# Patient Record
Sex: Female | Born: 1945 | Race: Black or African American | Hispanic: No | Marital: Single | Smoking: Never smoker
Health system: Southern US, Community
[De-identification: ages and names within clinical notes are randomized; demographics above are authoritative.]

## PROBLEM LIST (undated history)

## (undated) DIAGNOSIS — G629 Polyneuropathy, unspecified: Secondary | ICD-10-CM

## (undated) DIAGNOSIS — I10 Essential (primary) hypertension: Secondary | ICD-10-CM

## (undated) DIAGNOSIS — R7303 Prediabetes: Secondary | ICD-10-CM

## (undated) DIAGNOSIS — R29818 Other symptoms and signs involving the nervous system: Secondary | ICD-10-CM

## (undated) DIAGNOSIS — J449 Chronic obstructive pulmonary disease, unspecified: Secondary | ICD-10-CM

## (undated) DIAGNOSIS — R5381 Other malaise: Secondary | ICD-10-CM

## (undated) DIAGNOSIS — K589 Irritable bowel syndrome without diarrhea: Secondary | ICD-10-CM

## (undated) DIAGNOSIS — K7290 Hepatic failure, unspecified without coma: Secondary | ICD-10-CM

## (undated) DIAGNOSIS — I4891 Unspecified atrial fibrillation: Secondary | ICD-10-CM

## (undated) DIAGNOSIS — I739 Peripheral vascular disease, unspecified: Secondary | ICD-10-CM

## (undated) DIAGNOSIS — M1990 Unspecified osteoarthritis, unspecified site: Secondary | ICD-10-CM

## (undated) DIAGNOSIS — G9340 Encephalopathy, unspecified: Secondary | ICD-10-CM

## (undated) DIAGNOSIS — J9622 Acute and chronic respiratory failure with hypercapnia: Secondary | ICD-10-CM

## (undated) HISTORY — DX: Acute and chronic respiratory failure with hypercapnia: J96.22

## (undated) HISTORY — DX: Encephalopathy, unspecified: G93.40

---

## 2018-03-28 ENCOUNTER — Other Ambulatory Visit: Payer: Self-pay | Admitting: Internal Medicine

## 2018-03-28 DIAGNOSIS — Z1231 Encounter for screening mammogram for malignant neoplasm of breast: Secondary | ICD-10-CM

## 2018-05-11 ENCOUNTER — Other Ambulatory Visit: Payer: Self-pay

## 2018-05-11 ENCOUNTER — Inpatient Hospital Stay (HOSPITAL_COMMUNITY)
Admission: EM | Admit: 2018-05-11 | Discharge: 2018-05-19 | DRG: 205 | Disposition: A | Discharge status: Home Health | Payer: Medicare Other | Attending: Internal Medicine | Admitting: Internal Medicine

## 2018-05-11 ENCOUNTER — Encounter (HOSPITAL_COMMUNITY): Payer: Self-pay | Admitting: Emergency Medicine

## 2018-05-11 ENCOUNTER — Emergency Department (HOSPITAL_COMMUNITY): Payer: Medicare Other

## 2018-05-11 DIAGNOSIS — J9622 Acute and chronic respiratory failure with hypercapnia: Secondary | ICD-10-CM | POA: Diagnosis present

## 2018-05-11 DIAGNOSIS — R7989 Other specified abnormal findings of blood chemistry: Secondary | ICD-10-CM | POA: Diagnosis present

## 2018-05-11 DIAGNOSIS — R4701 Aphasia: Secondary | ICD-10-CM | POA: Diagnosis not present

## 2018-05-11 DIAGNOSIS — Z87891 Personal history of nicotine dependence: Secondary | ICD-10-CM

## 2018-05-11 DIAGNOSIS — I4821 Permanent atrial fibrillation: Secondary | ICD-10-CM | POA: Diagnosis present

## 2018-05-11 DIAGNOSIS — I4891 Unspecified atrial fibrillation: Secondary | ICD-10-CM | POA: Diagnosis not present

## 2018-05-11 DIAGNOSIS — E872 Acidosis: Secondary | ICD-10-CM | POA: Diagnosis present

## 2018-05-11 DIAGNOSIS — Z6841 Body Mass Index (BMI) 40.0 and over, adult: Secondary | ICD-10-CM | POA: Diagnosis not present

## 2018-05-11 DIAGNOSIS — L309 Dermatitis, unspecified: Secondary | ICD-10-CM | POA: Diagnosis present

## 2018-05-11 DIAGNOSIS — I081 Rheumatic disorders of both mitral and tricuspid valves: Secondary | ICD-10-CM | POA: Diagnosis present

## 2018-05-11 DIAGNOSIS — I959 Hypotension, unspecified: Secondary | ICD-10-CM | POA: Diagnosis not present

## 2018-05-11 DIAGNOSIS — M7989 Other specified soft tissue disorders: Secondary | ICD-10-CM | POA: Diagnosis not present

## 2018-05-11 DIAGNOSIS — N179 Acute kidney failure, unspecified: Secondary | ICD-10-CM

## 2018-05-11 DIAGNOSIS — I5041 Acute combined systolic (congestive) and diastolic (congestive) heart failure: Secondary | ICD-10-CM | POA: Diagnosis present

## 2018-05-11 DIAGNOSIS — R32 Unspecified urinary incontinence: Secondary | ICD-10-CM | POA: Diagnosis present

## 2018-05-11 DIAGNOSIS — N184 Chronic kidney disease, stage 4 (severe): Secondary | ICD-10-CM | POA: Diagnosis present

## 2018-05-11 DIAGNOSIS — J9621 Acute and chronic respiratory failure with hypoxia: Secondary | ICD-10-CM | POA: Diagnosis present

## 2018-05-11 DIAGNOSIS — I130 Hypertensive heart and chronic kidney disease with heart failure and stage 1 through stage 4 chronic kidney disease, or unspecified chronic kidney disease: Secondary | ICD-10-CM | POA: Diagnosis present

## 2018-05-11 DIAGNOSIS — J9602 Acute respiratory failure with hypercapnia: Secondary | ICD-10-CM

## 2018-05-11 DIAGNOSIS — N183 Chronic kidney disease, stage 3 unspecified: Secondary | ICD-10-CM

## 2018-05-11 DIAGNOSIS — I5043 Acute on chronic combined systolic (congestive) and diastolic (congestive) heart failure: Secondary | ICD-10-CM | POA: Diagnosis not present

## 2018-05-11 DIAGNOSIS — I5023 Acute on chronic systolic (congestive) heart failure: Secondary | ICD-10-CM | POA: Diagnosis not present

## 2018-05-11 DIAGNOSIS — R202 Paresthesia of skin: Secondary | ICD-10-CM | POA: Diagnosis not present

## 2018-05-11 DIAGNOSIS — J9601 Acute respiratory failure with hypoxia: Secondary | ICD-10-CM | POA: Diagnosis not present

## 2018-05-11 DIAGNOSIS — I5021 Acute systolic (congestive) heart failure: Secondary | ICD-10-CM | POA: Diagnosis not present

## 2018-05-11 DIAGNOSIS — Z79899 Other long term (current) drug therapy: Secondary | ICD-10-CM | POA: Diagnosis not present

## 2018-05-11 DIAGNOSIS — R778 Other specified abnormalities of plasma proteins: Secondary | ICD-10-CM | POA: Diagnosis present

## 2018-05-11 DIAGNOSIS — I272 Pulmonary hypertension, unspecified: Secondary | ICD-10-CM | POA: Diagnosis present

## 2018-05-11 DIAGNOSIS — E66813 Obesity, class 3: Secondary | ICD-10-CM | POA: Diagnosis present

## 2018-05-11 DIAGNOSIS — N289 Disorder of kidney and ureter, unspecified: Secondary | ICD-10-CM | POA: Diagnosis not present

## 2018-05-11 DIAGNOSIS — I10 Essential (primary) hypertension: Secondary | ICD-10-CM | POA: Diagnosis not present

## 2018-05-11 DIAGNOSIS — I248 Other forms of acute ischemic heart disease: Secondary | ICD-10-CM | POA: Diagnosis present

## 2018-05-11 DIAGNOSIS — R609 Edema, unspecified: Secondary | ICD-10-CM

## 2018-05-11 DIAGNOSIS — L8990 Pressure ulcer of unspecified site, unspecified stage: Secondary | ICD-10-CM

## 2018-05-11 DIAGNOSIS — E662 Morbid (severe) obesity with alveolar hypoventilation: Secondary | ICD-10-CM | POA: Diagnosis present

## 2018-05-11 DIAGNOSIS — I509 Heart failure, unspecified: Secondary | ICD-10-CM

## 2018-05-11 DIAGNOSIS — R0602 Shortness of breath: Secondary | ICD-10-CM

## 2018-05-11 DIAGNOSIS — R627 Adult failure to thrive: Secondary | ICD-10-CM | POA: Diagnosis present

## 2018-05-11 HISTORY — DX: Morbid (severe) obesity due to excess calories: E66.01

## 2018-05-11 HISTORY — DX: Essential (primary) hypertension: I10

## 2018-05-11 HISTORY — DX: Other symptoms and signs involving the nervous system: R29.818

## 2018-05-11 HISTORY — DX: Other specified abnormal findings of blood chemistry: R79.89

## 2018-05-11 HISTORY — DX: Acute respiratory failure with hypoxia: J96.01

## 2018-05-11 HISTORY — DX: Other malaise: R53.81

## 2018-05-11 LAB — COMPREHENSIVE METABOLIC PANEL
ALT: 30 U/L (ref 0–44)
AST: 46 U/L — ABNORMAL HIGH (ref 15–41)
Albumin: 3.4 g/dL — ABNORMAL LOW (ref 3.5–5.0)
Alkaline Phosphatase: 66 U/L (ref 38–126)
Anion gap: 9 (ref 5–15)
BUN: 36 mg/dL — ABNORMAL HIGH (ref 8–23)
CALCIUM: 9 mg/dL (ref 8.9–10.3)
CO2: 29 mmol/L (ref 22–32)
Chloride: 104 mmol/L (ref 98–111)
Creatinine, Ser: 2.21 mg/dL — ABNORMAL HIGH (ref 0.44–1.00)
GFR calc Af Amer: 25 mL/min — ABNORMAL LOW (ref >60)
GFR calc non Af Amer: 21 mL/min — ABNORMAL LOW (ref >60)
Glucose, Bld: 100 mg/dL — ABNORMAL HIGH (ref 70–99)
Potassium: 4.3 mmol/L (ref 3.5–5.1)
Sodium: 142 mmol/L (ref 135–145)
Total Bilirubin: 1.2 mg/dL (ref 0.3–1.2)
Total Protein: 6.6 g/dL (ref 6.5–8.1)

## 2018-05-11 LAB — CBC WITH DIFFERENTIAL/PLATELET
Abs Immature Granulocytes: 0.04 10*3/uL (ref 0.00–0.07)
Basophils Absolute: 0 10*3/uL (ref 0.0–0.1)
Basophils Relative: 0 %
Eosinophils Absolute: 0 10*3/uL (ref 0.0–0.5)
Eosinophils Relative: 1 %
HCT: 42.2 % (ref 36.0–46.0)
Hemoglobin: 13.1 g/dL (ref 12.0–15.0)
Immature Granulocytes: 1 %
LYMPHS ABS: 0.6 10*3/uL — AB (ref 0.7–4.0)
Lymphocytes Relative: 7 %
MCH: 31.5 pg (ref 26.0–34.0)
MCHC: 31 g/dL (ref 30.0–36.0)
MCV: 101.4 fL — ABNORMAL HIGH (ref 80.0–100.0)
Monocytes Absolute: 0.9 10*3/uL (ref 0.1–1.0)
Monocytes Relative: 12 %
Neutro Abs: 6.3 10*3/uL (ref 1.7–7.7)
Neutrophils Relative %: 79 %
Platelets: 110 10*3/uL — ABNORMAL LOW (ref 150–400)
RBC: 4.16 MIL/uL (ref 3.87–5.11)
RDW: 15.1 % (ref 11.5–15.5)
WBC: 7.9 10*3/uL (ref 4.0–10.5)
nRBC: 0 % (ref 0.0–0.2)

## 2018-05-11 LAB — LIPID PANEL
Cholesterol: 119 mg/dL (ref 0–200)
HDL: 41 mg/dL (ref >40)
LDL CALC: 66 mg/dL (ref 0–99)
Total CHOL/HDL Ratio: 2.9 RATIO
Triglycerides: 62 mg/dL (ref <150)
VLDL: 12 mg/dL (ref 0–40)

## 2018-05-11 LAB — POCT I-STAT 7, (LYTES, BLD GAS, ICA,H+H)
Acid-Base Excess: 4 mmol/L — ABNORMAL HIGH (ref 0.0–2.0)
Bicarbonate: 32.6 mmol/L — ABNORMAL HIGH (ref 20.0–28.0)
Calcium, Ion: 1.25 mmol/L (ref 1.15–1.40)
HCT: 38 % (ref 36.0–46.0)
Hemoglobin: 12.9 g/dL (ref 12.0–15.0)
O2 Saturation: 94 %
POTASSIUM: 4.4 mmol/L (ref 3.5–5.1)
Patient temperature: 97.9
Sodium: 141 mmol/L (ref 135–145)
TCO2: 35 mmol/L — ABNORMAL HIGH (ref 22–32)
pCO2 arterial: 64.8 mmHg — ABNORMAL HIGH (ref 32.0–48.0)
pH, Arterial: 7.307 — ABNORMAL LOW (ref 7.350–7.450)
pO2, Arterial: 77 mmHg — ABNORMAL LOW (ref 83.0–108.0)

## 2018-05-11 LAB — URINALYSIS, ROUTINE W REFLEX MICROSCOPIC
Glucose, UA: NEGATIVE mg/dL
Ketones, ur: NEGATIVE mg/dL
Nitrite: NEGATIVE
Protein, ur: 30 mg/dL — AB
Specific Gravity, Urine: 1.026 (ref 1.005–1.030)
pH: 5 (ref 5.0–8.0)

## 2018-05-11 LAB — TROPONIN I
TROPONIN I: 0.06 ng/mL — AB (ref <0.03)
TROPONIN I: 0.07 ng/mL — AB (ref <0.03)
Troponin I: 0.06 ng/mL (ref <0.03)

## 2018-05-11 LAB — HEMOGLOBIN A1C
Hgb A1c MFr Bld: 6 % — ABNORMAL HIGH (ref 4.8–5.6)
Mean Plasma Glucose: 125.5 mg/dL

## 2018-05-11 LAB — TSH: TSH: 1.166 u[IU]/mL (ref 0.350–4.500)

## 2018-05-11 LAB — BRAIN NATRIURETIC PEPTIDE: B Natriuretic Peptide: 309.8 pg/mL — ABNORMAL HIGH (ref 0.0–100.0)

## 2018-05-11 MED ORDER — ASPIRIN EC 81 MG PO TBEC
81.0000 mg | DELAYED_RELEASE_TABLET | Freq: Every day | ORAL | Status: DC
  Administered 2018-05-12 – 2018-05-19 (×8): 81 mg via ORAL
  Filled 2018-05-11 (×8): qty 1

## 2018-05-11 MED ORDER — ENOXAPARIN SODIUM 40 MG/0.4ML ~~LOC~~ SOLN: 40.0000 mg | SUBCUTANEOUS | Status: DC

## 2018-05-11 MED ORDER — METOPROLOL TARTRATE 25 MG PO TABS
25.0000 mg | ORAL_TABLET | Freq: Two times a day (BID) | ORAL | Status: DC
  Administered 2018-05-11 – 2018-05-14 (×5): 25 mg via ORAL
  Filled 2018-05-11 (×6): qty 1

## 2018-05-11 MED ORDER — NYSTATIN 100000 UNIT/GM EX CREA
TOPICAL_CREAM | Freq: Two times a day (BID) | CUTANEOUS | Status: DC
  Administered 2018-05-12: 1 via TOPICAL
  Administered 2018-05-12: 03:00:00 via TOPICAL
  Administered 2018-05-13: 1 via TOPICAL
  Administered 2018-05-14 – 2018-05-18 (×10): via TOPICAL
  Filled 2018-05-11 (×2): qty 15

## 2018-05-11 MED ORDER — DOCUSATE SODIUM 100 MG PO CAPS: 200.0000 mg | ORAL_CAPSULE | Freq: Every day | ORAL | Status: DC | PRN

## 2018-05-11 MED ORDER — ENOXAPARIN SODIUM 300 MG/3ML IJ SOLN
200.0000 mg | Freq: Two times a day (BID) | INTRAMUSCULAR | Status: DC
  Administered 2018-05-11 – 2018-05-12 (×2): 200 mg via SUBCUTANEOUS
  Filled 2018-05-11 (×4): qty 2

## 2018-05-11 MED ORDER — ALBUTEROL SULFATE (2.5 MG/3ML) 0.083% IN NEBU: 2.5000 mg | INHALATION_SOLUTION | RESPIRATORY_TRACT | Status: DC | PRN

## 2018-05-11 MED ORDER — DILTIAZEM HCL-DEXTROSE 100-5 MG/100ML-% IV SOLN (PREMIX)
5.0000 mg/h | INTRAVENOUS | Status: DC
  Administered 2018-05-11: 10 mg/h via INTRAVENOUS
  Administered 2018-05-11 – 2018-05-12 (×2): 5 mg/h via INTRAVENOUS
  Filled 2018-05-11 (×3): qty 100

## 2018-05-11 MED ORDER — SODIUM CHLORIDE 0.9 % IV BOLUS: 1000.0000 mL | Freq: Once | INTRAVENOUS | Status: DC

## 2018-05-11 MED ORDER — ACETAMINOPHEN 500 MG PO TABS
1000.0000 mg | ORAL_TABLET | Freq: Four times a day (QID) | ORAL | Status: DC | PRN
  Administered 2018-05-16 – 2018-05-17 (×2): 1000 mg via ORAL
  Filled 2018-05-11 (×2): qty 2

## 2018-05-11 MED ORDER — HYDROCODONE-ACETAMINOPHEN 5-325 MG PO TABS
1.0000 | ORAL_TABLET | ORAL | Status: DC | PRN
  Administered 2018-05-18 – 2018-05-19 (×3): 2 via ORAL
  Filled 2018-05-11 (×3): qty 2

## 2018-05-11 NOTE — H&P (Signed)
History and Physical    [name]:[id number] DOB: Apr 30, 1945 DOA: 05/11/2018  PCP: Jean Stewart, No Pcp Per , Jean Stewart says she just made a pcp. Jean Stewart coming from: home   I have personally briefly reviewed Jean Stewart's old medical records available.   Chief Complaint: Increasing leg swelling since 1 week, worse now.  HPI: Jean Stewart is a 73 y.o. female with medical history significant of hypertension, limited history who presents to the emergency room with increasing leg swelling and pain for 1 week.  Jean Stewart is poor historian.  According to the Jean Stewart, she used to live in [place] and moved to [place] last June.  She states she has history of hypertension and takes medicine.  Denies any history of use of oxygen, sleep apnea or use of CPAP.  Denies any history of heart problems or A. fib.  Jean Stewart called EMS because she could not walk and she lives by herself and her legs are increasingly swollen.  She does not know what is her baseline weight.Her only complaint is her left thigh hurts, her legs are swollen.  Today she had difficulty walking so she called EMS.  At baseline she walks with a walker.  When EMS found her, she was in A. fib with RVR. Jean Stewart denies any chest pain, denies any palpitations, denies any shortness of breath, cough, cold fever or recent flulike symptoms.  Jean Stewart denies any nausea or vomiting.  Denies any diarrhea or dysuria.  Her daughter noted that at times she looked sleepy and was talking gibberish. ED Course: Jean Stewart was found in the ER with A. fib with RVR, heart rate as high as 129, blood pressure is low normal 97/57.  She was saturating 88% on room air and was put on 4 L of oxygen.  Troponin 0 0.06.  Creatinine 2.21, baseline unknown.  A chest x-ray shows cardiomegaly.  Twelve-lead EKG shows A. fib with RVR.Jean Stewart was started on Cardizem infusion in the ER.  A blood gas was ordered and reviewed.  pH 7.3 with CO2 of 65, bicarb is 33.  Jean Stewart has  severe systemic illness, A. fib with RVR, will need cardiac related investigations, investigation for hypoxia and hypercapnia.  She will require stay in the hospital for more than 2 midnights.  Review of Systems: As per HPI otherwise 10 point review of systems negative.  Poor historian.   Past Medical History:  Diagnosis Date  . Hypertension     History reviewed. No pertinent surgical history.   reports that she has quit smoking. She has never used smokeless tobacco. She reports previous alcohol use. She reports that she does not use drugs.  Not on File  History reviewed. No pertinent family history.  Denies any family history of cardiac illnesses.   Prior to Admission medications   Medication Sig Start Date End Date Taking? Authorizing Provider  acetaminophen (TYLENOL) 500 MG tablet Take 1,000 mg by mouth every 6 (six) hours as needed for mild pain.   Yes [provider]  amLODipine (NORVASC) 5 MG tablet Take 5 mg by mouth every morning. 03/21/18  Yes [provider]  docusate sodium (COLACE) 100 MG capsule Take 200 mg by mouth daily as needed for mild constipation.   Yes [provider]  hydrocortisone 2.5 % cream Apply 1 application topically 2 (two) times daily as needed for itching. 01/20/18  Yes [provider]    Physical Exam: Vitals:   05/11/18 1430 05/11/18 1500 05/11/18 1630 05/11/18 1730  BP: 108/64 121/83 Marland Kitchen)  114/59 (!) 97/57  Pulse:   (!) 112 (!) 129  Resp:   20 (!) 21  Temp:      TempSrc:      SpO2:   (!) 88% 98%  Weight:      Height:        Constitutional: NAD, calm, comfortable Vitals:   05/11/18 1430 05/11/18 1500 05/11/18 1630 05/11/18 1730  BP: 108/64 121/83 (!) 114/59 (!) 97/57  Pulse:   (!) 112 (!) 129  Resp:   20 (!) 21  Temp:      TempSrc:      SpO2:   (!) 88% 98%  Weight:      Height:       Eyes: PERRL, lids and conjunctivae normal Chronically sick looking.  On 4 L oxygen.  Sleepy but can be  awakened. ENMT: Mucous membranes are moist. Posterior pharynx clear of any exudate or lesions.Normal dentition.  Neck: normal, supple, no masses, no thyromegaly, cannot appreciate JVD because of obesity. Respiratory: clear to auscultation bilaterally, no wheezing, no crackles. Normal respiratory effort. No accessory muscle use.  Difficult to appreciate with auscultation. Cardiovascular: Irregularly irregular rate and rhythm, no murmurs / rubs / gallops. No extremity edema. 2+ pedal pulses. No carotid bruits.  Abdomen: no tenderness, no masses palpated. No hepatosplenomegaly. Bowel sounds positive.  Morbidly obese, pendulous with a skin changes on the pannus, no appreciable abscess or collection. Musculoskeletal: Obese extremities.  Tense and tender bilateral legs and thighs, she has a skin rashes with no appreciable underlying abscesses all over the inner thigh and perineum. Skin: Skin rashes.  No induration Neurologic: CN 2-12 grossly intact. Sensation intact, DTR normal. Strength 5/5 in all 4.  Psychiatric: Normal judgment and insight. Alert and oriented x 3.  Flat affect.  Sleepy.    Labs on Admission: I have personally reviewed following labs and imaging studies  CBC: Recent Labs  Lab 05/11/18 1325 05/11/18 1735  WBC 7.9  --   NEUTROABS 6.3  --   HGB 13.1 12.9  HCT 42.2 38.0  MCV 101.4*  --   PLT 110*  --    Basic Metabolic Panel: Recent Labs  Lab 05/11/18 1325 05/11/18 1735  NA 142 141  K 4.3 4.4  CL 104  --   CO2 29  --   GLUCOSE 100*  --   BUN 36*  --   CREATININE 2.21*  --   CALCIUM 9.0  --    GFR: Estimated Creatinine Clearance: 39.5 mL/min (A) (by C-G formula based on SCr of 2.21 mg/dL (H)). Liver Function Tests: Recent Labs  Lab 05/11/18 1325  AST 46*  ALT 30  ALKPHOS 66  BILITOT 1.2  PROT 6.6  ALBUMIN 3.4*   No results for input(s): LIPASE, AMYLASE in the last 168 hours. No results for input(s): AMMONIA in the last 168 hours. Coagulation  Profile: No results for input(s): INR, PROTIME in the last 168 hours. Cardiac Enzymes: Recent Labs  Lab 05/11/18 1325  TROPONINI 0.06*   BNP (last 3 results) No results for input(s): PROBNP in the last 8760 hours. HbA1C: No results for input(s): HGBA1C in the last 72 hours. CBG: No results for input(s): GLUCAP in the last 168 hours. Lipid Profile: No results for input(s): CHOL, HDL, LDLCALC, TRIG, CHOLHDL, LDLDIRECT in the last 72 hours. Thyroid Function Tests: Recent Labs    05/11/18 1352  TSH 1.166   Anemia Panel: No results for input(s): VITAMINB12, FOLATE, FERRITIN, TIBC, IRON, RETICCTPCT in the last  72 hours. Urine analysis: No results found for: COLORURINE, APPEARANCEUR, LABSPEC, [place], GLUCOSEU, HGBUR, BILIRUBINUR, KETONESUR, PROTEINUR, UROBILINOGEN, NITRITE, LEUKOCYTESUR  Radiological Exams on Admission: Dg Chest Port 1 View  Result Date: 05/11/2018 CLINICAL DATA:  Shortness of breath. Hypertension. EXAM: PORTABLE CHEST 1 VIEW COMPARISON:  None. FINDINGS: Mildly degraded exam due to AP portable technique and Jean Stewart body habitus. Apical lordotic positioning. Midline trachea. Cardiomegaly accentuated by AP portable technique. Mild left hemidiaphragm elevation. No right and no definite left pleural effusion. Numerous leads and wires project over the chest. No pneumothorax. No congestive failure. Relatively low lung volumes. This accentuates the pulmonary interstitium. IMPRESSION: Cardiomegaly and low lung volumes, without acute disease. Electronically Signed   By: Abigail Miyamoto M.D.   On: 05/11/2018 13:47    EKG: Independently reviewed.  A. fib with RVR.  Assessment/Plan Principal Problem:   Atrial fibrillation with RVR (HCC) Active Problems:   Obesity, Class III, BMI 40-49.9 (morbid obesity) (HCC)   Essential hypertension   Renal insufficiency   New onset a-fib ([place])   Acute respiratory failure with hypoxia and hypercapnia (HCC)   Elevated troponin     1.  Acute  respiratory failure with hypoxia and hypercapnia: Suspect multifactorial and undiagnosed underlying sleep apnea.  Also possible congestive heart failure with new onset A. Fib. Jean Stewart is uncompensated respiratory acidosis.  She probably has severe sleep apnea. We will admit to stepdown unit.  Will start Jean Stewart on BiPAP, repeat ABG in the morning.  Depending upon the progress, she will need assist device at home, will need referral for sleep apnea study on discharge. May need supplemental oxygen to go home.  2.  A. fib with RVR: Probably new onset.  May have underlying chronic A. fib.  Continue diltiazem.  Discontinue amlodipine, start on metoprolol.  Titrate diltiazem to keep heart rate less than 100.  Jean Stewart is mostly asymptomatic. Has borderline elevated troponins, probably due to RVR.  Currently without any chest pain. We will cycle troponins and EKG.  Will check echocardiogram to rule out acute congestive heart failure.  Blood pressures are soft, she will not tolerate any diuresis at this time. Depends upon findings, may need cardiology evaluation.  3.  Renal insufficiency: Unknown whether acute or chronic.  No previous records available.  Will recheck in the morning to ensure stabilization. Will check A1c and lipid profile for risk stratification.  4.  Morbid obesity: As stated above.  Will need sleep apnea evaluation.  5.  Bilateral leg swelling: Recently worsening leg swelling and tenderness.  Also with new onset A. fib.  Will investigate for presence of DVT.  Her creatinine is 2, is not a candidate for CTA for PE study.  Will send d-dimer and order leg ultrasound. With her A. fib and suspected DVT, will start Jean Stewart on Lovenox renally dosed.  6.  Dermatitis: Suspect dermatitis.  Less likely underlying soft tissue infection.  Will start with nystatin powder.  Will consult wound care.    DVT prophylaxis: Lovenox Code Status: Full code Family Communication: No family at  bedside Disposition Plan: Home with home health care Consults called: None Admission status: Inpatient to stepdown unit   Barb Merino MD [facility] Pager [phone]  If 7PM-7AM, please contact night-coverage [email or web address] Password [facility]  05/11/2018, 6:17 PM

## 2018-05-11 NOTE — ED Notes (Signed)
External catheter placed on pt

## 2018-05-11 NOTE — ED Notes (Signed)
Pt far more awake and interactive than previously assessed. Pt is fully awake, alert, and oriented at this time. Respiratory at bedside.

## 2018-05-11 NOTE — Progress Notes (Signed)
RT assessed pt for need of BIPAP at this time (order is PRN) pt is not in need of at this time. RT will continue pts need for BIPAP and pt was instructed to call if she feels like she needs BIPAP at any time. RT will continue to monitor. MD aware.

## 2018-05-11 NOTE — Progress Notes (Signed)
ANTICOAGULATION CONSULT NOTE - Initial Consult  Pharmacy Consult for lovenox Indication: atrial fibrillation and f/u DVT  Not on File  Patient Measurements: Height: 5\' 4"  (162.6 cm) Weight: (!) 436 lb (197.8 kg) IBW/kg (Calculated) : 54.7  Vital Signs: Temp: 97.9 F (36.6 C) (03/06 1258) Temp Source: Oral (03/06 1258) BP: 97/57 (03/06 1730) Pulse Rate: 129 (03/06 1730)  Labs: Recent Labs    05/11/18 1325 05/11/18 1735  HGB 13.1 12.9  HCT 42.2 38.0  PLT 110*  --   CREATININE 2.21*  --   TROPONINI 0.06*  --     Estimated Creatinine Clearance: 39.5 mL/min (A) (by C-G formula based on SCr of 2.21 mg/dL (H)).   Medical History: Past Medical History:  Diagnosis Date  . Hypertension    Assessment: 58 yof presented to the ED with afib and possible DVT. To start lovenox. She is not on anticoagulation PTA.  Baseline Hgb is WNL and platelets are slightly low. Pt also with elevated Scr. Pt is also obese. Lovenox will require close monitoring.   Goal of Therapy:  Anti-Xa level 0.6-1 units/ml 4hrs after LMWH dose given Monitor platelets by anticoagulation protocol: Yes   Plan:  Lovenox 200mg  SQ Q12H F/u renal fxn, S&S of bleeding CBC Q72H  Lanae Federer, Rande Lawman 05/11/2018,7:03 PM

## 2018-05-11 NOTE — ED Triage Notes (Signed)
Pt arrives to ED from home with complaints of increased leg swelling over this past week to the point she cannot walk. Pt was found to be in Afib w/ RVR by EMS. Pt has o hx of afib. Pt only complains of lower extremity pain.

## 2018-05-11 NOTE — ED Notes (Signed)
ED TO INPATIENT HANDOFF REPORT  ED Nurse Name and Phone #: mike [phone]  S Name/Age/Gender Jean Stewart 73 y.o. female Room/Bed: 045C/045C  Code Status   Code Status: Not on file  Home/SNF/Other Home Patient oriented to: self, place, time and situation Is this baseline? Yes   Triage Complete: Triage complete  Chief Complaint adema and afib  Triage Note Pt arrives to ED from home with complaints of increased leg swelling over this past week to the point she cannot walk. Pt was found to be in Afib w/ RVR by EMS. Pt has o hx of afib. Pt only complains of lower extremity pain.    Allergies Not on File  Level of Care/Admitting Diagnosis ED Disposition    ED Disposition Condition [facility] Area: [place] [[id number]]  Level of Care: Progressive [102]  Diagnosis: New onset a-fib [facility]) [id number]  Admitting Physician: Barb Merino [[id number]]  Attending Physician: Barb Merino [[id number]]  Estimated length of stay: past midnight tomorrow  Certification:: I certify this patient will need inpatient services for at least 2 midnights  PT Class (Do Not Modify): Inpatient [101]  PT Acc Code (Do Not Modify): Private [1]       B Medical/Surgery History Past Medical History:  Diagnosis Date  . Hypertension    History reviewed. No pertinent surgical history.   A IV Location/Drains/Wounds Patient Lines/Drains/Airways Status   Active Line/Drains/Airways    Name:   Placement date:   Placement time:   Site:   Days:   Peripheral IV 05/11/18 Left Antecubital   05/11/18    1302    Antecubital   less than 1          Intake/Output Last 24 hours No intake or output data in the 24 hours ending 05/11/18 1923  Labs/Imaging Results for orders placed or performed during the hospital encounter of 05/11/18 (from the past 48 hour(s))  CBC with Differential     Status: Abnormal   Collection Time: 05/11/18  1:25 PM  Result Value Ref Range   WBC 7.9  4.0 - 10.5 K/uL   RBC 4.16 3.87 - 5.11 MIL/uL   Hemoglobin 13.1 12.0 - 15.0 g/dL   HCT 42.2 36.0 - 46.0 %   MCV 101.4 (H) 80.0 - 100.0 fL   MCH 31.5 26.0 - 34.0 pg   MCHC 31.0 30.0 - 36.0 g/dL   RDW 15.1 11.5 - 15.5 %   Platelets 110 (L) 150 - 400 K/uL    Comment: REPEATED TO VERIFY PLATELET COUNT CONFIRMED BY SMEAR SPECIMEN CHECKED FOR CLOTS Immature Platelet Fraction may be clinically indicated, consider ordering this additional test [id number]    nRBC 0.0 0.0 - 0.2 %   Neutrophils Relative % 79 %   Neutro Abs 6.3 1.7 - 7.7 K/uL   Lymphocytes Relative 7 %   Lymphs Abs 0.6 (L) 0.7 - 4.0 K/uL   Monocytes Relative 12 %   Monocytes Absolute 0.9 0.1 - 1.0 K/uL   Eosinophils Relative 1 %   Eosinophils Absolute 0.0 0.0 - 0.5 K/uL   Basophils Relative 0 %   Basophils Absolute 0.0 0.0 - 0.1 K/uL   Immature Granulocytes 1 %   Abs Immature Granulocytes 0.04 0.00 - 0.07 K/uL    Comment: Performed at [facility], [address] [place], [place] [address]  Brain natriuretic peptide     Status: Abnormal   Collection Time: 05/11/18  1:25 PM  Result Value Ref Range  B Natriuretic Peptide 309.8 (H) 0.0 - 100.0 pg/mL    Comment: Performed at [facility] [address] [place], [place] [address]  Comprehensive metabolic panel     Status: Abnormal   Collection Time: 05/11/18  1:25 PM  Result Value Ref Range   Sodium 142 135 - 145 mmol/L   Potassium 4.3 3.5 - 5.1 mmol/L   Chloride 104 98 - 111 mmol/L   CO2 29 22 - 32 mmol/L   Glucose, Bld 100 (H) 70 - 99 mg/dL   BUN 36 (H) 8 - 23 mg/dL   Creatinine, Ser 2.21 (H) 0.44 - 1.00 mg/dL   Calcium 9.0 8.9 - 10.3 mg/dL   Total Protein 6.6 6.5 - 8.1 g/dL   Albumin 3.4 (L) 3.5 - 5.0 g/dL   AST 46 (H) 15 - 41 U/L   ALT 30 0 - 44 U/L   Alkaline Phosphatase 66 38 - 126 U/L   Total Bilirubin 1.2 0.3 - 1.2 mg/dL   GFR calc non Af Amer 21 (L) >60 mL/min   GFR calc Af Amer 25 (L) >60 mL/min   Anion gap 9 5 - 15    Comment: Performed  at [facility], [place] [address] [place], [place] [address]  Troponin I -     Status: Abnormal   Collection Time: 05/11/18  1:25 PM  Result Value Ref Range   Troponin I 0.06 (HH) <0.03 ng/mL    Comment: CRITICAL RESULT CALLED TO, READ BACK BY AND VERIFIED WITH: M.Reisa Coppola,RN [id number] 05/11/2018 CLARK,S Performed at [facility] [address] [place],  [address]   TSH     Status: None   Collection Time: 05/11/18  1:52 PM  Result Value Ref Range   TSH 1.166 0.350 - 4.500 uIU/mL    Comment: Performed by a 3rd Generation assay with a functional sensitivity of <=0.01 uIU/mL. Performed at [facility], [place] [address], [place], [place] [address]   I-STAT 7, (LYTES, BLD GAS, ICA, H+H)     Status: Abnormal   Collection Time: 05/11/18  5:35 PM  Result Value Ref Range   pH, Arterial 7.307 (L) 7.350 - 7.450   pCO2 arterial 64.8 (H) 32.0 - 48.0 mmHg   pO2, Arterial 77.0 (L) 83.0 - 108.0 mmHg   Bicarbonate 32.6 (H) 20.0 - 28.0 mmol/L   TCO2 35 (H) 22 - 32 mmol/L   O2 Saturation 94.0 %   Acid-Base Excess 4.0 (H) 0.0 - 2.0 mmol/L   Sodium 141 135 - 145 mmol/L   Potassium 4.4 3.5 - 5.1 mmol/L   Calcium, Ion 1.25 1.15 - 1.40 mmol/L   HCT 38.0 36.0 - 46.0 %   Hemoglobin 12.9 12.0 - 15.0 g/dL   Patient temperature 97.9 F    Collection site RADIAL, ALLEN'S TEST ACCEPTABLE    Sample type ARTERIAL    Comment NOTIFIED PHYSICIAN    Dg Chest Port 1 View  Result Date: 05/11/2018 CLINICAL DATA:  Shortness of breath. Hypertension. EXAM: PORTABLE CHEST 1 VIEW COMPARISON:  None. FINDINGS: Mildly degraded exam due to AP portable technique and patient body habitus. Apical lordotic positioning. Midline trachea. Cardiomegaly accentuated by AP portable technique. Mild left hemidiaphragm elevation. No right and no definite left pleural effusion. Numerous leads and wires project over the chest. No pneumothorax. No congestive failure. Relatively low lung volumes. This accentuates the pulmonary  interstitium. IMPRESSION: Cardiomegaly and low lung volumes, without acute disease. Electronically Signed   By: Marylyn Ishihara  Jobe Igo M.D.   On: 05/11/2018 13:47    Pending Labs Unresulted Labs (From admission, onward)    Start     Ordered   05/12/18 0500  Blood gas, arterial  Tomorrow morning,   R     05/11/18 1816   05/12/18 0973  Basic metabolic panel  Tomorrow morning,   R     05/11/18 1904   05/12/18 0500  CBC  Tomorrow morning,   R     05/11/18 1904   05/11/18 1714  Blood gas, arterial  Once,   R     05/11/18 1713   05/11/18 1711  Hemoglobin A1c  Once,   R     05/11/18 1710   05/11/18 1711  Lipid panel  Once,   R     05/11/18 1710   05/11/18 1710  Troponin I - Now Then Q6H  Now then every 6 hours,   R     05/11/18 1710   05/11/18 1525  Urinalysis, Routine w reflex microscopic  Once,   R     05/11/18 1524   Signed and Held  Basic metabolic panel  Tomorrow morning,   R     Signed and Held   Signed and Held  CBC  Tomorrow morning,   R     Signed and Held          Vitals/Pain Today's Vitals   05/11/18 1430 05/11/18 1500 05/11/18 1630 05/11/18 1730  BP: 108/64 121/83 (!) 114/59 (!) 97/57  Pulse:   (!) 112 (!) 129  Resp:   20 (!) 21  Temp:      TempSrc:      SpO2:   (!) 88% 98%  Weight:      Height:      PainSc:        Isolation Precautions No active isolations  Medications Medications  diltiazem (CARDIZEM) 100 mg in dextrose 5% 146mL (1 mg/mL) infusion (10 mg/hr Intravenous Rate/Dose Change 05/11/18 1358)  sodium chloride 0.9 % bolus 1,000 mL (has no administration in time range)  metoprolol tartrate (LOPRESSOR) tablet 25 mg (has no administration in time range)  nystatin cream (MYCOSTATIN) (has no administration in time range)  enoxaparin (LOVENOX) injection 200 mg (has no administration in time range)    Mobility non-ambulatory High fall risk   Focused Assessments Pulmonary Assessment Handoff:  Lung sounds:   O2 Device: Nasal Cannula O2 Flow Rate (L/min): 4  L/min      R Recommendations: See Admitting Provider Note  Report given to:   Additional Notes: none

## 2018-05-11 NOTE — ED Provider Notes (Signed)
[facility] EMERGENCY DEPARTMENT Provider Note   CSN: [id number] Arrival date & time: 05/11/18  1255    History   Chief Complaint Chief Complaint  Patient presents with  . Atrial Fibrillation  . Leg Swelling    HPI Jean Stewart is a 73 y.o. female who presents with left thigh pain.  Past medical history significant for morbid obesity and hypertension.  Patient is a poor historian but states that she moved here from [place] in June.  She lives by herself and her daughter is in [place] in the area.  She is unsure of when her leg started hurting but estimates it is been over the past week and has been gradually worsening.  Today she had difficulty walking and walking so her daughter called EMS.  She normally can ambulate with a walker.  Patient was also noted to be in A. fib with RVR when EMS arrived.  She was placed on 4 L of oxygen for comfort.  She does not have a history of this that she knows of.  She denies any fever, chest pain, shortness of breath, cough, abdominal pain, nausea, vomiting, diarrhea, dysuria.  Her leg hurts in the upper thigh area.  She also has some right shoulder pain when she moves her shoulder. Her daughter is at bedside and states that she has had a rash on her leg for a couple days and they are significantly more swollen than usual. She also has noted that she's been talking "gibberish" at times. She has been seeing Dr. Marvel Plan at [facility].  [phone] Jean Stewart (daughter)   HPI  Past Medical History:  Diagnosis Date  . Hypertension     There are no active problems to display for this patient.   History reviewed. No pertinent surgical history.   OB History   No obstetric history on file.      Home Medications    Prior to Admission medications   Not on File    Family History History reviewed. No pertinent family history.  Social History Social History   Tobacco Use  . Smoking status: Former Research scientist (life sciences)  .  Smokeless tobacco: Never Used  Substance Use Topics  . Alcohol use: Not Currently  . Drug use: Never     Allergies   Patient has no allergy information on record.   Review of Systems Review of Systems  Constitutional: Negative for fever.  Respiratory: Negative for cough and shortness of breath.   Cardiovascular: Negative for chest pain.  Gastrointestinal: Negative for abdominal pain, diarrhea, nausea and vomiting.  Genitourinary: Negative for dysuria.  Musculoskeletal: Positive for arthralgias and myalgias.  Skin: Positive for rash. Negative for wound.  All other systems reviewed and are negative.    Physical Exam Updated Vital Signs BP (!) 113/100 (BP Location: Left Arm)   Pulse (!) 122   Temp 97.9 F (36.6 C) (Oral)   Resp (!) 25   Ht 5\' 4"  (1.626 m)   Wt (!) 197.8 kg   SpO2 100%   BMI 74.84 kg/m   Physical Exam Vitals signs and nursing note reviewed.  Constitutional:      General: She is not in acute distress.    Appearance: She is well-developed. She is obese. She is not ill-appearing.     Comments: Calm, cooperative.  Pleasant. Morbidly obese  HENT:     Head: Normocephalic and atraumatic.  Eyes:     General: No scleral icterus.       Right eye:  No discharge.        Left eye: No discharge.     Conjunctiva/sclera: Conjunctivae normal.     Pupils: Pupils are equal, round, and reactive to light.  Neck:     Musculoskeletal: Normal range of motion.  Cardiovascular:     Rate and Rhythm: Tachycardia present. Rhythm regularly irregular.     Pulses: Normal pulses.  Pulmonary:     Effort: Pulmonary effort is normal. No respiratory distress.     Breath sounds: Normal breath sounds.  Abdominal:     General: There is no distension.     Palpations: Abdomen is soft.     Tenderness: There is no abdominal tenderness.  Musculoskeletal:     Right lower leg: Edema present.     Left lower leg: Edema present.  Skin:    General: Skin is warm and dry.     Findings:  Rash (interigo of the groin and pannus. Possible shingles rash over the left lateral thigh) present.  Neurological:     Mental Status: She is alert and oriented to person, place, and time.  Psychiatric:        Mood and Affect: Mood normal.        Behavior: Behavior normal.      ED Treatments / Results  Labs (all labs ordered are listed, but only abnormal results are displayed) Labs Reviewed  CBC WITH DIFFERENTIAL/PLATELET - Abnormal; Notable for the following components:      Result Value   MCV 101.4 (*)    Platelets 110 (*)    Lymphs Abs 0.6 (*)    All other components within normal limits  COMPREHENSIVE METABOLIC PANEL - Abnormal; Notable for the following components:   Glucose, Bld 100 (*)    BUN 36 (*)    Creatinine, Ser 2.21 (*)    Albumin 3.4 (*)    AST 46 (*)    GFR calc non Af Amer 21 (*)    GFR calc Af Amer 25 (*)    All other components within normal limits  TROPONIN I - Abnormal; Notable for the following components:   Troponin I 0.06 (*)    All other components within normal limits  TSH  BRAIN NATRIURETIC PEPTIDE  URINALYSIS, ROUTINE W REFLEX MICROSCOPIC    EKG EKG Interpretation  Date/Time:  Friday May 11 2018 12:57:23 EST Ventricular Rate:  149 PR Interval:    QRS Duration: 84 QT Interval:  297 QTC Calculation: 477 R Axis:   16 Text Interpretation:  Atrial fibrillation with rapid V-rate Low voltage, extremity and precordial leads Borderline T wave abnormalities Confirmed by Davonna Belling [phone]) on 05/11/2018 1:01:58 PM   Radiology Dg Chest Port 1 View  Result Date: 05/11/2018 CLINICAL DATA:  Shortness of breath. Hypertension. EXAM: PORTABLE CHEST 1 VIEW COMPARISON:  None. FINDINGS: Mildly degraded exam due to AP portable technique and patient body habitus. Apical lordotic positioning. Midline trachea. Cardiomegaly accentuated by AP portable technique. Mild left hemidiaphragm elevation. No right and no definite left pleural effusion. Numerous  leads and wires project over the chest. No pneumothorax. No congestive failure. Relatively low lung volumes. This accentuates the pulmonary interstitium. IMPRESSION: Cardiomegaly and low lung volumes, without acute disease. Electronically Signed   By: Abigail Miyamoto M.D.   On: 05/11/2018 13:47    Procedures Procedures (including critical care time)  CRITICAL CARE Performed by: Recardo Evangelist   Total critical care time: 40 minutes  Critical care time was exclusive of separately billable procedures and treating  other patients.  Critical care was necessary to treat or prevent imminent or life-threatening deterioration.  Critical care was time spent personally by me on the following activities: development of treatment plan with patient and/or surrogate as well as nursing, discussions with consultants, evaluation of patient's response to treatment, examination of patient, obtaining history from patient or surrogate, ordering and performing treatments and interventions, ordering and review of laboratory studies, ordering and review of radiographic studies, pulse oximetry and re-evaluation of patient's condition.   Medications Ordered in ED Medications  diltiazem (CARDIZEM) 100 mg in dextrose 5% 125mL (1 mg/mL) infusion (10 mg/hr Intravenous Rate/Dose Change 05/11/18 1358)  sodium chloride 0.9 % bolus 1,000 mL (has no administration in time range)     Initial Impression / Assessment and Plan / ED Course  I have reviewed the triage vital signs and the nursing notes.  Pertinent labs & imaging results that were available during my care of the patient were reviewed by me and considered in my medical decision making (see chart for details).  73 year old female presents with L leg pain, leg swelling and A.fib with RVR and new onset CHF. Heart rate is 120-130. BP is somewhat soft therefore she was given 1L fluid. She is morbidly obese on exam making it somewhat difficult to examine her. She has a  rash in her groin area that looks like interigo but also has an area on her lateral thigh that looks like shingles. EKG is A.fib with RVR. Will obtain labs, CXR. Diltiazem drip was started. She has a Chadsvasc score of 3  CBC is remarkable for high MCV without evidence of anemia. CMP is remarkable for elevated BUN/SCR (36/2.2) and I do not have anything to compare this to. TSH is normal. CXR shows low lung volumes. Trop is .06 and BNP is 309. Suspect this is from demand ischemia as pt denies CP or SOB. Daughter is at bedside now and states that she has had some garbled speech today and is concerned she may have a UTI. Will add CT head and UA.   Discussed with Dr. Sloan Leiter who will admit.  Final Clinical Impressions(s) / ED Diagnoses   Final diagnoses:  Atrial fibrillation with rapid ventricular response (HCC)  Peripheral edema  Acute congestive heart failure, unspecified heart failure type ([place])  Morbid obesity (HCC)  Elevated serum creatinine    ED Discharge Orders    None       Recardo Evangelist, PA-C 05/11/18 1640    Davonna Belling, MD 05/15/18 0003

## 2018-05-11 NOTE — ED Notes (Addendum)
EDP notified of elevated troponin.  

## 2018-05-12 ENCOUNTER — Inpatient Hospital Stay (HOSPITAL_COMMUNITY): Payer: Medicare Other

## 2018-05-12 DIAGNOSIS — L8990 Pressure ulcer of unspecified site, unspecified stage: Secondary | ICD-10-CM

## 2018-05-12 DIAGNOSIS — M7989 Other specified soft tissue disorders: Secondary | ICD-10-CM

## 2018-05-12 LAB — BASIC METABOLIC PANEL
ANION GAP: 15 (ref 5–15)
BUN: 40 mg/dL — ABNORMAL HIGH (ref 8–23)
CO2: 24 mmol/L (ref 22–32)
Calcium: 8.8 mg/dL — ABNORMAL LOW (ref 8.9–10.3)
Chloride: 102 mmol/L (ref 98–111)
Creatinine, Ser: 1.99 mg/dL — ABNORMAL HIGH (ref 0.44–1.00)
GFR calc Af Amer: 28 mL/min — ABNORMAL LOW (ref >60)
GFR calc non Af Amer: 24 mL/min — ABNORMAL LOW (ref >60)
GLUCOSE: 89 mg/dL (ref 70–99)
Potassium: 4.7 mmol/L (ref 3.5–5.1)
Sodium: 141 mmol/L (ref 135–145)

## 2018-05-12 LAB — BLOOD GAS, ARTERIAL
Acid-Base Excess: 4.9 mmol/L — ABNORMAL HIGH (ref 0.0–2.0)
Bicarbonate: 31.1 mmol/L — ABNORMAL HIGH (ref 20.0–28.0)
Drawn by: 350431
O2 Content: 2 L/min
O2 SAT: 93.8 %
PCO2 ART: 65.8 mmHg — AB (ref 32.0–48.0)
PO2 ART: 76.7 mmHg — AB (ref 83.0–108.0)
Patient temperature: 98.2
pH, Arterial: 7.295 — ABNORMAL LOW (ref 7.350–7.450)

## 2018-05-12 LAB — CBC
HCT: 41.8 % (ref 36.0–46.0)
Hemoglobin: 12.9 g/dL (ref 12.0–15.0)
MCH: 31.2 pg (ref 26.0–34.0)
MCHC: 30.9 g/dL (ref 30.0–36.0)
MCV: 101.2 fL — ABNORMAL HIGH (ref 80.0–100.0)
PLATELETS: 105 10*3/uL — AB (ref 150–400)
RBC: 4.13 MIL/uL (ref 3.87–5.11)
RDW: 15.1 % (ref 11.5–15.5)
WBC: 8.4 10*3/uL (ref 4.0–10.5)
nRBC: 0 % (ref 0.0–0.2)

## 2018-05-12 LAB — ECHOCARDIOGRAM COMPLETE
Height: 64 in
Weight: 6976 oz

## 2018-05-12 LAB — HEPARIN LEVEL (UNFRACTIONATED): Heparin Unfractionated: 1.3 IU/mL — ABNORMAL HIGH (ref 0.30–0.70)

## 2018-05-12 LAB — TROPONIN I: Troponin I: 0.07 ng/mL (ref <0.03)

## 2018-05-12 MED ORDER — FUROSEMIDE 10 MG/ML IJ SOLN
20.0000 mg | Freq: Two times a day (BID) | INTRAMUSCULAR | Status: DC
  Administered 2018-05-12 – 2018-05-13 (×2): 20 mg via INTRAVENOUS
  Filled 2018-05-12 (×2): qty 2

## 2018-05-12 MED ORDER — ENOXAPARIN SODIUM 300 MG/3ML IJ SOLN
160.0000 mg | Freq: Two times a day (BID) | INTRAMUSCULAR | Status: DC
  Administered 2018-05-12 – 2018-05-13 (×2): 160 mg via SUBCUTANEOUS
  Filled 2018-05-12 (×4): qty 1.6

## 2018-05-12 NOTE — Progress Notes (Signed)
VASCULAR LAB PRELIMINARY  PRELIMINARY  PRELIMINARY  PRELIMINARY  Bilateral lower extremity venous duplex completed.    Preliminary report:  See CV proc for results  Kayon Dozier, RVT 05/12/2018, 3:34 PM

## 2018-05-12 NOTE — Progress Notes (Signed)
ANTICOAGULATION CONSULT NOTE   Pharmacy Consult for lovenox Indication: atrial fibrillation and f/u DVT  Not on File  Patient Measurements: Height: 5\' 4"  (162.6 cm) Weight: (!) 436 lb (197.8 kg) IBW/kg (Calculated) : 54.7  Vital Signs: Temp: 98.1 F (36.7 C) (03/07 0821) Temp Source: Oral (03/07 0821) BP: 107/81 (03/07 1250) Pulse Rate: 103 (03/07 1250)  Labs: Recent Labs    05/11/18 1325 05/11/18 1735 05/11/18 1943 05/11/18 2257 05/12/18 0443 05/12/18 1207  HGB 13.1 12.9  --   --  12.9  --   HCT 42.2 38.0  --   --  41.8  --   PLT 110*  --   --   --  105*  --   HEPARINUNFRC  --   --   --   --   --  1.30*  CREATININE 2.21*  --   --   --  1.99*  --   TROPONINI 0.06*  --  0.06* 0.07* 0.07*  --     Estimated Creatinine Clearance: 43.8 mL/min (A) (by C-G formula based on SCr of 1.99 mg/dL (H)).   Medical History: Past Medical History:  Diagnosis Date  . Hypertension    Assessment: 57 yof presented to the ED with afib and possible DVT. To start lovenox. She is not on anticoagulation PTA.  Pt also with elevated Scr. Pt is also obese. Lovenox will require close monitoring.   Received enoxaparin 200 mg dose at 0818 - level drawn at 1207 came back supratherapeutic at 1.3. Hgb 12.9, plt 105. No s/sx of bleeding.   Goal of Therapy:  Anti-Xa level 0.6-1 units/ml 4hrs after LMWH dose given Monitor platelets by anticoagulation protocol: Yes   Plan:  Reduce enoxaparin dosing to 160 mg SQ Q12H Will collect anti-Xa level 4 hours after second dose of new enoxaparin regimen F/u renal fxn, S&S of bleeding CBC Q72H  Antonietta Jewel, PharmD, [place] Clinical Pharmacist  Pager: [phone] Phone: [phone] 05/12/2018,1:30 PM

## 2018-05-12 NOTE — Plan of Care (Signed)
  Problem: Clinical Measurements: Goal: Ability to maintain clinical measurements within normal limits will improve Outcome: Progressing Goal: Respiratory complications will improve 05/12/2018 1640 by Sherron Flemings, RN Outcome: Progressing 05/12/2018 1511 by Sherron Flemings, RN Outcome: Progressing Goal: Cardiovascular complication will be avoided 05/12/2018 1640 by Sherron Flemings, RN Outcome: Progressing 05/12/2018 1511 by Sherron Flemings, RN Outcome: Progressing

## 2018-05-12 NOTE — Progress Notes (Signed)
Chart flagged as chest pain unit protocol pt (to identify low risk ACS patients early to decide on stress testing/ischemic testing). Does not appear patient meets criteria - adm with multisystem organ dysfunction, chart indicates cardiology consultation may be needed depending upon findings of echo. This has not been done yet. We will not see in chest pain rounds today but please call if formal consultation is needed.  Dayna Dunn PA-C

## 2018-05-12 NOTE — Progress Notes (Signed)
2D Echocardiogram has been performed.  Jean Stewart 05/12/2018, 10:14 AM

## 2018-05-12 NOTE — Plan of Care (Signed)
  Problem: Clinical Measurements: Goal: Respiratory complications will improve Outcome: Progressing Goal: Cardiovascular complication will be avoided Outcome: Progressing   

## 2018-05-12 NOTE — Progress Notes (Signed)
PROGRESS NOTE    Jean Stewart  TFT:[id number] DOB: [birth year] DOA: 05/11/2018 PCP: Patient, No Pcp Per   Brief Narrative:  73 yo AAF with hx of htn, and obesity who is a VERY poor historian.  Presented to the ER with complaints of generalized weakness and significant LE swelling.  She subsequently reported its been ongoing for over a year.  She was seen by hospitalist service and admitted for further eval ED Course: Patient was found in the ER with A. fib with RVR, heart rate as high as 129, blood pressure is low normal 97/57.  She was saturating 88% on room air and was put on 4 L of oxygen.  Troponin 0 0.06.  Creatinine 2.21, baseline unknown.  A chest x-ray shows cardiomegaly.  Twelve-lead EKG shows A. fib with RVR.Patient was started on Cardizem infusion in the ER.    Assessment & Plan:   Principal Problem:   Atrial fibrillation with RVR (HCC) Active Problems:   Obesity, Class III, BMI 40-49.9 (morbid obesity) (HCC)   Essential hypertension   Renal insufficiency   New onset a-fib (HCC)   Acute respiratory failure with hypoxia and hypercapnia (HCC)   Elevated troponin   Pressure injury of skin   1.  Acute respiratory failure with hypoxia and hypercapnia: I agree, likely multifactorial with undiagnosed underlying sleep apnea, obesity hypoventialtion, possible congestive heart failure with new onset A. Fib. Patient has acute/chronic respiratory acidosis.  pt has been weaned off BiPAP, repeat ABG in the morning with min change.  Depending upon the progress, she will need assist device at home, will need referral for sleep apnea study on discharge. May need supplemental oxygen to go home.  2.  A. fib with RVR: unknown chroncity.  Will try and wean off dilt gtt given her reduced EF, C/W metoprolol with parameters.  Titrate diltiazem to keep heart rate less than 100.  Patient is mostly asymptomatic. Has borderline elevated troponins that have remained flat.  Currently without any chest  pain. echocardiogram shows systolic heart failure EF 40-45,  Blood pressures are soft, will try diuresis with parameters. Consider ards consult tomorrow if no improvement.  3.  Renal insufficiency: Unknown whether acute or chronic.  No previous records available.  cr 1.99 this AM,  A1C 6,  lipid profile shows excellent control.  4.  Morbid obesity: As stated above.  Will need sleep apnea evaluation.  5.  CHF exac with systolic dysfuntion 70-62% EF with bilateral leg swelling:  Pt reported ongoing problem for >1 year although recently worsening, Will investigate for presence of DVT.  Her creatinine is 2, is not a candidate for CTA for PE study.  leg ultrasound.remains pending C/w renally dosed lovenox for now, although lower suspicion of dvt  6.  Dermatitis: Suspect dermatitis.  Less likely underlying soft tissue infection.  Will start with nystatin powder.  Will consult wound care.  DVT prophylaxis: BJ:SEGBTDV tx dose  Code Status: full    Code Status Orders  (From admission, onward)         Start     Ordered   05/11/18 2214  Full code  Continuous     05/11/18 2213        Code Status History    This patient has a current code status but no historical code status.     Family Communication: none present  Disposition Plan:   Pt to remain inpt for iv diuresis, resp support for acute hypoxic resp failure Consults called: none today  Admission status: Inpatient   Consultants:   cards by ER,   Procedures:  Dg Chest Port 1 View  Result Date: 05/11/2018 CLINICAL DATA:  Shortness of breath. Hypertension. EXAM: PORTABLE CHEST 1 VIEW COMPARISON:  None. FINDINGS: Mildly degraded exam due to AP portable technique and patient body habitus. Apical lordotic positioning. Midline trachea. Cardiomegaly accentuated by AP portable technique. Mild left hemidiaphragm elevation. No right and no definite left pleural effusion. Numerous leads and wires project over the chest. No pneumothorax. No  congestive failure. Relatively low lung volumes. This accentuates the pulmonary interstitium. IMPRESSION: Cardiomegaly and low lung volumes, without acute disease. Electronically Signed   By: Abigail Miyamoto M.D.   On: 05/11/2018 13:47     Antimicrobials:   NONE    Subjective: PT REPORTS BREATHING SOMEWHAT IMPROVED ALTHOUGH HER WEAKNESS AND LE SWELLING REMAINS HER BIGGEST PROBLEM  Objective: Vitals:   05/12/18 1056 05/12/18 1232 05/12/18 1250 05/12/18 1300  BP: 100/89 (!) 71/36 107/81 110/77  Pulse:  89 (!) 103 (!) 103  Resp: (!) 22 20 19  (!) 24  Temp:    98.9 F (37.2 C)  TempSrc:    Axillary  SpO2: 95% 96% 96% 95%  Weight:      Height:        Intake/Output Summary (Last 24 hours) at 05/12/2018 1441 Last data filed at 05/12/2018 0500 Gross per 24 hour  Intake 870.61 ml  Output -  Net 870.61 ml   Filed Weights   05/11/18 1300  Weight: (!) 197.8 kg    Examination:  General exam: Appears calm and comfortable  Respiratory system: Clear to auscultation. Respiratory effort normal. Cardiovascular system: S1 & S2 heard, RRR. No JVD, murmurs, rubs, gallops or clicks. No pedal edema. Gastrointestinal system: Abdomen is nondistended, soft and nontender. No organomegaly or masses felt. Normal bowel sounds heard. Central nervous system: Alert and oriented. No focal neurological deficits. Extremities: Symmetric 5 x 5 power, 2+ PITTING EDEMA Skin: erythematous dermatitis rash, no obvious draining lesions Psychiatry: poor insight,. Mood & affect appropriate.     Data Reviewed: I have personally reviewed following labs and imaging studies  CBC: Recent Labs  Lab 05/11/18 1325 05/11/18 1735 05/12/18 0443  WBC 7.9  --  8.4  NEUTROABS 6.3  --   --   HGB 13.1 12.9 12.9  HCT 42.2 38.0 41.8  MCV 101.4*  --  101.2*  PLT 110*  --  818*   Basic Metabolic Panel: Recent Labs  Lab 05/11/18 1325 05/11/18 1735 05/12/18 0443  NA 142 141 141  K 4.3 4.4 4.7  CL 104  --  102  CO2 29   --  24  GLUCOSE 100*  --  89  BUN 36*  --  40*  CREATININE 2.21*  --  1.99*  CALCIUM 9.0  --  8.8*   GFR: Estimated Creatinine Clearance: 43.8 mL/min (A) (by C-G formula based on SCr of 1.99 mg/dL (H)). Liver Function Tests: Recent Labs  Lab 05/11/18 1325  AST 46*  ALT 30  ALKPHOS 66  BILITOT 1.2  PROT 6.6  ALBUMIN 3.4*   No results for input(s): LIPASE, AMYLASE in the last 168 hours. No results for input(s): AMMONIA in the last 168 hours. Coagulation Profile: No results for input(s): INR, PROTIME in the last 168 hours. Cardiac Enzymes: Recent Labs  Lab 05/11/18 1325 05/11/18 1943 05/11/18 2257 05/12/18 0443  TROPONINI 0.06* 0.06* 0.07* 0.07*   BNP (last 3 results) No results for input(s): PROBNP in the  last 8760 hours. HbA1C: Recent Labs    05/11/18 1943  HGBA1C 6.0*   CBG: No results for input(s): GLUCAP in the last 168 hours. Lipid Profile: Recent Labs    05/11/18 1943  CHOL 119  HDL 41  LDLCALC 66  TRIG 62  CHOLHDL 2.9   Thyroid Function Tests: Recent Labs    05/11/18 1352  TSH 1.166   Anemia Panel: No results for input(s): VITAMINB12, FOLATE, FERRITIN, TIBC, IRON, RETICCTPCT in the last 72 hours. Sepsis Labs: No results for input(s): PROCALCITON, LATICACIDVEN in the last 168 hours.  No results found for this or any previous visit (from the past 240 hour(s)).       Radiology Studies: Dg Chest Port 1 View  Result Date: 05/11/2018 CLINICAL DATA:  Shortness of breath. Hypertension. EXAM: PORTABLE CHEST 1 VIEW COMPARISON:  None. FINDINGS: Mildly degraded exam due to AP portable technique and patient body habitus. Apical lordotic positioning. Midline trachea. Cardiomegaly accentuated by AP portable technique. Mild left hemidiaphragm elevation. No right and no definite left pleural effusion. Numerous leads and wires project over the chest. No pneumothorax. No congestive failure. Relatively low lung volumes. This accentuates the pulmonary  interstitium. IMPRESSION: Cardiomegaly and low lung volumes, without acute disease. Electronically Signed   By: Abigail Miyamoto M.D.   On: 05/11/2018 13:47        Scheduled Meds: . aspirin EC  81 mg Oral Daily  . enoxaparin (LOVENOX) injection  160 mg Subcutaneous Q12H  . furosemide  20 mg Intravenous BID  . metoprolol tartrate  25 mg Oral BID  . nystatin cream   Topical BID   Continuous Infusions: . diltiazem (CARDIZEM) infusion Stopped (05/12/18 1231)  . sodium chloride       LOS: 1 day    Time spent: 35 min    Nicolette Bang, MD [facility]  If 7PM-7AM, please contact night-coverage  05/12/2018, 2:41 PM

## 2018-05-12 NOTE — Progress Notes (Signed)
CRITICAL VALUE ALERT  Critical Value:  CO2 65.8, pH 7.295, bicarb 31  Date & Time Notied:  3/7 @ 0530  Provider Notified: Bodenheimer  No new orders. Will continue to monitor.

## 2018-05-13 ENCOUNTER — Encounter (HOSPITAL_COMMUNITY): Payer: Self-pay | Admitting: Physician Assistant

## 2018-05-13 ENCOUNTER — Inpatient Hospital Stay (HOSPITAL_COMMUNITY): Payer: Medicare Other

## 2018-05-13 ENCOUNTER — Inpatient Hospital Stay: Payer: Self-pay

## 2018-05-13 DIAGNOSIS — N184 Chronic kidney disease, stage 4 (severe): Secondary | ICD-10-CM

## 2018-05-13 DIAGNOSIS — J9602 Acute respiratory failure with hypercapnia: Secondary | ICD-10-CM

## 2018-05-13 DIAGNOSIS — J9601 Acute respiratory failure with hypoxia: Secondary | ICD-10-CM

## 2018-05-13 DIAGNOSIS — R7989 Other specified abnormal findings of blood chemistry: Secondary | ICD-10-CM

## 2018-05-13 DIAGNOSIS — I10 Essential (primary) hypertension: Secondary | ICD-10-CM

## 2018-05-13 DIAGNOSIS — Z6841 Body Mass Index (BMI) 40.0 and over, adult: Secondary | ICD-10-CM

## 2018-05-13 DIAGNOSIS — N289 Disorder of kidney and ureter, unspecified: Secondary | ICD-10-CM

## 2018-05-13 DIAGNOSIS — I5041 Acute combined systolic (congestive) and diastolic (congestive) heart failure: Secondary | ICD-10-CM

## 2018-05-13 DIAGNOSIS — I4891 Unspecified atrial fibrillation: Secondary | ICD-10-CM

## 2018-05-13 DIAGNOSIS — I5021 Acute systolic (congestive) heart failure: Secondary | ICD-10-CM

## 2018-05-13 LAB — CBC WITH DIFFERENTIAL/PLATELET
Abs Immature Granulocytes: 0.03 10*3/uL (ref 0.00–0.07)
Basophils Absolute: 0 10*3/uL (ref 0.0–0.1)
Basophils Relative: 0 %
Eosinophils Absolute: 0.1 10*3/uL (ref 0.0–0.5)
Eosinophils Relative: 1 %
HCT: 39.4 % (ref 36.0–46.0)
Hemoglobin: 12.1 g/dL (ref 12.0–15.0)
IMMATURE GRANULOCYTES: 0 %
Lymphocytes Relative: 8 %
Lymphs Abs: 0.7 10*3/uL (ref 0.7–4.0)
MCH: 31.3 pg (ref 26.0–34.0)
MCHC: 30.7 g/dL (ref 30.0–36.0)
MCV: 101.8 fL — ABNORMAL HIGH (ref 80.0–100.0)
Monocytes Absolute: 1.2 10*3/uL — ABNORMAL HIGH (ref 0.1–1.0)
Monocytes Relative: 13 %
NEUTROS ABS: 7 10*3/uL (ref 1.7–7.7)
Neutrophils Relative %: 78 %
PLATELETS: 102 10*3/uL — AB (ref 150–400)
RBC: 3.87 MIL/uL (ref 3.87–5.11)
RDW: 15 % (ref 11.5–15.5)
WBC: 9 10*3/uL (ref 4.0–10.5)
nRBC: 0 % (ref 0.0–0.2)

## 2018-05-13 LAB — BASIC METABOLIC PANEL
Anion gap: 11 (ref 5–15)
BUN: 47 mg/dL — ABNORMAL HIGH (ref 8–23)
CO2: 28 mmol/L (ref 22–32)
Calcium: 8.4 mg/dL — ABNORMAL LOW (ref 8.9–10.3)
Chloride: 100 mmol/L (ref 98–111)
Creatinine, Ser: 2.35 mg/dL — ABNORMAL HIGH (ref 0.44–1.00)
GFR calc Af Amer: 23 mL/min — ABNORMAL LOW (ref >60)
GFR calc non Af Amer: 20 mL/min — ABNORMAL LOW (ref >60)
Glucose, Bld: 111 mg/dL — ABNORMAL HIGH (ref 70–99)
Potassium: 4.7 mmol/L (ref 3.5–5.1)
Sodium: 139 mmol/L (ref 135–145)

## 2018-05-13 LAB — HEPARIN ANTI-XA: Heparin LMW: 1.81 IU/mL

## 2018-05-13 MED ORDER — AMIODARONE HCL IN DEXTROSE 360-4.14 MG/200ML-% IV SOLN
60.0000 mg/h | INTRAVENOUS | Status: AC
  Administered 2018-05-13: 60 mg/h via INTRAVENOUS
  Filled 2018-05-13 (×2): qty 200

## 2018-05-13 MED ORDER — TECHNETIUM TO 99M ALBUMIN AGGREGATED
4.2200 | Freq: Once | INTRAVENOUS | Status: AC | PRN
  Administered 2018-05-13: 4.22 via INTRAVENOUS

## 2018-05-13 MED ORDER — HEPARIN (PORCINE) 25000 UT/250ML-% IV SOLN: 1600.0000 [IU]/h | INTRAVENOUS | Status: DC

## 2018-05-13 MED ORDER — AMIODARONE LOAD VIA INFUSION
150.0000 mg | Freq: Once | INTRAVENOUS | Status: AC
  Administered 2018-05-13: 150 mg via INTRAVENOUS
  Filled 2018-05-13: qty 83.34

## 2018-05-13 MED ORDER — FUROSEMIDE 10 MG/ML IJ SOLN: 20.0000 mg | Freq: Once | INTRAMUSCULAR | Status: DC

## 2018-05-13 MED ORDER — TECHNETIUM TC 99M DIETHYLENETRIAME-PENTAACETIC ACID
32.3000 | Freq: Once | INTRAVENOUS | Status: AC | PRN
  Administered 2018-05-13: 32.3 via RESPIRATORY_TRACT

## 2018-05-13 MED ORDER — FUROSEMIDE 10 MG/ML IJ SOLN
40.0000 mg | Freq: Two times a day (BID) | INTRAMUSCULAR | Status: DC
  Administered 2018-05-14 – 2018-05-16 (×5): 40 mg via INTRAVENOUS
  Filled 2018-05-13 (×5): qty 4

## 2018-05-13 MED ORDER — ENOXAPARIN SODIUM 120 MG/0.8ML ~~LOC~~ SOLN
110.0000 mg | Freq: Two times a day (BID) | SUBCUTANEOUS | Status: DC
  Administered 2018-05-14: 110 mg via SUBCUTANEOUS
  Filled 2018-05-13 (×2): qty 0.73

## 2018-05-13 MED ORDER — AMIODARONE HCL IN DEXTROSE 360-4.14 MG/200ML-% IV SOLN
30.0000 mg/h | INTRAVENOUS | Status: DC
  Administered 2018-05-13 – 2018-05-14 (×2): 30 mg/h via INTRAVENOUS
  Filled 2018-05-13 (×2): qty 200

## 2018-05-13 NOTE — Progress Notes (Addendum)
ANTICOAGULATION CONSULT NOTE   Pharmacy Consult for lovenox> heparin infusion Indication: atrial fibrillation and f/u DVT  Not on File  Patient Measurements: Height: 5\' 4"  (162.6 cm) Weight: (!) 490 lb (222.3 kg) IBW/kg (Calculated) : 54.7  Heparin dose weight: 114.5 kg   Vital Signs: Temp: 99.1 F (37.3 C) (03/08 1700) Temp Source: Oral (03/08 1700) BP: 110/73 (03/08 1636) Pulse Rate: 107 (03/08 1636)  Labs: Recent Labs    05/11/18 1325 05/11/18 1735 05/11/18 1943 05/11/18 2257 05/12/18 0443 05/12/18 1207 05/13/18 0508 05/13/18 1240  HGB 13.1 12.9  --   --  12.9  --  12.1  --   HCT 42.2 38.0  --   --  41.8  --  39.4  --   PLT 110*  --   --   --  105*  --  102*  --   HEPARINUNFRC  --   --   --   --   --  1.30*  --   --   HEPRLOWMOCWT  --   --   --   --   --   --   --  1.81  CREATININE 2.21*  --   --   --  1.99*  --  2.35*  --   TROPONINI 0.06*  --  0.06* 0.07* 0.07*  --   --   --     Estimated Creatinine Clearance: 40.4 mL/min (A) (by C-G formula based on SCr of 2.35 mg/dL (H)).   Medical History: Past Medical History:  Diagnosis Date  . Hypertension   . Morbid obesity ([place])   . Physical deconditioning   . Suspected sleep apnea    Assessment: 63 yof presented to the ED with afib and possible DVT. Originally started on enoxaparin- not on anticoagulation PTA.  Given elevated Scr in setting of obesity, plan to change to heparin infusion.   Received enoxaparin 160 mg dose at 0929. Hgb 12/1, plt 102. No s/sx of bleeding.   Unable to get IV access for heparin infusion. Enoxaparin level drawn 3 hours after last dose came back supratherapeutic at 1.81. Despite being drawn an hour early, will plan to reduce dose and start 3 hours later than previous 12 hour regimen.   Goal of Therapy:  Heparin level: 0.3-0.7  Monitor platelets by anticoagulation protocol: Yes   Plan:  Will give dose of enoxaparin 110 mg every 12 hours starting on 3/9 at 0000  Once IV access is  obtained, MD is okay with plan to transition to enoxaparin If still remain on enoxaparin tomorrow, obtain enoxaparin level 4 hours after 2nd dose  Monitor daily HL, CBC, and for s/sx of bleeding  Antonietta Jewel, PharmD, [place] Clinical Pharmacist  Pager: [phone] Phone: [phone] 05/13/2018,6:52 PM

## 2018-05-13 NOTE — Consult Note (Addendum)
Cardiology Consultation:   Patient ID: [name]; [id number]; 06/01/1945   Admit date: 05/11/2018 Date of Consult: 05/13/2018  Primary Care Provider: Patient, No Pcp Per - was establishing care with a facility on [facility] but otherwise did not typically seek primary care services Primary Cardiologist: New Primary Electrophysiologist:  None  Chief Complaint: "I can't stand up"  Patient Profile:   Jean Stewart is a 73 y.o. female with a hx of severe morbid obesity (BMI 84), ? blood clot 5 years ago, deconditioning, suspected OSA (per daughter) and HTN who is being seen today for the evaluation of atrial fibrillation with RVR and abnormal echo/CHF at the request of Dr. Wyonia Hough. She is admitted with inability to stand up due to lack of strength, worsening leg swelling, DOE, AF RVR, hypoxia on RA, renal insufficiency with Cr of 2.21 with unknown baseline, and soft BP.  History of Present Illness:   Jean Stewart denies any prior cardiac history but it sounds like she does not typically see doctors on a regular basis. She used to live in [place] and moved to [facility] last June. She had established with a primary care doctor on [address] but denies having any recent bloodwork. She does not ever recall being told she had any heart issues or kidney problems. She recalls having a blood clot diagnosed in her legs and was treated with a shot for 3 days. She denies being told that she required longer term blood thinners for this. She has been living by herself although not really being able to do much due to deconditioning. She relied on help for other ADLs. She says the only activity she traditionally does is move from her bed to her living room chair via walker and sits there all day. Her daughter reports a poor diet. Although pt feels like her breathing is OK, daughter reports severe labored breathing with exertion which has been going on at least several months. The patient called her  daughter the other day informing her she simply could not stand up because she was so weak. When daughter arrived the patient was noted to have significantly more lower extremity edema than usual, and apparently at times looked sleepy and was talking gibberish. EMS was called and she was found to be in AF RVR with hypoxia at 88% on RA requiring supplemental O2. CXR shows cardiomegaly with low lung volumes without acute disease. Cr has been 2.21->1.99->2.35 with unknown baseline, normal Hgb, macrocytosis, albumin 3.4, BNP 309, troponins low/flat at 0.06-0.06-0.07-0.07. Her last ABG showed pH 7.295, PCO2 65.8, PO2 76.7. She was started on diltiazem drip but this has since been stopped; she is on Lopressor 25mg  BID but PM dose last night was held. She is on Lovenox full dose as well as baby ASA and started on Lasix 20mg  IV BID. HR remains in the 100-1teens, with softer BP at times - nadir 86/62 last night. 2D echo shows EF 40-45%, moderate LVH, elevated LVEDP, moderately reduced RV function, moderate LA dilation, moderate-severe mitral regurgitation and moderate-severe tricuspid regurgication, moderate pulmonary HTN and dilated IVC with mild hypokinesis of the inferior wall. LE venous duplex was limited but showed no evidence of DVT.  She denies any CP or awareness of AFib.  Past Medical History:  Diagnosis Date  . Hypertension   . Morbid obesity ([place])   . Physical deconditioning   . Suspected sleep apnea     History reviewed. No pertinent surgical history.   Inpatient Medications: Scheduled Meds: . aspirin  EC  81 mg Oral Daily  . enoxaparin (LOVENOX) injection  160 mg Subcutaneous Q12H  . furosemide  20 mg Intravenous BID  . metoprolol tartrate  25 mg Oral BID  . nystatin cream   Topical BID   Continuous Infusions: . diltiazem (CARDIZEM) infusion Stopped (05/12/18 1231)  . sodium chloride     PRN Meds: acetaminophen, albuterol, docusate sodium, HYDROcodone-acetaminophen  Home Meds: Prior  to Admission medications   Medication Sig Start Date End Date Taking? Authorizing Provider  acetaminophen (TYLENOL) 500 MG tablet Take 1,000 mg by mouth every 6 (six) hours as needed for mild pain.   Yes [provider]  amLODipine (NORVASC) 5 MG tablet Take 5 mg by mouth every morning. 03/21/18  Yes [provider]  docusate sodium (COLACE) 100 MG capsule Take 200 mg by mouth daily as needed for mild constipation.   Yes [provider]  hydrocortisone 2.5 % cream Apply 1 application topically 2 (two) times daily as needed for itching. 01/20/18  Yes [provider]    Allergies:   Not on File  Social History:   Social History   Socioeconomic History  . Marital status: Single    Spouse name: Not on file  . Number of children: Not on file  . Years of education: Not on file  . Highest education level: Not on file  Occupational History  . Not on file  Social Needs  . Financial resource strain: Not on file  . Food insecurity:    Worry: Not on file    Inability: Not on file  . Transportation needs:    Medical: Not on file    Non-medical: Not on file  Tobacco Use  . Smoking status: Former Research scientist (life sciences)  . Smokeless tobacco: Never Used  Substance and Sexual Activity  . Alcohol use: Not Currently  . Drug use: Never  . Sexual activity: Not Currently  Lifestyle  . Physical activity:    Days per week: Not on file    Minutes per session: Not on file  . Stress: Not on file  Relationships  . Social connections:    Talks on phone: Not on file    Gets together: Not on file    Attends religious service: Not on file    Active member of club or organization: Not on file    Attends meetings of clubs or organizations: Not on file    Relationship status: Not on file  . Intimate partner violence:    Fear of current or ex partner: Not on file    Emotionally abused: Not on file    Physically abused: Not on file    Forced sexual activity: Not on file  Other  Topics Concern  . Not on file  Social History Narrative  . Not on file    Family History:   The patient's family history includes CAD in her mother.  ROS:  Please see the history of present illness. All other ROS reviewed and negative.     Physical Exam/Data:   Vitals:   05/13/18 0425 05/13/18 0634 05/13/18 0842 05/13/18 0844  BP: 117/74  106/78 96/67  Pulse:   (!) 109 (!) 118  Resp: (!) 22  (!) 22 (!) 26  Temp:      TempSrc:      SpO2: 93%  98% 96%  Weight:  (!) 222.3 kg    Height:        Intake/Output Summary (Last 24 hours) at 05/13/2018 0849 Last  data filed at 05/13/2018 6659 Gross per 24 hour  Intake 165.66 ml  Output 1000 ml  Net -834.34 ml   Last 3 Weights 05/13/2018  Weight (lbs) 490 lb  Weight (kg) 222.263 kg    Body mass index is 84.11 kg/m.  General: Morbidly obese AAF in no acute distress on [place]. Laying about 30 degrees back in bed without acute dyspnea Head: Normocephalic, atraumatic, sclera non-icteric, no xanthomas, nares are without discharge.  Neck: Negative for carotid bruits. JVD difficult to assess given body habitus Lungs: Diffusely diminished, likely limited by body habitus, without wheezes, rales, or rhonchi. Breathing is unlabored. Heart: Irregularly irregular, distant, with S1 S2. No murmurs, rubs, or gallops appreciated. Abdomen: Soft, non-tender,rounded/obese with normoactive bowel sounds. No hepatomegaly. No rebound/guarding. No obvious abdominal masses. Msk:  Strength and tone appear normal for age. Extremities: No clubbing or cyanosis. 2+ BLE edema superimposed on large baseline leg habitus.  Neuro: Alert and oriented X 3. No facial asymmetry. No focal deficit. Moves all extremities spontaneously. Psych:  Responds to questions appropriately with a normal affect.  EKG:  The EKG was personally reviewed and demonstrates atrial fib 149bpm, low votlage throughout, nonspecific STT changes  Relevant CV Studies: 2D echo 05/12/18 IMPRESSIONS  1. The  left ventricle has mild-moderately reduced systolic function, with an ejection fraction of 40-45%. The cavity size was normal. There is moderate concentric left ventricular hypertrophy. Left ventricular diastology could not be evaluated Elevated  left ventricular end-diastolic pressure.  2. The right ventricle has moderately reduced systolic function. The cavity was normal. There is no increase in right ventricular wall thickness.  3. Left atrial size was moderately dilated.  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 The MR jet is posteriorly-directed.  5. The tricuspid valve is normal in structure. Tricuspid valve regurgitation is moderate-severe.  6. The aortic valve is tricuspid Mild thickening of the aortic valve Mild calcification of the aortic valve.  7. There is evidence of plaque in the ascending aorta.  8. Pulmonary hypertension is moderate.  9. The inferior vena cava was dilated in size with <50% respiratory variability. 10. Mild hypokinesis of the left ventricular inferior wall.  Laboratory Data:  Chemistry Recent Labs  Lab 05/11/18 1325 05/11/18 1735 05/12/18 0443 05/13/18 0508  NA 142 141 141 139  K 4.3 4.4 4.7 4.7  CL 104  --  102 100  CO2 29  --  24 28  GLUCOSE 100*  --  89 111*  BUN 36*  --  40* 47*  CREATININE 2.21*  --  1.99* 2.35*  CALCIUM 9.0  --  8.8* 8.4*  GFRNONAA 21*  --  24* 20*  GFRAA 25*  --  28* 23*  ANIONGAP 9  --  15 11    Recent Labs  Lab 05/11/18 1325  PROT 6.6  ALBUMIN 3.4*  AST 46*  ALT 30  ALKPHOS 66  BILITOT 1.2   Hematology Recent Labs  Lab 05/11/18 1325 05/11/18 1735 05/12/18 0443 05/13/18 0508  WBC 7.9  --  8.4 9.0  RBC 4.16  --  4.13 3.87  HGB 13.1 12.9 12.9 12.1  HCT 42.2 38.0 41.8 39.4  MCV 101.4*  --  101.2* 101.8*  MCH 31.5  --  31.2 31.3  MCHC 31.0  --  30.9 30.7  RDW 15.1  --  15.1 15.0  PLT 110*  --  105* 102*   Cardiac Enzymes Recent Labs  Lab  05/11/18 1325 05/11/18 1943 05/11/18 2257 05/12/18 0443  TROPONINI 0.06* 0.06* 0.07* 0.07*   No results for input(s): TROPIPOC in the last 168 hours.  BNP Recent Labs  Lab 05/11/18 1325  BNP 309.8*    DDimer No results for input(s): DDIMER in the last 168 hours.  Radiology/Studies:  Dg Chest Port 1 View  Result Date: 05/11/2018 CLINICAL DATA:  Shortness of breath. Hypertension. EXAM: PORTABLE CHEST 1 VIEW COMPARISON:  None. FINDINGS: Mildly degraded exam due to AP portable technique and patient body habitus. Apical lordotic positioning. Midline trachea. Cardiomegaly accentuated by AP portable technique. Mild left hemidiaphragm elevation. No right and no definite left pleural effusion. Numerous leads and wires project over the chest. No pneumothorax. No congestive failure. Relatively low lung volumes. This accentuates the pulmonary interstitium. IMPRESSION: Cardiomegaly and low lung volumes, without acute disease. Electronically Signed   By: Abigail Miyamoto M.D.   On: 05/11/2018 13:47   Vas [place] Lower Extremity Venous (dvt)  Result Date: 05/12/2018  Lower Venous Study Indications: Edema.  Limitations: Body habitus. Comparison Study: No prior study on file Performing Technologist: Sharion Dove RVS  Examination Guidelines: A complete evaluation includes B-mode imaging, spectral Doppler, color Doppler, and power Doppler as needed of all accessible portions of each vessel. Bilateral testing is considered an integral part of a complete examination. Limited examinations for reoccurring indications may be performed as noted.  Right Venous Findings: +---------+---------------+---------+-----------+----------+--------------+          CompressibilityPhasicitySpontaneityPropertiesSummary        +---------+---------------+---------+-----------+----------+--------------+ CFV                                                   visualized      +---------+---------------+---------+-----------+----------+--------------+ FV Prox                                               Not visualized +---------+---------------+---------+-----------+----------+--------------+ FV Mid                                                visualized     +---------+---------------+---------+-----------+----------+--------------+ FV Distal                                             Not visualized +---------+---------------+---------+-----------+----------+--------------+ PFV                                                   Not visualized +---------+---------------+---------+-----------+----------+--------------+ POP                                                   Not visualized +---------+---------------+---------+-----------+----------+--------------+ PTV  Not visualized +---------+---------------+---------+-----------+----------+--------------+  Left Venous Findings: +---------+---------------+---------+-----------+----------+--------------+          CompressibilityPhasicitySpontaneityPropertiesSummary        +---------+---------------+---------+-----------+----------+--------------+ CFV                                                   Not visualized +---------+---------------+---------+-----------+----------+--------------+ FV Prox                                               Not visualized +---------+---------------+---------+-----------+----------+--------------+ FV Mid                                                Not visualized +---------+---------------+---------+-----------+----------+--------------+ FV Distal                                             Not visualized +---------+---------------+---------+-----------+----------+--------------+ PFV                                                   Not visualized  +---------+---------------+---------+-----------+----------+--------------+ POP      Full           Yes      Yes                                 +---------+---------------+---------+-----------+----------+--------------+ PTV                                                   Not visualized +---------+---------------+---------+-----------+----------+--------------+ PERO                                                  Not visualized +---------+---------------+---------+-----------+----------+--------------+    Summary: Right: There is no evidence of deep vein thrombosis in the lower extremity. However, portions of this examination were limited- see technologist comments above. Left: There is no evidence of deep vein thrombosis in the lower extremity. However, portions of this examination were limited- see technologist comments above.  *See table(s) above for measurements and observations. Electronically signed by Monica Martinez MD on 05/12/2018 at 4:29:14 PM.    Final     Assessment and Plan:   1. Acute hypoxic and hypercarbic respiratory failure likely multifactorial from severe morbid obesity/OHS, CHF, and untreated OSA - mentating normally on O2, will likely require supplemental oxygen at discharge. With tachycardia, hypotension and hypoxia, cannot exclude the possibility of PE. She is extremely sedentary and apparently had some sort of clot about 5 years ago. However, renal function is currently prohibitive of assessment by CTA.  Might consider VQ scan. Will likely require anticoagulation longterm regardless for atrial fib; question whether we need to consider Coumadin as DOACs may be less ideal in patients with such severe obesity.  2. Acute combined CHF with EF 40-45%, moderate RV dysfunction, moderate to severe mitral regurgitation and moderate to severe tricuspid regurgitation and moderate pulmonary HTN - remains volume overloaded. BP limits aggressive diuresis. Plan to review with  MD.  3. Newly recognized atrial fibrillation with RVR - CHADSVASC is 4 for CHF, HTN, age, and female which would warrant longterm anticoagulation. Left atrium is moderately dilated. With her severe obesity and likely untreated OSA I am not convinced she will be able to maintain NSR long-term but will discuss plans for rate/rhythm with MD. Digoxin is not a good option for control given her renal insufficiency.  4. Renal insufficiency with unknown baseline - does not regularly seek care, denies recent OP bloodwork. Obtain renal US. Avoid nephrotoxic agents. ?Cardiorenal. If this progresses during stay would benefit from nephrology consultation to establish care.  5. Elevated troponin - low and flat, consistent with demand ischemia. No chest pain. Not currently a candidate for invasive ischemic assessment given renal insufficiency but can consider in future given LV dysfunction.   6. Essential HTN with hypotension - continue to hold home amlodipine.  7. Morbid obesity - at this point life-threatening. Discussed with pt. Needs serious lifestyle changes. Recommend [facility] at discharge vs bariatric referral. Consult dietitian for diet education for obesity and CHF. Will also ask nurse for re-weigh to ensure accurate.  8. Leg weakness - will need PT when HR improves; may need SNF at DC.  For questions or updates, please contact [place] Please consult [email or web address] for contact info under Cardiology/STEMI.  Signed, Charlie Pitter, PA-C  05/13/2018 8:49 AM  The patient was seen, examined and discussed with Melina Copa, PA-C and I agree with the above.   A pleasant 73 year old female who is a new patient to [place] and presented with progressively worsening shortness of breath, worsening lower extremity edema and inability to perform activities of daily living.  The patient has morbid obesity with BMI of 84, lives by herself with very limited help of her family  members, and is minimally active just doing absolutely necessary during the day moving between her bedroom and bathroom.  She has noticed worsening lower extremity edema, orthopnea and paroxysmal nocturnal dyspnea that was followed by shortness of breath at rest.  On physical exam she has elevated JVDs bilaterally, crackles at both lungs, regular heartbeats with 3 out of 6 systolic murmur, and anasarca.  She does not seem to be in acute distress laying relatively flat in bed.  Her chest x-ray shows central pulmonary edema.  Her labs show creatinine of 2.35, potassium 4.7, hemoglobin 12.1, platelets 102, troponin of 0.072, BNP 309.  She was found to be in new atrial fibrillation with RVR.  She was started on Cardizem drip but because of hypotension this was discontinued.   Assessment and plan:  Acute combined systolic diastolic CHF Acute respiratory failure with hypoxia and hypercapnia Obesity hypoventilation syndrome, highly likely obstructive sleep apnea Atrial fibrillation with RVR Morbid obesity Demand ischemia in the settings of CHF renal failure Renal failure, CKD stage IV , unknown chronicity, no baseline known  I will increase Lasix to 40 mg IV twice daily, start amiodarone drip, start heparin drip, obtain VQ scan to evaluate for possible pulmonary embolism, obtain renal ultrasound, continue metoprolol  25 p.o. twice daily, her social situation seems to be worrisome with inability to go to her regular doctors appointment or obtain her meds, we will place a consult to physical therapy and case management for possible placement into skilled nursing facility. Her prognosis overall is very poor, she could potentially benefit from placement of inotropes to help with cardiorenal syndrome however given her comorbidities and morbid obesity this will probably not improve her mortality overall.  Jean Dawley, MD 05/13/2018

## 2018-05-13 NOTE — Progress Notes (Signed)
ANTICOAGULATION CONSULT NOTE   Pharmacy Consult for lovenox> heparin infusion Indication: atrial fibrillation and f/u DVT  Not on File  Patient Measurements: Height: 5\' 4"  (162.6 cm) Weight: (!) 490 lb (222.3 kg) IBW/kg (Calculated) : 54.7  Heparin dose weight: 114.5 kg   Vital Signs: Temp: 98.6 F (37 C) (03/08 0020) Temp Source: Oral (03/08 0020) BP: 106/78 (03/08 0932) Pulse Rate: 118 (03/08 0844)  Labs: Recent Labs    05/11/18 1325 05/11/18 1735 05/11/18 1943 05/11/18 2257 05/12/18 0443 05/12/18 1207 05/13/18 0508  HGB 13.1 12.9  --   --  12.9  --  12.1  HCT 42.2 38.0  --   --  41.8  --  39.4  PLT 110*  --   --   --  105*  --  102*  HEPARINUNFRC  --   --   --   --   --  1.30*  --   CREATININE 2.21*  --   --   --  1.99*  --  2.35*  TROPONINI 0.06*  --  0.06* 0.07* 0.07*  --   --     Estimated Creatinine Clearance: 40.4 mL/min (A) (by C-G formula based on SCr of 2.35 mg/dL (H)).   Medical History: Past Medical History:  Diagnosis Date  . Hypertension   . Morbid obesity ([place])   . Physical deconditioning   . Suspected sleep apnea    Assessment: 47 yof presented to the ED with afib and possible DVT. Originally started on enoxaparin- not on anticoagulation PTA.  Given elevated Scr in setting of obesity, plan to change to heparin infusion.   Received enoxaparin 160 mg dose at 0929. Hgb 12/1, plt 102. No s/sx of bleeding. Will start heparin infusion 8 hours after last dose of enoxaparin without a bolus- normally would consider 4-6 hours however had previous supratherapeutic level on 200 mg every 12 hours and Scr has increased from 1.99 to 2.35 today with only 1 L uop in last 24 hours.   Goal of Therapy:  Heparin level: 0.3-0.7  Monitor platelets by anticoagulation protocol: Yes   Plan:  Discontinue enoxaparin dosing Start heparin infusion at 1600 units/hr on 3/8@1730  Will obtain heparin level 6 hours after infusion started  Monitor daily HL, CBC, and for s/sx  of bleeding  Antonietta Jewel, PharmD, [place] Clinical Pharmacist  Pager: [phone] Phone: [phone] 05/13/2018,10:16 AM

## 2018-05-13 NOTE — Progress Notes (Signed)
Multiple unsuccessful attempts to obtain 2nd PIV. Maudie Mercury, PharmD notified that pt has only one PIV with Amio gtt infusing and 2nd IV is needed to start Heparin gtt. Per PICC line RN, it may be tomorrow before PICC can be started. PharmD to notify MD.

## 2018-05-13 NOTE — Progress Notes (Signed)
Attempted to obtain consent from pt for PICC procedure 05-14-18.  Pt sleeping upon entering room, awakened easily, but repeatedly falling asleep during conversation.  Greg RN notified, states pt is baseline A&O x4.  No consent obtained at this time.

## 2018-05-13 NOTE — Plan of Care (Signed)
  Problem: Clinical Measurements: Goal: Respiratory complications will improve Outcome: Progressing Goal: Cardiovascular complication will be avoided Outcome: Progressing   

## 2018-05-13 NOTE — Progress Notes (Signed)
PROGRESS NOTE    [name]  FUX:[id number] DOB: 1945/12/02 DOA: 05/11/2018 PCP: Patient, No Pcp Per   Brief Narrative:  73 yo AAF with hx of htn, and obesity who is a VERY poor historian.  Presented to the ER with complaints of generalized weakness and significant LE swelling.  She subsequently reported its been ongoing for over a year.  She was seen by hospitalist service and admitted for further eval ED Course:Patient was found in the ER with A. fib with RVR, heart rate as high as 129, blood pressure is low normal 97/57. She was saturating 88% on room air and was put on 4 L of oxygen. Troponin 0 0.06. Creatinine 2.21, baseline unknown.A chest x-ray shows cardiomegaly. Twelve-lead EKG shows A. fib with RVR.Patient was started on Cardizem infusion in the ER.   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 Fib. Patient has acute/chronic respiratory acidosis. pt has been weaned off BiPAP, repeat ABG in the morning with min change. Depending upon the progress, she will need assist device at home, will need referral for sleep apnea study on discharge. May need supplemental oxygen to go home.  2.A. fib with RVR: unknown chroncity. weaned off dilt gtt given her reduced EF, C/W metoprolol with parameters. Patient is mostly asymptomatic. Has borderline elevated troponins that have remained flat. Currently without any chest pain. echocardiogram shows systolic heart failure EF 40-45, Blood  pressures are soft, will try diuresis with parameters, discussed with cardiology who saw in consultation.  They added amiodarone, heparin drip pursuing a VQ scan.  Increase diuresis to 40 IV twice daily.  3.Renal insufficiency: Unknown whether acute or chronic. No previous records available. cr increased to 2.4 this morning,  A1C 6,  lipid profile shows excellent control.  Concern for cardiorenal syndrome  4.Morbid obesity: As stated above. Will need sleep apnea evaluation.  5.CHF exac with systolic dysfuntion 54-27% EF with bilateral leg swelling:  Pt reported ongoing problem for >1 year although recently worsening,  negative for presence of DVT. VQ scan today, placed on heparin drip per cardiology   6. Dermatitis: Suspect dermatitis. Less likely underlying soft tissue infection. Will start with nystatin powder. Will consult wound care.  DVT prophylaxis: On:hep gtt  Code Status: full    Code Status Orders  (From admission, onward)         Start     Ordered   05/11/18 2214  Full code  Continuous     05/11/18 2213        Code Status History    This patient has a current code status but no historical code status.     Family Communication: None present Disposition Plan:   Patient will remain inpatient for IV heparin, IV amiodarone, advance cardiac monitoring, IV diuresis, for treatment of her A. fib RVR and acute systolic congestive heart failure Consults called: Cardiology Admission status: Inpatient   Consultants:   cards  Procedures:  Dg Chest Port 1 View  Result Date: 05/11/2018 CLINICAL DATA:  Shortness of breath. Hypertension.  EXAM: PORTABLE CHEST 1 VIEW COMPARISON:  None. FINDINGS: Mildly degraded exam due to AP portable technique and patient body habitus. Apical lordotic positioning. Midline trachea. Cardiomegaly accentuated by AP portable technique. Mild left hemidiaphragm elevation. No right and no definite left pleural effusion. Numerous leads and  wires project over the chest. No pneumothorax. No congestive failure. Relatively low lung volumes. This accentuates the pulmonary interstitium. IMPRESSION: Cardiomegaly and low lung volumes, without acute disease. Electronically Signed   By: Abigail Miyamoto M.D.   On: 05/11/2018 13:47   Vas [place] Lower Extremity Venous (dvt)  Result Date: 05/12/2018  Lower Venous Study Indications: Edema.  Limitations: Body habitus. Comparison Study: No prior study on file Performing Technologist: Sharion Dove RVS  Examination Guidelines: A complete evaluation includes B-mode imaging, spectral Doppler, color Doppler, and power Doppler as needed of all accessible portions of each vessel. Bilateral testing is considered an integral part of a complete examination. Limited examinations for reoccurring indications may be performed as noted.  Right Venous Findings: +---------+---------------+---------+-----------+----------+--------------+          CompressibilityPhasicitySpontaneityPropertiesSummary        +---------+---------------+---------+-----------+----------+--------------+ CFV                                                   visualized     +---------+---------------+---------+-----------+----------+--------------+ FV Prox                                               Not visualized +---------+---------------+---------+-----------+----------+--------------+ FV Mid                                                visualized     +---------+---------------+---------+-----------+----------+--------------+ FV Distal                                             Not visualized +---------+---------------+---------+-----------+----------+--------------+ PFV                                                   Not visualized +---------+---------------+---------+-----------+----------+--------------+ POP                                                   Not visualized  +---------+---------------+---------+-----------+----------+--------------+ PTV                                                   Not visualized +---------+---------------+---------+-----------+----------+--------------+  Left Venous Findings: +---------+---------------+---------+-----------+----------+--------------+          CompressibilityPhasicitySpontaneityPropertiesSummary        +---------+---------------+---------+-----------+----------+--------------+ CFV  Not visualized +---------+---------------+---------+-----------+----------+--------------+ FV Prox                                               Not visualized +---------+---------------+---------+-----------+----------+--------------+ FV Mid                                                Not visualized +---------+---------------+---------+-----------+----------+--------------+ FV Distal                                             Not visualized +---------+---------------+---------+-----------+----------+--------------+ PFV                                                   Not visualized +---------+---------------+---------+-----------+----------+--------------+ POP      Full           Yes      Yes                                 +---------+---------------+---------+-----------+----------+--------------+ PTV                                                   Not visualized +---------+---------------+---------+-----------+----------+--------------+ PERO                                                  Not visualized +---------+---------------+---------+-----------+----------+--------------+    Summary: Right: There is no evidence of deep vein thrombosis in the lower extremity. However, portions of this examination were limited- see technologist comments above. Left: There is no evidence of deep vein thrombosis in the lower extremity.  However, portions of this examination were limited- see technologist comments above.  *See table(s) above for measurements and observations. Electronically signed by Monica Martinez MD on 05/12/2018 at 4:29:14 PM.    Final      Antimicrobials:   None   Subjective: Patient without acute complaint today.  She continues to look chronically ill.  Mildly short of breath, edematous lower extremities, rales bilaterally,  Objective: Vitals:   05/13/18 0634 05/13/18 0842 05/13/18 0844 05/13/18 0932  BP:  106/78 96/67 106/78  Pulse:  (!) 109 (!) 118   Resp:  (!) 22 (!) 26   Temp:      TempSrc:      SpO2:  98% 96%   Weight: (!) 222.3 kg     Height: 5\' 4"  (1.626 m)       Intake/Output Summary (Last 24 hours) at 05/13/2018 1202 Last data filed at 05/13/2018 0900 Gross per 24 hour  Intake 405.66 ml  Output 600 ml  Net -194.34 ml   Filed Weights   05/13/18 0634  Weight: (!) 222.3 kg  Examination:  General exam: Morbidly obese, chronically ill-appearing, Respiratory system: Limited exam secondary body habitus although rales noted bilaterally. Cardiovascular system: Systolic ejection murmur, irregularly irregular gastrointestinal system: Abdomen is nondistended, soft and nontender. No organomegaly or masses felt. Normal bowel sounds heard. Central nervous system: Alert and oriented. No focal neurological deficits. Extremities: Symmetric 5 x 5 power.  2+ pitting edema lower extremities Skin: No rashes, lesions or ulcers Psychiatry: Poor insight. Mood & affect appropriate.     Data Reviewed: I have personally reviewed following labs and imaging studies  CBC: Recent Labs  Lab 05/11/18 1325 05/11/18 1735 05/12/18 0443 05/13/18 0508  WBC 7.9  --  8.4 9.0  NEUTROABS 6.3  --   --  7.0  HGB 13.1 12.9 12.9 12.1  HCT 42.2 38.0 41.8 39.4  MCV 101.4*  --  101.2* 101.8*  PLT 110*  --  105* 481*   Basic Metabolic Panel: Recent Labs  Lab 05/11/18 1325 05/11/18 1735 05/12/18 0443  05/13/18 0508  NA 142 141 141 139  K 4.3 4.4 4.7 4.7  CL 104  --  102 100  CO2 29  --  24 28  GLUCOSE 100*  --  89 111*  BUN 36*  --  40* 47*  CREATININE 2.21*  --  1.99* 2.35*  CALCIUM 9.0  --  8.8* 8.4*   GFR: Estimated Creatinine Clearance: 40.4 mL/min (A) (by C-G formula based on SCr of 2.35 mg/dL (H)). Liver Function Tests: Recent Labs  Lab 05/11/18 1325  AST 46*  ALT 30  ALKPHOS 66  BILITOT 1.2  PROT 6.6  ALBUMIN 3.4*   No results for input(s): LIPASE, AMYLASE in the last 168 hours. No results for input(s): AMMONIA in the last 168 hours. Coagulation Profile: No results for input(s): INR, PROTIME in the last 168 hours. Cardiac Enzymes: Recent Labs  Lab 05/11/18 1325 05/11/18 1943 05/11/18 2257 05/12/18 0443  TROPONINI 0.06* 0.06* 0.07* 0.07*   BNP (last 3 results) No results for input(s): PROBNP in the last 8760 hours. HbA1C: Recent Labs    05/11/18 1943  HGBA1C 6.0*   CBG: No results for input(s): GLUCAP in the last 168 hours. Lipid Profile: Recent Labs    05/11/18 1943  CHOL 119  HDL 41  LDLCALC 66  TRIG 62  CHOLHDL 2.9   Thyroid Function Tests: Recent Labs    05/11/18 1352  TSH 1.166   Anemia Panel: No results for input(s): VITAMINB12, FOLATE, FERRITIN, TIBC, IRON, RETICCTPCT in the last 72 hours. Sepsis Labs: No results for input(s): PROCALCITON, LATICACIDVEN in the last 168 hours.  No results found for this or any previous visit (from the past 240 hour(s)).       Radiology Studies: Dg Chest Port 1 View  Result Date: 05/11/2018 CLINICAL DATA:  Shortness of breath. Hypertension. EXAM: PORTABLE CHEST 1 VIEW COMPARISON:  None. FINDINGS: Mildly degraded exam due to AP portable technique and patient body habitus. Apical lordotic positioning. Midline trachea. Cardiomegaly accentuated by AP portable technique. Mild left hemidiaphragm elevation. No right and no definite left pleural effusion. Numerous leads and wires project over the chest.  No pneumothorax. No congestive failure. Relatively low lung volumes. This accentuates the pulmonary interstitium. IMPRESSION: Cardiomegaly and low lung volumes, without acute disease. Electronically Signed   By: Abigail Miyamoto M.D.   On: 05/11/2018 13:47   Vas [place] Lower Extremity Venous (dvt)  Result Date: 05/12/2018  Lower Venous Study Indications: Edema.  Limitations: Body habitus. Comparison Study: No prior study  on file Performing Technologist: Sharion Dove RVS  Examination Guidelines: A complete evaluation includes B-mode imaging, spectral Doppler, color Doppler, and power Doppler as needed of all accessible portions of each vessel. Bilateral testing is considered an integral part of a complete examination. Limited examinations for reoccurring indications may be performed as noted.  Right Venous Findings: +---------+---------------+---------+-----------+----------+--------------+          CompressibilityPhasicitySpontaneityPropertiesSummary        +---------+---------------+---------+-----------+----------+--------------+ CFV                                                   visualized     +---------+---------------+---------+-----------+----------+--------------+ FV Prox                                               Not visualized +---------+---------------+---------+-----------+----------+--------------+ FV Mid                                                visualized     +---------+---------------+---------+-----------+----------+--------------+ FV Distal                                             Not visualized +---------+---------------+---------+-----------+----------+--------------+ PFV                                                   Not visualized +---------+---------------+---------+-----------+----------+--------------+ POP                                                   Not visualized  +---------+---------------+---------+-----------+----------+--------------+ PTV                                                   Not visualized +---------+---------------+---------+-----------+----------+--------------+  Left Venous Findings: +---------+---------------+---------+-----------+----------+--------------+          CompressibilityPhasicitySpontaneityPropertiesSummary        +---------+---------------+---------+-----------+----------+--------------+ CFV                                                   Not visualized +---------+---------------+---------+-----------+----------+--------------+ FV Prox                                               Not visualized +---------+---------------+---------+-----------+----------+--------------+ FV Mid  Not visualized +---------+---------------+---------+-----------+----------+--------------+ FV Distal                                             Not visualized +---------+---------------+---------+-----------+----------+--------------+ PFV                                                   Not visualized +---------+---------------+---------+-----------+----------+--------------+ POP      Full           Yes      Yes                                 +---------+---------------+---------+-----------+----------+--------------+ PTV                                                   Not visualized +---------+---------------+---------+-----------+----------+--------------+ PERO                                                  Not visualized +---------+---------------+---------+-----------+----------+--------------+    Summary: Right: There is no evidence of deep vein thrombosis in the lower extremity. However, portions of this examination were limited- see technologist comments above. Left: There is no evidence of deep vein thrombosis in the lower extremity.  However, portions of this examination were limited- see technologist comments above.  *See table(s) above for measurements and observations. Electronically signed by Monica Martinez MD on 05/12/2018 at 4:29:14 PM.    Final         Scheduled Meds: . amiodarone  150 mg Intravenous Once  . aspirin EC  81 mg Oral Daily  . furosemide  40 mg Intravenous BID  . metoprolol tartrate  25 mg Oral BID  . nystatin cream   Topical BID   Continuous Infusions: . amiodarone     Followed by  . amiodarone    . heparin    . sodium chloride       LOS: 2 days    Time spent: 35 min    Nicolette Bang, MD [facility]  If 7PM-7AM, please contact night-coverage  05/13/2018, 12:02 PM

## 2018-05-13 NOTE — Progress Notes (Addendum)
Spoke with Marya Amsler RN re PICC order.  PICC to be placed 05-14-18.  Marya Amsler states pt has PIV working well at this time and will notify pharmacy of need to not start IV heparin tonight.  States no known hx  Of kidney disease from pt or chart, questioned due to CrCl of 23.

## 2018-05-14 DIAGNOSIS — I5023 Acute on chronic systolic (congestive) heart failure: Secondary | ICD-10-CM

## 2018-05-14 LAB — CBC WITH DIFFERENTIAL/PLATELET
Abs Immature Granulocytes: 0.03 10*3/uL (ref 0.00–0.07)
BASOS PCT: 0 %
Basophils Absolute: 0 10*3/uL (ref 0.0–0.1)
Eosinophils Absolute: 0.1 10*3/uL (ref 0.0–0.5)
Eosinophils Relative: 1 %
HCT: 38.4 % (ref 36.0–46.0)
Hemoglobin: 11.5 g/dL — ABNORMAL LOW (ref 12.0–15.0)
Immature Granulocytes: 0 %
Lymphocytes Relative: 9 %
Lymphs Abs: 0.6 10*3/uL — ABNORMAL LOW (ref 0.7–4.0)
MCH: 30.3 pg (ref 26.0–34.0)
MCHC: 29.9 g/dL — ABNORMAL LOW (ref 30.0–36.0)
MCV: 101.1 fL — ABNORMAL HIGH (ref 80.0–100.0)
Monocytes Absolute: 1 10*3/uL (ref 0.1–1.0)
Monocytes Relative: 14 %
Neutro Abs: 5.4 10*3/uL (ref 1.7–7.7)
Neutrophils Relative %: 76 %
PLATELETS: 108 10*3/uL — AB (ref 150–400)
RBC: 3.8 MIL/uL — AB (ref 3.87–5.11)
RDW: 14.7 % (ref 11.5–15.5)
WBC: 7.1 10*3/uL (ref 4.0–10.5)
nRBC: 0 % (ref 0.0–0.2)

## 2018-05-14 LAB — BASIC METABOLIC PANEL
Anion gap: 7 (ref 5–15)
BUN: 51 mg/dL — ABNORMAL HIGH (ref 8–23)
CO2: 28 mmol/L (ref 22–32)
Calcium: 8.3 mg/dL — ABNORMAL LOW (ref 8.9–10.3)
Chloride: 102 mmol/L (ref 98–111)
Creatinine, Ser: 2.49 mg/dL — ABNORMAL HIGH (ref 0.44–1.00)
GFR calc Af Amer: 21 mL/min — ABNORMAL LOW (ref >60)
GFR, EST NON AFRICAN AMERICAN: 18 mL/min — AB (ref >60)
Glucose, Bld: 112 mg/dL — ABNORMAL HIGH (ref 70–99)
Potassium: 4.6 mmol/L (ref 3.5–5.1)
Sodium: 137 mmol/L (ref 135–145)

## 2018-05-14 LAB — HEPARIN ANTI-XA: Heparin LMW: 1.3 [IU]/mL

## 2018-05-14 MED ORDER — ZINC OXIDE 12.8 % EX OINT
TOPICAL_OINTMENT | CUTANEOUS | Status: DC | PRN
  Filled 2018-05-14 (×2): qty 56.7

## 2018-05-14 MED ORDER — SODIUM CHLORIDE 0.9% FLUSH
10.0000 mL | INTRAVENOUS | Status: DC | PRN
  Filled 2018-05-14: qty 40

## 2018-05-14 MED ORDER — SODIUM CHLORIDE 0.9% FLUSH
10.0000 mL | Freq: Two times a day (BID) | INTRAVENOUS | Status: DC
  Administered 2018-05-14 – 2018-05-15 (×3): 10 mL
  Administered 2018-05-16: 20 mL
  Administered 2018-05-16 – 2018-05-17 (×3): 10 mL
  Administered 2018-05-18: 20 mL
  Administered 2018-05-18: 10 mL

## 2018-05-14 MED ORDER — AMIODARONE HCL 200 MG PO TABS
200.0000 mg | ORAL_TABLET | Freq: Two times a day (BID) | ORAL | Status: DC
  Administered 2018-05-14 – 2018-05-15 (×3): 200 mg via ORAL
  Filled 2018-05-14 (×2): qty 1

## 2018-05-14 MED ORDER — PRO-STAT SUGAR FREE PO LIQD
30.0000 mL | Freq: Two times a day (BID) | ORAL | Status: DC
  Administered 2018-05-14 – 2018-05-18 (×9): 30 mL via ORAL
  Filled 2018-05-14 (×8): qty 30

## 2018-05-14 MED ORDER — METOPROLOL TARTRATE 25 MG PO TABS
37.5000 mg | ORAL_TABLET | Freq: Two times a day (BID) | ORAL | Status: DC
  Administered 2018-05-14 – 2018-05-15 (×2): 37.5 mg via ORAL
  Filled 2018-05-14 (×2): qty 1

## 2018-05-14 NOTE — Progress Notes (Signed)
Progress Note  Patient Name: Jean Stewart Date of Encounter: 05/14/2018  Primary Cardiologist: Ena Dawley, MD   Subjective   No complaints. Sleeping but wakes to respond to questions. Denies CP.   Inpatient Medications    Scheduled Meds: . aspirin EC  81 mg Oral Daily  . furosemide  40 mg Intravenous BID  . metoprolol tartrate  25 mg Oral BID  . nystatin cream   Topical BID  . sodium chloride flush  10-40 mL Intracatheter Q12H   Continuous Infusions: . amiodarone 30 mg/hr (05/14/18 1040)  . sodium chloride     PRN Meds: acetaminophen, albuterol, docusate sodium, HYDROcodone-acetaminophen, sodium chloride flush   Vital Signs    Vitals:   05/14/18 0900 05/14/18 1038 05/14/18 1100 05/14/18 1144  BP:  109/69    Pulse: (!) 116 (!) 119 (!) 115 (!) 108  Resp: 20 (!) 21 (!) 27 20  Temp:      TempSrc:      SpO2: 91% 93% 95% 98%  Weight:      Height:        Intake/Output Summary (Last 24 hours) at 05/14/2018 1150 Last data filed at 05/14/2018 1040 Gross per 24 hour  Intake 698.15 ml  Output -  Net 698.15 ml   Last 3 Weights 05/14/2018 05/13/2018  Weight (lbs) 450 lb 490 lb  Weight (kg) 204.119 kg 222.263 kg      Telemetry    Atrial fibrillation low 100s-110s- Personally Reviewed  ECG    afib w/ RVR- Personally Reviewed  Physical Exam   GEN: super morbidly obese AAF in No acute distress.   Neck: No JVD Cardiac: irregularly irregular rhythm w tachy rate, no murmurs, rubs, or gallops.  Respiratory: Clear to auscultation bilaterally. GI: obese abdomen, soft, nontender, non-distended  MS: obese extremities. Neuro:  Nonfocal  Psych: Normal affect   Labs    Chemistry Recent Labs  Lab 05/11/18 1325  05/12/18 0443 05/13/18 0508 05/14/18 0412  NA 142   < > 141 139 137  K 4.3   < > 4.7 4.7 4.6  CL 104  --  102 100 102  CO2 29  --  24 28 28   GLUCOSE 100*  --  89 111* 112*  BUN 36*  --  40* 47* 51*  CREATININE 2.21*  --  1.99* 2.35* 2.49*  CALCIUM  9.0  --  8.8* 8.4* 8.3*  PROT 6.6  --   --   --   --   ALBUMIN 3.4*  --   --   --   --   AST 46*  --   --   --   --   ALT 30  --   --   --   --   ALKPHOS 66  --   --   --   --   BILITOT 1.2  --   --   --   --   GFRNONAA 21*  --  24* 20* 18*  GFRAA 25*  --  28* 23* 21*  ANIONGAP 9  --  15 11 7    < > = values in this interval not displayed.     Hematology Recent Labs  Lab 05/12/18 0443 05/13/18 0508 05/14/18 0412  WBC 8.4 9.0 7.1  RBC 4.13 3.87 3.80*  HGB 12.9 12.1 11.5*  HCT 41.8 39.4 38.4  MCV 101.2* 101.8* 101.1*  MCH 31.2 31.3 30.3  MCHC 30.9 30.7 29.9*  RDW 15.1 15.0 14.7  PLT 105* 102*  108*    Cardiac Enzymes Recent Labs  Lab 05/11/18 1325 05/11/18 1943 05/11/18 2257 05/12/18 0443  TROPONINI 0.06* 0.06* 0.07* 0.07*   No results for input(s): TROPIPOC in the last 168 hours.   BNP Recent Labs  Lab 05/11/18 1325  BNP 309.8*     DDimer No results for input(s): DDIMER in the last 168 hours.   Radiology    Dg Chest 2 View  Result Date: 05/13/2018 CLINICAL DATA:  Shortness of breath. EXAM: CHEST - 2 VIEW COMPARISON:  05/11/2018 FINDINGS: Examination is degraded due to patient habitus. Grossly unchanged enlarged cardiac silhouette and mediastinal contours. Pulmonary vasculature appears slightly less distinct on the present examination with cephalization of flow. There is minimal pleuroparenchymal thickening about the bilateral major fissures. Minimal perihilar heterogeneous opacities favored to represent atelectasis. No new focal airspace opacities. Trace pleural effusions are not excluded. No pneumothorax. No acute osseous abnormalities. IMPRESSION: Cardiomegaly with suspected mild pulmonary edema and trace bilateral effusions on this body habitus degraded examination. Electronically Signed   By: Sandi Mariscal M.D.   On: 05/13/2018 15:23   US Renal  Result Date: 05/13/2018 CLINICAL DATA:  Renal insufficiency.  Fluid retention. EXAM: RENAL / URINARY TRACT ULTRASOUND  COMPLETE COMPARISON:  None. FINDINGS: Degraded exam secondary to patient morbid obesity and immobility. Right Kidney: Renal measurements: 11.8 x 4.5 x 6.4 cm = volume: 179 mL. Grossly normal echogenicity. No hydronephrosis. Left Kidney: Renal measurements: 10.0 x 5.9 x 5.9 cm = volume: 182.7 mL. Suboptimally visualized. No gross abnormality identified. No hydronephrosis. Bladder: Appears normal for degree of bladder distention. IMPRESSION: 1. Moderate limitations as detailed above. 2. No hydronephrosis or specific explanation for renal insufficiency. Electronically Signed   By: Abigail Miyamoto M.D.   On: 05/13/2018 15:49   Nm Pulmonary Perf And Vent  Result Date: 05/13/2018 CLINICAL DATA:  Concern for pulmonary embolism. EXAM: NUCLEAR MEDICINE VENTILATION - PERFUSION LUNG SCAN TECHNIQUE: Ventilation images were obtained in multiple projections using inhaled aerosol Tc-4m DTPA. Perfusion images were obtained in multiple projections after intravenous injection of Tc-75m MAA. RADIOPHARMACEUTICALS:  32.3 mCi of Tc-74m DTPA aerosol inhalation and 4.22 mCi Tc53m MAA IV COMPARISON:  Chest radiograph-earlier same day; 05/11/2018 FINDINGS: Review of chest radiograph performed earlier today demonstrates unchanged enlarged cardiac silhouette and mediastinal contours. Chronic pulmonary venous congestion without frank evidence of edema. There is persistent mild elevation of the left hemidiaphragm. No definite pleural effusion or pneumothorax. Ventilation: Ventilatory images demonstrates clumping of inhaled radiotracer about the bilateral pulmonary hila, right greater than left. Ingested radiotracer is seen within the oro and hypopharynx as well as the mid esophagus. Perfusion: Perfusion images demonstrate relative homogeneous distribution of injected radiotracer throughout the bilateral pulmonary parenchyma without discrete area mismatched perfusion to suggest pulmonary embolism. IMPRESSION: Pulmonary embolism absent (very low  probability of pulmonary embolism). Electronically Signed   By: Sandi Mariscal M.D.   On: 05/13/2018 15:21   Vas [place] Lower Extremity Venous (dvt)  Result Date: 05/12/2018  Lower Venous Study Indications: Edema.  Limitations: Body habitus. Comparison Study: No prior study on file Performing Technologist: Sharion Dove RVS  Examination Guidelines: A complete evaluation includes B-mode imaging, spectral Doppler, color Doppler, and power Doppler as needed of all accessible portions of each vessel. Bilateral testing is considered an integral part of a complete examination. Limited examinations for reoccurring indications may be performed as noted.  Right Venous Findings: +---------+---------------+---------+-----------+----------+--------------+          CompressibilityPhasicitySpontaneityPropertiesSummary        +---------+---------------+---------+-----------+----------+--------------+  CFV                                                   visualized     +---------+---------------+---------+-----------+----------+--------------+ FV Prox                                               Not visualized +---------+---------------+---------+-----------+----------+--------------+ FV Mid                                                visualized     +---------+---------------+---------+-----------+----------+--------------+ FV Distal                                             Not visualized +---------+---------------+---------+-----------+----------+--------------+ PFV                                                   Not visualized +---------+---------------+---------+-----------+----------+--------------+ POP                                                   Not visualized +---------+---------------+---------+-----------+----------+--------------+ PTV                                                   Not visualized  +---------+---------------+---------+-----------+----------+--------------+  Left Venous Findings: +---------+---------------+---------+-----------+----------+--------------+          CompressibilityPhasicitySpontaneityPropertiesSummary        +---------+---------------+---------+-----------+----------+--------------+ CFV                                                   Not visualized +---------+---------------+---------+-----------+----------+--------------+ FV Prox                                               Not visualized +---------+---------------+---------+-----------+----------+--------------+ FV Mid                                                Not visualized +---------+---------------+---------+-----------+----------+--------------+ FV Distal  Not visualized +---------+---------------+---------+-----------+----------+--------------+ PFV                                                   Not visualized +---------+---------------+---------+-----------+----------+--------------+ POP      Full           Yes      Yes                                 +---------+---------------+---------+-----------+----------+--------------+ PTV                                                   Not visualized +---------+---------------+---------+-----------+----------+--------------+ PERO                                                  Not visualized +---------+---------------+---------+-----------+----------+--------------+    Summary: Right: There is no evidence of deep vein thrombosis in the lower extremity. However, portions of this examination were limited- see technologist comments above. Left: There is no evidence of deep vein thrombosis in the lower extremity. However, portions of this examination were limited- see technologist comments above.  *See table(s) above for measurements and observations. Electronically  signed by Monica Martinez MD on 05/12/2018 at 4:29:14 PM.    Final    [place] Ekg Site Rite  Result Date: 05/13/2018 If Site Rite image not attached, placement could not be confirmed due to current cardiac rhythm.   Cardiac Studies   2D Echo 05/12/18 IMPRESSIONS    1. The left ventricle has mild-moderately reduced systolic function, with an ejection fraction of 40-45%. The cavity size was normal. There is moderate concentric left ventricular hypertrophy. Left ventricular diastology could not be evaluated Elevated  left ventricular end-diastolic pressure.  2. The right ventricle has moderately reduced systolic function. The cavity was normal. There is no increase in right ventricular wall thickness.  3. Left atrial size was moderately dilated.  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 The MR jet is posteriorly-directed.  5. The tricuspid valve is normal in structure. Tricuspid valve regurgitation is moderate-severe.  6. The aortic valve is tricuspid Mild thickening of the aortic valve Mild calcification of the aortic valve.  7. There is evidence of plaque in the ascending aorta.  8. Pulmonary hypertension is moderate.  9. The inferior vena cava was dilated in size with <50% respiratory variability. 10. Mild hypokinesis of the left ventricular inferior wall.  Patient Profile   Jean Stewart is a 73 y.o. female with a hx of severe morbid obesity (BMI 84), ? blood clot 5 years ago, deconditioning, suspected OSA (per daughter) and HTN who is being seen today for the evaluation of atrial fibrillation with RVR and abnormal echo/CHF at the request of Dr. Wyonia Hough. She is admitted with inability to stand up due to lack of strength, worsening leg swelling, DOE, AF RVR, hypoxia on RA, renal insufficiency with Cr of 2.21 with unknown baseline, and soft BP.  Assessment & Plan  1. Acute combined systolic diastolic CHF: LVEF reduced by  echo 40-45% with diffuse hypokinesis. Chronicity unknown. No prior echos for comparison. Diuretics were increased yesterday due to volume overload, 40 IV Lasix BID. Unfortunately, strict I/Os not being recorded to assess urinary response. Difficult to assess volume due to body habitus/ super morbid obesity. Renal function worsening w/ rising SCr. Dr. Meda Coffee noted yesterday that pt may benefit from inotropes with cardiorenal syndrome however given her comorbidities and morbid obesity, this will probably not improve her mortality overall.  2. Atrial fibrillation with RVR: new diagnosis. Was initially placed on IV Cardizem but discontinued due to hypotension. Now on IV amiodarone. Remains in afib w/ HR in the low 110s. Also on PO metoprolol. BPs soft but stable. Initially unable to get addmtional IV access for IV heparin, and pt was being treated w/ enoxaparin. Now has PICC line. Pharmacy following. Can transition to heparin. Pharmacy to dose. Continue IV amiodarone and metoprolol for now.   3. Acute respiratory failure with hypoxia and hypercapnia: VQ scan yesterday very low probability for PE. Multifactorial, acute on chronic combined systolic and diastolic CHF, obesity hypoventilation syndrome, pulmonary HTN, Afib w/ RVR, mitral and tricuspid valve regurgitation.   4. Obesity hypoventilation syndrome: highly likely obstructive sleep apnea.   5. Mildly Elevated Troponin>>Demand ischemia: trops 0.06>>0.07. In the settings of CHF renal failure  6. Renal failure: unknown chronicity, baseline known. Scr has been trending up this admit 1.99>>2.35>>2.49. ? Cardiorenal syndrome.   7. 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 Tricuspid Regurgitation: moderate to severe by echo.   9. Pulmonary HTN: moderate by echo. Likely 2/2 obesity hyperventilation syndrome and possible OSA.   10. Super Morbid Obesity: Body mass index is 77.24 kg/m.   For  questions or updates, please contact [place] Please consult [email or web address] for contact info under        Signed, Lyda Jester, PA-C  05/14/2018, 11:50 AM

## 2018-05-14 NOTE — Progress Notes (Signed)
ANTICOAGULATION CONSULT NOTE   Pharmacy Consult for lovenox> heparin infusion Indication: atrial fibrillation and f/u DVT  Not on File  Patient Measurements: Height: 5\' 4"  (162.6 cm) Weight: (!) 450 lb (204.1 kg) IBW/kg (Calculated) : 54.7  Heparin dose weight: 114.5 kg   Vital Signs: Temp: 99 F (37.2 C) (03/09 0524) Temp Source: Axillary (03/09 0524) BP: 109/69 (03/09 1038) Pulse Rate: 108 (03/09 1144)  Labs: Recent Labs    05/11/18 1943 05/11/18 2257 05/12/18 0443 05/12/18 1207 05/13/18 0508 05/13/18 1240 05/14/18 0412 05/14/18 1205  HGB  --   --  12.9  --  12.1  --  11.5*  --   HCT  --   --  41.8  --  39.4  --  38.4  --   PLT  --   --  105*  --  102*  --  108*  --   HEPARINUNFRC  --   --   --  1.30*  --   --   --   --   HEPRLOWMOCWT  --   --   --   --   --  1.81  --  1.30  CREATININE  --   --  1.99*  --  2.35*  --  2.49*  --   TROPONINI 0.06* 0.07* 0.07*  --   --   --   --   --     Estimated Creatinine Clearance: 35.8 mL/min (A) (by C-G formula based on SCr of 2.49 mg/dL (H)).   Medical History: Past Medical History:  Diagnosis Date  . Hypertension   . Morbid obesity ([place])   . Physical deconditioning   . Suspected sleep apnea    Assessment: 5 yof presented to the ED with afib and possible DVT. Originally started on enoxaparin- not on anticoagulation PTA.  Given elevated Scr in setting of obesity, plan to change to heparin infusion when IV access obtained and this was done this am. She has been on lovenox with elevated anti-Xa levels -lovenox 110mg  given around midnight -anti Xa level (LMWH)= 1.3  The current LMWH level is higher than anticipated. Goal trough is 0.6-1 typically collected 4 hours post dose  Goal of Therapy:  Heparin level: 0.3-0.7  LMWH level:  ~ 0.6 Monitor platelets by anticoagulation protocol: Yes   Plan:  -No heparin now -Will check an anti-Xa level in am and consider re-starting heparin if level is ~ 0.5-0.6  Hildred Laser,  PharmD Clinical Pharmacist **Pharmacist phone directory can now be found on [email or web address] ([email or web address]).  Listed under [place].

## 2018-05-14 NOTE — Progress Notes (Signed)
Initial Nutrition Assessment  DOCUMENTATION CODES:   Morbid obesity  INTERVENTION:    Pro-stat 30 ml BID, each supplement provides 100 kcal and 15 gm protein   Multivitamin daily  Low sodium diet and weight loss education provided  NUTRITION DIAGNOSIS:   Increased nutrient needs related to wound healing as evidenced by estimated needs.  GOAL:   Patient will meet greater than or equal to 90% of their needs  MONITOR:   PO intake, Supplement acceptance, Labs, Skin  REASON FOR ASSESSMENT:   Consult Diet education  ASSESSMENT:   73 yo female with PMH of morbid obesity, HTN who was admitted with weakness and LE swelling, A fib with RVR. Chest x-ray showed cardiomegaly.   Received consult for diet education regarding obesity and CHF. Patient sleeping, but awakened easily. Provided "Heart Failure Nutrition Therapy" and "Weight Loss Tips" handouts. She kept falling back asleep while RD was trying to review dietary restrictions. She was not very receptive to education. She agreed to take the handouts and review them at a later time.   Labs reviewed. Medications reviewed and include Lasix.   Patient with increased protein needs to support wound healing and promote optimal skin integrity. Will add Pro-stat to maximize protein intake.   NUTRITION - FOCUSED PHYSICAL EXAM:    Most Recent Value  Orbital Region  No depletion  Upper Arm Region  No depletion  Thoracic and Lumbar Region  No depletion  Buccal Region  No depletion  Temple Region  No depletion  Clavicle Bone Region  No depletion  Clavicle and Acromion Bone Region  No depletion  Scapular Bone Region  No depletion  Dorsal Hand  No depletion  Patellar Region  No depletion  Anterior Thigh Region  Unable to assess  Posterior Calf Region  No depletion  Edema (RD Assessment)  Moderate  Hair  Reviewed  Eyes  Reviewed  Mouth  Reviewed  Skin  Reviewed  Nails  Reviewed       Diet Order:   Diet Order           Diet Heart Room service appropriate? Yes with Assist; Fluid consistency: Thin  Diet effective now              EDUCATION NEEDS:   Education needs have been addressed  Skin:  Skin Assessment: Skin Integrity Issues: Skin Integrity Issues:: Stage II, Other (Comment) Stage II: bilateral thighs Other: MASD to abd, breast, buttocks; blisters to feet  Last BM:  PTA  Height:   Ht Readings from Last 1 Encounters:  05/13/18 5\' 4"  (1.626 m)    Weight:   Wt Readings from Last 1 Encounters:  05/14/18 (!) 204.1 kg    Ideal Body Weight:  54.5 kg  BMI:  Body mass index is 77.24 kg/m.  Estimated Nutritional Needs:   Kcal:  1900-2200  Protein:  110-135 gm  Fluid:  1.5 L    Molli Barrows, RD, LDN, CNSC Pager [phone] After Hours Pager [phone]

## 2018-05-14 NOTE — Consult Note (Signed)
[facility] Nurse wound consult note Reason for Consult: dermatitis/skin lesions.  I have reviewed the chart and today when I visited the patient I do not really see any lesions on her legs, she is reported to have MASD, she is reported to have ITD Wound type: Incontinence associated skin damage buttocks and inner thighs.  Partial thickness skin loss noted on the left ischial area and buttock.  Intertriginous skin damage; inframammary and under the pannus, severe skin damage under the right side of her pannus, raw and weeping  Difficult to determine but patient appears to be bed bound. She is not able to tell me how she gets up at home and if she gets up. In the current bed she has inpatient we are limited on the ways in which we can turn her because of her size and girth. With three nurses including myself we were able to turn her but we could not turn her the other direction to remove underpads due to limited space in the bed. The patient does not assist with turning   Pressure Injury POA: NA Measurement: unable to measure Wound RAQ:TMAUQ, pink, moist, partial thickness skin loss left buttock; partial thickness skin loss under the pannus  Drainage (amount, consistency, odor) yeast odor from under the pannus  Periwound: intact  Dressing procedure/placement/frequency: Unfortunatly I was only able to see the buttocks for a very short period of time. Patient is incontinent and the staff are using a PureWick to manage. However it has been leaking. She is very difficult to turn for care and assessment. The bed while bariatric does not offer much room for turning due to the patient's girth.  Will add triple paste as a moisture barrier to the incontinence. Antimicrobial wicking fabric in place under the breast and pannus for moisture management and fungal overgrowth.   Discussed POC with bedside nurse.  Re consult if needed, will not follow at this time. Thanks  Shresta Risden [employer], RN,CWOCN, CNS, [place]  [phone])

## 2018-05-14 NOTE — Progress Notes (Signed)
PROGRESS NOTE    Jean Stewart  ZOX:[id number] DOB: 10-11-1945 DOA: 05/11/2018 PCP: Patient, No Pcp Per   Brief Narrative:  73 yo AAF with hx of htn, and obesity who is a VERY poor historian. Presented to the ER with complaints of generalized weakness and significant LE swelling. She subsequently reported its been ongoing for over a year. She was seen by hospitalist service and admitted for further eval ED Course:Patient was found in the ER with A. fib with RVR, heart rate as high as 129, blood pressure is low normal 97/57. She was saturating 88% on room air and was put on 4 L of oxygen. Troponin 0 0.06. Creatinine 2.21, baseline unknown.A chest x-ray shows cardiomegaly. Twelve-lead EKG shows A. fib with RVR.Patient was started on Cardizem infusion in the ER.   Assessment & Plan:   Principal Problem:   Atrial fibrillation with RVR (HCC) Active Problems:   Obesity, Class III, BMI 40-49.9 (morbid obesity) (HCC)   Essential hypertension   Renal insufficiency   New onset a-fib (HCC)   Acute respiratory failure with hypoxia and hypercapnia (HCC)   Elevated troponin   Pressure injury of skin   1.Acute respiratory failure with hypoxia and hypercapnia:likelymultifactorial withundiagnosed underlying sleep apnea, obesity hypoventialtion,congestive heart failure systolic 81% ef and new onset A. Fib. Patienthas acute/chronicrespiratory acidosis.pt has been weaned offBiPAP, repeat ABG in the morning with min change.  Evaluation for PE shows negative for presence of DVT. VQ scan low probability,  Depending upon the progress, she will need assist device at home, will need referral for sleep apnea study on discharge. May need supplemental oxygen to go home.  2.A. fib with XBJ:YNWGNFA chroncity.weaned off dilt gtt given her reduced EF, C/Wmetoprolol with parameters. Patient is mostly asymptomatic. Has borderline elevated troponinsthat have remained flat. Currently  without any chest pain. echocardiogramshows systolic heart failure EF 40-45,Blood pressures are soft,will try diuresis with parameters, discussed with cardiology who saw in consultation.  They added amiodarone, heparin drip pursuing a VQ scan.  Increase diuresis to 40 IV twice daily.  3.Renal insufficiency: Unknown whether acute or chronic, suspect chronic. No previous records available.cr 2.49 this morning-as noted suspect this is chronic for her, will check creatinine tomorrow before involving nephrology.  A1C 6, lipid profile shows excellent control.    4.Morbid obesity: BMI 78, As stated above. Will need sleep apnea evaluation.  We will add PT OT eval today for possible placement for generalized weakness associated with patient's deconditioning and morbid obesity  5.CHF exac with systolic dysfuntion 21-30% EF with bilateral leg swelling: Pt reported ongoing problem for >1 year although recently worsening, on exam not significant pitting x-ray without significant edema,   6. Dermatitis: Suspect dermatitis. Less likely underlying soft tissue infection. Will start with nystatin powder. Will consult wound care.  Medical decision making: Patient requires continued inpatient management with IV heparin, IV Lasix, electrolyte monitoring, additional respiratory support with supplemental O2 and expert consultation with cardiology for the following: -Congestive heart failure acute systolic -A. fib RVR requiring IV treatment   DVT prophylaxis: On:HEP GTT  Code Status: FULL    Code Status Orders  (From admission, onward)         Start     Ordered   05/11/18 2214  Full code  Continuous     05/11/18 2213        Code Status History    This patient has a current code status but no historical code status.     Family Communication:  Daughter at bedside Disposition Plan:   PT OT eval for possible placement skilled nurse facility, patient require additional inpatient  treatment as noted above Consults called: None Admission status: Inpatient   Consultants:   Cardiology  Procedures:  Dg Chest 2 View  Result Date: 05/13/2018 CLINICAL DATA:  Shortness of breath. EXAM: CHEST - 2 VIEW COMPARISON:  05/11/2018 FINDINGS: Examination is degraded due to patient habitus. Grossly unchanged enlarged cardiac silhouette and mediastinal contours. Pulmonary vasculature appears slightly less distinct on the present examination with cephalization of flow. There is minimal pleuroparenchymal thickening about the bilateral major fissures. Minimal perihilar heterogeneous opacities favored to represent atelectasis. No new focal airspace opacities. Trace pleural effusions are not excluded. No pneumothorax. No acute osseous abnormalities. IMPRESSION: Cardiomegaly with suspected mild pulmonary edema and trace bilateral effusions on this body habitus degraded examination. Electronically Signed   By: Sandi Mariscal M.D.   On: 05/13/2018 15:23   US Renal  Result Date: 05/13/2018 CLINICAL DATA:  Renal insufficiency.  Fluid retention. EXAM: RENAL / URINARY TRACT ULTRASOUND COMPLETE COMPARISON:  None. FINDINGS: Degraded exam secondary to patient morbid obesity and immobility. Right Kidney: Renal measurements: 11.8 x 4.5 x 6.4 cm = volume: 179 mL. Grossly normal echogenicity. No hydronephrosis. Left Kidney: Renal measurements: 10.0 x 5.9 x 5.9 cm = volume: 182.7 mL. Suboptimally visualized. No gross abnormality identified. No hydronephrosis. Bladder: Appears normal for degree of bladder distention. IMPRESSION: 1. Moderate limitations as detailed above. 2. No hydronephrosis or specific explanation for renal insufficiency. Electronically Signed   By: Abigail Miyamoto M.D.   On: 05/13/2018 15:49   Nm Pulmonary Perf And Vent  Result Date: 05/13/2018 CLINICAL DATA:  Concern for pulmonary embolism. EXAM: NUCLEAR MEDICINE VENTILATION - PERFUSION LUNG SCAN TECHNIQUE: Ventilation images were obtained in multiple  projections using inhaled aerosol Tc-33m DTPA. Perfusion images were obtained in multiple projections after intravenous injection of Tc-4m MAA. RADIOPHARMACEUTICALS:  32.3 mCi of Tc-65m DTPA aerosol inhalation and 4.22 mCi Tc26m MAA IV COMPARISON:  Chest radiograph-earlier same day; 05/11/2018 FINDINGS: Review of chest radiograph performed earlier today demonstrates unchanged enlarged cardiac silhouette and mediastinal contours. Chronic pulmonary venous congestion without frank evidence of edema. There is persistent mild elevation of the left hemidiaphragm. No definite pleural effusion or pneumothorax. Ventilation: Ventilatory images demonstrates clumping of inhaled radiotracer about the bilateral pulmonary hila, right greater than left. Ingested radiotracer is seen within the oro and hypopharynx as well as the mid esophagus. Perfusion: Perfusion images demonstrate relative homogeneous distribution of injected radiotracer throughout the bilateral pulmonary parenchyma without discrete area mismatched perfusion to suggest pulmonary embolism. IMPRESSION: Pulmonary embolism absent (very low probability of pulmonary embolism). Electronically Signed   By: Sandi Mariscal M.D.   On: 05/13/2018 15:21   Dg Chest Port 1 View  Result Date: 05/11/2018 CLINICAL DATA:  Shortness of breath. Hypertension. EXAM: PORTABLE CHEST 1 VIEW COMPARISON:  None. FINDINGS: Mildly degraded exam due to AP portable technique and patient body habitus. Apical lordotic positioning. Midline trachea. Cardiomegaly accentuated by AP portable technique. Mild left hemidiaphragm elevation. No right and no definite left pleural effusion. Numerous leads and wires project over the chest. No pneumothorax. No congestive failure. Relatively low lung volumes. This accentuates the pulmonary interstitium. IMPRESSION: Cardiomegaly and low lung volumes, without acute disease. Electronically Signed   By: Abigail Miyamoto M.D.   On: 05/11/2018 13:47   Vas [place] Lower  Extremity Venous (dvt)  Result Date: 05/12/2018  Lower Venous Study Indications: Edema.  Limitations: Body habitus. Comparison Study:  No prior study on file Performing Technologist: Sharion Dove RVS  Examination Guidelines: A complete evaluation includes B-mode imaging, spectral Doppler, color Doppler, and power Doppler as needed of all accessible portions of each vessel. Bilateral testing is considered an integral part of a complete examination. Limited examinations for reoccurring indications may be performed as noted.  Right Venous Findings: +---------+---------------+---------+-----------+----------+--------------+          CompressibilityPhasicitySpontaneityPropertiesSummary        +---------+---------------+---------+-----------+----------+--------------+ CFV                                                   visualized     +---------+---------------+---------+-----------+----------+--------------+ FV Prox                                               Not visualized +---------+---------------+---------+-----------+----------+--------------+ FV Mid                                                visualized     +---------+---------------+---------+-----------+----------+--------------+ FV Distal                                             Not visualized +---------+---------------+---------+-----------+----------+--------------+ PFV                                                   Not visualized +---------+---------------+---------+-----------+----------+--------------+ POP                                                   Not visualized +---------+---------------+---------+-----------+----------+--------------+ PTV                                                   Not visualized +---------+---------------+---------+-----------+----------+--------------+  Left Venous Findings: +---------+---------------+---------+-----------+----------+--------------+           CompressibilityPhasicitySpontaneityPropertiesSummary        +---------+---------------+---------+-----------+----------+--------------+ CFV                                                   Not visualized +---------+---------------+---------+-----------+----------+--------------+ FV Prox                                               Not visualized +---------+---------------+---------+-----------+----------+--------------+ FV Mid  Not visualized +---------+---------------+---------+-----------+----------+--------------+ FV Distal                                             Not visualized +---------+---------------+---------+-----------+----------+--------------+ PFV                                                   Not visualized +---------+---------------+---------+-----------+----------+--------------+ POP      Full           Yes      Yes                                 +---------+---------------+---------+-----------+----------+--------------+ PTV                                                   Not visualized +---------+---------------+---------+-----------+----------+--------------+ PERO                                                  Not visualized +---------+---------------+---------+-----------+----------+--------------+    Summary: Right: There is no evidence of deep vein thrombosis in the lower extremity. However, portions of this examination were limited- see technologist comments above. Left: There is no evidence of deep vein thrombosis in the lower extremity. However, portions of this examination were limited- see technologist comments above.  *See table(s) above for measurements and observations. Electronically signed by Monica Martinez MD on 05/12/2018 at 4:29:14 PM.    Final    [place] Ekg Site Rite  Result Date: 05/13/2018 If Site Rite image not attached, placement could not be confirmed  due to current cardiac rhythm.    Antimicrobials:   None   Subjective: Patient still with some A. fib RVR overnight limited treatment secondary to hypotension.  Patient without acute complaints.  Family at bedside answered all questions  Objective: Vitals:   05/14/18 0900 05/14/18 1038 05/14/18 1100 05/14/18 1144  BP:  109/69    Pulse: (!) 116 (!) 119 (!) 115 (!) 108  Resp: 20 (!) 21 (!) 27 20  Temp:      TempSrc:      SpO2: 91% 93% 95% 98%  Weight:      Height:        Intake/Output Summary (Last 24 hours) at 05/14/2018 1306 Last data filed at 05/14/2018 1040 Gross per 24 hour  Intake 698.15 ml  Output -  Net 698.15 ml   Filed Weights   05/13/18 0634 05/14/18 0524  Weight: (!) 222.3 kg (!) 204.1 kg    Examination:  General exam: Appears calm and comfortable  Respiratory system: Limited exam secondary body habitus scattered rhonchi trace,. Cardiovascular system: Irregularly irregular, rapid rate, no murmurs auscultated with a very limited exam secondary to body habitus  gastrointestinal system: Abdomen is nondistended, soft and nontender. No organomegaly or masses felt. Normal bowel sounds heard. Central nervous system: Alert and oriented. No focal neurological deficits. Extremities: Symmetric 5 x 5 power.  pitting edema Skin: No rashes, lesions or ulcers Psychiatry: Poor insight. Mood & affect appropriate.     Data Reviewed: I have personally reviewed following labs and imaging studies  CBC: Recent Labs  Lab 05/11/18 1325 05/11/18 1735 05/12/18 0443 05/13/18 0508 05/14/18 0412  WBC 7.9  --  8.4 9.0 7.1  NEUTROABS 6.3  --   --  7.0 5.4  HGB 13.1 12.9 12.9 12.1 11.5*  HCT 42.2 38.0 41.8 39.4 38.4  MCV 101.4*  --  101.2* 101.8* 101.1*  PLT 110*  --  105* 102* 409*   Basic Metabolic Panel: Recent Labs  Lab 05/11/18 1325 05/11/18 1735 05/12/18 0443 05/13/18 0508 05/14/18 0412  NA 142 141 141 139 137  K 4.3 4.4 4.7 4.7 4.6  CL 104  --  102 100 102    CO2 29  --  24 28 28   GLUCOSE 100*  --  89 111* 112*  BUN 36*  --  40* 47* 51*  CREATININE 2.21*  --  1.99* 2.35* 2.49*  CALCIUM 9.0  --  8.8* 8.4* 8.3*   GFR: Estimated Creatinine Clearance: 35.8 mL/min (A) (by C-G formula based on SCr of 2.49 mg/dL (H)). Liver Function Tests: Recent Labs  Lab 05/11/18 1325  AST 46*  ALT 30  ALKPHOS 66  BILITOT 1.2  PROT 6.6  ALBUMIN 3.4*   No results for input(s): LIPASE, AMYLASE in the last 168 hours. No results for input(s): AMMONIA in the last 168 hours. Coagulation Profile: No results for input(s): INR, PROTIME in the last 168 hours. Cardiac Enzymes: Recent Labs  Lab 05/11/18 1325 05/11/18 1943 05/11/18 2257 05/12/18 0443  TROPONINI 0.06* 0.06* 0.07* 0.07*   BNP (last 3 results) No results for input(s): PROBNP in the last 8760 hours. HbA1C: Recent Labs    05/11/18 1943  HGBA1C 6.0*   CBG: No results for input(s): GLUCAP in the last 168 hours. Lipid Profile: Recent Labs    05/11/18 1943  CHOL 119  HDL 41  LDLCALC 66  TRIG 62  CHOLHDL 2.9   Thyroid Function Tests: Recent Labs    05/11/18 1352  TSH 1.166   Anemia Panel: No results for input(s): VITAMINB12, FOLATE, FERRITIN, TIBC, IRON, RETICCTPCT in the last 72 hours. Sepsis Labs: No results for input(s): PROCALCITON, LATICACIDVEN in the last 168 hours.  No results found for this or any previous visit (from the past 240 hour(s)).       Radiology Studies: Dg Chest 2 View  Result Date: 05/13/2018 CLINICAL DATA:  Shortness of breath. EXAM: CHEST - 2 VIEW COMPARISON:  05/11/2018 FINDINGS: Examination is degraded due to patient habitus. Grossly unchanged enlarged cardiac silhouette and mediastinal contours. Pulmonary vasculature appears slightly less distinct on the present examination with cephalization of flow. There is minimal pleuroparenchymal thickening about the bilateral major fissures. Minimal perihilar heterogeneous opacities favored to represent  atelectasis. No new focal airspace opacities. Trace pleural effusions are not excluded. No pneumothorax. No acute osseous abnormalities. IMPRESSION: Cardiomegaly with suspected mild pulmonary edema and trace bilateral effusions on this body habitus degraded examination. Electronically Signed   By: Sandi Mariscal M.D.   On: 05/13/2018 15:23   US Renal  Result Date: 05/13/2018 CLINICAL DATA:  Renal insufficiency.  Fluid retention. EXAM: RENAL / URINARY TRACT ULTRASOUND COMPLETE COMPARISON:  None. FINDINGS: Degraded exam secondary to patient morbid obesity and immobility. Right Kidney: Renal measurements: 11.8 x 4.5 x 6.4 cm = volume: 179 mL. Grossly normal echogenicity. No hydronephrosis. Left Kidney: Renal  measurements: 10.0 x 5.9 x 5.9 cm = volume: 182.7 mL. Suboptimally visualized. No gross abnormality identified. No hydronephrosis. Bladder: Appears normal for degree of bladder distention. IMPRESSION: 1. Moderate limitations as detailed above. 2. No hydronephrosis or specific explanation for renal insufficiency. Electronically Signed   By: Abigail Miyamoto M.D.   On: 05/13/2018 15:49   Nm Pulmonary Perf And Vent  Result Date: 05/13/2018 CLINICAL DATA:  Concern for pulmonary embolism. EXAM: NUCLEAR MEDICINE VENTILATION - PERFUSION LUNG SCAN TECHNIQUE: Ventilation images were obtained in multiple projections using inhaled aerosol Tc-29m DTPA. Perfusion images were obtained in multiple projections after intravenous injection of Tc-88m MAA. RADIOPHARMACEUTICALS:  32.3 mCi of Tc-48m DTPA aerosol inhalation and 4.22 mCi Tc57m MAA IV COMPARISON:  Chest radiograph-earlier same day; 05/11/2018 FINDINGS: Review of chest radiograph performed earlier today demonstrates unchanged enlarged cardiac silhouette and mediastinal contours. Chronic pulmonary venous congestion without frank evidence of edema. There is persistent mild elevation of the left hemidiaphragm. No definite pleural effusion or pneumothorax. Ventilation: Ventilatory  images demonstrates clumping of inhaled radiotracer about the bilateral pulmonary hila, right greater than left. Ingested radiotracer is seen within the oro and hypopharynx as well as the mid esophagus. Perfusion: Perfusion images demonstrate relative homogeneous distribution of injected radiotracer throughout the bilateral pulmonary parenchyma without discrete area mismatched perfusion to suggest pulmonary embolism. IMPRESSION: Pulmonary embolism absent (very low probability of pulmonary embolism). Electronically Signed   By: Sandi Mariscal M.D.   On: 05/13/2018 15:21   Vas [place] Lower Extremity Venous (dvt)  Result Date: 05/12/2018  Lower Venous Study Indications: Edema.  Limitations: Body habitus. Comparison Study: No prior study on file Performing Technologist: Sharion Dove RVS  Examination Guidelines: A complete evaluation includes B-mode imaging, spectral Doppler, color Doppler, and power Doppler as needed of all accessible portions of each vessel. Bilateral testing is considered an integral part of a complete examination. Limited examinations for reoccurring indications may be performed as noted.  Right Venous Findings: +---------+---------------+---------+-----------+----------+--------------+          CompressibilityPhasicitySpontaneityPropertiesSummary        +---------+---------------+---------+-----------+----------+--------------+ CFV                                                   visualized     +---------+---------------+---------+-----------+----------+--------------+ FV Prox                                               Not visualized +---------+---------------+---------+-----------+----------+--------------+ FV Mid                                                visualized     +---------+---------------+---------+-----------+----------+--------------+ FV Distal                                             Not visualized  +---------+---------------+---------+-----------+----------+--------------+ PFV  Not visualized +---------+---------------+---------+-----------+----------+--------------+ POP                                                   Not visualized +---------+---------------+---------+-----------+----------+--------------+ PTV                                                   Not visualized +---------+---------------+---------+-----------+----------+--------------+  Left Venous Findings: +---------+---------------+---------+-----------+----------+--------------+          CompressibilityPhasicitySpontaneityPropertiesSummary        +---------+---------------+---------+-----------+----------+--------------+ CFV                                                   Not visualized +---------+---------------+---------+-----------+----------+--------------+ FV Prox                                               Not visualized +---------+---------------+---------+-----------+----------+--------------+ FV Mid                                                Not visualized +---------+---------------+---------+-----------+----------+--------------+ FV Distal                                             Not visualized +---------+---------------+---------+-----------+----------+--------------+ PFV                                                   Not visualized +---------+---------------+---------+-----------+----------+--------------+ POP      Full           Yes      Yes                                 +---------+---------------+---------+-----------+----------+--------------+ PTV                                                   Not visualized +---------+---------------+---------+-----------+----------+--------------+ PERO                                                  Not visualized  +---------+---------------+---------+-----------+----------+--------------+    Summary: Right: There is no evidence of deep vein thrombosis in the lower extremity. However, portions of this examination were limited- see technologist comments above. Left: There is no evidence of deep vein thrombosis in the lower extremity.  However, portions of this examination were limited- see technologist comments above.  *See table(s) above for measurements and observations. Electronically signed by Monica Martinez MD on 05/12/2018 at 4:29:14 PM.    Final    [place] Ekg Site Rite  Result Date: 05/13/2018 If Site Rite image not attached, placement could not be confirmed due to current cardiac rhythm.       Scheduled Meds: . amiodarone  200 mg Oral BID  . aspirin EC  81 mg Oral Daily  . furosemide  40 mg Intravenous BID  . metoprolol tartrate  37.5 mg Oral BID  . nystatin cream   Topical BID  . sodium chloride flush  10-40 mL Intracatheter Q12H   Continuous Infusions: . sodium chloride       LOS: 3 days    Time spent: [place]    Nicolette Bang, MD [facility]  If 7PM-7AM, please contact night-coverage  05/14/2018, 1:06 PM

## 2018-05-14 NOTE — Evaluation (Signed)
Physical Therapy Evaluation Patient Details Name: Jean Stewart MRN: [id number] DOB: [birth year] Today's Date: 05/14/2018   History of Present Illness  73 yo female with onset of SOB, pulm edema, pleural effusion, a-fib with RVR, and LE edema with elevated troponin was admitted and found to have skin breakdown in skin folds.  Has renal insufficiency, potentially has sleep apnea.  PMHx:  cardiomegaly, HTN, morbid obesity, LE dermatitis,   Clinical Impression  Pt was seen for mobility and strength testing and noted her struggle to sit up on side of bed, as well as being unable to reposition herself.  Was supposed to be bedbound at baseline and may have been based on her condition but per pt was walking short trips on RW.  Will continue to work on mobility as tolerated but will recommend SNF with the concern that she is appropriate for LTC in SNF setting.    Follow Up Recommendations SNF    Equipment Recommendations  None recommended by PT    Recommendations for Other Services       Precautions / Restrictions Precautions Precautions: Fall(telemetry) Precaution Comments: purwick, IV Restrictions Weight Bearing Restrictions: No      Mobility  Bed Mobility Overal bed mobility: Needs Assistance Bed Mobility: Supine to Sit;Sit to Supine     Supine to sit: Max assist Sit to supine: Max assist   General bed mobility comments: max to relocate to side of bed and max to return legs to bed and max to scoot up bed in trendelenburg  Transfers Overall transfer level: Needs assistance               General transfer comment: pt cannot stand or sidescoot  Ambulation/Gait             General Gait Details: unable to stand  Stairs            Wheelchair Mobility    Modified Rankin (Stroke Patients Only)       Balance Overall balance assessment: Needs assistance Sitting-balance support: Feet supported;Bilateral upper extremity supported Sitting balance-Leahy Scale:  Poor                                       Pertinent Vitals/Pain Pain Assessment: No/denies pain    Home Living Family/patient expects to be discharged to:: Skilled nursing facility Living Arrangements: Alone                    Prior Function Level of Independence: Needs assistance   Gait / Transfers Assistance Needed: used RW for very short trips(by pt report)  ADL's / Homemaking Assistance Needed: Had family to assist her with house  Comments: pt is quite dependent with poor skin care and appears bedridden     Hand Dominance   Dominant Hand: Right    Extremity/Trunk Assessment   Upper Extremity Assessment Upper Extremity Assessment: Generalized weakness    Lower Extremity Assessment Lower Extremity Assessment: Generalized weakness    Cervical / Trunk Assessment Cervical / Trunk Assessment: Kyphotic  Communication   Communication: No difficulties  Cognition Arousal/Alertness: Lethargic Behavior During Therapy: Flat affect Overall Cognitive Status: No family/caregiver present to determine baseline cognitive functioning                                 General Comments: pt is somewhat confused and requires  help to sit, cannot give a good history      General Comments General comments (skin integrity, edema, etc.): pt sits with LE edema mainly of thighs and is in ER on hips    Exercises     Assessment/Plan    PT Assessment Patient needs continued PT services  PT Problem List Decreased strength;Decreased range of motion;Decreased activity tolerance;Decreased balance;Decreased mobility;Decreased coordination;Decreased knowledge of use of DME;Decreased safety awareness;Decreased skin integrity       PT Treatment Interventions DME instruction;Gait training;Functional mobility training;Therapeutic activities;Therapeutic exercise;Balance training;Neuromuscular re-education;Patient/family education    PT Goals (Current goals can  be found in the Care Plan section)  Acute Rehab PT Goals Patient Stated Goal: none stated PT Goal Formulation: With patient Time For Goal Achievement: 05/28/18 Potential to Achieve Goals: Fair    Frequency Min 2X/week   Barriers to discharge Decreased caregiver support pt does not have clear history information about her PLOF    Co-evaluation               AM-PAC PT "6 Clicks" Mobility  Outcome Measure Help needed turning from your back to your side while in a flat bed without using bedrails?: A Lot Help needed moving from lying on your back to sitting on the side of a flat bed without using bedrails?: A Lot Help needed moving to and from a bed to a chair (including a wheelchair)?: A Lot Help needed standing up from a chair using your arms (e.g., wheelchair or bedside chair)?: Total Help needed to walk in hospital room?: Total Help needed climbing 3-5 steps with a railing? : Total 6 Click Score: 9    End of Session Equipment Utilized During Treatment: Oxygen Activity Tolerance: Patient limited by fatigue;Treatment limited secondary to medical complications (Comment) Patient left: in bed;with call bell/phone within reach;with bed alarm set Nurse Communication: Mobility status PT Visit Diagnosis: Muscle weakness (generalized) (M62.81);Difficulty in walking, not elsewhere classified (R26.2);Adult, failure to thrive (R62.7)    Time: 7106-2694 PT Time Calculation (min) (ACUTE ONLY): 23 min   Charges:   PT Evaluation $PT Eval Moderate Complexity: 1 Mod PT Treatments $Therapeutic Activity: 8-22 mins       Ramond Dial 05/14/2018, 8:16 PM   Mee Hives, PT MS Acute Rehab Dept. Number: [place] [facility] [place]

## 2018-05-14 NOTE — Progress Notes (Signed)
Patient resting comfortably on 3L [place]. No respiratory distress noted. BIPAP is not needed at this time. RT will monitor as needed.

## 2018-05-14 NOTE — Progress Notes (Signed)
Peripherally Inserted Central Catheter/Midline Placement  The IV Nurse has discussed with the patient and/or persons authorized to consent for the patient, the purpose of this procedure and the potential benefits and risks involved with this procedure.  The benefits include less needle sticks, lab draws from the catheter, and the patient may be discharged home with the catheter. Risks include, but not limited to, infection, bleeding, blood clot (thrombus formation), and puncture of an artery; nerve damage and irregular heartbeat and possibility to perform a PICC exchange if needed/ordered by physician.  Alternatives to this procedure were also discussed.  Bard Power PICC patient education guide, fact sheet on infection prevention and patient information card has been provided to patient /or left at bedside.    PICC/Midline Placement Documentation  PICC Double Lumen 49/35/52 PICC Right Basilic 50 cm 0 cm (Active)  Indication for Insertion or Continuance of Line Vasoactive infusions 05/14/2018  8:54 AM  Exposed Catheter (cm) 0 cm 05/14/2018  8:54 AM  Site Assessment Clean;Dry;Intact 05/14/2018  8:54 AM  Lumen #1 Status Flushed;Blood return noted 05/14/2018  8:54 AM  Lumen #2 Status Flushed;Blood return noted 05/14/2018  8:54 AM  Dressing Type Transparent 05/14/2018  8:54 AM  Dressing Status Clean;Dry;Intact;Antimicrobial disc in place 05/14/2018  8:54 AM  Dressing Intervention New dressing 05/14/2018  8:54 AM  Dressing Change Due 05/21/18 05/14/2018  8:54 AM       Jean Stewart 05/14/2018, 8:56 AM

## 2018-05-15 DIAGNOSIS — N179 Acute kidney failure, unspecified: Secondary | ICD-10-CM

## 2018-05-15 DIAGNOSIS — N183 Chronic kidney disease, stage 3 (moderate): Secondary | ICD-10-CM

## 2018-05-15 DIAGNOSIS — I5043 Acute on chronic combined systolic (congestive) and diastolic (congestive) heart failure: Secondary | ICD-10-CM

## 2018-05-15 LAB — BASIC METABOLIC PANEL
Anion gap: 7 (ref 5–15)
BUN: 54 mg/dL — ABNORMAL HIGH (ref 8–23)
CO2: 29 mmol/L (ref 22–32)
Calcium: 8.7 mg/dL — ABNORMAL LOW (ref 8.9–10.3)
Chloride: 99 mmol/L (ref 98–111)
Creatinine, Ser: 2.32 mg/dL — ABNORMAL HIGH (ref 0.44–1.00)
GFR calc Af Amer: 23 mL/min — ABNORMAL LOW (ref >60)
GFR calc non Af Amer: 20 mL/min — ABNORMAL LOW (ref >60)
GLUCOSE: 103 mg/dL — AB (ref 70–99)
Potassium: 4.6 mmol/L (ref 3.5–5.1)
Sodium: 135 mmol/L (ref 135–145)

## 2018-05-15 LAB — PROTIME-INR
INR: 1.3 — ABNORMAL HIGH (ref 0.8–1.2)
Prothrombin Time: 16.1 seconds — ABNORMAL HIGH (ref 11.4–15.2)

## 2018-05-15 LAB — HEPARIN ANTI-XA: Heparin LMW: 0.67 IU/mL

## 2018-05-15 MED ORDER — METOPROLOL SUCCINATE ER 100 MG PO TB24
100.0000 mg | ORAL_TABLET | Freq: Every day | ORAL | Status: DC
  Administered 2018-05-15 – 2018-05-19 (×4): 100 mg via ORAL
  Filled 2018-05-15 (×5): qty 1

## 2018-05-15 MED ORDER — WARFARIN - PHARMACIST DOSING INPATIENT
Freq: Every day | Status: DC
  Administered 2018-05-16 – 2018-05-17 (×2)

## 2018-05-15 MED ORDER — WARFARIN SODIUM 7.5 MG PO TABS
7.5000 mg | ORAL_TABLET | Freq: Once | ORAL | Status: AC
  Administered 2018-05-15: 7.5 mg via ORAL
  Filled 2018-05-15: qty 1

## 2018-05-15 MED ORDER — HEPARIN (PORCINE) 25000 UT/250ML-% IV SOLN
1150.0000 [IU]/h | INTRAVENOUS | Status: DC
  Administered 2018-05-15 – 2018-05-17 (×3): 1150 [IU]/h via INTRAVENOUS
  Filled 2018-05-15 (×3): qty 250

## 2018-05-15 NOTE — Progress Notes (Signed)
Chaplain responded to spiritual consult-Patient wants to complete HCPOA.  When chaplain came, patient was asleep.  Daughter said that [place] had already been completed at another place, naming her as HCPOA but it could not be located. Chaplain explained to daughter that the form needed to be understood and signed by the patient and daughter affirmed her mother's ability.   Daughter aroused mother with loud voice but patient seemed very groggy and chaplain could not ascertain what she said. Chaplain explained to daughter that the patient must be able to understand what she is signing as it is a legal document.  "Can we take this form with [place]?" she asked" and do this at a later time?"  "Yes" said chaplain. Chaplain also explained that if the patient was unmarried, that her daughter, as Donald Prose would be asked to make health decisions for her mother if form is not completed. Chaplain will be available.  Tamsen Snider Pager [phone]

## 2018-05-15 NOTE — Progress Notes (Signed)
PROGRESS NOTE    [name]  WCH:[id number] DOB: 1945/04/26 DOA: 05/11/2018 PCP: Patient, No Pcp Per   Brief Narrative:  73 yo AAF with hx of htn, and obesity who is a VERY poor historian. Presented to the ER with complaints of generalized weakness and significant LE swelling. She subsequently reported its been ongoing for over a year. She was seen by hospitalist service and admitted for further eval ED Course:Patient was found in the ER with A. fib with RVR, heart rate as high as 129, blood pressure is low normal 97/57. She was saturating 88% on room air and was put on 4 L of oxygen. Troponin 0 0.06. Creatinine 2.21, baseline unknown.A chest x-ray shows cardiomegaly. Twelve-lead EKG shows A. fib with RVR.Patient was started on Cardizem infusion in the ER  Overnight: Patient was weaned off BiPAP, drips have been discontinued, medication converted to p.o.  Patient being seen by PT with recommendation of skilled nursing facility.  Patient's somewhat nonadherent with physical therapy today and extensive discussion on the need to comply with physical therapy for evaluation and possible placement versus disposition home.  Patient agreed to work with physical therapy today   Assessment & Plan:   Principal Problem:   Atrial fibrillation with RVR ([place]) Active Problems:   Obesity, Class III, BMI 40-49.9 (morbid obesity) (HCC)   Essential hypertension   Renal insufficiency   New onset a-fib (HCC)   Acute respiratory failure with hypoxia and hypercapnia (HCC)   Elevated troponin   Pressure injury of skin   1.Acute respiratory failure with hypoxia and hypercapnia:likelymultifactorial withundiagnosed underlying sleep apnea, obesity hypoventialtion,congestive heart failuresystolic 35% ef andnew onset A. Fib. Patienthas acute/chronicrespiratory acidosis.pt has been weaned offBiPAP, repeat ABG in the morning with min change.  Evaluation for PE shows negativefor presence of  DVT. VQ scan low probability,  Depending upon the progress, she will need assist device at home, will need referral for sleep apnea study on discharge. May need supplemental oxygen to go home, continue to wean today  2.A. fib with TIR:WERXVQM chroncity.weaned offdilt gtt given her reduced EF, C/Wmetoprolol with parameters convert to long-acting. Patient is mostly asymptomatic. Has borderline elevated troponinsthat have remained flat. Currently without any chest pain.  Patient was loaded with amiodarone but unable tolerate secondary to low blood pressures.  Patient tolerating beta-blocker well with low normal pressures controlled heart rate currently.  Added Coumadin today with heparin bridge for stroke prophylaxis.  Patients super morbid obesity makes NOAC a poor choice  3.Renal insufficiency: Unknown whether acute or chronic, suspect chronic as creatinine is been maintaining around the 2.3-2.4 range.Marland Kitchen No previous records available.  Would benefit from outpatient follow-up with nephrology no acute indication for inpatient consultation, renal ultrasound negative for obstructive pathology.  4.Morbid obesity: BMI 78, As stated above. Will need sleep apnea evaluation.    Patient seen by PT with recommendations for skilled nursing facility for generalized weakness with intensive physical therapy for gait mobility balance strength and transfer training in order to achieve improvement ADLs.  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  6. Dermatitis: Suspect dermatitis. Less likely underlying soft tissue infection. Will start with nystatin powder.  Patient wound care recommendations continue with triple barrier treatment  Medical decision  making: Patient requires continued inpatient management with IV heparin, IV Lasix,  electrolyte monitoring, additional respiratory support with supplemental O2 and expert consultation with cardiology for the following: -Congestive heart failure acute systolic quiring IV diuresis -A. fib RVR requiring IV treatment   DVT prophylaxis: On:hep gtt with coumadin initated  Code Status: full    Code Status Orders  (From admission, onward)         Start     Ordered   05/11/18 2214  Full code  Continuous     05/11/18 2213        Code Status History    This patient has a current code status but no historical code status.     Family Communication: daughter at bedside  Disposition Plan:   Skilled nursing facility pending Consults called: Cardiology Admission status: Inpatient   Consultants:   Cardiology  Procedures:  Dg Chest 2 View  Result Date: 05/13/2018 CLINICAL DATA:  Shortness of breath. EXAM: CHEST - 2 VIEW COMPARISON:  05/11/2018 FINDINGS: Examination is degraded due to patient habitus. Grossly unchanged enlarged cardiac silhouette and mediastinal contours. Pulmonary vasculature appears slightly less distinct on the present examination with cephalization of flow. There is minimal pleuroparenchymal thickening about the bilateral major fissures. Minimal perihilar heterogeneous opacities favored to represent atelectasis. No new focal airspace opacities. Trace pleural effusions are not excluded. No pneumothorax. No acute osseous abnormalities. IMPRESSION: Cardiomegaly with suspected mild pulmonary edema and trace bilateral effusions on this body habitus degraded examination. Electronically Signed   By: Sandi Mariscal M.D.   On: 05/13/2018 15:23   US Renal  Result Date: 05/13/2018 CLINICAL DATA:  Renal insufficiency.  Fluid retention. EXAM: RENAL / URINARY TRACT ULTRASOUND COMPLETE COMPARISON:  None. FINDINGS: Degraded exam secondary to patient morbid obesity and immobility. Right Kidney:  Renal measurements: 11.8 x 4.5 x 6.4 cm = volume: 179 mL. Grossly normal echogenicity. No hydronephrosis. Left Kidney: Renal measurements: 10.0 x 5.9 x 5.9 cm = volume: 182.7 mL. Suboptimally visualized. No gross abnormality identified. No hydronephrosis. Bladder: Appears normal for degree of bladder distention. IMPRESSION: 1. Moderate limitations as detailed above. 2. No hydronephrosis or specific explanation for renal insufficiency. Electronically Signed   By: Abigail Miyamoto M.D.   On: 05/13/2018 15:49   Nm Pulmonary Perf And Vent  Result Date: 05/13/2018 CLINICAL DATA:  Concern for pulmonary embolism. EXAM: NUCLEAR MEDICINE VENTILATION - PERFUSION LUNG SCAN TECHNIQUE: Ventilation images were obtained in multiple projections using inhaled aerosol Tc-64m DTPA. Perfusion images were obtained in multiple projections after intravenous injection of Tc-79m MAA. RADIOPHARMACEUTICALS:  32.3 mCi of Tc-64m DTPA aerosol inhalation and 4.22 mCi Tc61m MAA IV COMPARISON:  Chest radiograph-earlier same day; 05/11/2018 FINDINGS: Review of chest radiograph performed earlier today demonstrates unchanged enlarged cardiac silhouette and mediastinal contours. Chronic pulmonary venous congestion without frank evidence of edema. There is persistent mild elevation of the left hemidiaphragm. No definite pleural effusion or pneumothorax. Ventilation: Ventilatory images demonstrates clumping of inhaled radiotracer about the bilateral pulmonary hila, right greater than left. Ingested radiotracer is seen within the oro and hypopharynx as well as the mid esophagus. Perfusion: Perfusion images demonstrate relative homogeneous distribution of injected radiotracer throughout the bilateral pulmonary parenchyma without discrete area mismatched perfusion to suggest pulmonary embolism. IMPRESSION: Pulmonary embolism absent (very low probability of pulmonary embolism). Electronically Signed   By: Sandi Mariscal M.D.   On: 05/13/2018 15:21   Dg Chest  Port 1 View  Result Date: 05/11/2018 CLINICAL DATA:  Shortness of breath. Hypertension. EXAM: PORTABLE CHEST 1 VIEW COMPARISON:  None. FINDINGS: Mildly degraded exam due  to AP portable technique and patient body habitus. Apical lordotic positioning. Midline trachea. Cardiomegaly accentuated by AP portable technique. Mild left hemidiaphragm elevation. No right and no definite left pleural effusion. Numerous leads and wires project over the chest. No pneumothorax. No congestive failure. Relatively low lung volumes. This accentuates the pulmonary interstitium. IMPRESSION: Cardiomegaly and low lung volumes, without acute disease. Electronically Signed   By: Abigail Miyamoto M.D.   On: 05/11/2018 13:47   Vas [place] Lower Extremity Venous (dvt)  Result Date: 05/12/2018  Lower Venous Study Indications: Edema.  Limitations: Body habitus. Comparison Study: No prior study on file Performing Technologist: Sharion Dove RVS  Examination Guidelines: A complete evaluation includes B-mode imaging, spectral Doppler, color Doppler, and power Doppler as needed of all accessible portions of each vessel. Bilateral testing is considered an integral part of a complete examination. Limited examinations for reoccurring indications may be performed as noted.  Right Venous Findings: +---------+---------------+---------+-----------+----------+--------------+          CompressibilityPhasicitySpontaneityPropertiesSummary        +---------+---------------+---------+-----------+----------+--------------+ CFV                                                   visualized     +---------+---------------+---------+-----------+----------+--------------+ FV Prox                                               Not visualized +---------+---------------+---------+-----------+----------+--------------+ FV Mid                                                visualized      +---------+---------------+---------+-----------+----------+--------------+ FV Distal                                             Not visualized +---------+---------------+---------+-----------+----------+--------------+ PFV                                                   Not visualized +---------+---------------+---------+-----------+----------+--------------+ POP                                                   Not visualized +---------+---------------+---------+-----------+----------+--------------+ PTV                                                   Not visualized +---------+---------------+---------+-----------+----------+--------------+  Left Venous Findings: +---------+---------------+---------+-----------+----------+--------------+          CompressibilityPhasicitySpontaneityPropertiesSummary        +---------+---------------+---------+-----------+----------+--------------+ CFV  Not visualized +---------+---------------+---------+-----------+----------+--------------+ FV Prox                                               Not visualized +---------+---------------+---------+-----------+----------+--------------+ FV Mid                                                Not visualized +---------+---------------+---------+-----------+----------+--------------+ FV Distal                                             Not visualized +---------+---------------+---------+-----------+----------+--------------+ PFV                                                   Not visualized +---------+---------------+---------+-----------+----------+--------------+ POP      Full           Yes      Yes                                 +---------+---------------+---------+-----------+----------+--------------+ PTV                                                   Not visualized  +---------+---------------+---------+-----------+----------+--------------+ PERO                                                  Not visualized +---------+---------------+---------+-----------+----------+--------------+    Summary: Right: There is no evidence of deep vein thrombosis in the lower extremity. However, portions of this examination were limited- see technologist comments above. Left: There is no evidence of deep vein thrombosis in the lower extremity. However, portions of this examination were limited- see technologist comments above.  *See table(s) above for measurements and observations. Electronically signed by Monica Martinez MD on 05/12/2018 at 4:29:14 PM.    Final    [place] Ekg Site Rite  Result Date: 05/13/2018 If Site Rite image not attached, placement could not be confirmed due to current cardiac rhythm.    Antimicrobials:   none   Subjective: No acute events overnight, patient is been somewhat reticent to work with physical therapy because of generalized weakness and pain.  Had extensive discussion with patient and family on the necessity for home physical therapy if she can go to a skilled nursing facility.  Patient reports to work with physical therapy today.  Objective: Vitals:   05/14/18 2221 05/15/18 0008 05/15/18 0442 05/15/18 0822  BP: (!) 155/120 (!) 111/59 (!) 90/55 (!) 139/112  Pulse: (!) 113 (!) 101 93 91  Resp:  (!) 22 (!) 22 16  Temp:  98.7 F (37.1 C) 98.7 F (37.1 C) (!) 97.5 F (36.4 C)  TempSrc:  Oral Axillary Oral  SpO2:  94% 94% 92%  Weight:   (!) 215.9 kg   Height:        Intake/Output Summary (Last 24 hours) at 05/15/2018 1121 Last data filed at 05/15/2018 0651 Gross per 24 hour  Intake 600 ml  Output 900 ml  Net -300 ml   Filed Weights   05/13/18 0634 05/14/18 0524 05/15/18 0442  Weight: (!) 222.3 kg (!) 204.1 kg (!) 215.9 kg    Examination:  General exam: Appears calm, super morbid obesity Respiratory system: Limited exam  secondary body habitus clear to auscultation. Respiratory effort normal. Cardiovascular system: Irregularly irregular although rate controlled, difficult to auscultate murmurs given habitus  gastrointestinal system: Abdomen is protuberant but nondistended, soft and nontender. No organomegaly or masses felt although limited exam for habitus as above. Normal bowel sounds heard. Central nervous system: Alert and oriented. No focal neurological deficits. Extremities: Symmetric 5 x 5 power. Skin: No rashes, lesions or ulcers Psychiatry: Judgement and insight appear limited. Mood & affect appropriate.     Data Reviewed: I have personally reviewed following labs and imaging studies  CBC: Recent Labs  Lab 05/11/18 1325 05/11/18 1735 05/12/18 0443 05/13/18 0508 05/14/18 0412  WBC 7.9  --  8.4 9.0 7.1  NEUTROABS 6.3  --   --  7.0 5.4  HGB 13.1 12.9 12.9 12.1 11.5*  HCT 42.2 38.0 41.8 39.4 38.4  MCV 101.4*  --  101.2* 101.8* 101.1*  PLT 110*  --  105* 102* 259*   Basic Metabolic Panel: Recent Labs  Lab 05/11/18 1325 05/11/18 1735 05/12/18 0443 05/13/18 0508 05/14/18 0412 05/15/18 0548  NA 142 141 141 139 137 135  K 4.3 4.4 4.7 4.7 4.6 4.6  CL 104  --  102 100 102 99  CO2 29  --  24 28 28 29   GLUCOSE 100*  --  89 111* 112* 103*  BUN 36*  --  40* 47* 51* 54*  CREATININE 2.21*  --  1.99* 2.35* 2.49* 2.32*  CALCIUM 9.0  --  8.8* 8.4* 8.3* 8.7*   GFR: Estimated Creatinine Clearance: 40 mL/min (A) (by C-G formula based on SCr of 2.32 mg/dL (H)). Liver Function Tests: Recent Labs  Lab 05/11/18 1325  AST 46*  ALT 30  ALKPHOS 66  BILITOT 1.2  PROT 6.6  ALBUMIN 3.4*   No results for input(s): LIPASE, AMYLASE in the last 168 hours. No results for input(s): AMMONIA in the last 168 hours. Coagulation Profile: Recent Labs  Lab 05/15/18 0548  INR 1.3*   Cardiac Enzymes: Recent Labs  Lab 05/11/18 1325 05/11/18 1943 05/11/18 2257 05/12/18 0443  TROPONINI 0.06* 0.06* 0.07*  0.07*   BNP (last 3 results) No results for input(s): PROBNP in the last 8760 hours. HbA1C: No results for input(s): HGBA1C in the last 72 hours. CBG: No results for input(s): GLUCAP in the last 168 hours. Lipid Profile: No results for input(s): CHOL, HDL, LDLCALC, TRIG, CHOLHDL, LDLDIRECT in the last 72 hours. Thyroid Function Tests: No results for input(s): TSH, T4TOTAL, FREET4, T3FREE, THYROIDAB in the last 72 hours. Anemia Panel: No results for input(s): VITAMINB12, FOLATE, FERRITIN, TIBC, IRON, RETICCTPCT in the last 72 hours. Sepsis Labs: No results for input(s): PROCALCITON, LATICACIDVEN in the last 168 hours.  No results found for this or any previous visit (from the past 240 hour(s)).       Radiology Studies: Nm Pulmonary Perf And Vent  Result Date: 05/13/2018 CLINICAL DATA:  Concern for pulmonary embolism. EXAM: NUCLEAR MEDICINE  VENTILATION - PERFUSION LUNG SCAN TECHNIQUE: Ventilation images were obtained in multiple projections using inhaled aerosol Tc-69m DTPA. Perfusion images were obtained in multiple projections after intravenous injection of Tc-69m MAA. RADIOPHARMACEUTICALS:  32.3 mCi of Tc-74m DTPA aerosol inhalation and 4.22 mCi Tc65m MAA IV COMPARISON:  Chest radiograph-earlier same day; 05/11/2018 FINDINGS: Review of chest radiograph performed earlier today demonstrates unchanged enlarged cardiac silhouette and mediastinal contours. Chronic pulmonary venous congestion without frank evidence of edema. There is persistent mild elevation of the left hemidiaphragm. No definite pleural effusion or pneumothorax. Ventilation: Ventilatory images demonstrates clumping of inhaled radiotracer about the bilateral pulmonary hila, right greater than left. Ingested radiotracer is seen within the oro and hypopharynx as well as the mid esophagus. Perfusion: Perfusion images demonstrate relative homogeneous distribution of injected radiotracer throughout the bilateral pulmonary parenchyma  without discrete area mismatched perfusion to suggest pulmonary embolism. IMPRESSION: Pulmonary embolism absent (very low probability of pulmonary embolism). Electronically Signed   By: Sandi Mariscal M.D.   On: 05/13/2018 15:21   [place] Ekg Site Rite  Result Date: 05/13/2018 If Site Rite image not attached, placement could not be confirmed due to current cardiac rhythm.       Scheduled Meds: . aspirin EC  81 mg Oral Daily  . feeding supplement (PRO-STAT SUGAR FREE 64)  30 mL Oral BID  . furosemide  40 mg Intravenous BID  . metoprolol succinate  100 mg Oral Daily  . nystatin cream   Topical BID  . sodium chloride flush  10-40 mL Intracatheter Q12H   Continuous Infusions: . heparin    . sodium chloride       LOS: 4 days    Time spent: 35 min    Nicolette Bang, MD [facility]  If 7PM-7AM, please contact night-coverage  05/15/2018, 11:21 AM

## 2018-05-15 NOTE — Progress Notes (Signed)
Patient resting comfortably on 4L [place] with no respiratory distress noted. BIPAP not needed at this time. RT will monitor as needed.

## 2018-05-15 NOTE — TOC Initial Note (Signed)
Transition of Care [facility]) - Initial/Assessment Note    Patient Details  Name: Jean Stewart MRN: [id number] Date of Birth: Nov 13, 1945  [facility]) CM/SW Contact:    Estanislado Emms, LCSW Phone Number: 05/15/2018, 3:35 PM  Clinical Narrative: Patient from home alone. PT recommending SNF. CSW met with patient at bedside. Patient at first seemed oriented and answered questions appropriately. However, when CSW began to assess ADLs and support at home, patient perseverated on the idea of transportation. Was unable to gain meaningful insight into patient's functioning at home.  She did report that her daughter lives in [place] and she does not have family for support here in [place]. She gave permission to talk to her daughter, Jean.  CSW called Jean and left voicemail requesting call back. Awaiting return call from daughter to discuss disposition planning. Noted from OT that family were intent on taking patient home, but family was not present at bedside for CSW assessment. CSW to follow and support.  Expected Discharge Plan: Skilled Nursing Facility Barriers to Discharge: Ship broker, Continued Medical Work up   Patient Goals and CMS Choice        Expected Discharge Plan and Services Expected Discharge Plan: [place]     Living arrangements for the past 2 months: Apartment                          Prior Living Arrangements/Services Living arrangements for the past 2 months: Apartment Lives with:: Self Patient language and need for interpreter reviewed:: No Do you feel safe going back to the place where you live?: Yes      Need for Family Participation in Patient Care: Yes (Comment) Care giver support system in place?: (unclear)   Criminal Activity/Legal Involvement Pertinent to Current Situation/Hospitalization: No - Comment as needed  Activities of Daily Living Home Assistive Devices/Equipment: Walker (specify  type) ADL Screening (condition at time of admission) Patient's cognitive ability adequate to safely complete daily activities?: Yes Is the patient deaf or have difficulty hearing?: No Does the patient have difficulty seeing, even when wearing glasses/contacts?: No Does the patient have difficulty concentrating, remembering, or making decisions?: No Patient able to express need for assistance with ADLs?: Yes Does the patient have difficulty dressing or bathing?: Yes Independently performs ADLs?: No Toileting: Dependent Is this a change from baseline?: Pre-admission baseline In/Out Bed: Dependent Is this a change from baseline?: Pre-admission baseline Walks in Home: Dependent Is this a change from baseline?: Pre-admission baseline Does the patient have difficulty walking or climbing stairs?: Yes Weakness of Legs: Both Weakness of Arms/Hands: None  Permission Sought/Granted Permission sought to share information with : Family Supports Permission granted to share information with : Yes, Verbal Permission Granted  Share Information with NAME: Jean Stewart     Permission granted to share info w Relationship: daughter  Permission granted to share info w Contact Information: [phone]  Emotional Assessment Appearance:: Appears stated age Attitude/Demeanor/Rapport: Engaged Affect (typically observed): Accepting, Calm Orientation: : Oriented to Self, Oriented to Place Alcohol / Substance Use: Not Applicable Psych Involvement: No (comment)  Admission diagnosis:  Morbid obesity ([place]) [E66.01] Peripheral edema [R60.9] Elevated serum creatinine [R79.89] Atrial fibrillation with rapid ventricular response (HCC) [I48.91] Acute congestive heart failure, unspecified heart failure type ([place]) [I50.9] New onset a-fib [facility]) [I48.91] Patient Active Problem List   Diagnosis Date Noted  . Pressure injury of skin 05/12/2018  . Atrial fibrillation with RVR ([place]) 05/11/2018  .  Obesity, Class  III, BMI 40-49.9 (morbid obesity) ([place]) 05/11/2018  . Essential hypertension 05/11/2018  . Renal insufficiency 05/11/2018  . New onset a-fib ([place]) 05/11/2018  . Acute respiratory failure with hypoxia and hypercapnia ([place]) 05/11/2018  . Elevated troponin 05/11/2018   PCP:  Patient, No Pcp Per Pharmacy:   [facility] [address] ([address]), [place] [address] [place] [address] Phone: [phone] Fax: [phone]     Social Determinants of Health (SDOH) Interventions    Readmission Risk Interventions 30 Day Unplanned Readmission Risk Score     ED to Hosp-Admission (Current) from 05/11/2018 in [facility]  30 Day Unplanned Readmission Risk Score (%)  13 Filed at 05/15/2018 1200     This score is the patient's risk of an unplanned readmission within 30 days of being discharged (0 -100%). The score is based on dignosis, age, lab data, medications, orders, and past utilization.   Low:  0-14.9   Medium: 15-21.9   High: 22-29.9   Extreme: 30 and above       No flowsheet data found.

## 2018-05-15 NOTE — Progress Notes (Signed)
Progress Note  Patient Name: Jean Stewart Date of Encounter: 05/15/2018  Primary Cardiologist: Ena Dawley, MD   Subjective   No complaints. Denies SOB. No chest pain.  Inpatient Medications    Scheduled Meds: . aspirin EC  81 mg Oral Daily  . feeding supplement (PRO-STAT SUGAR FREE 64)  30 mL Oral BID  . furosemide  40 mg Intravenous BID  . metoprolol succinate  100 mg Oral Daily  . nystatin cream   Topical BID  . sodium chloride flush  10-40 mL Intracatheter Q12H   Continuous Infusions: . heparin    . sodium chloride     PRN Meds: acetaminophen, albuterol, docusate sodium, HYDROcodone-acetaminophen, sodium chloride flush, Zinc Oxide   Vital Signs    Vitals:   05/14/18 2221 05/15/18 0008 05/15/18 0442 05/15/18 0822  BP: (!) 155/120 (!) 111/59 (!) 90/55 (!) 139/112  Pulse: (!) 113 (!) 101 93 91  Resp:  (!) 22 (!) 22 16  Temp:  98.7 F (37.1 C) 98.7 F (37.1 C) (!) 97.5 F (36.4 C)  TempSrc:  Oral Axillary Oral  SpO2:  94% 94% 92%  Weight:   (!) 215.9 kg   Height:        Intake/Output Summary (Last 24 hours) at 05/15/2018 0920 Last data filed at 05/15/2018 0651 Gross per 24 hour  Intake 840 ml  Output 900 ml  Net -60 ml   Last 3 Weights 05/15/2018 05/14/2018 05/13/2018  Weight (lbs) 476 lb 450 lb 490 lb  Weight (kg) 215.912 kg 204.119 kg 222.263 kg      Telemetry    Atrial fibrillation rate 80-90s- Personally Reviewed  ECG    None today- Personally Reviewed  Physical Exam   GEN: super morbidly obese AAF in No acute distress.   Neck: No JVD Cardiac: irregularly irregular rhythm w tachy rate, no murmurs, rubs, or gallops.  Respiratory: Clear to auscultation bilaterally. GI: obese abdomen, soft, nontender, non-distended  MS: obese extremities. No pitting edema. Neuro:  Nonfocal  Psych: Normal affect   Labs    Chemistry Recent Labs  Lab 05/11/18 1325  05/13/18 0508 05/14/18 0412 05/15/18 0548  NA 142   < > 139 137 135  K 4.3   < >  4.7 4.6 4.6  CL 104   < > 100 102 99  CO2 29   < > 28 28 29   GLUCOSE 100*   < > 111* 112* 103*  BUN 36*   < > 47* 51* 54*  CREATININE 2.21*   < > 2.35* 2.49* 2.32*  CALCIUM 9.0   < > 8.4* 8.3* 8.7*  PROT 6.6  --   --   --   --   ALBUMIN 3.4*  --   --   --   --   AST 46*  --   --   --   --   ALT 30  --   --   --   --   ALKPHOS 66  --   --   --   --   BILITOT 1.2  --   --   --   --   GFRNONAA 21*   < > 20* 18* 20*  GFRAA 25*   < > 23* 21* 23*  ANIONGAP 9   < > 11 7 7    < > = values in this interval not displayed.     Hematology Recent Labs  Lab 05/12/18 0443 05/13/18 0508 05/14/18 0412  WBC 8.4 9.0 7.1  RBC 4.13 3.87 3.80*  HGB 12.9 12.1 11.5*  HCT 41.8 39.4 38.4  MCV 101.2* 101.8* 101.1*  MCH 31.2 31.3 30.3  MCHC 30.9 30.7 29.9*  RDW 15.1 15.0 14.7  PLT 105* 102* 108*    Cardiac Enzymes Recent Labs  Lab 05/11/18 1325 05/11/18 1943 05/11/18 2257 05/12/18 0443  TROPONINI 0.06* 0.06* 0.07* 0.07*   No results for input(s): TROPIPOC in the last 168 hours.   BNP Recent Labs  Lab 05/11/18 1325  BNP 309.8*     DDimer No results for input(s): DDIMER in the last 168 hours.   Radiology    Dg Chest 2 View  Result Date: 05/13/2018 CLINICAL DATA:  Shortness of breath. EXAM: CHEST - 2 VIEW COMPARISON:  05/11/2018 FINDINGS: Examination is degraded due to patient habitus. Grossly unchanged enlarged cardiac silhouette and mediastinal contours. Pulmonary vasculature appears slightly less distinct on the present examination with cephalization of flow. There is minimal pleuroparenchymal thickening about the bilateral major fissures. Minimal perihilar heterogeneous opacities favored to represent atelectasis. No new focal airspace opacities. Trace pleural effusions are not excluded. No pneumothorax. No acute osseous abnormalities. IMPRESSION: Cardiomegaly with suspected mild pulmonary edema and trace bilateral effusions on this body habitus degraded examination. Electronically  Signed   By: Sandi Mariscal M.D.   On: 05/13/2018 15:23   US Renal  Result Date: 05/13/2018 CLINICAL DATA:  Renal insufficiency.  Fluid retention. EXAM: RENAL / URINARY TRACT ULTRASOUND COMPLETE COMPARISON:  None. FINDINGS: Degraded exam secondary to patient morbid obesity and immobility. Right Kidney: Renal measurements: 11.8 x 4.5 x 6.4 cm = volume: 179 mL. Grossly normal echogenicity. No hydronephrosis. Left Kidney: Renal measurements: 10.0 x 5.9 x 5.9 cm = volume: 182.7 mL. Suboptimally visualized. No gross abnormality identified. No hydronephrosis. Bladder: Appears normal for degree of bladder distention. IMPRESSION: 1. Moderate limitations as detailed above. 2. No hydronephrosis or specific explanation for renal insufficiency. Electronically Signed   By: Abigail Miyamoto M.D.   On: 05/13/2018 15:49   Nm Pulmonary Perf And Vent  Result Date: 05/13/2018 CLINICAL DATA:  Concern for pulmonary embolism. EXAM: NUCLEAR MEDICINE VENTILATION - PERFUSION LUNG SCAN TECHNIQUE: Ventilation images were obtained in multiple projections using inhaled aerosol Tc-63m DTPA. Perfusion images were obtained in multiple projections after intravenous injection of Tc-28m MAA. RADIOPHARMACEUTICALS:  32.3 mCi of Tc-29m DTPA aerosol inhalation and 4.22 mCi Tc50m MAA IV COMPARISON:  Chest radiograph-earlier same day; 05/11/2018 FINDINGS: Review of chest radiograph performed earlier today demonstrates unchanged enlarged cardiac silhouette and mediastinal contours. Chronic pulmonary venous congestion without frank evidence of edema. There is persistent mild elevation of the left hemidiaphragm. No definite pleural effusion or pneumothorax. Ventilation: Ventilatory images demonstrates clumping of inhaled radiotracer about the bilateral pulmonary hila, right greater than left. Ingested radiotracer is seen within the oro and hypopharynx as well as the mid esophagus. Perfusion: Perfusion images demonstrate relative homogeneous distribution of  injected radiotracer throughout the bilateral pulmonary parenchyma without discrete area mismatched perfusion to suggest pulmonary embolism. IMPRESSION: Pulmonary embolism absent (very low probability of pulmonary embolism). Electronically Signed   By: Sandi Mariscal M.D.   On: 05/13/2018 15:21   [place] Ekg Site Rite  Result Date: 05/13/2018 If Site Rite image not attached, placement could not be confirmed due to current cardiac rhythm.   Cardiac Studies   2D Echo 05/12/18 IMPRESSIONS    1. The left ventricle has mild-moderately reduced systolic function, with an ejection fraction of 40-45%. The cavity size was normal. There is moderate concentric left  ventricular hypertrophy. Left ventricular diastology could not be evaluated Elevated  left ventricular end-diastolic pressure.  2. The right ventricle has moderately reduced systolic function. The cavity was normal. There is no increase in right ventricular wall thickness.  3. Left atrial size was moderately dilated.  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 The MR jet is posteriorly-directed.  5. The tricuspid valve is normal in structure. Tricuspid valve regurgitation is moderate-severe.  6. The aortic valve is tricuspid Mild thickening of the aortic valve Mild calcification of the aortic valve.  7. There is evidence of plaque in the ascending aorta.  8. Pulmonary hypertension is moderate.  9. The inferior vena cava was dilated in size with <50% respiratory variability. 10. Mild hypokinesis of the left ventricular inferior wall.  Patient Profile   Jean Stewart is a 73 y.o. female with a hx of severe morbid obesity (BMI 84), ? blood clot 5 years ago, deconditioning, suspected OSA (per daughter) and HTN who is being seen today for the evaluation of atrial fibrillation with RVR and abnormal echo/CHF at the request of Dr. Wyonia Hough. She is admitted with inability to stand up due to  lack of strength, worsening leg swelling, DOE, AF RVR, hypoxia on RA, renal insufficiency with Cr of 2.21 with unknown baseline, and soft BP.  Assessment & Plan   1. Acute combined systolic diastolic CHF: LVEF reduced by echo 40-45% with diffuse hypokinesis. Chronicity unknown. No prior echos for comparison. Diuretics were increased  due to volume overload, 40 IV Lasix BID. Unfortunately, strict I/Os were not being recorded to assess urinary response but response appeared to be decent yesterday. Really difficult  to assess volume due to body habitus/ super morbid obesity but I am not overly impressed that she has a lot of edema- mostly obese. Renal function is improved today. Weight is not reliable. I think it would be reasonable to transition to po lasix. If BP allows could consider addition of nitrates/hydralazine. Not a candidate for ACEi,ARB,Aldactone due to CKD.  2. Atrial fibrillation with RVR: new diagnosis. Was initially placed on IV Cardizem but discontinued due to hypotension. Then placed on IV amiodarone>> converted to po yesterday. Remains in afib w/ HR well controlled. With no plans to attempt to restore NSR I would just rate control with metoprolol only. Will switch to Toprol XL 100 mg daily. Initially unable to get addmtional IV access for IV heparin, and pt was being treated w/ enoxaparin. Now has PICC line. Pharmacy following. Can transition to heparin. Pharmacy to dose.  Will need long term oral anticoagulation for Afib. Given super morbid obesity she is a poor candidate for NOACs. Would start coumadin when ok with primary team.  3. Acute respiratory failure with hypoxia and hypercapnia: VQ scan yesterday very low probability for PE. Multifactorial, acute on chronic combined systolic and diastolic CHF, obesity hypoventilation syndrome, pulmonary HTN, Afib w/ RVR, mitral and tricuspid valve regurgitation. I suspect hypoventilation is the primary issue  4. Obesity hypoventilation syndrome:  highly likely obstructive sleep apnea.   5. Mildly Elevated Troponin>>Demand ischemia: trops 0.06>>0.07. In the settings of CHF renal failure  6. Renal failure: unknown chronicity, baseline known. Scr has been trending up this admit 1.99>>2.35>>2.49. improved today to 2.32. would avoid overly aggressive diuresis and hypotension.   7. 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 Tricuspid Regurgitation: moderate to severe by echo.   9. Pulmonary  HTN: moderate by echo. Likely 2/2 obesity hyperventilation syndrome and possible OSA.   10. Super Morbid Obesity: Body mass index is 81.71 kg/m.   For questions or updates, please contact [place] Please consult [email or web address] for contact info under        Signed, Rhetta Cleek [place], MD  05/15/2018, 9:20 AM

## 2018-05-15 NOTE — Discharge Instructions (Addendum)
Cardiac Ablation  Cardiac ablation is a procedure to stop some heart tissue from causing problems. The heart has many electrical connections. Sometimes these connections make the heart beat very fast or irregularly. Removing some problem areas can improve the heart rhythm or make it normal. What happens before the procedure?  Follow instructions from your doctor about what you cannot eat or drink.  Ask your doctor about: ? Changing or stopping your normal medicines. This is important if you take diabetes medicines or blood thinners. ? Taking medicines such as aspirin and ibuprofen. These medicines can thin your blood. Do not take these medicines before your procedure if your doctor tells you not to.  Plan to have someone take you home.  If you will be going home right after the procedure, plan to have someone with you for 24 hours. What happens during the procedure?  To lower your risk of infection: ? Your health care team will wash or sanitize their hands. ? Your skin will be washed with soap. ? Hair may be removed from your neck or groin.  An IV tube will be put into one of your veins.  You will be given a medicine to help you relax (sedative).  Skin on your neck or groin will be numbed.  A cut (incision) will be made in your neck or groin.  A needle will be put through your cut and into a vein in your neck or groin.  A tube (catheter) will be put into the needle. The tube will be moved to your heart. X-rays (fluoroscopy) will be used to help guide the tube.  Small devices (electrodes) on the tip of the tube will send out electrical currents.  Dye may be put through the tube. This helps your surgeon see your heart.  Electrical energy will be used to scar (ablate) some heart tissue. Your surgeon may use: ? Heat (radiofrequency energy). ? Laser energy. ? Extreme cold (cryoablation).  The tube will be taken out.  Pressure will be held on your cut. This helps stop  bleeding.  A bandage (dressing) will be put on your cut. The procedure may vary. What happens after the procedure?  You will be monitored until your medicines have worn off.  Your cut will be watched for bleeding. You will need to lie still for a few hours.  Do not drive for 24 hours or as long as your doctor tells you. Summary  Cardiac ablation is a procedure to stop some heart tissue from causing problems.  Electrical energy will be used to scar (ablate) some heart tissue. This information is not intended to replace advice given to you by your health care provider. Make sure you discuss any questions you have with your health care provider. Document Released: 10/24/2012 Document Revised: 01/11/2016 Document Reviewed: 01/11/2016 Elsevier Interactive Patient Education  2019 [place].   Heart Failure Eating Plan Heart failure, also called congestive heart failure, occurs when your heart does not pump blood well enough to meet your body's needs for oxygen-rich blood. Heart failure is a long-term (chronic) condition. Living with heart failure can be challenging. However, following your health care provider's instructions about a healthy lifestyle and working with a diet and nutrition specialist (dietitian) to choose the right foods may help to improve your symptoms. What are tips for following this plan? General guidelines  Do not eat more than 2,300 mg of salt (sodium) a day. The amount of sodium that is recommended for you may be  lower, depending on your condition.  Maintain a healthy body weight as directed. Ask your health care provider what a healthy weight is for you. ? Check your weight every day. ? Work with your health care provider and dietitian to make a plan that is right for you to lose weight or maintain your current weight.  Limit how much fluid you drink. Ask your health care provider or dietitian how much fluid you can have each day.  Limit or avoid alcohol as  told by your health care provider or dietitian. Reading food labels  Check food labels for the amount of sodium per serving. Choose foods that have less than 140 mg (milligrams) of sodium in each serving.  Check food labels for the number of calories per serving. This is important if you need to limit your daily calorie intake to lose weight.  Check food labels for the serving size. If you eat more than one serving, you will be eating more sodium and calories than what is listed on the label.  Look for foods that are labeled as "sodium-free," "very low sodium," or "low sodium." ? Foods labeled as "reduced sodium" or "lightly salted" may still have more sodium than what is recommended for you. Cooking  Avoid adding salt when cooking. Ask your health care provider or dietitian before using salt substitutes.  Season food with salt-free seasonings, spices, or herbs. Check the label of seasoning mixes to make sure they do not contain salt.  Cook with heart-healthy oils, such as olive, canola, soybean, or sunflower oil.  Do not fry foods. Cook foods using low-fat methods, such as baking, boiling, grilling, and broiling.  Limit unhealthy fats when cooking by: ? Removing the skin from poultry, such as chicken. ? Removing all visible fats from meats. ? Skimming the fat off from stews, soups, and gravies before serving them. Meal planning   Limit your intake of: ? Processed, canned, or pre-packaged foods. ? Foods that are high in trans fat, such as fried foods. ? Sweets, desserts, sugary drinks, and other foods with added sugar. ? Full-fat dairy products, such as whole milk.  Eat a balanced diet that includes: ? 4-5 servings of fruit each day and 4-5 servings of vegetables each day. At each meal, try to fill half of your plate with fruits and vegetables. ? Up to 6-8 servings of whole grains each day. ? Up to 2 servings of lean meat, poultry, or fish each day. One serving of meat is equal  to 3 oz. This is about the same size as a deck of cards. ? 2 servings of low-fat dairy each day. ? Heart-healthy fats. Healthy fats called omega-3 fatty acids are found in foods such as flaxseed and cold-water fish like sardines, salmon, and mackerel.  Aim to eat 25-35 g (grams) of fiber a day. Foods that are high in fiber include apples, broccoli, carrots, beans, peas, and whole grains.  Do not add salt or condiments that contain salt (such as soy sauce) to foods before eating.  When eating at a restaurant, ask that your food be prepared with less salt or no salt, if possible.  Try to eat 2 or more vegetarian meals each week.  Eat more home-cooked food and eat less restaurant, buffet, and fast food. Recommended foods The items listed may not be a complete list. Talk with your dietitian about what dietary choices are best for you. Grains Bread with less than 80 mg of sodium per slice. Whole-wheat pasta,  quinoa, and brown rice. Oats and oatmeal. Barley. [place]. Grits and cream of wheat. Whole-grain and whole-wheat cold cereal. Vegetables All fresh vegetables. Vegetables that are frozen without sauce or added salt. Low-sodium or sodium-free canned vegetables. Fruits All fresh, frozen, and canned fruits. Dried fruits, such as raisins, prunes, and cranberries. Meats and other protein foods Lean cuts of meat. Skinless chicken and [place]. Fish with high omega-3 fatty acids, such as salmon, sardines, and other cold-water fishes. Eggs. Dried beans, peas, and edamame. Unsalted nuts and nut butters. Dairy Low-fat or nonfat (skim) milk and dried milk. Rice milk, soy milk, and almond milk. Low-fat or nonfat yogurt. Small amounts of reduced-sodium block cheese. Low-sodium cottage cheese. Fats and oils Olive, canola, soybean, flaxseed, or sunflower oil. Avocado. Sweets and desserts Apple sauce. Granola bars. Sugar-free pudding and gelatin. Frozen fruit bars. Seasoning and other foods Fresh and dried  herbs. Lemon or lime juice. Vinegar. Low-sodium ketchup. Salt-free marinades, salad dressings, sauces, and seasonings. Foods to avoid The items listed may not be a complete list. Talk with your dietitian about what dietary choices are best for you. Grains Bread with more than 80 mg of sodium per slice. Hot or cold cereal with more than 140 mg sodium per serving. Salted pretzels and crackers. Pre-packaged breadcrumbs. Bagels, croissants, and biscuits. Vegetables Canned vegetables. Frozen vegetables with sauce or seasonings. Creamed vegetables. [place] fries. Onion rings. Pickled vegetables and sauerkraut. Fruits Fruits that are dried with sodium-containing preservatives. Meats and other protein foods Ribs and chicken wings. Bacon, ham, pepperoni, bologna, salami, and packaged luncheon meats. Hot dogs, bratwurst, and sausage. Canned meat. Smoked meat and fish. Salted nuts and seeds. Dairy Whole milk, half-and-half, and cream. Buttermilk. Processed cheese, cheese spreads, and cheese curds. Regular cottage cheese. Feta cheese. Shredded cheese. String cheese. Fats and oils Butter, lard, shortening, ghee, and bacon fat. Canned and packaged gravies. Seasoning and other foods Onion salt, garlic salt, table salt, and sea salt. Marinades. Regular salad dressings. Relishes, pickles, and olives. Meat flavorings and tenderizers, and bouillon cubes. Horseradish, ketchup, and mustard. Worcestershire sauce. Teriyaki sauce, soy sauce (including reduced sodium). Hot sauce and Tabasco sauce. Steak sauce, fish sauce, oyster sauce, and cocktail sauce. Taco seasonings. Barbecue sauce. Tartar sauce. Summary  A heart failure eating plan includes changes that limit your intake of sodium and unhealthy fat, and it may help you lose weight or maintain a healthy weight. Your health care provider may also recommend limiting how much fluid you drink.  Most people with heart failure should eat no more than 2,300 mg of salt  (sodium) a day. The amount of sodium that is recommended for you may be lower, depending on your condition.  Contact your health care provider or dietitian before making any major changes to your diet. This information is not intended to replace advice given to you by your health care provider. Make sure you discuss any questions you have with your health care provider. Document Released: 07/08/2016 Document Revised: 07/08/2016 Document Reviewed: 07/08/2016 Elsevier Interactive Patient Education  2019 [place] With Heart Failure  Heart failure is a long-term (chronic) condition in which the heart cannot pump enough blood through the body. When this happens, parts of the body do not get the blood and oxygen they need. There is no cure for heart failure at this time, so it is important for you to take good care of yourself and follow the treatment plan set by your health care provider. If you are living  with heart failure, there are ways to help you manage the disease. Follow these instructions at home: Living with heart failure requires you to make changes in your life. Your health care team will teach you about the changes you need to make in order to relieve your symptoms and lower your risk of going to the hospital. Follow the treatment plan as set by your health care provider. Medicines Medicines are important in reducing your heart's workload, slowing the progression of heart failure, and improving your symptoms.  Take over-the-counter and prescription medicines only as told by your health care provider.  Do not stop taking your medicine unless your health care provider tells you to do that.  Do not skip any dose of your medicine.  Refill prescriptions before you run out of medicine. You need your medicines every day. Eating and drinking   Eat heart-healthy foods. Talk with a dietitian to make an eating plan that is right for you. ? If directed by your health care  provider: ? Limit salt (sodium). Lowering your sodium intake may reduce symptoms of heart failure. Ask a dietitian to recommend heart-healthy seasonings. ? Limit your fluid intake. Fluid restriction may reduce symptoms of heart failure. ? Use low-fat cooking methods instead of frying. Low-fat methods include roasting, grilling, broiling, baking, poaching, steaming, and stir-frying. ? Choose foods that contain no trans fat and are low in saturated fat and cholesterol. Healthy choices include fresh or frozen fruits and vegetables, fish, lean meats, legumes, fat-free or low-fat dairy products, and whole-grain or high-fiber foods.  Limit alcohol intake to no more than 1 drink a day for nonpregnant women and 2 drinks a day for men. One drink equals 12 oz of beer, 5 oz of wine, or 1 oz of hard liquor. ? Drinking more than that is harmful to your heart. Tell your health care provider if you drink alcohol several times a week. ? Talk with your health care provider about whether any level of alcohol use is safe for you. Activity   Ask your health care provider about attending cardiac rehabilitation. These programs include aerobic physical activity, which provides many benefits for your heart.  If no cardiac rehabilitation program is available, ask your health care provider what aerobic exercises are safe for you to do. Lifestyle Make the lifestyle changes recommended by your health care provider. In general:  Lose weight if your health care provider tells you to do that. Weight loss may reduce symptoms of heart failure.  Do not use any products that contain nicotine or tobacco, such as cigarettes or e-cigarettes. If you need help quitting, ask your health care provider.  Do not use street (illegal) drugs.  Return to your normal activities as told by your health care provider. Ask your health care provider what activities are safe for you. General instructions   Make sure you weigh yourself every  day to track your weight. Rapid weight gain may indicate an increase in fluid in your body and may increase the workload of your heart. ? Weigh yourself every morning. Do this after you urinate but before you eat breakfast. ? Wear the same type of clothing, without shoes, each time you weigh yourself. ? Weigh yourself on the same scale and in the same spot each time.  Living with chronic heart failure often leads to emotions such as fear, stress, anxiety, and depression. If you feel any of these emotions and need help coping, contact your health care provider. Other ways to get  help include: ? Talking to friends and family members about your condition. They can give you support and guidance. Explain your symptoms to them and, if comfortable, invite them to attend appointments or rehabilitation with you. ? Joining a support group for people with chronic heart failure. Talking with other people who have the same symptoms may give you new ways of coping with your disease and your emotions.  Stay up to date with your shots (vaccines). Staying current on pneumococcal and influenza vaccines is especially important in preventing germs from attacking your airways (respiratory infections).  Keep all follow-up visits as told by your health care provider. This is important. How to recognize changes in your condition You and your family members need to know what changes to watch for in your condition. Watch for the following changes and report them to your health care provider:  Sudden weight gain. Ask your health care provider what amount of weight gain to report.  Shortness of breath: ? Feeling short of breath while at rest, with no exercise or activity that required great effort. ? Feeling breathless with activity.  Swelling of your lower legs or ankles.  Difficulty sleeping: ? You wake up feeling short of breath. ? You have to use more pillows to raise your head in order to sleep.  Frequent, dry,  hacking cough.  Loss of appetite.  Feeling more tired all the time.  Depression or feelings of sadness or hopelessness.  Bloating in the stomach. Where to find more information  Local support groups. Ask your health care provider about groups near you.  The American Heart Association: www.heart.org Contact a health care provider if:  You have a rapid weight gain.  You have increasing shortness of breath that is unusual for you.  You are unable to participate in your usual physical activities.  You tire easily.  You cough more than normal, especially with physical activity.  You have any swelling or more swelling in areas such as your hands, feet, ankles, or abdomen.  You feel like your heart is beating quickly (palpitations).  You become dizzy or light-headed when you stand up. Get help right away if:  You have difficulty breathing.  You notice or your family notices a change in your awareness, such as having trouble staying awake or having difficulty with concentration.  You have pain or discomfort in your chest.  You have an episode of fainting (syncope). Summary  There is no cure for heart failure, so it is important for you to take good care of yourself and follow the treatment plan set by your health care provider.  Medicines are important in reducing your heart's workload, slowing the progression of heart failure, and improving your symptoms.  Living with chronic heart failure often leads to emotions such as fear, stress, anxiety, and depression. If you are feeling any of these emotions and need help coping, contact your health care provider. This information is not intended to replace advice given to you by your health care provider. Make sure you discuss any questions you have with your health care provider. Document Released: 07/06/2016 Document Revised: 07/06/2016 Document Reviewed: 07/06/2016 Elsevier Interactive Patient Education  2019 [employer]   Heart Failure Heart failure is a condition in which the heart has trouble pumping blood because it has become weak or stiff. This means that the heart does not pump blood efficiently for the body to work well. For some people with heart failure, fluid may back up into the  lungs and there may be swelling (edema) in the lower legs. Heart failure is usually a long-term (chronic) condition. It is important for you to take good care of yourself and follow the treatment plan from your health care provider. What are the causes? This condition is caused by some health problems, including:  High blood pressure (hypertension). Hypertension causes the heart muscle to work harder than normal. High blood pressure eventually causes the heart to become stiff and weak.  Coronary artery disease (CAD). CAD is the buildup of cholesterol and fat (plaques) in the arteries of the heart.  Heart attack (myocardial infarction). Injured tissue, which is caused by the heart attack, does not contract as well and the heart's ability to pump blood is weakened.  Abnormal heart valves. When the heart valves do not open and close properly, the heart muscle must pump harder to keep the blood flowing.  Heart muscle disease (cardiomyopathy or myocarditis). Heart muscle disease is damage to the heart muscle from a variety of causes, such as drug or alcohol abuse, infections, or unknown causes. These can increase the risk of heart failure.  Lung disease. When the lungs do not work properly, the heart must work harder. What increases the risk? Risk of heart failure increases as a person ages. This condition is also more likely to develop in people who:  Are overweight.  Are female.  Smoke or chew tobacco.  Abuse alcohol or illegal drugs.  Have taken medicines that can damage the heart, such as chemotherapy drugs.  Have diabetes. ? High blood sugar (glucose) is associated with high fat (lipid) levels in the  blood. ? Diabetes can also damage tiny blood vessels that carry nutrients to the heart muscle.  Have abnormal heart rhythms.  Have thyroid problems.  Have low blood counts (anemia). What are the signs or symptoms? Symptoms of this condition include:  Shortness of breath with activity, such as when climbing stairs.  Persistent cough.  Swelling of the feet, ankles, legs, or abdomen.  Unexplained weight gain.  Difficulty breathing when lying flat (orthopnea).  Waking from sleep because of the need to sit up and get more air.  Rapid heartbeat.  Fatigue and loss of energy.  Feeling light-headed, dizzy, or close to fainting.  Loss of appetite.  Nausea.  Increased urination during the night (nocturia).  Confusion. How is this diagnosed? This condition is diagnosed based on:  Medical history, symptoms, and a physical exam.  Diagnostic tests, which may include: ? Echocardiogram. ? Electrocardiogram (ECG). ? Chest X-ray. ? Blood tests. ? Exercise stress test. ? Radionuclide scans. ? Cardiac catheterization and angiogram. How is this treated? Treatment for this condition is aimed at managing the symptoms of heart failure. Medicines, behavioral changes, or other treatments may be necessary to treat heart failure. Medicines These may include:  Angiotensin-converting enzyme (ACE) inhibitors. This type of medicine blocks the effects of a blood protein called angiotensin-converting enzyme. ACE inhibitors relax (dilate) the blood vessels and help to lower blood pressure.  Angiotensin receptor blockers (ARBs). This type of medicine blocks the actions of a blood protein called angiotensin. ARBs dilate the blood vessels and help to lower blood pressure.  Water pills (diuretics). Diuretics cause the kidneys to remove salt and water from the blood. The extra fluid is removed through urination, leaving a lower volume of blood that the heart has to pump.  Beta blockers. These  improve heart muscle strength and they prevent the heart from beating too quickly.  Digoxin. This  increases the force of the heartbeat. Healthy behavior changes These may include:  Reaching and maintaining a healthy weight.  Stopping smoking or chewing tobacco.  Eating heart-healthy foods.  Limiting or avoiding alcohol.  Stopping use of street drugs (illegal drugs).  Physical activity. Other treatments These may include:  Surgery to open blocked coronary arteries or repair damaged heart valves.  Placement of a biventricular pacemaker to improve heart muscle function (cardiac resynchronization therapy). This device paces both the right ventricle and left ventricle.  Placement of a device to treat serious abnormal heart rhythms (implantable cardioverter defibrillator, or ICD).  Placement of a device to improve the pumping ability of the heart (left ventricular assist device, or LVAD).  Heart transplant. This can cure heart failure, and it is considered for certain patients who do not improve with other therapies. Follow these instructions at home: Medicines  Take over-the-counter and prescription medicines only as told by your health care provider. Medicines are important in reducing the workload of your heart, slowing the progression of heart failure, and improving your symptoms. ? Do not stop taking your medicine unless your health care provider told you to do that. ? Do not skip any dose of medicine. ? Refill your prescriptions before you run out of medicine. You need your medicines every day. Eating and drinking   Eat heart-healthy foods. Talk with a dietitian to make an eating plan that is right for you. ? Choose foods that contain no trans fat and are low in saturated fat and cholesterol. Healthy choices include fresh or frozen fruits and vegetables, fish, lean meats, legumes, fat-free or low-fat dairy products, and whole-grain or high-fiber foods. ? Limit salt (sodium)  if directed by your health care provider. Sodium restriction may reduce symptoms of heart failure. Ask a dietitian to recommend heart-healthy seasonings. ? Use healthy cooking methods instead of frying. Healthy methods include roasting, grilling, broiling, baking, poaching, steaming, and stir-frying.  Limit your fluid intake if directed by your health care provider. Fluid restriction may reduce symptoms of heart failure. Lifestyle   Stop smoking or using chewing tobacco. Nicotine and tobacco can damage your heart and your blood vessels. Do not use nicotine gum or patches before talking to your health care provider.  Limit alcohol intake to no more than 1 drink per day for non-pregnant women and 2 drinks per day for men. One drink equals 12 oz of beer, 5 oz of wine, or 1 oz of hard liquor. ? Drinking more than that is harmful to your heart. Tell your health care provider if you drink alcohol several times a week. ? Talk with your health care provider about whether any level of alcohol use is safe for you. ? If your heart has already been damaged by alcohol or you have severe heart failure, drinking alcohol should be stopped completely.  Stop use of illegal drugs.  Lose weight if directed by your health care provider. Weight loss may reduce symptoms of heart failure.  Do moderate physical activity if directed by your health care provider. People who are elderly and people with severe heart failure should consult with a health care provider for physical activity recommendations. Monitor important information   Weigh yourself every day. Keeping track of your weight daily helps you to notice excess fluid sooner. ? Weigh yourself every morning after you urinate and before you eat breakfast. ? Wear the same amount of clothing each time you weigh yourself. ? Record your daily weight. Provide your health care  provider with your weight record.  Monitor and record your blood pressure as told by your  health care provider.  Check your pulse as told by your health care provider. Dealing with extreme temperatures  If the weather is extremely hot: ? Avoid vigorous physical activity. ? Use air conditioning or fans or seek a cooler location. ? Avoid caffeine and alcohol. ? Wear loose-fitting, lightweight, and light-colored clothing.  If the weather is extremely cold: ? Avoid vigorous physical activity. ? Layer your clothes. ? Wear mittens or gloves, a hat, and a scarf when you go outside. ? Avoid alcohol. General instructions  Manage other health conditions such as hypertension, diabetes, thyroid disease, or abnormal heart rhythms as told by your health care provider.  Learn to manage stress. If you need help to do this, ask your health care provider.  Plan rest periods when fatigued.  Get ongoing education and support as needed.  Participate in or seek rehabilitation as needed to maintain or improve independence and quality of life.  Stay up to date with immunizations. Keeping current on pneumococcal and influenza immunizations is especially important to prevent respiratory infections.  Keep all follow-up visits as told by your health care provider. This is important. Contact a health care provider if:  You have a rapid weight gain.  You have increasing shortness of breath that is unusual for you.  You are unable to participate in your usual physical activities.  You tire easily.  You cough more than normal, especially with physical activity.  You have any swelling or more swelling in areas such as your hands, feet, ankles, or abdomen.  You are unable to sleep because it is hard to breathe.  You feel like your heart is beating quickly (palpitations).  You become dizzy or light-headed when you stand up. Get help right away if:  You have difficulty breathing.  You notice or your family notices a change in your awareness, such as having trouble staying awake or  having difficulty with concentration.  You have pain or discomfort in your chest.  You have an episode of fainting (syncope). This information is not intended to replace advice given to you by your health care provider. Make sure you discuss any questions you have with your health care provider. Document Released: 02/21/2005 Document Revised: 01/20/2017 Document Reviewed: 09/16/2015 Elsevier Interactive Patient Education  2019 Power on my medicine - Coumadin   (Warfarin)  Why was Coumadin prescribed for you? Coumadin was prescribed for you because you have a blood clot or a medical condition that can cause an increased risk of forming blood clots. Blood clots can cause serious health problems by blocking the flow of blood to the heart, lung, or brain. Coumadin can prevent harmful blood clots from forming. As a reminder your indication for Coumadin is:   Stroke Prevention Because Of Atrial Fibrillation  What test will check on my response to Coumadin? While on Coumadin (warfarin) you will need to have an INR test regularly to ensure that your dose is keeping you in the desired range. The INR (international normalized ratio) number is calculated from the result of the laboratory test called prothrombin time (PT).  If an INR APPOINTMENT HAS NOT ALREADY BEEN MADE FOR YOU please schedule an appointment to have this lab work done by your health care provider within 7 days. Your INR goal is usually a number between:  2 to 3 or your provider may give you a more narrow range like 2-2.5.  Ask your health care provider during an office visit what your goal INR is.  What  do you need to  know  About  COUMADIN? Take Coumadin (warfarin) exactly as prescribed by your healthcare provider about the same time each day.  DO NOT stop taking without talking to the doctor who prescribed the medication.  Stopping without other blood clot prevention medication to take the place of Coumadin may  increase your risk of developing a new clot or stroke.  Get refills before you run out.  What do you do if you miss a dose? If you miss a dose, take it as soon as you remember on the same day then continue your regularly scheduled regimen the next day.  Do not take two doses of Coumadin at the same time.  Important Safety Information A possible side effect of Coumadin (Warfarin) is an increased risk of bleeding. You should call your healthcare provider right away if you experience any of the following: ? Bleeding from an injury or your nose that does not stop. ? Unusual colored urine (red or dark brown) or unusual colored stools (red or black). ? Unusual bruising for unknown reasons. ? A serious fall or if you hit your head (even if there is no bleeding).  Some foods or medicines interact with Coumadin (warfarin) and might alter your response to warfarin. To help avoid this: ? Eat a balanced diet, maintaining a consistent amount of Vitamin K. ? Notify your provider about major diet changes you plan to make. ? Avoid alcohol or limit your intake to 1 drink for women and 2 drinks for men per day. (1 drink is 5 oz. wine, 12 oz. beer, or 1.5 oz. liquor.)  Make sure that ANY health care provider who prescribes medication for you knows that you are taking Coumadin (warfarin).  Also make sure the healthcare provider who is monitoring your Coumadin knows when you have started a new medication including herbals and non-prescription products.  Coumadin (Warfarin)  Major Drug Interactions  Increased Warfarin Effect Decreased Warfarin Effect  Alcohol (large quantities) Antibiotics (esp. Septra/Bactrim, Flagyl, Cipro) Amiodarone (Cordarone) Aspirin (ASA) Cimetidine (Tagamet) Megestrol (Megace) NSAIDs (ibuprofen, naproxen, etc.) Piroxicam (Feldene) Propafenone (Rythmol SR) Propranolol (Inderal) Isoniazid (INH) Posaconazole (Noxafil) Barbiturates (Phenobarbital) Carbamazepine  (Tegretol) Chlordiazepoxide (Librium) Cholestyramine (Questran) Griseofulvin Oral Contraceptives Rifampin Sucralfate (Carafate) Vitamin K   Coumadin (Warfarin) Major Herbal Interactions  Increased Warfarin Effect Decreased Warfarin Effect  Garlic Ginseng Ginkgo biloba Coenzyme Q10 Green tea St. Johns wort    Coumadin (Warfarin) FOOD Interactions  Eat a consistent number of servings per week of foods HIGH in Vitamin K (1 serving =  cup)  Collards (cooked, or boiled & drained) Kale (cooked, or boiled & drained) Mustard greens (cooked, or boiled & drained) Parsley *serving size only =  cup Spinach (cooked, or boiled & drained) Swiss chard (cooked, or boiled & drained) Turnip greens (cooked, or boiled & drained)  Eat a consistent number of servings per week of foods MEDIUM-HIGH in Vitamin K (1 serving = 1 cup)  Asparagus (cooked, or boiled & drained) Broccoli (cooked, boiled & drained, or raw & chopped) Brussel sprouts (cooked, or boiled & drained) *serving size only =  cup Lettuce, raw (green leaf, endive, romaine) Spinach, raw Turnip greens, raw & chopped   These websites have more information on Coumadin (warfarin):  FailFactory.se; VeganReport.com.au;

## 2018-05-15 NOTE — Care Management Important Message (Signed)
Important Message  Patient Details  Name: Jean Stewart MRN: [id number] Date of Birth: 1945/04/02   Medicare Important Message Given:  Yes    Barb Merino Reynard Christoffersen 05/15/2018, 5:59 PM

## 2018-05-15 NOTE — Progress Notes (Addendum)
[place] for lovenox> heparin infusion, coumadin Indication:  Not on File  Patient Measurements: Height: 5\' 4"  (162.6 cm) Weight: (!) 476 lb (215.9 kg) IBW/kg (Calculated) : 54.7  Heparin dose weight: 114.5 kg   Vital Signs: Temp: 97.5 F (36.4 C) (03/10 0822) Temp Source: Oral (03/10 0822) BP: 139/112 (03/10 0822) Pulse Rate: 91 (03/10 0822)  Labs: Recent Labs    05/12/18 1207 05/13/18 0508 05/13/18 1240 05/14/18 0412 05/14/18 1205 05/15/18 0548  HGB  --  12.1  --  11.5*  --   --   HCT  --  39.4  --  38.4  --   --   PLT  --  102*  --  108*  --   --   LABPROT  --   --   --   --   --  16.1*  INR  --   --   --   --   --  1.3*  HEPARINUNFRC 1.30*  --   --   --   --   --   HEPRLOWMOCWT  --   --  1.81  --  1.30 0.67  CREATININE  --  2.35*  --  2.49*  --  2.32*    Estimated Creatinine Clearance: 40 mL/min (A) (by C-G formula based on SCr of 2.32 mg/dL (H)).   Medical History: Past Medical History:  Diagnosis Date  . Hypertension   . Morbid obesity ([place])   . Physical deconditioning   . Suspected sleep apnea    Assessment: 35 yof presented to the ED with afib and possible DVT. Originally started on enoxaparin- not on anticoagulation PTA.  Given elevated Scr in setting of obesity, plan to change to heparin infusion when IV access obtained and this was done this am. She has been on lovenox with elevated anti-Xa levels -lovenox 110mg  given around midnight on 3/8. Coumadin to be started today -LMWH: 1.3 (3/9)>> 0.67 (3/10).  -INR= 1.3  The current LMWH level is higher than anticipated. There is limited data that suggests a goal trough < 0.5. Based on estimated kinetics,  I estimate that in 12 hours the level will be < 0.5. At this level I anticipate it would be safe to begin heparin.   Goal of Therapy:  Heparin level: 0.3-0.7  LMWH level:  Trough < 0.5 Monitor platelets by anticoagulation protocol: Yes   Plan:  -Start heparin  at a conservative dose of 1150 units/hr at 5pm -Heparin level in 8 hours and daily wth CBC daily -Coumadin 7.5mg  po today -Daily PT/INR  Hildred Laser, PharmD Clinical Pharmacist **Pharmacist phone directory can now be found on [email or web address] ([email or web address]).  Listed under [place].

## 2018-05-15 NOTE — Evaluation (Signed)
Occupational Therapy Evaluation Patient Details Name: Jean Stewart MRN: [id number] DOB: 1945/04/28 Today's Date: 05/15/2018    History of Present Illness 73 yo female with onset of SOB, pulm edema, pleural effusion, a-fib with RVR, and LE edema with elevated troponin was admitted and found to have skin breakdown in skin folds.  Has renal insufficiency, potentially has sleep apnea.  PMHx:  cardiomegaly, HTN, morbid obesity, LE dermatitis,    Clinical Impression   Pt admitted with above dx and now presenting with generalized weakness and decreased cardiorespiratory support that prevents her from completing ADL transfers and ADLs in general with PLOF. Family is intent on taking pt home- OT recommendation is SNF. Demonstrated to family that pt was unable to stand from elevated EOB even with +4 assistance. If family refuses pt will need hospital bed (air mattress if this is an option for sacral wounds), hoyer lift, bariatric BSC (unless they will use bed pan), ambulance transport home, and bariatric w/c. Family claims doorways will be wide enough- suspect this is not true. OT will continue to educate and provide benefits of SNF stay.     Follow Up Recommendations  SNF;Supervision/Assistance - 24 hour    Equipment Recommendations  Other (comment)(See note)    Recommendations for Other Services PT consult     Precautions / Restrictions Precautions Precautions: Fall Precaution Comments: purwick, IV Restrictions Weight Bearing Restrictions: No      Mobility Bed Mobility Overal bed mobility: Needs Assistance Bed Mobility: Supine to Sit;Sit to Supine     Supine to sit: Max assist;+2 for physical assistance;HOB elevated Sit to supine: Total assist;+2 for physical assistance   General bed mobility comments: Total A +2 to return to bed, bed features to scoot up  Transfers Overall transfer level: Needs assistance Equipment used: None Transfers: Sit to/from Stand            General transfer comment: Attempted sit <> stand EOB with +3 and +4 assistance. Pt unable to clear hips from bed at all    Balance Overall balance assessment: Needs assistance Sitting-balance support: Feet supported;Bilateral upper extremity supported Sitting balance-Leahy Scale: Poor Sitting balance - Comments: frequent posterior lean, no overt LOB but mod A overall Postural control: Posterior lean                                 ADL either performed or assessed with clinical judgement   ADL Overall ADL's : Needs assistance/impaired Eating/Feeding: Set up;Supervision/ safety;Sitting   Grooming: Wash/dry hands;Wash/dry face;Bed level;Minimal assistance   Upper Body Bathing: Minimal assistance;Bed level   Lower Body Bathing: Total assistance;+2 for physical assistance;+2 for safety/equipment;Bed level   Upper Body Dressing : Bed level;Maximal assistance   Lower Body Dressing: Total assistance;+2 for physical assistance;+2 for safety/equipment;Bed level     Toilet Transfer Details (indicate cue type and reason): Bed pan only                 Vision Baseline Vision/History: No visual deficits Patient Visual Report: No change from baseline Vision Assessment?: No apparent visual deficits            Pertinent Vitals/Pain Pain Assessment: Faces Faces Pain Scale: Hurts little more Pain Location: BLE  Pain Descriptors / Indicators: Aching Pain Intervention(s): Limited activity within patient's tolerance     Hand Dominance Right   Extremity/Trunk Assessment Upper Extremity Assessment Upper Extremity Assessment: Generalized weakness   Lower Extremity Assessment Lower Extremity Assessment:  Generalized weakness   Cervical / Trunk Assessment Cervical / Trunk Assessment: Kyphotic   Communication Communication Communication: No difficulties   Cognition Arousal/Alertness: Awake/alert Behavior During Therapy: Flat affect Overall Cognitive Status:  History of cognitive impairments - at baseline                                 General Comments: Pt requires moderate to heavy cueing for bed mobility and transfers   General Comments  Pt's daughter and son in law present throughout session and very involved. Pt's BP 107/85 supine at start of session. Desat to 86% once EOB, rebounded with cueing for breathing techniques            Home Living Family/patient expects to be discharged to:: Private residence   Available Help at Discharge: Family;Available 24 hours/day Type of Home: House Home Access: Level entry     Home Layout: One level     Bathroom Shower/Tub: Tub/shower unit;Walk-in shower   Bathroom Toilet: Standard     Home Equipment: Environmental consultant - 2 wheels          Prior Functioning/Environment Level of Independence: Needs assistance  Gait / Transfers Assistance Needed: used RW for very short trips ADL's / Homemaking Assistance Needed: Had family to assist her with house   Comments: Unclear how bedbound pt really was        OT Problem List: Decreased strength;Decreased range of motion;Decreased activity tolerance;Impaired balance (sitting and/or standing);Decreased safety awareness;Decreased knowledge of precautions;Decreased knowledge of use of DME or AE;Impaired tone;Obesity;Pain;Increased edema;Impaired sensation;Cardiopulmonary status limiting activity      OT Treatment/Interventions: Self-care/ADL training;Therapeutic exercise;Energy conservation;DME and/or AE instruction;Balance training;Patient/family education;Therapeutic activities    OT Goals(Current goals can be found in the care plan section) Acute Rehab OT Goals Patient Stated Goal: none stated OT Goal Formulation: With patient/family Time For Goal Achievement: 05/25/18 Potential to Achieve Goals: Fair  OT Frequency: Min 2X/week    AM-PAC OT "6 Clicks" Daily Activity     Outcome Measure Help from another person eating meals?: A  Little Help from another person taking care of personal grooming?: A Little Help from another person toileting, which includes using toliet, bedpan, or urinal?: Total Help from another person bathing (including washing, rinsing, drying)?: A Lot Help from another person to put on and taking off regular upper body clothing?: A Lot Help from another person to put on and taking off regular lower body clothing?: Total 6 Click Score: 12   End of Session Equipment Utilized During Treatment: Gait belt;Oxygen Nurse Communication: Mobility status  Activity Tolerance: Patient tolerated treatment well Patient left: in bed;with call bell/phone within reach;with family/visitor present  OT Visit Diagnosis: Unsteadiness on feet (R26.81);Muscle weakness (generalized) (M62.81)                Time: 0354-6568 OT Time Calculation (min): 40 min Charges:  OT General Charges $OT Visit: 1 Visit OT Evaluation $OT Eval Moderate Complexity: 1 Mod OT Treatments $Therapeutic Activity: 23-37 mins  Curtis Sites OTR/L  05/15/2018, 10:36 AM

## 2018-05-16 ENCOUNTER — Encounter (HOSPITAL_COMMUNITY): Payer: Self-pay | Admitting: Radiology

## 2018-05-16 ENCOUNTER — Inpatient Hospital Stay (HOSPITAL_COMMUNITY): Payer: Medicare Other

## 2018-05-16 LAB — PROTIME-INR
INR: 1.3 — ABNORMAL HIGH (ref 0.8–1.2)
Prothrombin Time: 16.2 seconds — ABNORMAL HIGH (ref 11.4–15.2)

## 2018-05-16 LAB — CBC
HCT: 35.8 % — ABNORMAL LOW (ref 36.0–46.0)
Hemoglobin: 10.9 g/dL — ABNORMAL LOW (ref 12.0–15.0)
MCH: 30.8 pg (ref 26.0–34.0)
MCHC: 30.4 g/dL (ref 30.0–36.0)
MCV: 101.1 fL — ABNORMAL HIGH (ref 80.0–100.0)
Platelets: 122 10*3/uL — ABNORMAL LOW (ref 150–400)
RBC: 3.54 MIL/uL — ABNORMAL LOW (ref 3.87–5.11)
RDW: 14.2 % (ref 11.5–15.5)
WBC: 5.1 10*3/uL (ref 4.0–10.5)
nRBC: 0 % (ref 0.0–0.2)

## 2018-05-16 LAB — BASIC METABOLIC PANEL
Anion gap: 5 (ref 5–15)
BUN: 55 mg/dL — AB (ref 8–23)
CO2: 32 mmol/L (ref 22–32)
Calcium: 9 mg/dL (ref 8.9–10.3)
Chloride: 99 mmol/L (ref 98–111)
Creatinine, Ser: 1.84 mg/dL — ABNORMAL HIGH (ref 0.44–1.00)
GFR calc Af Amer: 31 mL/min — ABNORMAL LOW (ref >60)
GFR calc non Af Amer: 27 mL/min — ABNORMAL LOW (ref >60)
Glucose, Bld: 116 mg/dL — ABNORMAL HIGH (ref 70–99)
POTASSIUM: 4.5 mmol/L (ref 3.5–5.1)
Sodium: 136 mmol/L (ref 135–145)

## 2018-05-16 LAB — HEPARIN LEVEL (UNFRACTIONATED)
HEPARIN UNFRACTIONATED: 0.47 [IU]/mL (ref 0.30–0.70)
Heparin Unfractionated: 0.51 IU/mL (ref 0.30–0.70)

## 2018-05-16 LAB — GLUCOSE, CAPILLARY: Glucose-Capillary: 108 mg/dL — ABNORMAL HIGH (ref 70–99)

## 2018-05-16 MED ORDER — WARFARIN SODIUM 7.5 MG PO TABS
7.5000 mg | ORAL_TABLET | Freq: Once | ORAL | Status: AC
  Administered 2018-05-16: 7.5 mg via ORAL
  Filled 2018-05-16: qty 1

## 2018-05-16 MED ORDER — FUROSEMIDE 40 MG PO TABS
40.0000 mg | ORAL_TABLET | Freq: Two times a day (BID) | ORAL | Status: DC
  Administered 2018-05-16 – 2018-05-17 (×3): 40 mg via ORAL
  Filled 2018-05-16 (×4): qty 1

## 2018-05-16 NOTE — Progress Notes (Signed)
[place] for Lovenox>>>Heparin/warfarin  Indication: Afib, ?DVT  Patient Measurements: Height: 5\' 4"  (162.6 cm) Weight: (!) 476 lb (215.9 kg) IBW/kg (Calculated) : 54.7  Heparin dose weight: 114.5 kg   Vital Signs: BP: 108/82 (03/10 2303) Pulse Rate: 97 (03/10 2303)  Labs: Recent Labs    05/13/18 1240 05/14/18 0412 05/14/18 1205 05/15/18 0548 05/16/18 0500  HGB  --  11.5*  --   --  10.9*  HCT  --  38.4  --   --  35.8*  PLT  --  108*  --   --  122*  LABPROT  --   --   --  16.1*  --   INR  --   --   --  1.3*  --   HEPARINUNFRC  --   --   --   --  0.51  HEPRLOWMOCWT 1.81  --  1.30 0.67  --   CREATININE  --  2.49*  --  2.32*  --     Estimated Creatinine Clearance: 40 mL/min (A) (by C-G formula based on SCr of 2.32 mg/dL (H)).   Medical History: Past Medical History:  Diagnosis Date  . Hypertension   . Morbid obesity ([place])   . Physical deconditioning   . Suspected sleep apnea    Assessment: 24 yof presented to the ED with afib and possible DVT. Originally started on enoxaparin- not on anticoagulation PTA.  Given elevated Scr in setting of obesity, plan to change to heparin infusion when IV access obtained and this was done this am. She has been on lovenox with elevated anti-Xa levels -lovenox 110mg  given around midnight on 3/8. Coumadin to be started today -LMWH: 1.3 (3/9)>> 0.67 (3/10).  -INR= 1.3  3/11 AM update: heparin level therapeutic this AM  Goal of Therapy:  Heparin level: 0.3-0.7 units/mL Monitor platelets by anticoagulation protocol: Yes   Plan:  Cont heparin at 1150 units/hr Confirmatory heparin level at [place], PharmD, [place] Pharmacist Phone: [phone]

## 2018-05-16 NOTE — Plan of Care (Signed)

## 2018-05-16 NOTE — Progress Notes (Signed)
Physical Therapy Treatment Patient Details Name: Jean Stewart MRN: [id number] DOB: 1945/10/02 Today's Date: 05/16/2018    History of Present Illness 73 yo female with onset of SOB, pulm edema, pleural effusion, a-fib with RVR, and LE edema with elevated troponin was admitted and found to have skin breakdown in skin folds.  Has renal insufficiency, potentially has sleep apnea.  PMHx:  cardiomegaly, HTN, morbid obesity, LE dermatitis,     PT Comments    Pt admitted with above diagnosis. Pt currently with functional limitations due to the deficits listed below (see PT Problem List). Pt only able to perform bed level activities and PT assisted nursing with rollling so they could change dressings.  Pt is difficult to move safely therefore asked MD for Vital Go tilt bed and MD agreed.  Will order bed.   Pt will benefit from skilled PT to increase their independence and safety with mobility to allow discharge to the venue listed below.    Follow Up Recommendations  SNF     Equipment Recommendations  None recommended by PT    Recommendations for Other Services       Precautions / Restrictions Precautions Precautions: Fall Precaution Comments: purewick, IV Restrictions Weight Bearing Restrictions: No    Mobility  Bed Mobility Overal bed mobility: Needs Assistance Bed Mobility: Supine to Sit;Rolling Rolling: Total assist(+3 for full roll.)   Supine to sit: +2 for physical assistance;HOB elevated;Total assist     General bed mobility comments: Pt unable to sit up.  Just changed her dressings and she had done a lot of rollng.  Actually helped nursing roll her with need for +3 max assist to roll pt  Transfers                 General transfer comment: REcommend Vital Go tilt bed for pt and asked MD if PT can order.    Ambulation/Gait             General Gait Details: unable to stand   Stairs             Wheelchair Mobility    Modified Rankin (Stroke  Patients Only)       Balance                                            Cognition Arousal/Alertness: Awake/alert Behavior During Therapy: Flat affect Overall Cognitive Status: History of cognitive impairments - at baseline                                 General Comments: Pt requires moderate to heavy cueing for bed mobility and transfers      Exercises General Exercises - Upper Extremity Shoulder Flexion: AROM;Both;10 reps;Supine Elbow Flexion: AROM;Both;10 reps;Supine General Exercises - Lower Extremity Ankle Circles/Pumps: AROM;Both;10 reps;Supine Quad Sets: AROM;Both;5 reps;Supine Heel Slides: Both;AAROM;10 reps;Supine Hip ABduction/ADduction: AAROM;Both;10 reps;Supine    General Comments        Pertinent Vitals/Pain Pain Assessment: Faces Faces Pain Scale: Hurts whole lot Pain Location: BLE, buttocks  Pain Descriptors / Indicators: Aching Pain Intervention(s): Limited activity within patient's tolerance;Monitored during session;Premedicated before session    Home Living                      Prior Function  PT Goals (current goals can now be found in the care plan section) Acute Rehab PT Goals Patient Stated Goal: none stated Progress towards PT goals: Not progressing toward goals - comment(Bed level today as pt having difficulty assisting PT)    Frequency    Min 2X/week      PT Plan Current plan remains appropriate    Co-evaluation              AM-PAC PT "6 Clicks" Mobility   Outcome Measure  Help needed turning from your back to your side while in a flat bed without using bedrails?: Total Help needed moving from lying on your back to sitting on the side of a flat bed without using bedrails?: Total Help needed moving to and from a bed to a chair (including a wheelchair)?: Total Help needed standing up from a chair using your arms (e.g., wheelchair or bedside chair)?: Total Help needed to  walk in hospital room?: Total Help needed climbing 3-5 steps with a railing? : Total 6 Click Score: 6    End of Session Equipment Utilized During Treatment: Oxygen;Gait belt Activity Tolerance: Patient limited by fatigue Patient left: in bed;with call bell/phone within reach Nurse Communication: Mobility status;Need for lift equipment PT Visit Diagnosis: Muscle weakness (generalized) (M62.81);Difficulty in walking, not elsewhere classified (R26.2);Adult, failure to thrive (R62.7)     Time: 2706-2376 PT Time Calculation (min) (ACUTE ONLY): 20 min  Charges:  $Therapeutic Exercise: 8-22 mins                     [place] Pager:  [phone]  Office:  [place] 05/16/2018, 1:12 PM

## 2018-05-16 NOTE — Progress Notes (Signed)
[place] for Heparin/warfarin  Indication: Afib, ?DVT  Patient Measurements: Height: 5\' 4"  (162.6 cm) Weight: (!) 487 lb 9.6 oz (221.2 kg) IBW/kg (Calculated) : 54.7  Heparin dose weight: 114.5 kg   Vital Signs: Temp: 97.6 F (36.4 C) (03/11 1219) Temp Source: Oral (03/11 1219) BP: 107/56 (03/11 0808) Pulse Rate: 91 (03/11 0808)  Labs: Recent Labs    05/14/18 0412 05/14/18 1205 05/15/18 0548 05/16/18 0500 05/16/18 1122 05/16/18 1305  HGB 11.5*  --   --  10.9*  --   --   HCT 38.4  --   --  35.8*  --   --   PLT 108*  --   --  122*  --   --   LABPROT  --   --  16.1* 16.2*  --   --   INR  --   --  1.3* 1.3*  --   --   HEPARINUNFRC  --   --   --  0.51  --  0.47  HEPRLOWMOCWT  --  1.30 0.67  --   --   --   CREATININE 2.49*  --  2.32*  --  1.84*  --      Medical History: Past Medical History:  Diagnosis Date  . Hypertension   . Morbid obesity ([place])   . Physical deconditioning   . Suspected sleep apnea    Assessment: 89 yof presented to the ED with afib and possible DVT. Originally started on enoxaparin- not on anticoagulation PTA.  Given elevated Scr in setting of obesity, patient was changed to heparin infusion.  Warfarin was started yesterday evening. INR 1.3.   Goal of Therapy:  Heparin level: 0.3-0.7 units/mL Monitor platelets by anticoagulation protocol: Yes   Plan:  -warfarin 7.5 mg po x1 -daily INR -continue heparin infusion at 1150 units/hr   Harvel Quale 05/16/2018 2:53 PM

## 2018-05-16 NOTE — Progress Notes (Signed)
Progress Note  Patient Name: Jean Stewart Date of Encounter: 05/16/2018  Primary Cardiologist: Ena Dawley, MD   Subjective   Doesn't feel well. Weak. Denies SOB. No chest pain.  Inpatient Medications    Scheduled Meds: . aspirin EC  81 mg Oral Daily  . feeding supplement (PRO-STAT SUGAR FREE 64)  30 mL Oral BID  . furosemide  40 mg Intravenous BID  . metoprolol succinate  100 mg Oral Daily  . nystatin cream   Topical BID  . sodium chloride flush  10-40 mL Intracatheter Q12H  . Warfarin - Pharmacist Dosing Inpatient   Does not apply q1800   Continuous Infusions: . heparin 1,150 Units/hr (05/16/18 0500)  . sodium chloride     PRN Meds: acetaminophen, albuterol, docusate sodium, HYDROcodone-acetaminophen, sodium chloride flush, Zinc Oxide   Vital Signs    Vitals:   05/15/18 2303 05/16/18 0702 05/16/18 0703 05/16/18 0808  BP: 108/82 93/66  (!) 107/56  Pulse: 97 76  91  Resp:  15    Temp:   97.8 F (36.6 C)   TempSrc:   Oral   SpO2: 96%  98%   Weight:   (!) 221.2 kg   Height:        Intake/Output Summary (Last 24 hours) at 05/16/2018 1010 Last data filed at 05/16/2018 0500 Gross per 24 hour  Intake 491.21 ml  Output 700 ml  Net -208.79 ml   Last 3 Weights 05/16/2018 05/15/2018 05/14/2018  Weight (lbs) 487 lb 9.6 oz 476 lb 450 lb  Weight (kg) 221.174 kg 215.912 kg 204.119 kg      Telemetry    Atrial fibrillation rate 80-90s- Personally Reviewed  ECG    None today- Personally Reviewed  Physical Exam   GEN: super morbidly obese AAF in No acute distress.   Neck: No JVD Cardiac: irregularly irregular rhythm w tachy rate, no murmurs, rubs, or gallops.  Respiratory: Clear to auscultation bilaterally. GI: obese abdomen, soft, nontender, non-distended  MS: obese extremities. No pitting edema. Neuro:  Nonfocal  Psych: Normal affect   Labs    Chemistry Recent Labs  Lab 05/11/18 1325  05/13/18 0508 05/14/18 0412 05/15/18 0548  NA 142   < > 139  137 135  K 4.3   < > 4.7 4.6 4.6  CL 104   < > 100 102 99  CO2 29   < > 28 28 29   GLUCOSE 100*   < > 111* 112* 103*  BUN 36*   < > 47* 51* 54*  CREATININE 2.21*   < > 2.35* 2.49* 2.32*  CALCIUM 9.0   < > 8.4* 8.3* 8.7*  PROT 6.6  --   --   --   --   ALBUMIN 3.4*  --   --   --   --   AST 46*  --   --   --   --   ALT 30  --   --   --   --   ALKPHOS 66  --   --   --   --   BILITOT 1.2  --   --   --   --   GFRNONAA 21*   < > 20* 18* 20*  GFRAA 25*   < > 23* 21* 23*  ANIONGAP 9   < > 11 7 7    < > = values in this interval not displayed.     Hematology Recent Labs  Lab 05/13/18 [phone] 05/14/18 0412 05/16/18 0500  WBC 9.0 7.1 5.1  RBC 3.87 3.80* 3.54*  HGB 12.1 11.5* 10.9*  HCT 39.4 38.4 35.8*  MCV 101.8* 101.1* 101.1*  MCH 31.3 30.3 30.8  MCHC 30.7 29.9* 30.4  RDW 15.0 14.7 14.2  PLT 102* 108* 122*    Cardiac Enzymes Recent Labs  Lab 05/11/18 1325 05/11/18 1943 05/11/18 2257 05/12/18 0443  TROPONINI 0.06* 0.06* 0.07* 0.07*   No results for input(s): TROPIPOC in the last 168 hours.   BNP Recent Labs  Lab 05/11/18 1325  BNP 309.8*     DDimer No results for input(s): DDIMER in the last 168 hours.   Radiology    No results found.  Cardiac Studies   2D Echo 05/12/18 IMPRESSIONS    1. The left ventricle has mild-moderately reduced systolic function, with an ejection fraction of 40-45%. The cavity size was normal. There is moderate concentric left ventricular hypertrophy. Left ventricular diastology could not be evaluated Elevated  left ventricular end-diastolic pressure.  2. The right ventricle has moderately reduced systolic function. The cavity was normal. There is no increase in right ventricular wall thickness.  3. Left atrial size was moderately dilated.  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 The MR jet is posteriorly-directed.  5. The tricuspid valve is normal in structure. Tricuspid  valve regurgitation is moderate-severe.  6. The aortic valve is tricuspid Mild thickening of the aortic valve Mild calcification of the aortic valve.  7. There is evidence of plaque in the ascending aorta.  8. Pulmonary hypertension is moderate.  9. The inferior vena cava was dilated in size with <50% respiratory variability. 10. Mild hypokinesis of the left ventricular inferior wall.  Patient Profile   Jean Stewart is a 73 y.o. female with a hx of severe morbid obesity (BMI 84), ? blood clot 5 years ago, deconditioning, suspected OSA (per daughter) and HTN who is being seen today for the evaluation of atrial fibrillation with RVR and abnormal echo/CHF at the request of Dr. Wyonia Hough. She is admitted with inability to stand up due to lack of strength, worsening leg swelling, DOE, AF RVR, hypoxia on RA, renal insufficiency with Cr of 2.21 with unknown baseline, and soft BP.  Assessment & Plan   1. Acute combined systolic diastolic CHF: LVEF reduced by echo 40-45% with diffuse hypokinesis. Chronicity unknown. No prior echos for comparison. Diuretics were increased  due to volume overload, 40 IV Lasix BID.  Really difficult  to assess volume due to body habitus/ super morbid obesity but I am not overly impressed that she has a lot of edema- mostly obese. Renal function is improved today. Weight is not reliable. I think it would be reasonable to transition to po lasix. If BP allows could consider addition of nitrates/hydralazine. Not a candidate for ACEi,ARB,Aldactone due to CKD. Currently BP is too soft to add nitrates/hydralazine.  2. Atrial fibrillation with RVR: new diagnosis. Was initially placed on IV Cardizem but discontinued due to hypotension. Then placed on IV amiodarone. This was stopped and HR now well controlled on Toprol XL alone.  On IV heparin. Started po coumadin per pharmacy. Would continue long term for Afib.   Given super morbid obesity she is a poor candidate for NOACs.   3.  Acute respiratory failure with hypoxia and hypercapnia: VQ scan  very low probability for PE. Multifactorial, acute on chronic combined systolic and diastolic CHF, obesity hypoventilation syndrome, pulmonary HTN, Afib w/ RVR, mitral and tricuspid  valve regurgitation. I suspect hypoventilation is the primary issue  4. Obesity hypoventilation syndrome: highly likely obstructive sleep apnea.   5. Mildly Elevated Troponin>>Demand ischemia: trops 0.06>>0.07. In the settings of CHF renal failure  6. Renal failure: unknown chronicity, baseline known. Scr has been trending up this admit 1.99>>2.35>>2.49. improved today to 2.32. would avoid overly aggressive diuresis and hypotension. From my standpoint can transition to po lasix.  7. 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 Tricuspid Regurgitation: moderate to severe by echo.   9. Pulmonary HTN: moderate by echo. Likely 2/2 obesity hyperventilation syndrome and possible OSA.   10. Super Morbid Obesity: Body mass index is 83.7 kg/m.   [facility] will sign off.   Medication Recommendations:  Per [facility] Other recommendations (labs, testing, etc):  none Follow up as an outpatient:  F/u with [facility] - Dr Meda Coffee in 4-6 weeks.     For questions or updates, please contact [place] Please consult [email or web address] for contact info under        Signed,  [place], MD  05/16/2018, 10:10 AM

## 2018-05-16 NOTE — TOC Progression Note (Signed)
Transition of Care [facility]) - Progression Note    Patient Details  Name: Jean Stewart MRN: [id number] Date of Birth: 09-25-1945  Transition of Care [facility]) CM/SW Contact  Graves-Bigelow, Ocie Cornfield, RN Phone Number: 05/16/2018, 12:48 PM  Clinical Narrative:   Pt presented for [place] Fib- RVR- initially from home. Plan will be to transition to daughters home and she will provide patient with 24 hours supervision. CM did make referral to [facility] with [facility] [place] to begin within 24-48 hours post transition home. Daughter is working on getting patient established at a PCP office via [place] in network. CM will continue to monitor for additional transition of care needs.     Expected Discharge Plan: [place] Barriers to Discharge: No Barriers Identified(Daughter opted for Home with [facility]. )  Expected Discharge Plan and Services Expected Discharge Plan: [place] Discharge Planning Services: CM Consult Post Acute Care Choice: [place] arrangements for the past 2 months: Apartment                 DME Arranged: N/A DME Agency: NA HH Arranged: RN, Disease Management, PT, OT, Nurse's Aide, Refused SNF, Social Work [employer] Agency: [facility]   Social Determinants of Health (SDOH) Interventions    Readmission Risk Interventions 30 Day Unplanned Readmission Risk Score     ED to Hosp-Admission (Current) from 05/11/2018 in [place] [facility]  30 Day Unplanned Readmission Risk Score (%)  13 Filed at 05/16/2018 1200     This score is the patient's risk of an unplanned readmission within 30 days of being discharged (0 -100%). The score is based on dignosis, age, lab data, medications, orders, and past utilization.   Low:  0-14.9   Medium: 15-21.9   High: 22-29.9   Extreme: 30 and above       No flowsheet data found.

## 2018-05-16 NOTE — Progress Notes (Signed)
PROGRESS NOTE    [name]  OMV:[id number] DOB: Jan 24, 1946 DOA: 05/11/2018 PCP: Patient, No Pcp Per     Brief Narrative:  Jean Stewart is a 73 yo AAF with hx of hypertension and obesity who is a very poor historian. She presented to the ER with complaints of generalized weakness and significant LE swelling. She subsequently reported its been ongoing for over a year. Patient was found in the ER with A. fib with RVR, heart rate as high as 129, blood pressure is low normal 97/57. She was saturating 88% on room air and was put on 4 L of oxygen. Troponin 0.06. Creatinine 2.21, baseline unknown.A chest x-ray shows cardiomegaly. Twelve-lead EKG shows A. fib with RVR.  Patient was started on Cardizem infusion in the ER.  Eventually, cardizem transitioned to PO Toprol. PT recommended SNF.   New events last 24 hours / Subjective: States she feels "so-so"   Assessment & Plan:   Principal Problem:   Atrial fibrillation with RVR (HCC) Active Problems:   Obesity, Class III, BMI 40-49.9 (morbid obesity) (HCC)   Essential hypertension   Renal insufficiency   New onset a-fib (HCC)   Acute respiratory failure with hypoxia and hypercapnia (HCC)   Elevated troponin   Pressure injury of skin   Acute respiratory failure with hypoxia and hypercapnia -Likelymultifactorial withundiagnosed underlying sleep apnea, obesity hypoventialtion,congestive heart failuresystolic 62% EF andnew onset A. Fib -Evaluation for PE showsnegativefor presence of DVT. VQ scan low probability -Weaned off BiPAP now on [place] O2   A. fib with RVR -Weaned offdilt gtt given her reduced EF, continue with Toprol XL  -Patients super morbid obesity makes NOAC a poor choice -IV heparin with bridge to coumadin  Acute combined systolic and diastolic CHF -EF 83-66%  -Transition to po lasix. 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 Will need sleep apnea evaluation as an outpatient      DVT prophylaxis: Heparin gtt and coumadin  Code Status: Full Family Communication: No family at bedside Disposition Plan: Pending SNF placement    Antimicrobials:  Anti-infectives (From admission, onward)   None        Objective: Vitals:   05/16/18 0702 05/16/18 0703 05/16/18 0808 05/16/18 1219  BP: 93/66  (!) 107/56   Pulse: 76  91   Resp: 15     Temp:  97.8 F (36.6 C)  97.6 F (36.4 C)  TempSrc:  Oral  Oral  SpO2:  98%    Weight:  (!) 221.2 kg    Height:        Intake/Output Summary (Last 24 hours) at 05/16/2018 1349 Last data filed at 05/16/2018 0500 Gross per 24 hour  Intake 491.21 ml  Output 700 ml  Net -208.79 ml   Filed Weights   05/14/18 0524 05/15/18 0442 05/16/18 0703  Weight: (!) 204.1 kg (!) 215.9 kg (!) 221.2 kg    Examination:  General exam: Appears calm and comfortable  Respiratory system: Clear to auscultation. Respiratory effort normal. Cardiovascular system: S1 & S2 heard, Irreg rhythm rate 90s. No JVD, murmurs, rubs, gallops or clicks. No pedal edema. Gastrointestinal system: Abdomen is nondistended, soft and nontender. No organomegaly or masses felt. Normal bowel sounds heard. Obese Central nervous system: Alert and oriented. No focal  neurological deficits. Extremities: Symmetric 5 x 5 power. Skin: No rashes, lesions or ulcers Psychiatry: Judgement and insight appear normal. Mood & affect appropriate.   Data Reviewed: I have personally reviewed following labs and imaging studies  CBC: Recent Labs  Lab 05/11/18 1325 05/11/18 1735 05/12/18 0443 05/13/18 0508 05/14/18 0412 05/16/18 0500  WBC 7.9  --  8.4 9.0 7.1 5.1  NEUTROABS 6.3  --   --  7.0 5.4  --   HGB 13.1 12.9 12.9 12.1 11.5* 10.9*  HCT 42.2  38.0 41.8 39.4 38.4 35.8*  MCV 101.4*  --  101.2* 101.8* 101.1* 101.1*  PLT 110*  --  105* 102* 108* 101*   Basic Metabolic Panel: Recent Labs  Lab 05/12/18 0443 05/13/18 0508 05/14/18 0412 05/15/18 0548 05/16/18 1122  NA 141 139 137 135 136  K 4.7 4.7 4.6 4.6 4.5  CL 102 100 102 99 99  CO2 24 28 28 29  32  GLUCOSE 89 111* 112* 103* 116*  BUN 40* 47* 51* 54* 55*  CREATININE 1.99* 2.35* 2.49* 2.32* 1.84*  CALCIUM 8.8* 8.4* 8.3* 8.7* 9.0   GFR: Estimated Creatinine Clearance: 51.4 mL/min (A) (by C-G formula based on SCr of 1.84 mg/dL (H)). Liver Function Tests: Recent Labs  Lab 05/11/18 1325  AST 46*  ALT 30  ALKPHOS 66  BILITOT 1.2  PROT 6.6  ALBUMIN 3.4*   No results for input(s): LIPASE, AMYLASE in the last 168 hours. No results for input(s): AMMONIA in the last 168 hours. Coagulation Profile: Recent Labs  Lab 05/15/18 0548 05/16/18 0500  INR 1.3* 1.3*   Cardiac Enzymes: Recent Labs  Lab 05/11/18 1325 05/11/18 1943 05/11/18 2257 05/12/18 0443  TROPONINI 0.06* 0.06* 0.07* 0.07*   BNP (last 3 results) No results for input(s): PROBNP in the last 8760 hours. HbA1C: No results for input(s): HGBA1C in the last 72 hours. CBG: No results for input(s): GLUCAP in the last 168 hours. Lipid Profile: No results for input(s): CHOL, HDL, LDLCALC, TRIG, CHOLHDL, LDLDIRECT in the last 72 hours. Thyroid Function Tests: No results for input(s): TSH, T4TOTAL, FREET4, T3FREE, THYROIDAB in the last 72 hours. Anemia Panel: No results for input(s): VITAMINB12, FOLATE, FERRITIN, TIBC, IRON, RETICCTPCT in the last 72 hours. Sepsis Labs: No results for input(s): PROCALCITON, LATICACIDVEN in the last 168 hours.  No results found for this or any previous visit (from the past 240 hour(s)).     Radiology Studies: No results found.    Scheduled Meds: . aspirin EC  81 mg Oral Daily  . feeding supplement (PRO-STAT SUGAR FREE 64)  30 mL Oral BID  . furosemide  40 mg Oral  BID  . metoprolol succinate  100 mg Oral Daily  . nystatin cream   Topical BID  . sodium chloride flush  10-40 mL Intracatheter Q12H  . Warfarin - Pharmacist Dosing Inpatient   Does not apply q1800   Continuous Infusions: . heparin 1,150 Units/hr (05/16/18 1317)  . sodium chloride       LOS: 5 days    Time spent: 40 minutes   Dessa Phi, DO [facility] [email or web address] 05/16/2018, 1:49 PM

## 2018-05-17 LAB — BLOOD GAS, ARTERIAL
Acid-Base Excess: 7 mmol/L — ABNORMAL HIGH (ref 0.0–2.0)
Bicarbonate: 32.5 mmol/L — ABNORMAL HIGH (ref 20.0–28.0)
Drawn by: 252031
O2 Content: 2 L/min
O2 Saturation: 94.6 %
Patient temperature: 98.6
pCO2 arterial: 61.2 mmHg — ABNORMAL HIGH (ref 32.0–48.0)
pH, Arterial: 7.345 — ABNORMAL LOW (ref 7.350–7.450)
pO2, Arterial: 76 mmHg — ABNORMAL LOW (ref 83.0–108.0)

## 2018-05-17 LAB — CBC
HCT: 30.4 % — ABNORMAL LOW (ref 36.0–46.0)
Hemoglobin: 9.4 g/dL — ABNORMAL LOW (ref 12.0–15.0)
MCH: 30.7 pg (ref 26.0–34.0)
MCHC: 30.9 g/dL (ref 30.0–36.0)
MCV: 99.3 fL (ref 80.0–100.0)
NRBC: 0 % (ref 0.0–0.2)
Platelets: 125 10*3/uL — ABNORMAL LOW (ref 150–400)
RBC: 3.06 MIL/uL — ABNORMAL LOW (ref 3.87–5.11)
RDW: 14 % (ref 11.5–15.5)
WBC: 5.4 10*3/uL (ref 4.0–10.5)

## 2018-05-17 LAB — BASIC METABOLIC PANEL
Anion gap: 5 (ref 5–15)
BUN: 58 mg/dL — ABNORMAL HIGH (ref 8–23)
CHLORIDE: 99 mmol/L (ref 98–111)
CO2: 32 mmol/L (ref 22–32)
Calcium: 8.6 mg/dL — ABNORMAL LOW (ref 8.9–10.3)
Creatinine, Ser: 1.74 mg/dL — ABNORMAL HIGH (ref 0.44–1.00)
GFR calc Af Amer: 33 mL/min — ABNORMAL LOW (ref >60)
GFR calc non Af Amer: 28 mL/min — ABNORMAL LOW (ref >60)
GLUCOSE: 110 mg/dL — AB (ref 70–99)
Potassium: 4.5 mmol/L (ref 3.5–5.1)
Sodium: 136 mmol/L (ref 135–145)

## 2018-05-17 LAB — PROTIME-INR
INR: 1.4 — ABNORMAL HIGH (ref 0.8–1.2)
Prothrombin Time: 16.9 seconds — ABNORMAL HIGH (ref 11.4–15.2)

## 2018-05-17 LAB — HEPARIN LEVEL (UNFRACTIONATED): HEPARIN UNFRACTIONATED: 0.38 [IU]/mL (ref 0.30–0.70)

## 2018-05-17 MED ORDER — WARFARIN SODIUM 10 MG PO TABS
10.0000 mg | ORAL_TABLET | Freq: Once | ORAL | Status: AC
  Administered 2018-05-17: 10 mg via ORAL
  Filled 2018-05-17: qty 1

## 2018-05-17 NOTE — TOC Progression Note (Addendum)
Transition of Care [facility]) - Progression Note    Patient Details  Name: Jean Stewart MRN: [id number] Date of Birth: April 08, 1945  [facility]) CM/SW Contact  Graves-Bigelow, Ocie Cornfield, RN Phone Number: 05/17/2018, 2:11 PM  Clinical Narrative:  CM spoke with MD and daughter regarding DME- orders placed in Epic. Daughter agreed to have DME delivered from [place]. Referral made to [facility] with [employer]. DME will be delivered to home address provided. CM will continue to monitor for additional transition of care needs.   05-18-18 [place] has been ordered and DME should be delivered to home Sat 05-19-18. Patient will need ambulance transport home.    Expected Discharge Plan: [place] Barriers to Discharge: No Barriers Identified(Daughter opted for [facility] with [facility]. )  Expected Discharge Plan and Services Expected Discharge Plan: [place] Discharge Planning Services: CM Consult Post Acute Care Choice: Durable Medical Equipment Living arrangements for the past 2 months: Apartment                 DME Arranged: Bedside commode, Wheelchair manual, Hospital bed, Specialty mattress DME Agency: [employer] HH Arranged: RN, Disease Management, PT, OT, Nurse's Aide, Refused SNF, Social Work [employer] Agency: [facility]   Social Determinants of Health (SDOH) Interventions    Readmission Risk Interventions 30 Day Unplanned Readmission Risk Score     ED to Hosp-Admission (Current) from 05/11/2018 in [place] [facility]  30 Day Unplanned Readmission Risk Score (%)  13 Filed at 05/17/2018 1200     This score is the patient's risk of an unplanned readmission within 30 days of being discharged (0 -100%). The score is based on dignosis, age, lab data, medications, orders, and past utilization.   Low:  0-14.9   Medium: 15-21.9   High: 22-29.9   Extreme: 30 and above       No flowsheet data found.

## 2018-05-17 NOTE — Progress Notes (Signed)
[place] for Heparin/warfarin  Indication: Afib  Patient Measurements: Height: 5\' 4"  (162.6 cm) Weight: (!) 487 lb 9.6 oz (221.2 kg) IBW/kg (Calculated) : 54.7  Heparin dose weight: 114.5 kg   Vital Signs: Temp: 97.9 F (36.6 C) (03/12 0753) Temp Source: Oral (03/12 0753) BP: 98/63 (03/12 0753) Pulse Rate: 99 (03/12 0753)  Labs: Recent Labs    05/14/18 1205 05/15/18 0548 05/16/18 0500 05/16/18 1122 05/16/18 1305 05/17/18 0320  HGB  --   --  10.9*  --   --  9.4*  HCT  --   --  35.8*  --   --  30.4*  PLT  --   --  122*  --   --  125*  LABPROT  --  16.1* 16.2*  --   --  16.9*  INR  --  1.3* 1.3*  --   --  1.4*  HEPARINUNFRC  --   --  0.51  --  0.47 0.38  HEPRLOWMOCWT 1.30 0.67  --   --   --   --   CREATININE  --  2.32*  --  1.84*  --  1.74*     Medical History: Past Medical History:  Diagnosis Date  . Hypertension   . Morbid obesity ([place])   . Physical deconditioning   . Suspected sleep apnea    Assessment: 44 yof presented to the ED with afib and possible DVT. Originally started on enoxaparin- not on anticoagulation PTA.  Given elevated Scr in setting of obesity, patient was changed to heparin infusion. Warfarin was started on 3/10 -INR= 1.4, heparin level at goal   Goal of Therapy:  Heparin level: 0.3-0.7 units/mL Monitor platelets by anticoagulation protocol: Yes   Plan:  -warfarin 10mg  po x1 -daily INR -continue heparin infusion at 1150 units/hr  Hildred Laser, PharmD Clinical Pharmacist **Pharmacist phone directory can now be found on [email or web address] ([email or web address]).  Listed under [place].

## 2018-05-17 NOTE — Plan of Care (Signed)
  Problem: Education: Goal: Knowledge of General Education information will improve Description Including pain rating scale, medication(s)/side effects and non-pharmacologic comfort measures Outcome: Progressing   Problem: Health Behavior/Discharge Planning: Goal: Ability to manage health-related needs will improve Outcome: Progressing   Problem: Clinical Measurements: Goal: Ability to maintain clinical measurements within normal limits will improve Outcome: Progressing Goal: Diagnostic test results will improve Outcome: Progressing   Problem: Nutrition: Goal: Adequate nutrition will be maintained Outcome: Progressing   Problem: Coping: Goal: Level of anxiety will decrease Outcome: Progressing   Problem: Activity: Goal: Risk for activity intolerance will decrease Outcome: Not Progressing

## 2018-05-17 NOTE — Progress Notes (Addendum)
Shift event: Around 2225 hours, NP paged that pt was experiencing decreased sensation on the left side and her speech/reaction time was slowed. NP to bedside. RRRN in room. S: Pt denies pain or SOB. Says her left side "feels different". Denies numbness or tingling. No other c/o. Denies hx of CVA.  O: Fairly well appearing, chronically ill obese female in NAD. Lying in bed. Afebrile, HR 102, BP 93/60 and O2 sat 99% on [place]. She is alert and oriented. Her speech is not slurred, but she is slow to answer questions and has difficulty word finding. MOE x 4 spontaneously. Strength throughout is 5/5. No pronator drift. Decreased sensation to the left UE and left LE. Able to follow simple commands, but not 2 or 3 step.  A/P: 1. Mental status change with some aphasia, difficulty word finding and decreased sensation on left side. Stat CT head showed old atrophy only. Spoke to neuro who suggested lab test to r/o hypercarbic encephalopathy or metabolic encephalopathy. ABG was better than last one. PCO2 still in the 60s, but not as high. She has hypoventilation syndrome and likely lives this high. Still awaiting other labs. Because ABG was improved, ordered stat MRI brain w/out contrast due to creatinine clearance. Depending on results, will need a formal neuro consult.  2. Acute on chronic kidney disease-creat trending down and electrolytes are normal.  3. Afib with RVR-rate controlled. On Heparin drip to Coumadin bridge. No bleed on CT.  Talked to pt's RN a few times during shift and pt's mental status has not worsened. 640 am. MRI still pending. No further deterioration in mental status.  KJKG, NP [facility]

## 2018-05-17 NOTE — Progress Notes (Signed)
    Durable Medical Equipment  (From admission, onward)         Start     Ordered   05/17/18 1325  For home use only DME standard manual wheelchair with seat cushion  Once    Comments:  Bariatric.   Patient suffers from generalized weakness which impairs their ability to perform daily activities like bathing, dressing, feeding, grooming and toileting in the home.  A walker will not resolve issue with performing activities of daily living. A wheelchair will allow patient to safely perform daily activities. Patient can safely propel the wheelchair in the home or has a caregiver who can provide assistance.  Accessories: elevating leg rests (ELRs), wheel locks, extensions and anti-tippers.   05/17/18 1325   05/17/18 1324  For home use only DME Other see comment  Once    Comments:  Hoyer lift   05/17/18 1323   05/17/18 1307  For home use only DME 3 n 1  Once     05/17/18 1310   05/17/18 1306  For home use only DME Bedside commode  Once    Question:  Patient needs a bedside commode to treat with the following condition  Answer:  Weakness   05/17/18 1310   05/17/18 1306  For home use only DME Hospital bed  Once    Question:  Bed type  Answer:  Semi-electric   05/17/18 1310   05/17/18 1306  For home use only DME Air overlay mattress  Once     05/17/18 1310

## 2018-05-17 NOTE — Progress Notes (Signed)
Occupational Therapy Treatment Patient Details Name: Jean Stewart MRN: [id number] DOB: 01/11/1946 Today's Date: 05/17/2018    History of present illness Pt is a 73 y.o. female admitted 05/11/18 with SOB, pulmonary edema, afib with RVR and BLE edema. Also found to have skin breakdown in skin folds and renal insufficiency. PMH includes HTN, cardiomegaly, LE dermatitis, morbid obesity.   OT comments  Pt needing +3 assist for bed level mobility. Sat EOB x 10 minutes with supervision. Unable to stand with +4 assist and use of momentum from raised bed. Pt needing max assist to wash hands. Difficulty following commands. Daughter continues to plan to take pt home. Will need to maximize [facility] services and DME.   Follow Up Recommendations  SNF;Supervision/Assistance - 24 hour(family planning to take pt home)    Equipment Recommendations  3 in 1 bedside commode;Wheelchair (measurements OT);Wheelchair cushion (measurements OT);Hospital bed(hoyer lift, pressure relief mattress)    Recommendations for Other Services      Precautions / Restrictions Precautions Precautions: Fall Precaution Comments: purewick, IV Restrictions Weight Bearing Restrictions: No       Mobility Bed Mobility Overal bed mobility: Needs Assistance Bed Mobility: Supine to Sit;Sit to Supine     Supine to sit: Total assist;HOB elevated(+3) Sit to supine: Total assist(+3)   General bed mobility comments: TotalA+3 to sit EOB; maxA+2 to scoot hips forward at EOB. TotalA+3 for return to supine, repositioning and scooting up  Transfers Overall transfer level: Needs assistance               General transfer comment: Unable to achieve partial standing despite maxA+4 and use of bed pad, RW. Recommend maximove for future OOB transfers    Balance Overall balance assessment: Needs assistance Sitting-balance support: Feet supported;Bilateral upper extremity supported Sitting balance-Leahy Scale: Fair       Standing  balance-Leahy Scale: Zero Standing balance comment: unable to stand with 4 person assist x 2 attempts                           ADL either performed or assessed with clinical judgement   ADL Overall ADL's : Needs assistance/impaired Eating/Feeding: Set up;Bed level   Grooming: Wash/dry hands;Maximal assistance;Bed level               Lower Body Dressing: Total assistance;Bed level                       Vision       Perception     Praxis      Cognition Arousal/Alertness: Awake/alert Behavior During Therapy: Flat affect Overall Cognitive Status: History of cognitive impairments - at baseline                                 General Comments: Daughter reports baseline worsening memory deficits, although pt typically A&Ox4; daughter attributes to dementia. Today, pt able to state name and birthday; stating it's 2002 and she's in the nursing home. Slow to respond. Did not know what to do when handed moist wipe and told to wash hands before eating; stared at wipe blankly        Exercises     Shoulder Instructions       General Comments Daughter and son-in-law present throughout session; daughter involved and helping physically assist. Discussed their plan to have pt return home and equipment needs    Pertinent  Vitals/ Pain       Pain Assessment: Faces Faces Pain Scale: Hurts even more Pain Location: LLE with mobility Pain Descriptors / Indicators: Grimacing;Moaning Pain Intervention(s): Monitored during session;Repositioned  Home Living                                          Prior Functioning/Environment              Frequency  Min 2X/week        Progress Toward Goals  OT Goals(current goals can now be found in the care plan section)  Progress towards OT goals: Not progressing toward goals - comment(impaired cognition)  Acute Rehab OT Goals Patient Stated Goal: none stated OT Goal Formulation:  With patient/family Time For Goal Achievement: 05/25/18 Potential to Achieve Goals: [place] Discharge plan remains appropriate    Co-evaluation    PT/OT/SLP Co-Evaluation/Treatment: Yes Reason for Co-Treatment: For patient/therapist safety   OT goals addressed during session: Strengthening/ROM      AM-PAC OT "6 Clicks" Daily Activity     Outcome Measure   Help from another person eating meals?: A Little Help from another person taking care of personal grooming?: Total Help from another person toileting, which includes using toliet, bedpan, or urinal?: Total Help from another person bathing (including washing, rinsing, drying)?: Total Help from another person to put on and taking off regular upper body clothing?: Total Help from another person to put on and taking off regular lower body clothing?: Total 6 Click Score: 8    End of Session Equipment Utilized During Treatment: Gait belt;Oxygen  OT Visit Diagnosis: Unsteadiness on feet (R26.81);Muscle weakness (generalized) (M62.81)   Activity Tolerance Patient tolerated treatment well   Patient Left in bed;with call bell/phone within reach;with family/visitor present   Nurse Communication Mobility status        Time: 0630-1601 OT Time Calculation (min): 24 min  Charges: OT General Charges $OT Visit: 1 Visit OT Treatments $Therapeutic Activity: 8-22 mins  Nestor Lewandowsky, OTR/L Acute Rehabilitation Services Pager: [phone] Office: [phone]   Malka So 05/17/2018, 1:14 PM

## 2018-05-17 NOTE — Significant Event (Addendum)
Rapid Response Event Note  Overview: Neurologic - Expressive Aphasia and LT sensory loss  Initial Focused Assessment: Called by RN to assess patient for expressive aphasia, word finding difficulty, and neuro changes. Per RN, patient seems slow to respond, has had some expressive aphasia. When I arrived, patient was alert and oriented x 3, no slurred speech, is slow to respond with answers. No drift in any extremities but endorses decreased sensation in LUE and LLE. [facility] NP was paged and came to the bedside as well.  While we were with the patient, [facility] NP and myself were called away to a medical emergency. NP called the nurse back and ordered a STAT HEAD CT   Interventions: - HEAD CT STAT -- no acute abnormalities  Plan of Care: - Monitor neurologic status  - Upon review of the chart - ABG was done 0045, RN reported ABG to [facility] NP on call, CO2 is 61.2 (previous ABG CO2 is was in the 60s as well).   Event Summary:    at    Call Time [name] Time 2240  Jean Stewart R

## 2018-05-17 NOTE — Progress Notes (Signed)
Physical Therapy Treatment Patient Details Name: Jean Stewart MRN: [id number] DOB: 1946/01/13 Today's Date: 05/17/2018    History of Present Illness Pt is a 73 y.o. female admitted 05/11/18 with SOB, pulmonary edema, afib with RVR and BLE edema. Also found to have skin breakdown in skin folds and renal insufficiency. PMH includes HTN, cardiomegaly, LE dermatitis, morbid obesity.   PT Comments    Pt currently requiring maxA+3 to totalA for bed mobility and to come to sitting EOB; unable to stand despite assist of 3. Pt's daughter and son-in-law present throughout session; despite recommendations, family plans to have pt return home. Will require equipment listed below for home use; this includes a pressure relieving mattress on the hospital bed secondary to pt's current sores and high risk for pressure wounds. Recommend use of maximove for future OOB transfers.   Follow Up Recommendations  SNF(pt and family declined; will need max HH services)     Equipment Recommendations  Other (comment)(bariatric 3in1, bariatric wheelchair. hoyer lift, hospital bed w/ pressure relieving mattress)    Recommendations for Other Services       Precautions / Restrictions Precautions Precautions: Fall Precaution Comments: purewick, IV Restrictions Weight Bearing Restrictions: No    Mobility  Bed Mobility Overal bed mobility: Needs Assistance Bed Mobility: Supine to Sit;Sit to Supine     Supine to sit: Total assist;HOB elevated(+3) Sit to supine: Total assist(+3)   General bed mobility comments: TotalA+3 to sit EOB; maxA+2 to scoot hips forward at EOB. TotalA+3 for return to supine, repositioning and scooting up  Transfers Overall transfer level: Needs assistance               General transfer comment: Unable to achieve partial standing despite maxA+4 and use of bed pad, RW. Recommend maximove for future OOB transfers  Ambulation/Gait                 Stairs              Wheelchair Mobility    Modified Rankin (Stroke Patients Only)       Balance Overall balance assessment: Needs assistance Sitting-balance support: Feet supported;Bilateral upper extremity supported Sitting balance-Leahy Scale: Fair       Standing balance-Leahy Scale: Zero                              Cognition Arousal/Alertness: Awake/alert Behavior During Therapy: Flat affect Overall Cognitive Status: History of cognitive impairments - at baseline                                 General Comments: Daughter reports baseline worsening memory deficits, although pt typically A&Ox4; daughter attributes to dementia. Today, pt able to state name and birthday; stating it's 2002 and she's in the nursing home. Slow to respond. Did not know what to do when handed moist wipe and told to wash hands before eating; stared at wipe blankly      Exercises      General Comments General comments (skin integrity, edema, etc.): Daughter and son-in-law present throughout session; daughter involved and helping physically assist. Discussed their plan to have pt return home and equipment needs      Pertinent Vitals/Pain Pain Assessment: Faces Faces Pain Scale: Hurts even more Pain Location: LLE with mobility Pain Descriptors / Indicators: Grimacing;Moaning Pain Intervention(s): Limited activity within patient's tolerance;Repositioned    Home Living  Prior Function            PT Goals (current goals can now be found in the care plan section) Acute Rehab PT Goals Patient Stated Goal: none stated PT Goal Formulation: With patient Time For Goal Achievement: 05/28/18 Potential to Achieve Goals: Fair Progress towards PT goals: Progressing toward goals    Frequency    Min 2X/week      PT Plan Equipment recommendations need to be updated    Co-evaluation              AM-PAC PT "6 Clicks" Mobility   Outcome Measure   Help needed turning from your back to your side while in a flat bed without using bedrails?: Total Help needed moving from lying on your back to sitting on the side of a flat bed without using bedrails?: Total Help needed moving to and from a bed to a chair (including a wheelchair)?: Total Help needed standing up from a chair using your arms (e.g., wheelchair or bedside chair)?: Total Help needed to walk in hospital room?: Total Help needed climbing 3-5 steps with a railing? : Total 6 Click Score: 6    End of Session Equipment Utilized During Treatment: Gait belt Activity Tolerance: Patient limited by fatigue;Patient limited by pain Patient left: in bed;with call bell/phone within reach;with family/visitor present Nurse Communication: Mobility status;Need for lift equipment PT Visit Diagnosis: Muscle weakness (generalized) (M62.81);Difficulty in walking, not elsewhere classified (R26.2);Adult, failure to thrive (R62.7)     Time: 9191-6606 PT Time Calculation (min) (ACUTE ONLY): 30 min  Charges:  $Self Care/Home Management: [place], PT, DPT Acute Rehabilitation Services  Pager [phone] Office [phone]  Derry Lory 05/17/2018, 12:59 PM

## 2018-05-17 NOTE — Progress Notes (Signed)
PROGRESS NOTE    [name]  HDQ:[id number] DOB: December 03, 1945 DOA: 05/11/2018 PCP: Patient, No Pcp Per     Brief Narrative:  Jean Stewart is a 73 yo AAF with hx of hypertension and obesity who is a very poor historian. She presented to the ER with complaints of generalized weakness and significant LE swelling. She subsequently reported its been ongoing for over a year. Patient was found in the ER with A. fib with RVR, heart rate as high as 129, blood pressure is low normal 97/57. She was saturating 88% on room air and was put on 4 L of oxygen. Troponin 0.06. Creatinine 2.21, baseline unknown.A chest x-ray shows cardiomegaly. Twelve-lead EKG shows A. fib with RVR.  Patient was started on Cardizem infusion in the ER.  Eventually, cardizem transitioned to PO Toprol. PT recommended SNF.   New events last 24 hours / Subjective: Overnight events reviewed, patient apparently had an acute episode of neurological change.  She complained of decreased sensation of her left side, also noted to have aphasia and speech finding difficulties.  CT head was unremarkable for acute change.  ABG obtained which revealed PCO2 in the 60s, which appear to be her baseline.  Case discussed with neurology, MRI ordered.  This morning, patient is back to her normal baseline state.  She is oriented to self and place but not to year.  Discussed with daughter over the phone, they are refusing SNF placement.  Assessment & Plan:   Principal Problem:   Atrial fibrillation with RVR (HCC) Active Problems:   Obesity, Class III, BMI 40-49.9 (morbid obesity) (HCC)   Essential hypertension   Renal insufficiency   New onset a-fib (HCC)   Acute respiratory failure with hypoxia and hypercapnia (HCC)   Elevated troponin   Pressure injury of skin   Acute respiratory failure with hypoxia and hypercapnia -Likelymultifactorial withundiagnosed underlying sleep apnea, obesity hypoventialtion,congestive heart  failuresystolic 11% EF andnew onset A. Fib -Evaluation for PE showsnegativefor presence of DVT. VQ scan low probability -Weaned off BiPAP now on [place] O2    A. fib with RVR -Weaned offdilt gtt given her reduced EF, continue with Toprol XL  -Patients super morbid obesity makes NOAC a poor choice -IV heparin with bridge to coumadin  Acute combined systolic and diastolic CHF -EF 94-17%  -Transition to po lasix. 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 Will need sleep apnea evaluation as an outpatient   Aphasia, paresthesia of L -MRI brain pending    DVT prophylaxis: Heparin gtt and coumadin  Code Status: Full Family Communication: No family at bedside, discussed with daughter over the phone Disposition Plan: Family declining SNF placement, will plan for home health with maximal home health services as well as home DME equipment   Antimicrobials:  Anti-infectives (From admission, onward)   None       Objective: Vitals:   05/17/18 0458 05/17/18 0753 05/17/18 0815 05/17/18 1215  BP: 99/62 98/63 95/60  92/63  Pulse: 98 99 96 98  Resp:  (!) 22 16 16   Temp: 98.5 F (36.9 C) 97.9 F (36.6 C) 98.2 F (36.8 C) (!) 97.5 F (36.4 C)  TempSrc:  Oral Oral Axillary  SpO2: 98% 100% 95% 99%  Weight:      Height:  Intake/Output Summary (Last 24 hours) at 05/17/2018 1307 Last data filed at 05/17/2018 0900 Gross per 24 hour  Intake 234.63 ml  Output 1400 ml  Net -1165.37 ml   Filed Weights   05/14/18 0524 05/15/18 0442 05/16/18 0703  Weight: (!) 204.1 kg (!) 215.9 kg (!) 221.2 kg    Examination: General exam: Appears calm and comfortable  Respiratory system: Clear to auscultation. Respiratory effort normal.  Cardiovascular system: S1 & S2 heard, irregular rhythm, rate 80s Gastrointestinal system: Abdomen is nondistended, soft and nontender. No organomegaly or masses felt. Normal bowel sounds heard.  Obese Central nervous system: Alert and oriented to self and place. No focal neurological deficits. Extremities: Symmetric 5 x 5 power. Skin: No rashes, lesions or ulcers Psychiatry: Judgement and insight appear normal. Mood & affect appropriate.    Data Reviewed: I have personally reviewed following labs and imaging studies  CBC: Recent Labs  Lab 05/11/18 1325  05/12/18 0443 05/13/18 0508 05/14/18 0412 05/16/18 0500 05/17/18 0320  WBC 7.9  --  8.4 9.0 7.1 5.1 5.4  NEUTROABS 6.3  --   --  7.0 5.4  --   --   HGB 13.1   < > 12.9 12.1 11.5* 10.9* 9.4*  HCT 42.2   < > 41.8 39.4 38.4 35.8* 30.4*  MCV 101.4*  --  101.2* 101.8* 101.1* 101.1* 99.3  PLT 110*  --  105* 102* 108* 122* 125*   < > = values in this interval not displayed.   Basic Metabolic Panel: Recent Labs  Lab 05/13/18 0508 05/14/18 0412 05/15/18 0548 05/16/18 1122 05/17/18 0320  NA 139 137 135 136 136  K 4.7 4.6 4.6 4.5 4.5  CL 100 102 99 99 99  CO2 28 28 29  32 32  GLUCOSE 111* 112* 103* 116* 110*  BUN 47* 51* 54* 55* 58*  CREATININE 2.35* 2.49* 2.32* 1.84* 1.74*  CALCIUM 8.4* 8.3* 8.7* 9.0 8.6*   GFR: Estimated Creatinine Clearance: 54.3 mL/min (A) (by C-G formula based on SCr of 1.74 mg/dL (H)). Liver Function Tests: Recent Labs  Lab 05/11/18 1325  AST 46*  ALT 30  ALKPHOS 66  BILITOT 1.2  PROT 6.6  ALBUMIN 3.4*   No results for input(s): LIPASE, AMYLASE in the last 168 hours. No results for input(s): AMMONIA in the last 168 hours. Coagulation Profile: Recent Labs  Lab 05/15/18 0548 05/16/18 0500 05/17/18 0320  INR 1.3* 1.3* 1.4*   Cardiac Enzymes: Recent Labs  Lab 05/11/18 1325 05/11/18 1943 05/11/18 2257 05/12/18 0443  TROPONINI 0.06* 0.06* 0.07* 0.07*   BNP (last 3 results) No results  for input(s): PROBNP in the last 8760 hours. HbA1C: No results for input(s): HGBA1C in the last 72 hours. CBG: Recent Labs  Lab 05/16/18 2232  GLUCAP 108*   Lipid Profile: No results for input(s): CHOL, HDL, LDLCALC, TRIG, CHOLHDL, LDLDIRECT in the last 72 hours. Thyroid Function Tests: No results for input(s): TSH, T4TOTAL, FREET4, T3FREE, THYROIDAB in the last 72 hours. Anemia Panel: No results for input(s): VITAMINB12, FOLATE, FERRITIN, TIBC, IRON, RETICCTPCT in the last 72 hours. Sepsis Labs: No results for input(s): PROCALCITON, LATICACIDVEN in the last 168 hours.  No results found for this or any previous visit (from the past 240 hour(s)).     Radiology Studies: Ct Head Wo Contrast  Result Date: 05/17/2018 CLINICAL DATA:  Altered sensation on the left side. Slow to speak and follow commands. Hypertension. EXAM: CT HEAD WITHOUT CONTRAST TECHNIQUE: Contiguous axial images were obtained from  the base of the skull through the vertex without intravenous contrast. COMPARISON:  None. FINDINGS: Brain: No evidence of acute infarction, hemorrhage, hydrocephalus, extra-axial collection or mass lesion/mass effect. Diffuse cerebral atrophy. Low-attenuation changes in the deep white matter consistent with central atrophy. Vascular: Mild intracranial arterial vascular calcifications. Skull: Calvarium appears intact. No acute depressed skull fractures. Sinuses/Orbits: Paranasal sinuses are clear. Partial opacification of mastoid air cells bilaterally. Other: None. IMPRESSION: No acute intracranial abnormalities. Chronic atrophy and small vessel ischemic changes. Bilateral partial mastoid effusions. Electronically Signed   By: Lucienne Capers M.D.   On: 05/17/2018 00:10      Scheduled Meds: . aspirin EC  81 mg Oral Daily  . feeding supplement (PRO-STAT SUGAR FREE 64)  30 mL Oral BID  . furosemide  40 mg Oral BID  . metoprolol succinate  100 mg Oral Daily  . nystatin cream   Topical BID  .  sodium chloride flush  10-40 mL Intracatheter Q12H  . warfarin  10 mg Oral ONCE-1800  . Warfarin - Pharmacist Dosing Inpatient   Does not apply q1800   Continuous Infusions: . heparin 1,150 Units/hr (05/17/18 1207)     LOS: 6 days    Time spent: 40 minutes   Dessa Phi, DO [facility] [email or web address] 05/17/2018, 1:07 PM

## 2018-05-18 LAB — BASIC METABOLIC PANEL
Anion gap: 6 (ref 5–15)
BUN: 54 mg/dL — ABNORMAL HIGH (ref 8–23)
CALCIUM: 8.7 mg/dL — AB (ref 8.9–10.3)
CO2: 32 mmol/L (ref 22–32)
Chloride: 98 mmol/L (ref 98–111)
Creatinine, Ser: 1.51 mg/dL — ABNORMAL HIGH (ref 0.44–1.00)
GFR calc Af Amer: 39 mL/min — ABNORMAL LOW (ref >60)
GFR calc non Af Amer: 34 mL/min — ABNORMAL LOW (ref >60)
Glucose, Bld: 110 mg/dL — ABNORMAL HIGH (ref 70–99)
Potassium: 4.4 mmol/L (ref 3.5–5.1)
Sodium: 136 mmol/L (ref 135–145)

## 2018-05-18 LAB — CBC
HCT: 29.1 % — ABNORMAL LOW (ref 36.0–46.0)
Hemoglobin: 8.8 g/dL — ABNORMAL LOW (ref 12.0–15.0)
MCH: 30.1 pg (ref 26.0–34.0)
MCHC: 30.2 g/dL (ref 30.0–36.0)
MCV: 99.7 fL (ref 80.0–100.0)
Platelets: 127 10*3/uL — ABNORMAL LOW (ref 150–400)
RBC: 2.92 MIL/uL — ABNORMAL LOW (ref 3.87–5.11)
RDW: 14 % (ref 11.5–15.5)
WBC: 5.2 10*3/uL (ref 4.0–10.5)
nRBC: 0 % (ref 0.0–0.2)

## 2018-05-18 LAB — HEPARIN LEVEL (UNFRACTIONATED): Heparin Unfractionated: 0.28 IU/mL — ABNORMAL LOW (ref 0.30–0.70)

## 2018-05-18 LAB — PROTIME-INR
INR: 1.6 — ABNORMAL HIGH (ref 0.8–1.2)
PROTHROMBIN TIME: 18.9 s — AB (ref 11.4–15.2)

## 2018-05-18 LAB — HEPARIN ANTI-XA: Heparin LMW: 0.36 IU/mL

## 2018-05-18 MED ORDER — FUROSEMIDE 40 MG PO TABS
40.0000 mg | ORAL_TABLET | Freq: Two times a day (BID) | ORAL | Status: DC
  Administered 2018-05-19: 40 mg via ORAL
  Filled 2018-05-18: qty 1

## 2018-05-18 MED ORDER — SODIUM CHLORIDE 0.9 % IV SOLN
INTRAVENOUS | Status: AC
  Administered 2018-05-18: 12:00:00 via INTRAVENOUS

## 2018-05-18 MED ORDER — SODIUM CHLORIDE 0.9 % IV SOLN: INTRAVENOUS | Status: DC

## 2018-05-18 MED ORDER — WARFARIN SODIUM 7.5 MG PO TABS
7.5000 mg | ORAL_TABLET | Freq: Once | ORAL | Status: AC
  Administered 2018-05-18: 7.5 mg via ORAL
  Filled 2018-05-18: qty 1

## 2018-05-18 MED ORDER — HEPARIN (PORCINE) 25000 UT/250ML-% IV SOLN
1350.0000 [IU]/h | INTRAVENOUS | Status: DC
  Administered 2018-05-18: 1350 [IU]/h via INTRAVENOUS
  Filled 2018-05-18: qty 250

## 2018-05-18 MED ORDER — ENOXAPARIN SODIUM 100 MG/ML ~~LOC~~ SOLN
100.0000 mg | Freq: Once | SUBCUTANEOUS | Status: AC
  Administered 2018-05-18: 100 mg via SUBCUTANEOUS
  Filled 2018-05-18: qty 1

## 2018-05-18 NOTE — Progress Notes (Signed)
[place] for Heparin/warfarin  Indication: Afib  Patient Measurements: Height: 5\' 4"  (162.6 cm) Weight: (!) 487 lb 9.6 oz (221.2 kg) IBW/kg (Calculated) : 54.7  Heparin dose weight: 114.5 kg   Vital Signs: Temp: 97.7 F (36.5 C) (03/13 1200) BP: 92/55 (03/13 1200) Pulse Rate: 105 (03/13 1200)  Labs: Recent Labs    05/16/18 0500 05/16/18 1122 05/16/18 1305 05/17/18 0320 05/18/18 0402 05/18/18 1300  HGB 10.9*  --   --  9.4* 8.8*  --   HCT 35.8*  --   --  30.4* 29.1*  --   PLT 122*  --   --  125* 127*  --   LABPROT 16.2*  --   --  16.9* 18.9*  --   INR 1.3*  --   --  1.4* 1.6*  --   HEPARINUNFRC 0.51  --  0.47 0.38 0.28*  --   HEPRLOWMOCWT  --   --   --   --   --  0.36  CREATININE  --  1.84*  --  1.74* 1.51*  --      Medical History: Past Medical History:  Diagnosis Date  . Hypertension   . Morbid obesity ([place])   . Physical deconditioning   . Suspected sleep apnea    Assessment: 61 yof presented to the ED with afib and possible DVT. Originally started on enoxaparin- not on anticoagulation PTA.  Given elevated Scr in setting of obesity, patient was changed to heparin infusion. Warfarin was started on 3/10. Pharmacy consulted to change back to lovenox 3/13 -INR= 1.6  Lovenox started 200mg  [place] q12h on 3/6 and dose was adjusted based on anti-Xa levels. Renal function and obesity were significant factors. -the last dose of lovenox 110mg  given around midnight on 3/8.  -LMWH levels: 1.3 (3/9)>>0.67 (3/10).  -SCr= 1.51 (on 3/9 was 2.49)  Lovenox level is 0.36. Spoke to Dr. Maylene Roes and due to complications with lovenox dosing will change back to heparin -last heparin level was 0.28 on 1150 units/hr   Goal of Therapy:  Heparin level: 0.3-0.7 units/mL Monitor platelets by anticoagulation protocol: Yes   Plan:  -Restart heparin at 1350 units/hr -Heparin level in 8 hours and daily wth CBC daily    Hildred Laser, PharmD Clinical  Pharmacist **Pharmacist phone directory can now be found on [email or web address] ([email or web address]).  Listed under [place].

## 2018-05-18 NOTE — Progress Notes (Signed)
[place] for Heparin/warfarin  Indication: Afib  Patient Measurements: Height: 5\' 4"  (162.6 cm) Weight: (!) 487 lb 9.6 oz (221.2 kg) IBW/kg (Calculated) : 54.7  Heparin dose weight: 114.5 kg   Vital Signs: Temp: 97.5 F (36.4 C) (03/13 0412) BP: 88/50 (03/13 0835) Pulse Rate: 100 (03/13 0412)  Labs: Recent Labs    05/16/18 0500 05/16/18 1122 05/16/18 1305 05/17/18 0320 05/18/18 0402  HGB 10.9*  --   --  9.4* 8.8*  HCT 35.8*  --   --  30.4* 29.1*  PLT 122*  --   --  125* 127*  LABPROT 16.2*  --   --  16.9* 18.9*  INR 1.3*  --   --  1.4* 1.6*  HEPARINUNFRC 0.51  --  0.47 0.38 0.28*  CREATININE  --  1.84*  --  1.74* 1.51*     Medical History: Past Medical History:  Diagnosis Date  . Hypertension   . Morbid obesity ([place])   . Physical deconditioning   . Suspected sleep apnea    Assessment: 65 yof presented to the ED with afib and possible DVT. Originally started on enoxaparin- not on anticoagulation PTA.  Given elevated Scr in setting of obesity, patient was changed to heparin infusion. Warfarin was started on 3/10. Pharmacy consulted to change back to lovenox 3/13 -INR= 1.6  Lovenox started 200mg  [place] q12h on 3/6 and dose was adjusted based on anti-Xa levels. Renal function and obesity were significant factors. -the last dose of lovenox 110mg  given around midnight on 3/8.  -LMWH levels: 1.3 (3/9)>>0.67 (3/10).  -SCr= 1.51 (on 3/9 was 2.49)   Goal of Therapy:  Heparin level: 0.3-0.7 units/mL Monitor platelets by anticoagulation protocol: Yes   Plan:  -warfarin 7.5mg  po x1 -daily INR -discontinue heparin -Will be complicated to determine the appropriate lovenox dose -lovenox 100mg  [place] x1 -Check an anti-Xa level in 4hrs  Hildred Laser, PharmD Clinical Pharmacist **Pharmacist phone directory can now be found on [email or web address] ([email or web address]).  Listed under [place].

## 2018-05-18 NOTE — Care Management (Signed)
1130 05-18-18 Patient suffers from Acute respiratory failure with hypoxia and hypercapnia. Pt will benefit from Hospital Bed to elevate head of Bed to 30 degrees or greater and to assist with repositioning for the patient. Bethena Roys, RN,BSN Case Manager [phone]

## 2018-05-18 NOTE — Progress Notes (Signed)
PROGRESS NOTE    [name]  ZMO:[id number] DOB: 11-12-1945 DOA: 05/11/2018 PCP: Patient, No Pcp Per     Brief Narrative:  Jean Stewart is a 73 yo AAF with hx of hypertension and obesity who is a very poor historian. She presented to the ER with complaints of generalized weakness and significant LE swelling. She subsequently reported its been ongoing for over a year. Patient was found in the ER with A. fib with RVR, heart rate as high as 129, blood pressure is low normal 97/57. She was saturating 88% on room air and was put on 4 L of oxygen. Troponin 0.06. Creatinine 2.21, baseline unknown.A chest x-ray shows cardiomegaly. Twelve-lead EKG shows A. fib with RVR.  Patient was started on Cardizem infusion in the ER.  Eventually, cardizem transitioned to PO Toprol. PT recommended SNF, which family has declined.   New events last 24 hours / Subjective: Patient's blood pressure has been low this morning SBP in the 80s.  Patient has no acute complaints, states that she feels well.  Assessment & Plan:   Principal Problem:   Atrial fibrillation with RVR (HCC) Active Problems:   Obesity, Class III, BMI 40-49.9 (morbid obesity) (HCC)   Essential hypertension   Renal insufficiency   New onset a-fib (HCC)   Acute respiratory failure with hypoxia and hypercapnia (HCC)   Elevated troponin   Pressure injury of skin   Acute respiratory failure with hypoxia and hypercapnia -Likelymultifactorial withundiagnosed underlying sleep apnea, obesity hypoventialtion,congestive heart failuresystolic 03% EF andnew onset A. Fib -Evaluation for PE showsnegativefor presence of DVT. VQ scan low probability -Weaned off BiPAP now on [place] O2   -Home O2 screen to see if she qualifies for home O2   A. fib with RVR -Weaned offdilt gtt given her reduced EF, continue with Toprol XL  -Patients super morbid obesity makes NOAC a poor choice -Discussed with pharmacy on transitioning her to  Lovenox/Coumadin for impending discharge home  Acute combined systolic and diastolic CHF -EF 54-65%  -If BP allows could consider addition of nitrates/hydralazine -Not a candidate for ACEi,ARB,Aldactone due to CKD -Stop Lasix today due to low blood pressure, give small amount of IV fluid back, 500 mL over the next 10 hours.  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 Will need sleep apnea evaluation as an outpatient   Aphasia, paresthesia of L -MRI brain unable to be completed due to patient's size.  Patient now back to her baseline   DVT prophylaxis: Lovenox and coumadin  Code Status: Full Family Communication: No family at bedside, discussed with daughter over the phone Disposition Plan: Family declining SNF placement, will plan for home health with maximal home health services as well as home DME equipment, hopefully discharge 3/14   Antimicrobials:  Anti-infectives (From admission, onward)   None       Objective: Vitals:   05/18/18 0412 05/18/18 0835 05/18/18 0929 05/18/18 1200  BP: (!) 88/64 (!) 88/50 (!) 83/69 (!) 92/55  Pulse: 100 91  (!) 105  Resp: 18 16 19 16   Temp: (!) 97.5 F (36.4 C)   97.7 F (36.5 C)  TempSrc:      SpO2: 99% 99%  99%  Weight:      Height:        Intake/Output Summary (Last 24 hours) at 05/18/2018 1246 Last data filed at 05/18/2018 0540  Gross per 24 hour  Intake 564.68 ml  Output 600 ml  Net -35.32 ml   Filed Weights   05/14/18 0524 05/15/18 0442 05/16/18 0703  Weight: (!) 204.1 kg (!) 215.9 kg (!) 221.2 kg    Examination: General exam: Appears calm and comfortable  Respiratory system: Clear to auscultation. Respiratory effort normal. Cardiovascular system: S1 & S2 heard, irregular rhythm, rate 80. No JVD, murmurs, rubs, gallops or clicks. No pedal edema. Gastrointestinal system: Abdomen is  nondistended, soft and nontender. No organomegaly or masses felt. Normal bowel sounds heard. Central nervous system: Alert and oriented. No focal neurological deficits. Extremities: Symmetric 5 x 5 power. Skin: No rashes, lesions or ulcers Psychiatry: Judgement and insight appear normal. Mood & affect appropriate.   Data Reviewed: I have personally reviewed following labs and imaging studies  CBC: Recent Labs  Lab 05/11/18 1325  05/13/18 0508 05/14/18 0412 05/16/18 0500 05/17/18 0320 05/18/18 0402  WBC 7.9   < > 9.0 7.1 5.1 5.4 5.2  NEUTROABS 6.3  --  7.0 5.4  --   --   --   HGB 13.1   < > 12.1 11.5* 10.9* 9.4* 8.8*  HCT 42.2   < > 39.4 38.4 35.8* 30.4* 29.1*  MCV 101.4*   < > 101.8* 101.1* 101.1* 99.3 99.7  PLT 110*   < > 102* 108* 122* 125* 127*   < > = values in this interval not displayed.   Basic Metabolic Panel: Recent Labs  Lab 05/14/18 0412 05/15/18 0548 05/16/18 1122 05/17/18 0320 05/18/18 0402  NA 137 135 136 136 136  K 4.6 4.6 4.5 4.5 4.4  CL 102 99 99 99 98  CO2 28 29 32 32 32  GLUCOSE 112* 103* 116* 110* 110*  BUN 51* 54* 55* 58* 54*  CREATININE 2.49* 2.32* 1.84* 1.74* 1.51*  CALCIUM 8.3* 8.7* 9.0 8.6* 8.7*   GFR: Estimated Creatinine Clearance: 62.6 mL/min (A) (by C-G formula based on SCr of 1.51 mg/dL (H)). Liver Function Tests: Recent Labs  Lab 05/11/18 1325  AST 46*  ALT 30  ALKPHOS 66  BILITOT 1.2  PROT 6.6  ALBUMIN 3.4*   No results for input(s): LIPASE, AMYLASE in the last 168 hours. No results for input(s): AMMONIA in the last 168 hours. Coagulation Profile: Recent Labs  Lab 05/15/18 0548 05/16/18 0500 05/17/18 0320 05/18/18 0402  INR 1.3* 1.3* 1.4* 1.6*   Cardiac Enzymes: Recent Labs  Lab 05/11/18 1325 05/11/18 1943 05/11/18 2257 05/12/18 0443  TROPONINI 0.06* 0.06* 0.07* 0.07*   BNP (last 3 results) No results for input(s): PROBNP in the last 8760 hours. HbA1C: No results for input(s): HGBA1C in the last 72 hours.  CBG: Recent Labs  Lab 05/16/18 2232  GLUCAP 108*   Lipid Profile: No results for input(s): CHOL, HDL, LDLCALC, TRIG, CHOLHDL, LDLDIRECT in the last 72 hours. Thyroid Function Tests: No results for input(s): TSH, T4TOTAL, FREET4, T3FREE, THYROIDAB in the last 72 hours. Anemia Panel: No results for input(s): VITAMINB12, FOLATE, FERRITIN, TIBC, IRON, RETICCTPCT in the last 72 hours. Sepsis Labs: No results for input(s): PROCALCITON, LATICACIDVEN in the last 168 hours.  No results found for this or any previous visit (from the past 240 hour(s)).     Radiology Studies: Ct Head Wo Contrast  Result Date: 05/17/2018 CLINICAL DATA:  Altered sensation on the left side. Slow to speak and follow commands. Hypertension. EXAM: CT HEAD WITHOUT CONTRAST TECHNIQUE: Contiguous axial images were obtained from the base of the skull  through the vertex without intravenous contrast. COMPARISON:  None. FINDINGS: Brain: No evidence of acute infarction, hemorrhage, hydrocephalus, extra-axial collection or mass lesion/mass effect. Diffuse cerebral atrophy. Low-attenuation changes in the deep white matter consistent with central atrophy. Vascular: Mild intracranial arterial vascular calcifications. Skull: Calvarium appears intact. No acute depressed skull fractures. Sinuses/Orbits: Paranasal sinuses are clear. Partial opacification of mastoid air cells bilaterally. Other: None. IMPRESSION: No acute intracranial abnormalities. Chronic atrophy and small vessel ischemic changes. Bilateral partial mastoid effusions. Electronically Signed   By: Lucienne Capers M.D.   On: 05/17/2018 00:10      Scheduled Meds: . aspirin EC  81 mg Oral Daily  . feeding supplement (PRO-STAT SUGAR FREE 64)  30 mL Oral BID  . [START ON 05/19/2018] furosemide  40 mg Oral BID  . metoprolol succinate  100 mg Oral Daily  . nystatin cream   Topical BID  . sodium chloride flush  10-40 mL Intracatheter Q12H  . warfarin  7.5 mg Oral ONCE-1800   . Warfarin - Pharmacist Dosing Inpatient   Does not apply q1800   Continuous Infusions: . sodium chloride 50 mL/hr at 05/18/18 1146     LOS: 7 days    Time spent: 25 minutes   Dessa Phi, DO [facility] [email or web address] 05/18/2018, 12:46 PM

## 2018-05-18 NOTE — Care Management Important Message (Signed)
Important Message  Patient Details  Name: Jean Stewart MRN: [id number] Date of Birth: 12/18/1945   Medicare Important Message Given:  Yes    [name] 05/18/2018, 3:03 PM

## 2018-05-18 NOTE — Care Management (Signed)
    Durable Medical Equipment  (From admission, onward)         Start     Ordered   05/18/18 0950  For home use only DME Hospital bed  Once    Comments:  Heavy duty bariatric bed  Question Answer Comment  Bed type Heavy-duty, semi-electric (for patients >350 lbs.)   [employer] Yes   Support Surface: Gel Overlay      05/18/18 0952   05/17/18 1325  For home use only DME standard manual wheelchair with seat cushion  Once    Comments:  Bariatric.   Patient suffers from generalized weakness which impairs their ability to perform daily activities like bathing, dressing, feeding, grooming and toileting in the home.  A walker will not resolve issue with performing activities of daily living. A wheelchair will allow patient to safely perform daily activities. Patient can safely propel the wheelchair in the home or has a caregiver who can provide assistance.  Accessories: elevating leg rests (ELRs), wheel locks, extensions and anti-tippers.   05/17/18 1325   05/17/18 1324  For home use only DME Other see comment  Once    Comments:  Hoyer lift   05/17/18 1323   05/17/18 1307  For home use only DME 3 n 1  Once     05/17/18 1310   05/17/18 1306  For home use only DME Bedside commode  Once    Question:  Patient needs a bedside commode to treat with the following condition  Answer:  Weakness   05/17/18 1310   05/17/18 1306  For home use only DME Air overlay mattress  Once     05/17/18 1310

## 2018-05-19 LAB — CBC
HEMATOCRIT: 25.9 % — AB (ref 36.0–46.0)
Hemoglobin: 7.9 g/dL — ABNORMAL LOW (ref 12.0–15.0)
MCH: 30.2 pg (ref 26.0–34.0)
MCHC: 30.5 g/dL (ref 30.0–36.0)
MCV: 98.9 fL (ref 80.0–100.0)
Platelets: 143 10*3/uL — ABNORMAL LOW (ref 150–400)
RBC: 2.62 MIL/uL — ABNORMAL LOW (ref 3.87–5.11)
RDW: 14 % (ref 11.5–15.5)
WBC: 5.3 10*3/uL (ref 4.0–10.5)
nRBC: 0 % (ref 0.0–0.2)

## 2018-05-19 LAB — PROTIME-INR
INR: 2.4 — ABNORMAL HIGH (ref 0.8–1.2)
Prothrombin Time: 25.5 seconds — ABNORMAL HIGH (ref 11.4–15.2)

## 2018-05-19 LAB — BASIC METABOLIC PANEL
ANION GAP: 6 (ref 5–15)
BUN: 50 mg/dL — ABNORMAL HIGH (ref 8–23)
CO2: 32 mmol/L (ref 22–32)
Calcium: 8.5 mg/dL — ABNORMAL LOW (ref 8.9–10.3)
Chloride: 99 mmol/L (ref 98–111)
Creatinine, Ser: 1.42 mg/dL — ABNORMAL HIGH (ref 0.44–1.00)
GFR calc Af Amer: 42 mL/min — ABNORMAL LOW (ref >60)
GFR calc non Af Amer: 36 mL/min — ABNORMAL LOW (ref >60)
Glucose, Bld: 110 mg/dL — ABNORMAL HIGH (ref 70–99)
POTASSIUM: 4.6 mmol/L (ref 3.5–5.1)
Sodium: 137 mmol/L (ref 135–145)

## 2018-05-19 LAB — HEPARIN LEVEL (UNFRACTIONATED): Heparin Unfractionated: 0.71 IU/mL — ABNORMAL HIGH (ref 0.30–0.70)

## 2018-05-19 MED ORDER — WARFARIN SODIUM 5 MG PO TABS: 5.0000 mg | ORAL_TABLET | Freq: Once | ORAL | Status: DC

## 2018-05-19 MED ORDER — FUROSEMIDE 40 MG PO TABS: 40.0000 mg | ORAL_TABLET | Freq: Two times a day (BID) | ORAL | 60 refills | Status: DC

## 2018-05-19 MED ORDER — METOPROLOL SUCCINATE ER 100 MG PO TB24: 100.0000 mg | ORAL_TABLET | Freq: Every day | ORAL | 0 refills | Status: DC

## 2018-05-19 MED ORDER — WARFARIN SODIUM 5 MG PO TABS: 5.0000 mg | ORAL_TABLET | Freq: Every day | ORAL | 0 refills | Status: DC

## 2018-05-19 NOTE — Progress Notes (Signed)
SATURATION QUALIFICATIONS: (This note is used to comply with regulatory documentation for home oxygen)  Patient Saturations on Room Air at Rest = 83%  Patient Saturations on 3 Liters of oxygen 95%  Please briefly explain why patient needs home oxygen: Pt unable to maintain 02 sat above 83% on room air.  Requires 3 liters at rest.

## 2018-05-19 NOTE — TOC Transition Note (Addendum)
Transition of Care [facility] - CM/SW Discharge Note   Patient Details  Name: Jean Stewart MRN: [id number] Date of Birth: 05-13-1945  [facility] CM/SW Contact:  Royston Bake, RN,MHA,BSN Phone Number:[phone] 05/19/2018, 11:20 AM   Clinical Narrative:    11:24 am - Patient need ambulance transportation home; she plans to stay with her daughter at discharge; address verified [place], [place] [address] Daughter cell # [phone] Patient also need home oxygen at discharge, arranged with [facility]; daughter is at the bedside, a portable oxygen concentrator will be delivered to the room for family to take home. No other needs identified at this time. CM signing off.  12:53 pm - Non emergent ambulance called for transportation home; Daughter has oxygen concentrator for home; B Pennie Rushing   Final next level of care: [place] Barriers to Discharge: No Barriers Identified(Daughter opted for Home with [facility]. )   Patient Goals and CMS Choice   CMS Medicare.gov Compare Post Acute Care list provided to:: Other (Comment Required)(daughter) Choice offered to / list presented to : Adult Children  Discharge Placement        Home with [facility] services               Discharge Plan and Services Discharge Planning Services: CM Consult Post Acute Care Choice: Durable Medical Equipment          DME Arranged: Other see comment(Hoyer lift. ) DME Agency: [facility] HH Arranged: RN, Disease Management, PT, OT, Nurse's Aide, Refused SNF, Social Work [employer] Agency: [facility]   Social Determinants of Health (SDOH) Interventions     Readmission Risk Interventions No flowsheet data found.

## 2018-05-19 NOTE — Discharge Summary (Addendum)
Physician Discharge Summary  [name] NLG:[id number] DOB: 01-26-1946 DOA: 05/11/2018  PCP: Patient, No Pcp Per  Admit date: 05/11/2018 Discharge date: 05/19/2018  Admitted From: Home Disposition: Home, family declined SNF placement   Recommendations for Outpatient Follow-up:  1. Follow up with PCP in 1 week 2. Follow up with Cardiology Dr. Meda Coffee in 4-6 weeks  3. Need INR follow up early next week  4. Referral for A fib clinic ordered at time of discharge  5. Recommend outpatient sleep study to evaluate for OSA  Home Health: PT OT RN Aide  Equipment/Devices: Hospital bed, bedside commode, wheelchair, hoyer lift, specialty mattress, oxygen   Discharge Condition: Stable CODE STATUS: Full  Diet recommendation: Heart healthy   Brief/Interim Summary: Jean Stewart is a 73 yo AAF with hx of hypertension and obesity who is a very poor historian. She presented to the ER with complaints of generalized weakness and significant LE swelling. She subsequently reported its been ongoing for over a year. Patient was found in the ER with A. fib with RVR, heart rate as high as 129, blood pressure is low normal 97/57. She was saturating 88% on room air and was put on 4 L of oxygen. Troponin 0.06. Creatinine 2.21, baseline unknown.A chest x-ray shows cardiomegaly. Twelve-lead EKG shows A. fib with RVR.  Patient was started on Cardizem infusion in the ER.  Cardiology was consulted. Eventually, cardizem transitioned to PO Toprol, heart rate remained controlled.  She was also diuresed with IV Lasix which was transitioned to p.o. PT recommended SNF, which family has declined.   Discharge Diagnoses:  Principal Problem:   Atrial fibrillation with RVR ([place]) Active Problems:   Obesity, Class III, BMI 40-49.9 (morbid obesity) (HCC)   Essential hypertension   Renal insufficiency   New onset a-fib (HCC)   Acute respiratory failure with hypoxia and hypercapnia (HCC)   Elevated troponin   Pressure  injury of skin   Acute on chronic respiratory failure with hypoxia and hypercapnia -Likelymultifactorial withundiagnosed underlying sleep apnea, obesity hypoventialtion,congestive heart failuresystolic 08% EF andnew onset A. Fib -Evaluation for PE showsnegativefor presence of DVT. VQ scan low probability -Weaned off BiPAP now on [place] O2   -Qualifies for home O2, 3 L  A. 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 Will need sleep apnea evaluation as an outpatient   Aphasia, paresthesia of L -CT head negative for acute abnormality.  MRI brain unable to be completed due to patient's size.  Patient now back to her baseline   Discharge Instructions  Discharge Instructions    (HEART FAILURE PATIENTS) Call MD:  Anytime you have any of the following symptoms: 1) 3 pound weight gain in 24 hours or 5 pounds in 1 week 2) shortness of breath, with or without a dry hacking cough 3) swelling in the hands, feet or stomach 4) if you have to sleep on extra pillows at night in order to breathe.   Complete by:  As directed    Amb Referral to AFIB Clinic   Complete by:  As directed    Call MD for:  difficulty breathing, headache or visual disturbances   Complete by:  As directed    Call MD for:  extreme fatigue   Complete by:  As directed    Call MD for:  persistant dizziness or light-headedness   Complete by:  As directed    Diet - low sodium heart healthy   Complete by:  As directed     Discharge instructions   Complete by:  As directed    You were cared for by a hospitalist during your hospital stay. If you have any questions about your discharge medications or the care you received while you were in the hospital after you are discharged, you can call the unit and ask to speak with the hospitalist on call if the hospitalist that took care of you is not available. Once you are discharged, your primary care physician will handle any further medical issues. Please note that NO REFILLS for any discharge medications will be authorized once you are discharged, as it is imperative that you return to your primary care physician (or establish a relationship with a primary care physician if you do not have one) for your aftercare needs so that they can reassess your need for medications and monitor your lab values.   Increase activity slowly   Complete by:  As directed      Allergies as of 05/19/2018   Not on File     Medication List    STOP taking these medications   amLODipine 5 MG tablet Commonly known as:  NORVASC     TAKE these medications   acetaminophen 500 MG tablet Commonly known as:  TYLENOL Take 1,000 mg by mouth every 6 (six) hours as needed for mild pain.   docusate sodium 100 MG capsule Commonly known as:  COLACE Take 200 mg by mouth daily as needed for mild constipation.   furosemide 40 MG tablet Commonly known as:  LASIX Take 1 tablet (40 mg total) by mouth 2 (two) times daily. Notes to patient:  Tomorrow morning 05/20/18   hydrocortisone 2.5 % cream Apply 1 application topically 2 (two) times daily as needed for itching.   metoprolol succinate 100 MG 24 hr tablet Commonly known as:  TOPROL-XL Take 1 tablet (100 mg total) by mouth daily. Take with or immediately following a meal. Start taking on:  May 20, 2018   warfarin 5 MG tablet Commonly known as:  COUMADIN Take 1 tablet (5 mg total) by mouth daily.            Durable Medical Equipment   (From admission, onward)         Start     Ordered   05/19/18 1052  For home use only DME oxygen  Once    Question Answer Comment  Mode or (Route) Nasal cannula   Liters per Minute 3   Frequency Continuous (stationary and portable oxygen unit needed)   Oxygen delivery system Gas      05/19/18 1051   05/18/18 0950  For home use only DME Hospital bed  Once    Comments:  Heavy duty bariatric bed  Question Answer Comment  Bed type Heavy-duty, semi-electric (for patients >350 lbs.)   [employer] Yes   Support Surface: Gel Overlay      05/18/18 0952   05/17/18 1325  For home use only DME standard manual wheelchair with seat cushion  Once    Comments:  Bariatric.  Patient suffers from generalized weakness which impairs their ability to perform daily activities like bathing, dressing, feeding, grooming and toileting in the home.  A walker will not resolve issue with performing activities of daily living. A wheelchair will allow patient to safely perform daily activities. Patient can safely propel the wheelchair in the home or has a caregiver who can provide assistance.  Accessories: elevating leg rests (ELRs), wheel locks, extensions and anti-tippers.   05/17/18 1325   05/17/18 1324  For home use only DME Other see comment  Once    Comments:  Hoyer lift   05/17/18 1323   05/17/18 1307  For home use only DME 3 n 1  Once     05/17/18 1310   05/17/18 1306  For home use only DME Bedside commode  Once    Question:  Patient needs a bedside commode to treat with the following condition  Answer:  Weakness   05/17/18 1310   05/17/18 1306  For home use only DME Air overlay mattress  Once     05/17/18 1310         [facility] Follow up.   Specialty:  Home Health Services Why:  Registered Nurse, Physical Therapy, Occupational Therapy, Aide, Education officer, museum.  Contact information: [place]  [phone]        [facility] Follow up.   Why:  Hospital Bed-Overlay mattress, Bedside commode, Wheelchair. [employer].        Dorothy Spark, MD. Schedule an appointment as soon as possible for a visit in 4 week(s).   Specialty:  Cardiology Contact information: [place] [address] [phone]        [place]. Schedule an appointment as soon as possible for a visit in 1 week(s).   Specialty:  Cardiology Why:  Outpatient referral ordered  Contact information: [address] [phone] [place] [phone]       Your PCP. Schedule an appointment as soon as possible for a visit in 1 week(s).   Why:  Establish with and make an appointment as soon as possible for hospital follow up. You will also need INR check (coumadin level) in the next 2-3 days after discharge.          Not on File  Consultations:  Cardiology   Procedures/Studies: Dg Chest 2 View  Result Date: 05/13/2018 CLINICAL DATA:  Shortness of breath. EXAM: CHEST - 2 VIEW COMPARISON:  05/11/2018 FINDINGS: Examination is degraded due to patient habitus. Grossly unchanged enlarged cardiac silhouette and mediastinal contours. Pulmonary vasculature appears slightly less distinct on the present examination with cephalization of flow. There is minimal pleuroparenchymal thickening about the bilateral major fissures. Minimal perihilar heterogeneous opacities favored to represent atelectasis. No new focal airspace opacities. Trace pleural effusions are not excluded. No pneumothorax. No acute osseous abnormalities. IMPRESSION: Cardiomegaly with suspected mild pulmonary edema and trace bilateral effusions on this body habitus degraded examination. Electronically Signed   By: Sandi Mariscal M.D.   On: 05/13/2018 15:23   Ct Head Wo Contrast  Result Date: 05/17/2018 CLINICAL DATA:  Altered sensation on the left  side. Slow to speak and follow commands. Hypertension. EXAM: CT HEAD WITHOUT CONTRAST TECHNIQUE: Contiguous axial images were obtained from the base of the skull through the vertex without intravenous contrast. COMPARISON:  None. FINDINGS: Brain: No evidence of acute infarction,  hemorrhage, hydrocephalus, extra-axial collection or mass lesion/mass effect. Diffuse cerebral atrophy. Low-attenuation changes in the deep white matter consistent with central atrophy. Vascular: Mild intracranial arterial vascular calcifications. Skull: Calvarium appears intact. No acute depressed skull fractures. Sinuses/Orbits: Paranasal sinuses are clear. Partial opacification of mastoid air cells bilaterally. Other: None. IMPRESSION: No acute intracranial abnormalities. Chronic atrophy and small vessel ischemic changes. Bilateral partial mastoid effusions. Electronically Signed   By: Lucienne Capers M.D.   On: 05/17/2018 00:10   US Renal  Result Date: 05/13/2018 CLINICAL DATA:  Renal insufficiency.  Fluid retention. EXAM: RENAL / URINARY TRACT ULTRASOUND COMPLETE COMPARISON:  None. FINDINGS: Degraded exam secondary to patient morbid obesity and immobility. Right Kidney: Renal measurements: 11.8 x 4.5 x 6.4 cm = volume: 179 mL. Grossly normal echogenicity. No hydronephrosis. Left Kidney: Renal measurements: 10.0 x 5.9 x 5.9 cm = volume: 182.7 mL. Suboptimally visualized. No gross abnormality identified. No hydronephrosis. Bladder: Appears normal for degree of bladder distention. IMPRESSION: 1. Moderate limitations as detailed above. 2. No hydronephrosis or specific explanation for renal insufficiency. Electronically Signed   By: Abigail Miyamoto M.D.   On: 05/13/2018 15:49   Nm Pulmonary Perf And Vent  Result Date: 05/13/2018 CLINICAL DATA:  Concern for pulmonary embolism. EXAM: NUCLEAR MEDICINE VENTILATION - PERFUSION LUNG SCAN TECHNIQUE: Ventilation images were obtained in multiple projections using inhaled aerosol Tc-36m DTPA.  Perfusion images were obtained in multiple projections after intravenous injection of Tc-45m MAA. RADIOPHARMACEUTICALS:  32.3 mCi of Tc-56m DTPA aerosol inhalation and 4.22 mCi Tc7m MAA IV COMPARISON:  Chest radiograph-earlier same day; 05/11/2018 FINDINGS: Review of chest radiograph performed earlier today demonstrates unchanged enlarged cardiac silhouette and mediastinal contours. Chronic pulmonary venous congestion without frank evidence of edema. There is persistent mild elevation of the left hemidiaphragm. No definite pleural effusion or pneumothorax. Ventilation: Ventilatory images demonstrates clumping of inhaled radiotracer about the bilateral pulmonary hila, right greater than left. Ingested radiotracer is seen within the oro and hypopharynx as well as the mid esophagus. Perfusion: Perfusion images demonstrate relative homogeneous distribution of injected radiotracer throughout the bilateral pulmonary parenchyma without discrete area mismatched perfusion to suggest pulmonary embolism. IMPRESSION: Pulmonary embolism absent (very low probability of pulmonary embolism). Electronically Signed   By: Sandi Mariscal M.D.   On: 05/13/2018 15:21   Dg Chest Port 1 View  Result Date: 05/11/2018 CLINICAL DATA:  Shortness of breath. Hypertension. EXAM: PORTABLE CHEST 1 VIEW COMPARISON:  None. FINDINGS: Mildly degraded exam due to AP portable technique and patient body habitus. Apical lordotic positioning. Midline trachea. Cardiomegaly accentuated by AP portable technique. Mild left hemidiaphragm elevation. No right and no definite left pleural effusion. Numerous leads and wires project over the chest. No pneumothorax. No congestive failure. Relatively low lung volumes. This accentuates the pulmonary interstitium. IMPRESSION: Cardiomegaly and low lung volumes, without acute disease. Electronically Signed   By: Abigail Miyamoto M.D.   On: 05/11/2018 13:47   Vas [place] Lower Extremity Venous (dvt)  Result Date: 05/12/2018  Lower  Venous Study Indications: Edema.  Limitations: Body habitus. Comparison Study: No prior study on file Performing Technologist: Sharion Dove RVS  Examination Guidelines: A complete evaluation includes B-mode imaging, spectral Doppler, color Doppler, and power Doppler as needed of all accessible portions of each vessel. Bilateral testing is considered an integral part of a complete examination. Limited examinations for reoccurring indications may be performed as noted.  Right Venous Findings: +---------+---------------+---------+-----------+----------+--------------+          CompressibilityPhasicitySpontaneityPropertiesSummary        +---------+---------------+---------+-----------+----------+--------------+  CFV                                                   visualized     +---------+---------------+---------+-----------+----------+--------------+ FV Prox                                               Not visualized +---------+---------------+---------+-----------+----------+--------------+ FV Mid                                                visualized     +---------+---------------+---------+-----------+----------+--------------+ FV Distal                                             Not visualized +---------+---------------+---------+-----------+----------+--------------+ PFV                                                   Not visualized +---------+---------------+---------+-----------+----------+--------------+ POP                                                   Not visualized +---------+---------------+---------+-----------+----------+--------------+ PTV                                                   Not visualized +---------+---------------+---------+-----------+----------+--------------+  Left Venous Findings: +---------+---------------+---------+-----------+----------+--------------+           CompressibilityPhasicitySpontaneityPropertiesSummary        +---------+---------------+---------+-----------+----------+--------------+ CFV                                                   Not visualized +---------+---------------+---------+-----------+----------+--------------+ FV Prox                                               Not visualized +---------+---------------+---------+-----------+----------+--------------+ FV Mid                                                Not visualized +---------+---------------+---------+-----------+----------+--------------+ FV Distal  Not visualized +---------+---------------+---------+-----------+----------+--------------+ PFV                                                   Not visualized +---------+---------------+---------+-----------+----------+--------------+ POP      Full           Yes      Yes                                 +---------+---------------+---------+-----------+----------+--------------+ PTV                                                   Not visualized +---------+---------------+---------+-----------+----------+--------------+ PERO                                                  Not visualized +---------+---------------+---------+-----------+----------+--------------+    Summary: Right: There is no evidence of deep vein thrombosis in the lower extremity. However, portions of this examination were limited- see technologist comments above. Left: There is no evidence of deep vein thrombosis in the lower extremity. However, portions of this examination were limited- see technologist comments above.  *See table(s) above for measurements and observations. Electronically signed by Monica Martinez MD on 05/12/2018 at 4:29:14 PM.    Final    [place] Ekg Site Rite  Result Date: 05/13/2018 If Site Rite image not attached, placement could not be confirmed due to  current cardiac rhythm.   Echocardiogram 05/12/2018 IMPRESSIONS    1. The left ventricle has mild-moderately reduced systolic function, with an ejection fraction of 40-45%. The cavity size was normal. There is moderate concentric left ventricular hypertrophy. Left ventricular diastology could not be evaluated Elevated  left ventricular end-diastolic pressure.  2. The right ventricle has moderately reduced systolic function. The cavity was normal. There is no increase in right ventricular wall thickness.  3. Left atrial size was moderately dilated.  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 The MR jet is posteriorly-directed.  5. The tricuspid valve is normal in structure. Tricuspid valve regurgitation is moderate-severe.  6. The aortic valve is tricuspid Mild thickening of the aortic valve Mild calcification of the aortic valve.  7. There is evidence of plaque in the ascending aorta.  8. Pulmonary hypertension is moderate.  9. The inferior vena cava was dilated in size with <50% respiratory variability. 10. Mild hypokinesis of the left ventricular inferior wall.   Discharge Exam: Vitals:   05/19/18 0444 05/19/18 0804  BP: (!) 98/59 (!) 84/55  Pulse: (!) 106 (!) 103  Resp:    Temp: 97.6 F (36.4 C) (!) 97.5 F (36.4 C)  SpO2: 93% 96%    General: Pt is alert, awake, not in acute distress Cardiovascular: Irregular rhythm rate 80-90, S1/S2 +, no rubs, no gallops Respiratory: CTA bilaterally anteriorly, distant lung sounds due to her body habitus, no wheezing, no rhonchi, on nasal cannula O2 without significant respiratory distress Abdominal: Soft, NT, ND, bowel sounds + Extremities: no edema, no cyanosis  The results of significant diagnostics from this hospitalization (including imaging, microbiology, ancillary and laboratory) are listed below for reference.     Microbiology: No results found for this or any  previous visit (from the past 240 hour(s)).   Labs: BNP (last 3 results) Recent Labs    05/11/18 1325  BNP 789.3*   Basic Metabolic Panel: Recent Labs  Lab 05/15/18 0548 05/16/18 1122 05/17/18 0320 05/18/18 0402 05/19/18 0118  NA 135 136 136 136 137  K 4.6 4.5 4.5 4.4 4.6  CL 99 99 99 98 99  CO2 29 32 32 32 32  GLUCOSE 103* 116* 110* 110* 110*  BUN 54* 55* 58* 54* 50*  CREATININE 2.32* 1.84* 1.74* 1.51* 1.42*  CALCIUM 8.7* 9.0 8.6* 8.7* 8.5*   Liver Function Tests: No results for input(s): AST, ALT, ALKPHOS, BILITOT, PROT, ALBUMIN in the last 168 hours. No results for input(s): LIPASE, AMYLASE in the last 168 hours. No results for input(s): AMMONIA in the last 168 hours. CBC: Recent Labs  Lab 05/13/18 0508 05/14/18 0412 05/16/18 0500 05/17/18 0320 05/18/18 0402 05/19/18 0118  WBC 9.0 7.1 5.1 5.4 5.2 5.3  NEUTROABS 7.0 5.4  --   --   --   --   HGB 12.1 11.5* 10.9* 9.4* 8.8* 7.9*  HCT 39.4 38.4 35.8* 30.4* 29.1* 25.9*  MCV 101.8* 101.1* 101.1* 99.3 99.7 98.9  PLT 102* 108* 122* 125* 127* 143*   Cardiac Enzymes: No results for input(s): CKTOTAL, CKMB, CKMBINDEX, TROPONINI in the last 168 hours. BNP: Invalid input(s): POCBNP CBG: Recent Labs  Lab 05/16/18 2232  GLUCAP 108*   D-Dimer No results for input(s): DDIMER in the last 72 hours. Hgb A1c No results for input(s): HGBA1C in the last 72 hours. Lipid Profile No results for input(s): CHOL, HDL, LDLCALC, TRIG, CHOLHDL, LDLDIRECT in the last 72 hours. Thyroid function studies No results for input(s): TSH, T4TOTAL, T3FREE, THYROIDAB in the last 72 hours.  Invalid input(s): FREET3 Anemia work up No results for input(s): VITAMINB12, FOLATE, FERRITIN, TIBC, IRON, RETICCTPCT in the last 72 hours. Urinalysis    Component Value Date/Time   COLORURINE AMBER (A) 05/11/2018 2030   APPEARANCEUR CLOUDY (A) 05/11/2018 2030   LABSPEC 1.026 05/11/2018 2030   [place] 5.0 05/11/2018 2030   GLUCOSEU NEGATIVE  05/11/2018 2030   HGBUR SMALL (A) 05/11/2018 2030   BILIRUBINUR SMALL (A) 05/11/2018 2030   [place] NEGATIVE 05/11/2018 2030   PROTEINUR 30 (A) 05/11/2018 2030   NITRITE NEGATIVE 05/11/2018 2030   LEUKOCYTESUR SMALL (A) 05/11/2018 2030   Sepsis Labs Invalid input(s): PROCALCITONIN,  WBC,  LACTICIDVEN Microbiology No results found for this or any previous visit (from the past 240 hour(s)).   Patient was seen and examined on the day of discharge and was found to be in stable condition. Time coordinating discharge: 35 minutes including assessment and coordination of care, as well as examination of the patient.   SIGNED:  Dessa Phi, DO [facility] [email or web address] 05/19/2018, 12:07 PM

## 2018-05-19 NOTE — Progress Notes (Signed)
ANTICOAGULATION CONSULT NOTE   Pharmacy Consult for Warfarin  Indication: Afib  Patient Measurements: Height: 5\' 4"  (162.6 cm) Weight: (!) 487 lb 9.6 oz (221.2 kg) IBW/kg (Calculated) : 54.7  Heparin dose weight: 114.5 kg   Vital Signs: Temp: 98.2 F (36.8 C) (03/14 0014) Temp Source: Oral (03/14 0014) BP: 114/72 (03/14 0014) Pulse Rate: 95 (03/14 0014)  Labs: Recent Labs    05/17/18 0320 05/18/18 0402 05/18/18 1300 05/19/18 0000 05/19/18 0118  HGB 9.4* 8.8*  --   --  7.9*  HCT 30.4* 29.1*  --   --  25.9*  PLT 125* 127*  --   --  143*  LABPROT 16.9* 18.9*  --   --  25.5*  INR 1.4* 1.6*  --   --  2.4*  HEPARINUNFRC 0.38 0.28*  --  0.71*  --   HEPRLOWMOCWT  --   --  0.36  --   --   CREATININE 1.74* 1.51*  --   --  1.42*    Assessment: 73 yo female with Afib for anticoagulation.  Currently receiving heparin and warfarin.  INR now therapeutic.  Goal of Therapy:  INR 2-3 Monitor platelets by anticoagulation protocol: Yes   Plan:  D/C heparin with INR > 2. Coumadin 5 mg today Daily INR  Phillis Knack, PharmD, BCPS

## 2018-05-29 ENCOUNTER — Telehealth (HOSPITAL_COMMUNITY): Payer: Self-pay | Admitting: *Deleted

## 2018-05-29 NOTE — Telephone Encounter (Signed)
LMOM for pt to [facility] for scheduling virtual visit or telephone visit

## 2018-05-30 ENCOUNTER — Ambulatory Visit (HOSPITAL_COMMUNITY)
Admission: RE | Admit: 2018-05-30 | Discharge: 2018-05-30 | Disposition: A | Discharge status: Home / Self Care | Payer: Medicare Other | Source: Ambulatory Visit | Attending: Nurse Practitioner | Admitting: Nurse Practitioner

## 2018-05-30 ENCOUNTER — Other Ambulatory Visit: Payer: Self-pay

## 2018-05-30 DIAGNOSIS — I4891 Unspecified atrial fibrillation: Secondary | ICD-10-CM

## 2018-05-30 NOTE — Progress Notes (Signed)
Electrophysiology TeleHealth Note   Due to national recommendations of social distancing due to [place] 19, audio telehealth visit is felt to be most appropriate for this patient at this time.  See MyChart message/consent below from today for patient consent regarding telehealth for the [facility].   Date:  05/30/2018   ID:  [name], DOB [birth year], MRN [id number]  Location: home  Provider location: [place], [place] [address] Evaluation Performed: New patient consult   PCP:   Name unknown Primary Cardiologist:  Dr. Meda Coffee Primary Electrophysiologist: none   CC: New onset afib   History of Present Illness: Jean Stewart is a 73 y.o. female  who presents via Engineer, civil (consulting) for a telehealth visit today.   The patient is referred for new consultation  for new onset afib.. she also has h/o hypertension and obesity who is a very poor historian. She presented to the ER with complaints of generalized weakness and significant LE swelling. She subsequently reported   ongoing for over a year. Patient was found in the ER with A. fib with RVR, heart rate as high as 129, blood pressure was low normal 97/57. She was saturating 88% on room air and was put on 4 L of oxygen. Troponin 0.06.Creatinine 2.21, baseline unknown.A chest x-ray shows cardiomegaly. Twelve-lead EKG showed  A. fib with RVR. Patient was started on Cardizem infusion in the ER.  Cardiology was consulted.Eventually, cardizem transitioned to PO Toprol, heart rate remained controlled.  She was also diuresed with IV Lasix which was transitioned to p.o. PT recommended SNF, which family has declined.  She was asked to f/u in the afib clinic but due to  pt's super obesity(486.64 lbs), basically bedridden, and with the threat of the corona virus, I am having an audio visit with her daughter, Britt Boozer. She reports that her mother has been improved since she has been home. They tried to get  her out of bed once, but her legs buckled and she almost fell.She is wearing her O2 via [place].  They are serving her a low salt/low fat diet. She has not c/o of any irregular heart beat. She is new to warfarin but has not had an INR checked since d/c 3/14. She was suppose to have a nurse come into the home last Friday but for some reason she did not come and the daughter is waiting for her PCP to call her this PM. Meds that the pt is taking were reviewed  and is consistent with the drug list on d/c.   Today, she denies symptoms of palpitations, chest pain, shortness of breath, orthopnea, PND, lower extremity edema, claudication, dizziness, presyncope, syncope, bleeding, or neurologic sequela. The patient is tolerating medications without difficulties and is otherwise without complaint today.   she denies symptoms of cough, fevers, chills, or new SOB worrisome for COVID 19.     Atrial Fibrillation Risk Factors:    . she does not have a history of rheumatic fever. she does not have a history of alcohol use. The patient does not have a history of early familial atrial fibrillation or other arrhythmias.  she has a BMI of There is no height or weight on file to calculate BMI.. There were no vitals filed for this visit.  Past Medical History:  Diagnosis Date  . Hypertension   . Morbid obesity ([place])   . Physical deconditioning   . Suspected sleep apnea    No past surgical history on file.  Current Outpatient Medications  Medication Sig Dispense Refill  . acetaminophen (TYLENOL) 500 MG tablet Take 1,000 mg by mouth every 6 (six) hours as needed for mild pain.    Marland Kitchen docusate sodium (COLACE) 100 MG capsule Take 200 mg by mouth daily as needed for mild constipation.    . furosemide (LASIX) 40 MG tablet Take 1 tablet (40 mg total) by mouth 2 (two) times daily. 30 tablet 60  . hydrocortisone 2.5 % cream Apply 1 application topically 2 (two) times daily as needed for itching.    . metoprolol  succinate (TOPROL-XL) 100 MG 24 hr tablet Take 1 tablet (100 mg total) by mouth daily. Take with or immediately following a meal. 30 tablet 0  . warfarin (COUMADIN) 5 MG tablet Take 1 tablet (5 mg total) by mouth daily. 30 tablet 0   No current facility-administered medications for this encounter.     Allergies:   Patient has no allergy information on record.   Social History:  The patient  reports that she has quit smoking. She has never used smokeless tobacco. She reports previous alcohol use. She reports that she does not use drugs.   Family History:  The patient's  family history includes CAD in her mother.    ROS:  Please see the history of present illness.   All other systems are personally reviewed and negative.   Exam: na  Recent Labs: 05/11/2018: ALT 30; B Natriuretic Peptide 309.8; TSH 1.166 05/19/2018: BUN 50; Creatinine, Ser 1.42; Hemoglobin 7.9; Platelets 143; Potassium 4.6; Sodium 137  personally reviewed  INR 2.4 3/14   Other studies personally reviewed: Epic records reviewed today Echo-IMPRESSIONS    1. The left ventricle has mild-moderately reduced systolic function, with an ejection fraction of 40-45%. The cavity size was normal. There is moderate concentric left ventricular hypertrophy. Left ventricular diastology could not be evaluated Elevated  left ventricular end-diastolic pressure.  2. The right ventricle has moderately reduced systolic function. The cavity was normal. There is no increase in right ventricular wall thickness.  3. Left atrial size was moderately dilated.  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 The MR jet is posteriorly-directed.  5. The tricuspid valve is normal in structure. Tricuspid valve regurgitation is moderate-severe.  6. The aortic valve is tricuspid Mild thickening of the aortic valve Mild calcification of the aortic valve.  7. There is evidence of plaque in the  ascending aorta.  8. Pulmonary hypertension is moderate.  9. The inferior vena cava was dilated in size with <50% respiratory variability. 10. Mild hypokinesis of the left ventricular inferior wall.      ASSESSMENT AND PLAN:  1. Persistent atrial fibrillation New onset, appears to be now rate controlled with daughter reporting a HR of 88 bpm, BP of 106/58 and afebrile at 96.9. Continue metoprolol without change   2.This patients CHA2DS2-VASc Score and unadjusted Ischemic Stroke Rate (% per year) is equal to 4.8 % stroke rate/year from a score of 4  Above score calculated as 1 point each if present [CHF, HTN, DM, Vascular=MI/PAD/Aortic Plaque, Age if 65-74, or Female] Above score calculated as 2 points each if present [Age > 75, or Stroke/TIA/TE]  Continue warfarin, but needs an INR since this has not been checked since discharge Daughter to be in touch with PCP this afternoon as to get a [facility] nurse in the home to check INR as pt is bedridden Pt was also anemic and  will need CBC   COVID screen The patient does not have any symptoms that suggest any further testing/ screening at this time.  Social distancing reinforced today.   Follow-up: with PCP   Current medicines are reviewed at length with the patient today.   The patient does not have concerns regarding her medicines.  The following changes were made today:  none  Labs/ tests ordered today include: none No orders of the defined types were placed in this encounter.   Patient Risk:  after full review of this patients clinical status, I feel that they are at huigh risk at this time.   Today, I have spent 12 minutes with the patient with telehealth technology discussing  afib and current health problems compliance with meds and diet.    Signed, Roderic Palau NP  05/30/2018 2:43 PM  [facility] [place], [place] [address] [phone]   I hereby voluntarily request, consent and  authorize the [facility] and its employed or contracted physicians, physician assistants, nurse practitioners or other licensed health care professionals (the Practitioner), to provide me with telemedicine health care services (the "Services") as deemed necessary by the treating Practitioner. I acknowledge and consent to receive the Services by the Practitioner via telemedicine. I understand that the telemedicine visit will involve communicating with the Practitioner through live audiovisual communication technology and the disclosure of certain medical information by electronic transmission. I acknowledge that I have been given the opportunity to request an in-person assessment or other available alternative prior to the telemedicine visit and am voluntarily participating in the telemedicine visit.   I understand that I have the right to withhold or withdraw my consent to the use of telemedicine in the course of my care at any time, without affecting my right to future care or treatment, and that the Practitioner or I may terminate the telemedicine visit at any time. I understand that I have the right to inspect all information obtained and/or recorded in the course of the telemedicine visit and may receive copies of available information for a reasonable fee.  I understand that some of the potential risks of receiving the Services via telemedicine include:   Delay or interruption in medical evaluation due to technological equipment failure or disruption;  Information transmitted may not be sufficient (e.g. poor resolution of images) to allow for appropriate medical decision making by the Practitioner; and/or  In rare instances, security protocols could fail, causing a breach of personal health information.   Furthermore, I acknowledge that it is my responsibility to provide information about my medical history, conditions and care that is complete and accurate to the best of my ability. I  acknowledge that Practitioner's advice, recommendations, and/or decision may be based on factors not within their control, such as incomplete or inaccurate data provided by me or distortions of diagnostic images or specimens that may result from electronic transmissions. I understand that the practice of medicine is not an exact science and that Practitioner makes no warranties or guarantees regarding treatment outcomes. I acknowledge that I will receive a copy of this consent concurrently upon execution via email to the email address I last provided but may also request a printed copy by calling the office of the [facility].  I understand that my insurance will be billed for this visit.   I have read or had this consent read to me.  I understand the contents of this consent, which adequately explains the  benefits and risks of the Services being provided via telemedicine.  I have been provided ample opportunity to ask questions regarding this consent and the Services and have had my questions answered to my satisfaction.  I give my informed consent for the services to be provided through the use of telemedicine in my medical care  By participating in this telemedicine visit I agree to the above.

## 2018-12-03 ENCOUNTER — Emergency Department (HOSPITAL_COMMUNITY): Payer: Medicare Other

## 2018-12-03 ENCOUNTER — Encounter (HOSPITAL_COMMUNITY)
Admission: EM | Disposition: A | Discharge status: Skilled Nursing Facility | Payer: Self-pay | Source: Home / Self Care | Attending: Internal Medicine

## 2018-12-03 ENCOUNTER — Encounter (HOSPITAL_COMMUNITY): Payer: Self-pay | Admitting: Emergency Medicine

## 2018-12-03 ENCOUNTER — Emergency Department (HOSPITAL_COMMUNITY): Payer: Medicare Other | Admitting: Certified Registered"

## 2018-12-03 ENCOUNTER — Other Ambulatory Visit: Payer: Self-pay

## 2018-12-03 ENCOUNTER — Inpatient Hospital Stay (HOSPITAL_COMMUNITY)
Admission: EM | Admit: 2018-12-03 | Discharge: 2018-12-10 | DRG: 464 | Disposition: A | Discharge status: Skilled Nursing Facility | Payer: Medicare Other | Attending: Internal Medicine | Admitting: Internal Medicine

## 2018-12-03 DIAGNOSIS — N179 Acute kidney failure, unspecified: Secondary | ICD-10-CM | POA: Diagnosis not present

## 2018-12-03 DIAGNOSIS — J449 Chronic obstructive pulmonary disease, unspecified: Secondary | ICD-10-CM | POA: Diagnosis present

## 2018-12-03 DIAGNOSIS — Z6841 Body Mass Index (BMI) 40.0 and over, adult: Secondary | ICD-10-CM | POA: Diagnosis not present

## 2018-12-03 DIAGNOSIS — I10 Essential (primary) hypertension: Secondary | ICD-10-CM | POA: Diagnosis not present

## 2018-12-03 DIAGNOSIS — I5042 Chronic combined systolic (congestive) and diastolic (congestive) heart failure: Secondary | ICD-10-CM | POA: Diagnosis not present

## 2018-12-03 DIAGNOSIS — W19XXXA Unspecified fall, initial encounter: Secondary | ICD-10-CM

## 2018-12-03 DIAGNOSIS — D638 Anemia in other chronic diseases classified elsewhere: Secondary | ICD-10-CM | POA: Diagnosis not present

## 2018-12-03 DIAGNOSIS — Z7901 Long term (current) use of anticoagulants: Secondary | ICD-10-CM | POA: Diagnosis not present

## 2018-12-03 DIAGNOSIS — Z79899 Other long term (current) drug therapy: Secondary | ICD-10-CM

## 2018-12-03 DIAGNOSIS — T148XXA Other injury of unspecified body region, initial encounter: Secondary | ICD-10-CM | POA: Diagnosis present

## 2018-12-03 DIAGNOSIS — I4821 Permanent atrial fibrillation: Secondary | ICD-10-CM | POA: Diagnosis present

## 2018-12-03 DIAGNOSIS — I130 Hypertensive heart and chronic kidney disease with heart failure and stage 1 through stage 4 chronic kidney disease, or unspecified chronic kidney disease: Secondary | ICD-10-CM | POA: Diagnosis present

## 2018-12-03 DIAGNOSIS — D696 Thrombocytopenia, unspecified: Secondary | ICD-10-CM | POA: Diagnosis present

## 2018-12-03 DIAGNOSIS — D631 Anemia in chronic kidney disease: Secondary | ICD-10-CM | POA: Diagnosis present

## 2018-12-03 DIAGNOSIS — E860 Dehydration: Secondary | ICD-10-CM | POA: Diagnosis present

## 2018-12-03 DIAGNOSIS — Z8249 Family history of ischemic heart disease and other diseases of the circulatory system: Secondary | ICD-10-CM | POA: Diagnosis not present

## 2018-12-03 DIAGNOSIS — Z419 Encounter for procedure for purposes other than remedying health state, unspecified: Secondary | ICD-10-CM

## 2018-12-03 DIAGNOSIS — S82852C Displaced trimalleolar fracture of left lower leg, initial encounter for open fracture type IIIA, IIIB, or IIIC: Principal | ICD-10-CM | POA: Diagnosis present

## 2018-12-03 DIAGNOSIS — S82899B Other fracture of unspecified lower leg, initial encounter for open fracture type I or II: Secondary | ICD-10-CM | POA: Diagnosis present

## 2018-12-03 DIAGNOSIS — Z20828 Contact with and (suspected) exposure to other viral communicable diseases: Secondary | ICD-10-CM | POA: Diagnosis present

## 2018-12-03 DIAGNOSIS — Z9981 Dependence on supplemental oxygen: Secondary | ICD-10-CM | POA: Diagnosis not present

## 2018-12-03 DIAGNOSIS — I5040 Unspecified combined systolic (congestive) and diastolic (congestive) heart failure: Secondary | ICD-10-CM | POA: Diagnosis present

## 2018-12-03 DIAGNOSIS — S82892B Other fracture of left lower leg, initial encounter for open fracture type I or II: Secondary | ICD-10-CM | POA: Diagnosis not present

## 2018-12-03 DIAGNOSIS — N183 Chronic kidney disease, stage 3 unspecified: Secondary | ICD-10-CM | POA: Diagnosis present

## 2018-12-03 DIAGNOSIS — X501XXA Overexertion from prolonged static or awkward postures, initial encounter: Secondary | ICD-10-CM | POA: Diagnosis not present

## 2018-12-03 DIAGNOSIS — Z87891 Personal history of nicotine dependence: Secondary | ICD-10-CM

## 2018-12-03 DIAGNOSIS — S9305XA Dislocation of left ankle joint, initial encounter: Secondary | ICD-10-CM | POA: Diagnosis present

## 2018-12-03 DIAGNOSIS — S82853C Displaced trimalleolar fracture of unspecified lower leg, initial encounter for open fracture type IIIA, IIIB, or IIIC: Secondary | ICD-10-CM | POA: Diagnosis present

## 2018-12-03 DIAGNOSIS — E559 Vitamin D deficiency, unspecified: Secondary | ICD-10-CM | POA: Diagnosis present

## 2018-12-03 DIAGNOSIS — D62 Acute posthemorrhagic anemia: Secondary | ICD-10-CM | POA: Diagnosis not present

## 2018-12-03 HISTORY — PX: EXTERNAL FIXATION LEG: SHX1549

## 2018-12-03 HISTORY — PX: I & D EXTREMITY: SHX5045

## 2018-12-03 HISTORY — DX: Unspecified atrial fibrillation: I48.91

## 2018-12-03 HISTORY — DX: Hepatic failure, unspecified without coma: K72.90

## 2018-12-03 HISTORY — PX: ORIF ANKLE FRACTURE: SHX5408

## 2018-12-03 HISTORY — DX: Chronic obstructive pulmonary disease, unspecified: J44.9

## 2018-12-03 HISTORY — DX: Polyneuropathy, unspecified: G62.9

## 2018-12-03 LAB — BASIC METABOLIC PANEL
Anion gap: 14 (ref 5–15)
BUN: 23 mg/dL (ref 8–23)
CO2: 25 mmol/L (ref 22–32)
Calcium: 9.1 mg/dL (ref 8.9–10.3)
Chloride: 100 mmol/L (ref 98–111)
Creatinine, Ser: 1.59 mg/dL — ABNORMAL HIGH (ref 0.44–1.00)
GFR calc Af Amer: 36 mL/min — ABNORMAL LOW (ref >60)
GFR calc non Af Amer: 31 mL/min — ABNORMAL LOW (ref >60)
Glucose, Bld: 131 mg/dL — ABNORMAL HIGH (ref 70–99)
Potassium: 4.9 mmol/L (ref 3.5–5.1)
Sodium: 139 mmol/L (ref 135–145)

## 2018-12-03 LAB — CBC WITH DIFFERENTIAL/PLATELET
Abs Immature Granulocytes: 0.03 10*3/uL (ref 0.00–0.07)
Basophils Absolute: 0 10*3/uL (ref 0.0–0.1)
Basophils Relative: 0 %
Eosinophils Absolute: 0.2 10*3/uL (ref 0.0–0.5)
Eosinophils Relative: 3 %
HCT: 36.8 % (ref 36.0–46.0)
Hemoglobin: 11.4 g/dL — ABNORMAL LOW (ref 12.0–15.0)
Immature Granulocytes: 1 %
Lymphocytes Relative: 36 %
Lymphs Abs: 2 10*3/uL (ref 0.7–4.0)
MCH: 31.4 pg (ref 26.0–34.0)
MCHC: 31 g/dL (ref 30.0–36.0)
MCV: 101.4 fL — ABNORMAL HIGH (ref 80.0–100.0)
Monocytes Absolute: 0.2 10*3/uL (ref 0.1–1.0)
Monocytes Relative: 3 %
Neutro Abs: 3.1 10*3/uL (ref 1.7–7.7)
Neutrophils Relative %: 57 %
Platelets: 143 10*3/uL — ABNORMAL LOW (ref 150–400)
RBC: 3.63 MIL/uL — ABNORMAL LOW (ref 3.87–5.11)
RDW: 13.4 % (ref 11.5–15.5)
WBC: 5.5 10*3/uL (ref 4.0–10.5)
nRBC: 0 % (ref 0.0–0.2)

## 2018-12-03 LAB — PROTIME-INR
INR: 2.6 — ABNORMAL HIGH (ref 0.8–1.2)
Prothrombin Time: 27.3 seconds — ABNORMAL HIGH (ref 11.4–15.2)

## 2018-12-03 LAB — SAMPLE TO BLOOD BANK

## 2018-12-03 LAB — SARS CORONAVIRUS 2 BY RT PCR (HOSPITAL ORDER, PERFORMED IN ~~LOC~~ HOSPITAL LAB): SARS Coronavirus 2: NEGATIVE

## 2018-12-03 SURGERY — IRRIGATION AND DEBRIDEMENT EXTREMITY
Anesthesia: General | Site: Ankle | Laterality: Left

## 2018-12-03 MED ORDER — PROPOFOL 10 MG/ML IV BOLUS
INTRAVENOUS | Status: AC
  Filled 2018-12-03: qty 20

## 2018-12-03 MED ORDER — 0.9 % SODIUM CHLORIDE (POUR BTL) OPTIME
TOPICAL | Status: DC | PRN
  Administered 2018-12-03: 15:00:00 1000 mL

## 2018-12-03 MED ORDER — POVIDONE-IODINE 10 % EX SWAB: 2.0000 "application " | Freq: Once | CUTANEOUS | Status: DC

## 2018-12-03 MED ORDER — VANCOMYCIN HCL 1000 MG IV SOLR
INTRAVENOUS | Status: AC
  Filled 2018-12-03: qty 1000

## 2018-12-03 MED ORDER — PHENYLEPHRINE 40 MCG/ML (10ML) SYRINGE FOR IV PUSH (FOR BLOOD PRESSURE SUPPORT)
PREFILLED_SYRINGE | INTRAVENOUS | Status: AC
  Filled 2018-12-03: qty 10

## 2018-12-03 MED ORDER — ONDANSETRON HCL 4 MG/2ML IJ SOLN
INTRAMUSCULAR | Status: AC
  Filled 2018-12-03: qty 2

## 2018-12-03 MED ORDER — HYDROMORPHONE HCL 1 MG/ML IJ SOLN: 1.0000 mg | INTRAMUSCULAR | Status: DC | PRN

## 2018-12-03 MED ORDER — LACTATED RINGERS IV SOLN
INTRAVENOUS | Status: DC | PRN
  Administered 2018-12-03 (×2): via INTRAVENOUS

## 2018-12-03 MED ORDER — ALBUMIN HUMAN 5 % IV SOLN
INTRAVENOUS | Status: DC | PRN
  Administered 2018-12-03: 16:00:00 via INTRAVENOUS

## 2018-12-03 MED ORDER — FENTANYL CITRATE (PF) 100 MCG/2ML IJ SOLN
INTRAMUSCULAR | Status: DC | PRN
  Administered 2018-12-03 (×2): 50 ug via INTRAVENOUS

## 2018-12-03 MED ORDER — LACTATED RINGERS IV SOLN: INTRAVENOUS | Status: DC

## 2018-12-03 MED ORDER — METHOCARBAMOL 500 MG PO TABS
500.0000 mg | ORAL_TABLET | Freq: Four times a day (QID) | ORAL | Status: DC | PRN
  Administered 2018-12-03: 500 mg via ORAL
  Filled 2018-12-03: qty 1

## 2018-12-03 MED ORDER — SUCCINYLCHOLINE CHLORIDE 200 MG/10ML IV SOSY
PREFILLED_SYRINGE | INTRAVENOUS | Status: DC | PRN
  Administered 2018-12-03: 120 mg via INTRAVENOUS

## 2018-12-03 MED ORDER — SUCCINYLCHOLINE CHLORIDE 200 MG/10ML IV SOSY
PREFILLED_SYRINGE | INTRAVENOUS | Status: AC
  Filled 2018-12-03: qty 20

## 2018-12-03 MED ORDER — VANCOMYCIN HCL 1000 MG IV SOLR
INTRAVENOUS | Status: DC | PRN
  Administered 2018-12-03: 1000 mg

## 2018-12-03 MED ORDER — OXYCODONE HCL 5 MG PO TABS
10.0000 mg | ORAL_TABLET | ORAL | Status: DC | PRN
  Filled 2018-12-03: qty 2

## 2018-12-03 MED ORDER — EPHEDRINE 5 MG/ML INJ
INTRAVENOUS | Status: AC
  Filled 2018-12-03: qty 10

## 2018-12-03 MED ORDER — FENTANYL CITRATE (PF) 250 MCG/5ML IJ SOLN
INTRAMUSCULAR | Status: AC
  Filled 2018-12-03: qty 5

## 2018-12-03 MED ORDER — SODIUM CHLORIDE 0.9 % IV SOLN
INTRAVENOUS | Status: DC | PRN
  Administered 2018-12-03: 15:00:00 50 ug/min via INTRAVENOUS

## 2018-12-03 MED ORDER — HYDROMORPHONE HCL 1 MG/ML IJ SOLN
0.2500 mg | INTRAMUSCULAR | Status: DC | PRN
  Administered 2018-12-03 (×2): 0.25 mg via INTRAVENOUS
  Administered 2018-12-03: 0.5 mg via INTRAVENOUS

## 2018-12-03 MED ORDER — METOPROLOL TARTRATE 5 MG/5ML IV SOLN
INTRAVENOUS | Status: AC
  Filled 2018-12-03: qty 5

## 2018-12-03 MED ORDER — OXYCODONE HCL 5 MG PO TABS
ORAL_TABLET | ORAL | Status: AC
  Filled 2018-12-03: qty 1

## 2018-12-03 MED ORDER — METOPROLOL TARTRATE 5 MG/5ML IV SOLN
INTRAVENOUS | Status: DC | PRN
  Administered 2018-12-03 (×5): 1 mg via INTRAVENOUS

## 2018-12-03 MED ORDER — OXYCODONE HCL 5 MG PO TABS
5.0000 mg | ORAL_TABLET | ORAL | Status: DC | PRN
  Administered 2018-12-03 – 2018-12-04 (×2): 10 mg via ORAL
  Filled 2018-12-03: qty 2

## 2018-12-03 MED ORDER — CHLORHEXIDINE GLUCONATE 4 % EX LIQD: 60.0000 mL | Freq: Once | CUTANEOUS | Status: DC

## 2018-12-03 MED ORDER — SODIUM CHLORIDE 0.9 % IV SOLN
2.0000 g | INTRAVENOUS | Status: AC
  Administered 2018-12-03 – 2018-12-05 (×3): 2 g via INTRAVENOUS
  Filled 2018-12-03 (×3): qty 20

## 2018-12-03 MED ORDER — ACETAMINOPHEN 500 MG PO TABS
1000.0000 mg | ORAL_TABLET | Freq: Four times a day (QID) | ORAL | Status: DC
  Administered 2018-12-03 – 2018-12-10 (×27): 1000 mg via ORAL
  Filled 2018-12-03 (×29): qty 2

## 2018-12-03 MED ORDER — LIDOCAINE 2% (20 MG/ML) 5 ML SYRINGE
INTRAMUSCULAR | Status: AC
  Filled 2018-12-03: qty 5

## 2018-12-03 MED ORDER — HYDROMORPHONE HCL 1 MG/ML IJ SOLN
INTRAMUSCULAR | Status: AC
  Filled 2018-12-03: qty 1

## 2018-12-03 MED ORDER — GABAPENTIN 100 MG PO CAPS
100.0000 mg | ORAL_CAPSULE | Freq: Three times a day (TID) | ORAL | Status: DC
  Administered 2018-12-03 – 2018-12-10 (×20): 100 mg via ORAL
  Filled 2018-12-03 (×20): qty 1

## 2018-12-03 MED ORDER — VITAMIN K1 10 MG/ML IJ SOLN
5.0000 mg | Freq: Once | INTRAVENOUS | Status: AC
  Administered 2018-12-03: 5 mg via INTRAVENOUS
  Filled 2018-12-03: qty 0.5

## 2018-12-03 MED ORDER — TOBRAMYCIN SULFATE 1.2 G IJ SOLR
INTRAMUSCULAR | Status: AC
  Filled 2018-12-03: qty 1.2

## 2018-12-03 MED ORDER — KETAMINE HCL 10 MG/ML IJ SOLN
INTRAMUSCULAR | Status: AC | PRN
  Administered 2018-12-03: 50 mg via INTRAVENOUS

## 2018-12-03 MED ORDER — PHENYLEPHRINE 40 MCG/ML (10ML) SYRINGE FOR IV PUSH (FOR BLOOD PRESSURE SUPPORT)
PREFILLED_SYRINGE | INTRAVENOUS | Status: AC
  Filled 2018-12-03: qty 30

## 2018-12-03 MED ORDER — PROPOFOL 10 MG/ML IV BOLUS
INTRAVENOUS | Status: DC | PRN
  Administered 2018-12-03: 170 mg via INTRAVENOUS

## 2018-12-03 MED ORDER — TOBRAMYCIN SULFATE 1.2 G IJ SOLR
INTRAMUSCULAR | Status: DC | PRN
  Administered 2018-12-03: 1.2 g

## 2018-12-03 MED ORDER — OXYCODONE HCL 5 MG/5ML PO SOLN: 5.0000 mg | Freq: Once | ORAL | Status: AC | PRN

## 2018-12-03 MED ORDER — DEXTROSE 5 % IV SOLN
3.0000 g | INTRAVENOUS | Status: AC
  Administered 2018-12-03: 15:00:00 3 g via INTRAVENOUS
  Filled 2018-12-03: qty 3

## 2018-12-03 MED ORDER — PROMETHAZINE HCL 25 MG/ML IJ SOLN: 6.2500 mg | INTRAMUSCULAR | Status: DC | PRN

## 2018-12-03 MED ORDER — PHENYLEPHRINE 40 MCG/ML (10ML) SYRINGE FOR IV PUSH (FOR BLOOD PRESSURE SUPPORT)
PREFILLED_SYRINGE | INTRAVENOUS | Status: DC | PRN
  Administered 2018-12-03 (×2): 120 ug via INTRAVENOUS
  Administered 2018-12-03: 80 ug via INTRAVENOUS

## 2018-12-03 MED ORDER — BUPIVACAINE HCL 0.5 % IJ SOLN
INTRAMUSCULAR | Status: AC
  Filled 2018-12-03: qty 1

## 2018-12-03 MED ORDER — DEXAMETHASONE SODIUM PHOSPHATE 4 MG/ML IJ SOLN
INTRAMUSCULAR | Status: DC | PRN
  Administered 2018-12-03: 4 mg via INTRAVENOUS

## 2018-12-03 MED ORDER — OXYCODONE HCL 5 MG PO TABS
5.0000 mg | ORAL_TABLET | Freq: Once | ORAL | Status: AC | PRN
  Administered 2018-12-03: 5 mg via ORAL

## 2018-12-03 MED ORDER — ESMOLOL HCL 100 MG/10ML IV SOLN
INTRAVENOUS | Status: DC | PRN
  Administered 2018-12-03: 30 mg via INTRAVENOUS

## 2018-12-03 MED ORDER — ONDANSETRON HCL 4 MG/2ML IJ SOLN
INTRAMUSCULAR | Status: DC | PRN
  Administered 2018-12-03: 4 mg via INTRAVENOUS

## 2018-12-03 MED ORDER — LIDOCAINE HCL (CARDIAC) PF 100 MG/5ML IV SOSY
PREFILLED_SYRINGE | INTRAVENOUS | Status: DC | PRN
  Administered 2018-12-03: 100 mg via INTRAVENOUS

## 2018-12-03 MED ORDER — SODIUM CHLORIDE 0.9 % IR SOLN
Status: DC | PRN
  Administered 2018-12-03: 6000 mL

## 2018-12-03 MED ORDER — KETAMINE HCL 50 MG/5ML IJ SOSY
1.0000 mg/kg | PREFILLED_SYRINGE | Freq: Once | INTRAMUSCULAR | Status: DC
  Filled 2018-12-03: qty 20

## 2018-12-03 MED ORDER — BACITRACIN ZINC 500 UNIT/GM EX OINT
TOPICAL_OINTMENT | CUTANEOUS | Status: AC
  Filled 2018-12-03: qty 28.35

## 2018-12-03 SURGICAL SUPPLY — 89 items
BANDAGE ESMARK 6X9 LF (GAUZE/BANDAGES/DRESSINGS) ×1 IMPLANT
BIT DRILL 2.5 X LONG (BIT) ×1
BIT DRILL LCP QC 2X140 (BIT) ×3 IMPLANT
BIT DRILL X LONG 2.5 (BIT) ×1 IMPLANT
BNDG COHESIVE 4X5 TAN STRL (GAUZE/BANDAGES/DRESSINGS) ×3 IMPLANT
BNDG ELASTIC 4X5.8 VLCR STR LF (GAUZE/BANDAGES/DRESSINGS) ×3 IMPLANT
BNDG ELASTIC 6X5.8 VLCR STR LF (GAUZE/BANDAGES/DRESSINGS) ×3 IMPLANT
BNDG ESMARK 6X9 LF (GAUZE/BANDAGES/DRESSINGS) ×3
BNDG GAUZE ELAST 4 BULKY (GAUZE/BANDAGES/DRESSINGS) ×3 IMPLANT
BRUSH SCRUB EZ PLAIN DRY (MISCELLANEOUS) ×6 IMPLANT
CHLORAPREP W/TINT 26 (MISCELLANEOUS) ×3 IMPLANT
CLOSURE WOUND 1/2 X4 (GAUZE/BANDAGES/DRESSINGS)
COVER MAYO STAND STRL (DRAPES) ×3 IMPLANT
COVER SURGICAL LIGHT HANDLE (MISCELLANEOUS) ×6 IMPLANT
COVER WAND RF STERILE (DRAPES) ×3 IMPLANT
DRAPE C-ARM 42X72 X-RAY (DRAPES) ×3 IMPLANT
DRAPE C-ARMOR (DRAPES) ×3 IMPLANT
DRAPE IMP U-DRAPE 54X76 (DRAPES) ×6 IMPLANT
DRAPE ORTHO SPLIT 77X108 STRL (DRAPES) ×4
DRAPE SURG 17X23 STRL (DRAPES) ×3 IMPLANT
DRAPE SURG ORHT 6 SPLT 77X108 (DRAPES) ×2 IMPLANT
DRAPE U-SHAPE 47X51 STRL (DRAPES) ×3 IMPLANT
DRILL BIT X LONG 2.5 (BIT) ×2
DRSG ADAPTIC 3X8 NADH LF (GAUZE/BANDAGES/DRESSINGS) ×3 IMPLANT
DRSG MEPITEL 4X7.2 (GAUZE/BANDAGES/DRESSINGS) IMPLANT
DRSG VAC ATS MED SENSATRAC (GAUZE/BANDAGES/DRESSINGS) ×3 IMPLANT
ELECT REM PT RETURN 9FT ADLT (ELECTROSURGICAL) ×3
ELECTRODE REM PT RTRN 9FT ADLT (ELECTROSURGICAL) ×1 IMPLANT
EVACUATOR 1/8 PVC DRAIN (DRAIN) IMPLANT
GAUZE SPONGE 4X4 12PLY STRL (GAUZE/BANDAGES/DRESSINGS) ×3 IMPLANT
GAUZE SPONGE 4X4 12PLY STRL LF (GAUZE/BANDAGES/DRESSINGS) ×3 IMPLANT
GLOVE BIO SURGEON STRL SZ 6.5 (GLOVE) ×6 IMPLANT
GLOVE BIO SURGEON STRL SZ7.5 (GLOVE) ×12 IMPLANT
GLOVE BIO SURGEONS STRL SZ 6.5 (GLOVE) ×3
GLOVE BIOGEL PI IND STRL 6.5 (GLOVE) ×1 IMPLANT
GLOVE BIOGEL PI IND STRL 7.5 (GLOVE) ×1 IMPLANT
GLOVE BIOGEL PI INDICATOR 6.5 (GLOVE) ×2
GLOVE BIOGEL PI INDICATOR 7.5 (GLOVE) ×2
GOWN STRL REUS W/ TWL LRG LVL3 (GOWN DISPOSABLE) ×2 IMPLANT
GOWN STRL REUS W/TWL LRG LVL3 (GOWN DISPOSABLE) ×4
HANDPIECE INTERPULSE COAX TIP (DISPOSABLE)
KIT BASIN OR (CUSTOM PROCEDURE TRAY) ×3 IMPLANT
KIT DRSG PREVENA PLUS 7DAY 125 (MISCELLANEOUS) ×3 IMPLANT
KIT TURNOVER KIT B (KITS) ×3 IMPLANT
MANIFOLD NEPTUNE II (INSTRUMENTS) ×3 IMPLANT
NEEDLE 22X1 1/2 (OR ONLY) (NEEDLE) IMPLANT
NEEDLE HYPO 21X1.5 SAFETY (NEEDLE) IMPLANT
NEEDLE HYPO 25GX1X1/2 BEV (NEEDLE) ×3 IMPLANT
NS IRRIG 1000ML POUR BTL (IV SOLUTION) ×3 IMPLANT
PACK ORTHO EXTREMITY (CUSTOM PROCEDURE TRAY) ×3 IMPLANT
PACK TOTAL JOINT (CUSTOM PROCEDURE TRAY) ×3 IMPLANT
PAD ARMBOARD 7.5X6 YLW CONV (MISCELLANEOUS) ×6 IMPLANT
PAD CAST 4YDX4 CTTN HI CHSV (CAST SUPPLIES) IMPLANT
PADDING CAST COTTON 4X4 STRL (CAST SUPPLIES)
PADDING CAST COTTON 6X4 STRL (CAST SUPPLIES) ×6 IMPLANT
PLATE 5H 2.7 LCK LAT DIST FIB (Plate) ×3 IMPLANT
SCREW CANC FT 4.0X45 (Screw) ×3 IMPLANT
SCREW CANC FT 4.0X50 (Screw) ×3 IMPLANT
SCREW CORTEX 2.7 SLF-TPNG 16MM (Screw) ×6 IMPLANT
SCREW CORTEX 2.7 SLF-TPNG 18MM (Screw) ×3 IMPLANT
SCREW HEADED ST 3.5X60 (Screw) ×3 IMPLANT
SCREW LOCK T8 22X2.7XST VA (Screw) ×1 IMPLANT
SCREW LOCK VA ST 2.7X18 (Screw) ×12 IMPLANT
SCREW LOCK VA ST 2.7X20 (Screw) ×3 IMPLANT
SCREW LOCKING 2.7X22MM (Screw) ×2 IMPLANT
SET HNDPC FAN SPRY TIP SCT (DISPOSABLE) IMPLANT
SPONGE LAP 18X18 RF (DISPOSABLE) ×3 IMPLANT
STAPLER VISISTAT 35W (STAPLE) ×3 IMPLANT
STRIP CLOSURE SKIN 1/2X4 (GAUZE/BANDAGES/DRESSINGS) IMPLANT
SUCTION FRAZIER HANDLE 10FR (MISCELLANEOUS) ×2
SUCTION TUBE FRAZIER 10FR DISP (MISCELLANEOUS) ×1 IMPLANT
SUT ETHILON 2 0 FS 18 (SUTURE) ×6 IMPLANT
SUT ETHILON 3 0 PS 1 (SUTURE) ×6 IMPLANT
SUT MON AB 2-0 CT1 36 (SUTURE) ×9 IMPLANT
SUT PDS AB 0 CT 36 (SUTURE) IMPLANT
SUT PROLENE 0 CT (SUTURE) IMPLANT
SUT VIC AB 0 CT1 27 (SUTURE) ×2
SUT VIC AB 0 CT1 27XBRD ANBCTR (SUTURE) ×1 IMPLANT
SUT VIC AB 2-0 CT1 27 (SUTURE) ×4
SUT VIC AB 2-0 CT1 TAPERPNT 27 (SUTURE) ×2 IMPLANT
SWAB CULTURE ESWAB REG 1ML (MISCELLANEOUS) IMPLANT
SYR CONTROL 10ML LL (SYRINGE) ×3 IMPLANT
TOWEL GREEN STERILE (TOWEL DISPOSABLE) ×6 IMPLANT
TOWEL GREEN STERILE FF (TOWEL DISPOSABLE) ×6 IMPLANT
TUBE CONNECTING 12'X1/4 (SUCTIONS) ×1
TUBE CONNECTING 12X1/4 (SUCTIONS) ×2 IMPLANT
UNDERPAD 30X30 (UNDERPADS AND DIAPERS) ×3 IMPLANT
WATER STERILE IRR 1000ML POUR (IV SOLUTION) ×3 IMPLANT
YANKAUER SUCT BULB TIP NO VENT (SUCTIONS) ×3 IMPLANT

## 2018-12-03 NOTE — ED Notes (Addendum)
Obtained consent for procedural sedation and open fracture reduction. Pt on Co2 and cardiac monitor. Resp tech at bedside, MD at bedside and crash cart at bedside.

## 2018-12-03 NOTE — ED Notes (Signed)
Ortho tech paged  

## 2018-12-03 NOTE — Anesthesia Postprocedure Evaluation (Signed)
Anesthesia Post Note  Patient: Jean Stewart  Procedure(s) Performed: IRRIGATION AND DEBRIDEMENT EXTREMITY (Left Ankle) EXTERNAL FIXATION LEG (Left Ankle) OPEN REDUCTION INTERNAL FIXATION (ORIF) ANKLE FRACTURE (Left Ankle)     Patient location during evaluation: PACU Anesthesia Type: General Level of consciousness: sedated and patient cooperative Pain management: pain level controlled Vital Signs Assessment: post-procedure vital signs reviewed and stable Respiratory status: spontaneous breathing Cardiovascular status: stable Anesthetic complications: no    Last Vitals:  Vitals:   12/03/18 1858 12/03/18 2115  BP: (!) 145/89 119/90  Pulse: 85 87  Resp: 16 18  Temp: (!) 36.3 C 36.7 C  SpO2: 99% 100%    Last Pain:  Vitals:   12/03/18 1858  TempSrc: Oral  PainSc:                  [name]

## 2018-12-03 NOTE — Progress Notes (Signed)
[name] [id number]  Code Status: Awaiting order  Admission Data: 12/03/2018 7:21 PM   Jean Stewart is a 73 y.o. female patient admitted from PACU awake, alert - oriented X 4 - no acute distress noted. VSS - Blood pressure (!) 145/89, pulse 85, temperature (!) 97.3 F (36.3 C), temperature source Oral, resp. rate 16, height 5' 5.5" (1.664 m), weight (!) 183.3 kg, SpO2 99 %. no c/o shortness of breath, no c/o chest pain. Admission INP armband ID verified with patient/family, and in place.  Patient and family able to verbalize understanding of risk associated with falls, and verbalized understanding to call nsg before up out of bed. Call light within reach, patient able to voice, and demonstrate understanding. Wound vac to left ankle. ?  Will cont to eval and treat per MD orders.  Melonie Florida, RN  12/03/2018 7:21 PM

## 2018-12-03 NOTE — ED Notes (Signed)
Obtained consent for procedure. Pt's purple gown placed in belonging bag and sent with her to short stay along with consents

## 2018-12-03 NOTE — ED Triage Notes (Addendum)
Pt here from home for open L ankle fracture after fall getting out of bed this morning. Pt got her foot caught on and tripped. No LOC. 50 mcg Fentanyl and 2 g ancef given PTA. Pedal pulse found with doppler on arrival. Pt taking Coumadin for afib.

## 2018-12-03 NOTE — Consult Note (Signed)
Reason for Consult:Open left ankle fx Referring Physician: Robyn Haber  Jean Stewart is an 73 y.o. female.  HPI: Jean Stewart was getting up from bed to go to the potty this morning. Her daughter was helping her. She felt like her foot wasn't turning and when she got to her feet she had severe pain to the ankle and the bone was protruding from the skin. She was brought to the ED for evaluation and orthopedic surgery was consulted. She c/o localized pain to the area.  Past Medical History:  Diagnosis Date  . A-fib ([place])   . COPD (chronic obstructive pulmonary disease) ([place])   . Hypertension   . Liver failure ([place])   . Morbid obesity ([place])   . Neuropathy   . Physical deconditioning   . Suspected sleep apnea     No past surgical history on file.  Family History  Problem Relation Age of Onset  . CAD Mother        MI at age 41    Social History:  reports that she has quit smoking. She has never used smokeless tobacco. She reports previous alcohol use. She reports that she does not use drugs.  Allergies: No Known Allergies  Medications: I have reviewed the patient's current medications.  Results for orders placed or performed during the hospital encounter of 12/03/18 (from the past 48 hour(s))  Sample to Blood Bank     Status: None   Collection Time: 12/03/18 10:00 AM  Result Value Ref Range   Blood Bank Specimen SAMPLE AVAILABLE FOR TESTING    Sample Expiration      12/04/2018,2359 Performed at [facility], [place] [address] [place], [place] [address]   Protime-INR     Status: Abnormal   Collection Time: 12/03/18 10:00 AM  Result Value Ref Range   Prothrombin Time 27.3 (H) 11.4 - 15.2 seconds   INR 2.6 (H) 0.8 - 1.2    Comment: (NOTE) INR goal varies based on device and disease states. Performed at [facility], [place] [address], [place], [place] [address]   CBC with Differential     Status: Abnormal   Collection Time: 12/03/18 10:02 AM  Result Value Ref Range    WBC 5.5 4.0 - 10.5 K/uL   RBC 3.63 (L) 3.87 - 5.11 MIL/uL   Hemoglobin 11.4 (L) 12.0 - 15.0 g/dL   HCT 36.8 36.0 - 46.0 %   MCV 101.4 (H) 80.0 - 100.0 fL   MCH 31.4 26.0 - 34.0 pg   MCHC 31.0 30.0 - 36.0 g/dL   RDW 13.4 11.5 - 15.5 %   Platelets 143 (L) 150 - 400 K/uL    Comment: REPEATED TO VERIFY   nRBC 0.0 0.0 - 0.2 %   Neutrophils Relative % 57 %   Neutro Abs 3.1 1.7 - 7.7 K/uL   Lymphocytes Relative 36 %   Lymphs Abs 2.0 0.7 - 4.0 K/uL   Monocytes Relative 3 %   Monocytes Absolute 0.2 0.1 - 1.0 K/uL   Eosinophils Relative 3 %   Eosinophils Absolute 0.2 0.0 - 0.5 K/uL   Basophils Relative 0 %   Basophils Absolute 0.0 0.0 - 0.1 K/uL   Immature Granulocytes 1 %   Abs Immature Granulocytes 0.03 0.00 - 0.07 K/uL    Comment: Performed at [facility], [place] [address], [place], [place] [address]    Dg Chest Portable 1 View  Result Date: 12/03/2018 CLINICAL DATA:  Pain status post fall  EXAM: PORTABLE CHEST 1 VIEW COMPARISON:  May 13, 2018 FINDINGS: The heart size is enlarged. There is no pneumothorax. No large pleural effusion. There is no displaced fracture. No focal infiltrate. IMPRESSION: Cardiomegaly without evidence for an acute process. Electronically Signed   By: Constance Holster M.D.   On: 12/03/2018 10:40   Dg Ankle Left Port  Result Date: 12/03/2018 CLINICAL DATA:  Left ankle injury in a fall this morning. Initial encounter. EXAM: PORTABLE LEFT ANKLE - 2 VIEW COMPARISON:  None. FINDINGS: The talus is laterally dislocated out of the tibiotalar joint. There is a transverse fracture through the lateral malleolus. Large skin laceration is identified medially. Bones appear osteopenic. Hindfoot and midfoot osteoarthritis noted. IMPRESSION: Lateral dislocation of the talus out of the tibiotalar joint with a transverse fracture through the lateral malleolus and a large skin laceration over the medial malleolus. Electronically Signed   By: Inge Rise M.D.   On:  12/03/2018 10:40    Review of Systems  Constitutional: Negative for weight loss.  HENT: Negative for ear discharge, ear pain, hearing loss and tinnitus.   Eyes: Negative for blurred vision, double vision, photophobia and pain.  Respiratory: Negative for cough, sputum production and shortness of breath.   Cardiovascular: Negative for chest pain.  Gastrointestinal: Negative for abdominal pain, nausea and vomiting.  Genitourinary: Negative for dysuria, flank pain, frequency and urgency.  Musculoskeletal: Positive for joint pain (Left ankle). Negative for back pain, falls, myalgias and neck pain.  Neurological: Negative for dizziness, tingling, sensory change, focal weakness, loss of consciousness and headaches.  Endo/Heme/Allergies: Does not bruise/bleed easily.  Psychiatric/Behavioral: Negative for depression, memory loss and substance abuse. The patient is not nervous/anxious.    Blood pressure (!) 124/111, pulse (!) 103, temperature 97.8 F (36.6 C), temperature source Oral, resp. rate 16, height 5' 5.5" (1.664 m), weight (!) 183.3 kg, SpO2 98 %. Physical Exam  Constitutional: She appears well-developed and well-nourished. No distress.  HENT:  Head: Normocephalic and atraumatic.  Eyes: Conjunctivae are normal. Right eye exhibits no discharge. Left eye exhibits no discharge. No scleral icterus.  Neck: Normal range of motion.  Cardiovascular: Normal rate and regular rhythm.  Respiratory: Effort normal. No respiratory distress.  Musculoskeletal:     Comments: LLE Medially open ankle fracture, noecchymosis or rash  Mod TTP  No knee or ankle effusion  Knee stable to varus/ valgus and anterior/posterior stress  Sens DPN, SPN, TN intact  Motor EHL, ext, flex, evers 5/5  DP 2+, No significant edema  Neurological: She is alert.  Skin: Skin is warm and dry. She is not diaphoretic.  Psychiatric: She has a normal mood and affect. Her behavior is normal.    Assessment/Plan: Open left ankle  fx -- Will reduce and splint. Plan for I&D and ex fix vs ORIF later today by Dr. Doreatha Martin. NPO until then. Afib on coumadin -- Awaiting INR, will give vitamin K if over 2 Multiple medical problems including HTN, morbid obesity, and COPD -- per IM who will admit. Appreciate their help.    Lisette Abu, PA-C Orthopedic Surgery [phone] 12/03/2018, 11:30 AM

## 2018-12-03 NOTE — Anesthesia Procedure Notes (Signed)
Procedure Name: Intubation Date/Time: 12/03/2018 3:08 PM Performed by: Jenne Campus, CRNA Pre-anesthesia Checklist: Patient identified, Emergency Drugs available, Suction available and Patient being monitored Patient Re-evaluated:Patient Re-evaluated prior to induction Oxygen Delivery Method: Circle System Utilized Preoxygenation: Pre-oxygenation with 100% oxygen Induction Type: IV induction Ventilation: Mask ventilation without difficulty Laryngoscope Size: Miller and 2 Grade View: Grade II Tube type: Oral Number of attempts: 1 Airway Equipment and Method: Stylet Placement Confirmation: ETT inserted through vocal cords under direct vision,  positive ETCO2 and breath sounds checked- equal and bilateral Tube secured with: Tape Dental Injury: Teeth and Oropharynx as per pre-operative assessment

## 2018-12-03 NOTE — Anesthesia Preprocedure Evaluation (Addendum)
Anesthesia Evaluation  Patient identified by MRN, date of birth, ID band Patient awake    Reviewed: Allergy & Precautions, NPO status , Patient's Chart, lab work & pertinent test results  Airway Mallampati: II  TM Distance: >3 FB Neck ROM: Full    Dental no notable dental hx.    Pulmonary COPD, former smoker,    Pulmonary exam normal breath sounds clear to auscultation       Cardiovascular hypertension, Pt. on medications negative cardio ROS Normal cardiovascular exam Rhythm:Regular Rate:Normal     Neuro/Psych negative neurological ROS  negative psych ROS   GI/Hepatic negative GI ROS, Neg liver ROS,   Endo/Other  Morbid obesity  Renal/GU Renal InsufficiencyRenal disease  negative genitourinary   Musculoskeletal negative musculoskeletal ROS (+)   Abdominal (+) + obese,   Peds negative pediatric ROS (+)  Hematology negative hematology ROS (+)   Anesthesia Other Findings   Reproductive/Obstetrics negative OB ROS                             Anesthesia Physical Anesthesia Plan  ASA: IV  Anesthesia Plan: General   Post-op Pain Management:    Induction: Intravenous  PONV Risk Score and Plan: Ondansetron, Dexamethasone, Midazolam and Treatment may vary due to age or medical condition  Airway Management Planned: Oral ETT  Additional Equipment:   Intra-op Plan:   Post-operative Plan: Extubation in OR  Informed Consent: I have reviewed the patients History and Physical, chart, labs and discussed the procedure including the risks, benefits and alternatives for the proposed anesthesia with the patient or authorized representative who has indicated his/her understanding and acceptance.     Dental advisory given  Plan Discussed with: CRNA  Anesthesia Plan Comments:        Anesthesia Quick Evaluation

## 2018-12-03 NOTE — ED Provider Notes (Signed)
[facility] EMERGENCY DEPARTMENT Provider Note   CSN: [id number] Arrival date & time: 12/03/18  0944     History   Chief Complaint Chief Complaint  Patient presents with  . Ankle Injury    HPI Israel is a 73 y.o. female.     HPI Patient presents to the emergency department with injury to the right ankle.  The patient states that she twisted her foot and ankle landing on her left ankle.  The patient states that she noticed immediate bleeding from her wound on her ankle.  Patient states that she was unable to get up afterwards.  Patient states that they called EMS and applied a towel to the area since there was bleeding. Past Medical History:  Diagnosis Date  . A-fib ([place])   . COPD (chronic obstructive pulmonary disease) ([place])   . Hypertension   . Liver failure ([place])   . Morbid obesity ([place])   . Neuropathy   . Physical deconditioning   . Suspected sleep apnea     Patient Active Problem List   Diagnosis Date Noted  . Pressure injury of skin 05/12/2018  . Atrial fibrillation with RVR ([place]) 05/11/2018  . Obesity, Class III, BMI 40-49.9 (morbid obesity) ([place]) 05/11/2018  . Essential hypertension 05/11/2018  . Renal insufficiency 05/11/2018  . New onset a-fib ([place]) 05/11/2018  . Acute respiratory failure with hypoxia and hypercapnia ([place]) 05/11/2018  . Elevated troponin 05/11/2018    No past surgical history on file.   OB History   No obstetric history on file.      Home Medications    Prior to Admission medications   Medication Sig Start Date End Date Taking? Authorizing Provider  acetaminophen (TYLENOL) 500 MG tablet Take 1,000 mg by mouth every 6 (six) hours as needed for mild pain.    [provider]  docusate sodium (COLACE) 100 MG capsule Take 200 mg by mouth daily as needed for mild constipation.    [provider]  furosemide (LASIX) 40 MG tablet Take 1 tablet (40 mg total) by mouth 2 (two) times daily.  05/19/18   Dessa Phi, DO  hydrocortisone 2.5 % cream Apply 1 application topically 2 (two) times daily as needed for itching. 01/20/18   [provider]  metoprolol succinate (TOPROL-XL) 100 MG 24 hr tablet Take 1 tablet (100 mg total) by mouth daily. Take with or immediately following a meal. 05/20/18   Dessa Phi, DO  warfarin (COUMADIN) 5 MG tablet Take 1 tablet (5 mg total) by mouth daily. 05/19/18   Dessa Phi, DO    Family History Family History  Problem Relation Age of Onset  . CAD Mother        MI at age 14    Social History Social History   Tobacco Use  . Smoking status: Former Research scientist (life sciences)  . Smokeless tobacco: Never Used  Substance Use Topics  . Alcohol use: Not Currently  . Drug use: Never     Allergies   Patient has no known allergies.   Review of Systems Review of Systems All other systems negative except as documented in the HPI. All pertinent positives and negatives as reviewed in the HPI.  Physical Exam Updated Vital Signs BP (!) 152/88   Pulse (!) 103   Temp 97.8 F (36.6 C) (Oral)   Resp (!) 6   Ht 5' 5.5" (1.664 m)   Wt (!) 183.3 kg   SpO2 100%   BMI 66.21 kg/m  Physical Exam Vitals signs and nursing note reviewed.  Constitutional:      General: She is not in acute distress.    Appearance: She is well-developed.  HENT:     Head: Normocephalic and atraumatic.  Eyes:     Pupils: Pupils are equal, round, and reactive to light.  Cardiovascular:     Rate and Rhythm: Normal rate and regular rhythm.  Pulmonary:     Effort: Pulmonary effort is normal.  Musculoskeletal:       Feet:  Skin:    General: Skin is warm and dry.  Neurological:     Mental Status: She is alert and oriented to person, place, and time.      ED Treatments / Results  Labs (all labs ordered are listed, but only abnormal results are displayed) Labs Reviewed  CBC WITH DIFFERENTIAL/PLATELET - Abnormal; Notable for the following components:      Result  Value   RBC 3.63 (*)    Hemoglobin 11.4 (*)    MCV 101.4 (*)    Platelets 143 (*)    All other components within normal limits  PROTIME-INR - Abnormal; Notable for the following components:   Prothrombin Time 27.3 (*)    INR 2.6 (*)    All other components within normal limits  SARS CORONAVIRUS 2 (HOSPITAL ORDER, [facility])  BASIC METABOLIC PANEL  SAMPLE TO BLOOD BANK    EKG None  Radiology Dg Chest Portable 1 View  Result Date: 12/03/2018 CLINICAL DATA:  Pain status post fall EXAM: PORTABLE CHEST 1 VIEW COMPARISON:  May 13, 2018 FINDINGS: The heart size is enlarged. There is no pneumothorax. No large pleural effusion. There is no displaced fracture. No focal infiltrate. IMPRESSION: Cardiomegaly without evidence for an acute process. Electronically Signed   By: Constance Holster M.D.   On: 12/03/2018 10:40   Dg Ankle Left Port  Result Date: 12/03/2018 CLINICAL DATA:  Left ankle fracture EXAM: PORTABLE LEFT ANKLE - 2 VIEW COMPARISON:  Earlier same day. FINDINGS: Oblique displaced distal fibular metaphysis fracture 7 mm of lateral displacement. Interval improvement in tibiotalar dislocation with persistent 6 mm of lateral subluxation of the talar dome relative to the tibial plafond. Mild osteoarthritis of the tibiotalar joint. Breast no aggressive osseous lesion. IMPRESSION: Oblique displaced distal fibular metaphysis fracture 7 mm of lateral displacement. Interval improvement in tibiotalar dislocation with persistent 6 mm of lateral subluxation of the talar dome relative to the tibial plafond. Electronically Signed   By: Kathreen Devoid   On: 12/03/2018 12:48   Dg Ankle Left Port  Result Date: 12/03/2018 CLINICAL DATA:  Left ankle injury in a fall this morning. Initial encounter. EXAM: PORTABLE LEFT ANKLE - 2 VIEW COMPARISON:  None. FINDINGS: The talus is laterally dislocated out of the tibiotalar joint. There is a transverse fracture through the lateral  malleolus. Large skin laceration is identified medially. Bones appear osteopenic. Hindfoot and midfoot osteoarthritis noted. IMPRESSION: Lateral dislocation of the talus out of the tibiotalar joint with a transverse fracture through the lateral malleolus and a large skin laceration over the medial malleolus. Electronically Signed   By: Inge Rise M.D.   On: 12/03/2018 10:40    Procedures Procedures (including critical care time)  Medications Ordered in ED Medications  ketamine 50 mg in normal saline 5 mL (10 mg/mL) syringe (183 mg Intravenous Not Given 12/03/18 1204)  ketamine (KETALAR) injection (50 mg Intravenous Given 12/03/18 1156)     Initial Impression / Assessment  and Plan / ED Course  I have reviewed the triage vital signs and the nursing notes.  Pertinent labs & imaging results that were available during my care of the patient were reviewed by me and considered in my medical decision making (see chart for details).        Spoke with Hilbert Odor of orthopedics who will evaluate the patient and he will reduce the ankle and prepped for the OR.  Patient is advised of the plan and all questions were answered.  She was given Ancef 2 g en route by EMS.   CRITICAL CARE Performed by: Resa Miner Rashika Bettes Total critical care time: 30 minutes Critical care time was exclusive of separately billable procedures and treating other patients. Critical care was necessary to treat or prevent imminent or life-threatening deterioration. Critical care was time spent personally by me on the following activities: development of treatment plan with patient and/or surrogate as well as nursing, discussions with consultants, evaluation of patient's response to treatment, examination of patient, obtaining history from patient or surrogate, ordering and performing treatments and interventions, ordering and review of laboratory studies, ordering and review of radiographic studies, pulse oximetry and  re-evaluation of patient's condition.  Final Clinical Impressions(s) / ED Diagnoses   Final diagnoses:  Fall  Fracture    ED Discharge Orders    None       Dalia Heading, PA-C 12/04/18 [place], Nathan, MD 12/05/18 [phone]

## 2018-12-03 NOTE — Transfer of Care (Signed)
Immediate Anesthesia Transfer of Care Note  Patient: Jean Stewart  Procedure(s) Performed: IRRIGATION AND DEBRIDEMENT EXTREMITY (Left Ankle) EXTERNAL FIXATION LEG (Left Ankle) OPEN REDUCTION INTERNAL FIXATION (ORIF) ANKLE FRACTURE (Left Ankle)  Patient Location: PACU  Anesthesia Type:General  Level of Consciousness: awake, drowsy and patient cooperative  Airway & Oxygen Therapy: Patient Spontanous Breathing and Patient connected to nasal cannula oxygen  Post-op Assessment: Report given to RN and Post -op Vital signs reviewed and stable  Post vital signs: Reviewed and stable  Last Vitals:  Vitals Value Taken Time  BP    Temp    Pulse    Resp    SpO2      Last Pain:  Vitals:   12/03/18 0957  TempSrc: Oral  PainSc:          Complications: No apparent anesthesia complications

## 2018-12-03 NOTE — Op Note (Signed)
Orthopaedic Surgery Operative Note (CSN: [id number] ) Date of Surgery: 12/03/2018  Admit Date: 12/03/2018   Diagnoses: Pre-Op Diagnoses: Left type IIIA open trimalleolar ankle fracture dislocation   Post-Op Diagnosis: Same  Procedures: 1. CPT 11012-Irrigation and debridement of left open ankle fracture 2. CPT A215606 reduction internal fixation of left trimalleolar ankle fracture 3. CPT 20690-External fixation of left ankle  Surgeons : Primary: Shona Needles, MD  Assistant: Patrecia Pace, PA-C  Location: OR 3   Anesthesia:General  Antibiotics: Ancef 3g preop   Tourniquet time: None used  Estimated Blood AB-123456789 mL  Complications:* No complications entered in OR log *   Specimens:* No specimens in log *   Implants: Implant Name Type Inv. Item Serial No. Manufacturer Lot No. LRB No. Used Action  SCREW LOCKING 2.7X22MM - [id number] Screw SCREW LOCKING 2.7X22MM  [employer]  Left 1 Implanted  SCREW LOCKING 2.7X20MM - [id number] Screw SCREW LOCKING 2.7X20MM  [employer]  Left 1 Implanted  SCREW LOCKING 2.7X18MM - [id number] Screw SCREW LOCKING 2.7X18MM  [employer]  Left 4 Implanted  PLATE 5H 2.7 LCK LAT DIST FIB - [id number] Plate PLATE 5H 2.7 LCK LAT DIST FIB  [employer]  Left 1 Implanted  SCREW CORTEX 2.7 SLF-TPNG 16MM - [id number] Screw SCREW CORTEX 2.7 SLF-TPNG 16MM  [employer]  Left 2 Implanted  SCREW CORTEX 2.7 SLF-TPNG 18MM - [id number] Screw SCREW CORTEX 2.7 SLF-TPNG 18MM  [employer]  Left 1 Implanted  SCREW CANCEL 4.0 45MM - [id number] Screw SCREW CANCEL 4.0 45MM  [employer]  Left 1 Implanted  SCREW CANCEL 4.0 50MM - [id number] Screw SCREW CANCEL 4.0 50MM  [employer]  Left 1 Implanted     Indications for Surgery: 73 year old female with a history of atrial fibrillation on Coumadin, morbid obesity, hypertension and COPD that presents after she had fallen while trying to go to the bathroom this morning.  She has a open ankle fracture  dislocation.  Her INR today was 2.6.  We will plan to proceed urgently for irrigation debridement with possible open reduction internal fixation versus external fixation.  I discussed risks and benefits with the patient.  She is at high risk for complications due to her medical comorbidities.  Risks included but not limited to bleeding, infection, malunion, nonunion, posttraumatic arthritis, stiffness, nerve and blood vessel injury, hardware failure, need for revision surgery, DVT, compartment syndrome, even the possibility of amputation or anesthetic complications.  She agreed to proceed with surgery and consent was obtained.  Operative Findings: 1.  Type IIIA open left trimalleolar ankle fracture dislocation treated with irrigation debridement 2.  Open reduction internal fixation of the left trimalleolar ankle fracture using a Synthes VA distal fibular locking plate with 4.0 mm cancellus screws for syndesmotic fixation. 3.  Medial malleolus avulsion with no significant bone to provide fixation with the ankle still being highly unstable after fixation of the lateral malleolus. 4.  External fixation with a spanning delta frame using Synthes large external fixator to stabilize the ankle mortise  Procedure: The patient was identified in the preoperative holding area. Consent was confirmed with the patient and their family and all questions were answered. The operative extremity was marked after confirmation with the patient. she was then brought back to the operating room by our anesthesia colleagues.  She was placed under general anesthetic and carefully transferred over to a radiolucent flat top table.  A bump was placed under her operative hip. The operative extremity was then prepped and draped  in usual sterile fashion. A preoperative timeout was performed to verify the patient, the procedure, and the extremity. Preoperative antibiotics were dosed.  She had a medial transverse laceration approximately  11cm along the distal ankle.  The bone was exposed.  It was highly unstable.  I first started out by excising the skin edges with a 10 blade.  I debrided some of the fat and subcutaneous tissue.  I delivered the distal tibia through the wound.  I then debrided the tibia and removed all loose bone.  I used a curette to debride the fracture as well.  The fracture was more of an avulsion however felt there was still some medial malleolus remaining on the deltoid ligament.  I used a low pressure pulsatile lavage to thoroughly irrigate the wound with approximately 6 L of normal saline.  Once the debridement was completed I changed my gloves and instruments and turned my attention to the fixation portion of the procedure.  Patient was not very swollen due to her body habitus she had a lot of play in her soft tissues so I felt that fixation would be appropriate.  I made a direct lateral approach the distal fibula carried it down through skin subcutaneous tissue.  I exposed the fracture site and reduce this anatomically.  I chose a Synthes VA distal fibular locking plate and provisionally fixed it to the distal fibula.  I then placed 4 locking screws in the distal segment.  I then reduced the fibular shaft to the plate and placed 3 nonlocking screws in the fibula.  Good fixation was obtained.  Her bone was poor and I felt that I needed to reinforce this with fixation on the medial side.  A unicortical drill hole was made in the metaphysis and a tenaculum clamp was used to attempt to reduce the medial malleolus.  Unfortunately the remainder of the fragments were so comminuted as well as there is not a large medial malleolus fragment to fully fix and provide good fixation with her poor bone quality.  An attempt was made to place a 3.5 mm nonlocking screw to hold the fixation however this did not gain much fixation due to her poor bone.  This was removed and I felt that at this point the bone was too poor and too  comminuted to provide any decent fixation.  I returned to the lateral side reduce the syndesmosis and placed a 4.0 mm cancellus screws across the syndesmosis to hold the fibula and tibia in place.  Even after the syndesmotic fixation the ankle was still unstable due to the posterior malleolus and medial malleolus being avulsion fractures.  The talus still had rotational instability and I felt that an external fixation was needed to provide enough stability for the ankle mortise.  I predrilled through percutaneous incisions bicortically through the tibia and placed 5.0 mm threaded half pins.  I connected these to a pin clamp.  I then made a percutaneous incision along the medial heel and I placed a 6.0 mm centrally threaded pin for the trans-calcaneus fixation.  The incisions were then irrigated once more.  A layer closure of 2-0 Monocryl and 3-0 nylon was used.  An incisional wound VAC was then placed to the incisions.  I then performed a reduction maneuver of the talus under fluoroscopy and was able to get anatomic reduction on the AP and lateral view.  The external fixation device was final tightened after 11 mm bars were connected from the tibial pins to  the trans-calcaneal pin.   Final fluoroscopic images were obtained.  The leg was dressed with a Kerlix around the pin sites and a compressive dressing.  The patient was then awoken from anesthesia and taken to the PACU in stable condition.  Post Op Plan/Instructions: Patient will be nonweightbearing to the left lower extremity.  She will receive postoperative ceftriaxone for open fracture prophylaxis for 48 hours.  She will be restarted on her Coumadin postoperative day 1.  She will mobilize with physical and Occupational Therapy.  I do not plan any other surgeries in the near future.  I was present and performed the entire surgery.  Patrecia Pace, PA-C did assist me throughout the case. An assistant was necessary given the difficulty in approach,  maintenance of reduction and ability to instrument the fracture.   Katha Hamming, MD [facility]

## 2018-12-03 NOTE — Sedation Documentation (Signed)
Ortho PA reduced left open ankle fracture, ortho tech at bedside to place short leg splint on left ankle.

## 2018-12-03 NOTE — Progress Notes (Signed)
Orthopedic Tech Progress Note Patient Details:  Jean Stewart 08/11/1945 [id number]  Ortho Devices Type of Ortho Device: Short leg splint, Stirrup splint Ortho Device/Splint Location: left Ortho Device/Splint Interventions: Application   Post Interventions Patient Tolerated: Well Instructions Provided: Care of device   [place] Pink 12/03/2018, 11:38 AM

## 2018-12-04 ENCOUNTER — Encounter (HOSPITAL_COMMUNITY): Payer: Self-pay | Admitting: Student

## 2018-12-04 DIAGNOSIS — D638 Anemia in other chronic diseases classified elsewhere: Secondary | ICD-10-CM

## 2018-12-04 DIAGNOSIS — N183 Chronic kidney disease, stage 3 (moderate): Secondary | ICD-10-CM

## 2018-12-04 DIAGNOSIS — I10 Essential (primary) hypertension: Secondary | ICD-10-CM

## 2018-12-04 DIAGNOSIS — S82853C Displaced trimalleolar fracture of unspecified lower leg, initial encounter for open fracture type IIIA, IIIB, or IIIC: Secondary | ICD-10-CM | POA: Diagnosis present

## 2018-12-04 DIAGNOSIS — I5040 Unspecified combined systolic (congestive) and diastolic (congestive) heart failure: Secondary | ICD-10-CM | POA: Diagnosis present

## 2018-12-04 DIAGNOSIS — I4821 Permanent atrial fibrillation: Secondary | ICD-10-CM

## 2018-12-04 DIAGNOSIS — S82852C Displaced trimalleolar fracture of left lower leg, initial encounter for open fracture type IIIA, IIIB, or IIIC: Principal | ICD-10-CM

## 2018-12-04 DIAGNOSIS — Z7901 Long term (current) use of anticoagulants: Secondary | ICD-10-CM

## 2018-12-04 DIAGNOSIS — Z6841 Body Mass Index (BMI) 40.0 and over, adult: Secondary | ICD-10-CM

## 2018-12-04 DIAGNOSIS — S82899B Other fracture of unspecified lower leg, initial encounter for open fracture type I or II: Secondary | ICD-10-CM | POA: Diagnosis present

## 2018-12-04 DIAGNOSIS — I5042 Chronic combined systolic (congestive) and diastolic (congestive) heart failure: Secondary | ICD-10-CM

## 2018-12-04 LAB — CBC
HCT: 34.3 % — ABNORMAL LOW (ref 36.0–46.0)
Hemoglobin: 11 g/dL — ABNORMAL LOW (ref 12.0–15.0)
MCH: 31.7 pg (ref 26.0–34.0)
MCHC: 32.1 g/dL (ref 30.0–36.0)
MCV: 98.8 fL (ref 80.0–100.0)
Platelets: 143 10*3/uL — ABNORMAL LOW (ref 150–400)
RBC: 3.47 MIL/uL — ABNORMAL LOW (ref 3.87–5.11)
RDW: 13.4 % (ref 11.5–15.5)
WBC: 8.9 10*3/uL (ref 4.0–10.5)
nRBC: 0 % (ref 0.0–0.2)

## 2018-12-04 LAB — VITAMIN D 25 HYDROXY (VIT D DEFICIENCY, FRACTURES): Vit D, 25-Hydroxy: 9.4 ng/mL — ABNORMAL LOW (ref 30.0–100.0)

## 2018-12-04 MED ORDER — METOPROLOL SUCCINATE ER 100 MG PO TB24
100.0000 mg | ORAL_TABLET | Freq: Every day | ORAL | Status: DC
  Administered 2018-12-04 – 2018-12-10 (×7): 100 mg via ORAL
  Filled 2018-12-04 (×7): qty 1

## 2018-12-04 MED ORDER — WARFARIN - PHARMACIST DOSING INPATIENT
Freq: Every day | Status: DC
  Administered 2018-12-06 – 2018-12-09 (×3)

## 2018-12-04 MED ORDER — VITAMIN D (ERGOCALCIFEROL) 1.25 MG (50000 UNIT) PO CAPS
50000.0000 [IU] | ORAL_CAPSULE | ORAL | Status: DC
  Administered 2018-12-04: 09:00:00 50000 [IU] via ORAL
  Filled 2018-12-04 (×2): qty 1

## 2018-12-04 MED ORDER — WARFARIN SODIUM 7.5 MG PO TABS
7.5000 mg | ORAL_TABLET | Freq: Once | ORAL | Status: AC
  Administered 2018-12-04: 7.5 mg via ORAL
  Filled 2018-12-04: qty 1

## 2018-12-04 MED ORDER — GABAPENTIN 100 MG PO CAPS
100.0000 mg | ORAL_CAPSULE | Freq: Every day | ORAL | Status: DC | PRN
  Filled 2018-12-04: qty 1

## 2018-12-04 MED ORDER — FUROSEMIDE 40 MG PO TABS: 40.0000 mg | ORAL_TABLET | ORAL | Status: DC

## 2018-12-04 MED ORDER — DOCUSATE SODIUM 100 MG PO CAPS
200.0000 mg | ORAL_CAPSULE | Freq: Every day | ORAL | Status: DC | PRN
  Administered 2018-12-08: 13:00:00 200 mg via ORAL
  Filled 2018-12-04: qty 2

## 2018-12-04 MED ORDER — VITAMIN D 25 MCG (1000 UNIT) PO TABS
5000.0000 [IU] | ORAL_TABLET | Freq: Every day | ORAL | Status: DC
  Administered 2018-12-04 – 2018-12-10 (×7): 5000 [IU] via ORAL
  Filled 2018-12-04 (×7): qty 5

## 2018-12-04 NOTE — Evaluation (Signed)
Physical Therapy Evaluation Patient Details Name: Jean Stewart MRN: [id number] DOB: Nov 09, 1945 Today's Date: 12/04/2018   History of Present Illness  Patient is a 73 year old female admitted after fall with L ankle fracture. PMH includes: HTN, morbid obesity, Afib, COPD.  Clinical Impression  Patient received in bed, pleasant, reports no pain currently. Patient agrees to PT/OT session. Daughter arrived during treatment. Patient has limited mobility at baseline, reports she was just now beginning to get up to [facility]. She has her daughter's help at home. She requires min/mod assist for supine to sit with use of bed rails and head of bed elevated. She performed scooting in bed with mod +2 assist. Once she began moving over to recliner the recliner shifted over and she was unable to assist more with scooting therefore NT was called to assist. With +4 assist she was able to get fully over to recliner. Patient will benefit from continued skilled PT to address her weakness, functional limitations.      Follow Up Recommendations SNF;Supervision for mobility/OOB    Equipment Recommendations  None recommended by PT    Recommendations for Other Services       Precautions / Restrictions Precautions Precautions: Fall Precaution Comments: external fixator on L LE Restrictions Weight Bearing Restrictions: Yes LLE Weight Bearing: Non weight bearing      Mobility  Bed Mobility Overal bed mobility: Needs Assistance Bed Mobility: Supine to Sit     Supine to sit: Mod assist;+2 for physical assistance     General bed mobility comments: Patient is able to assist with supine to sit. Requires assistance to move L LE off bed. Max cues.  Transfers Overall transfer level: Needs assistance   Transfers: Lateral/Scoot Transfers          Lateral/Scoot Transfers: +2 physical assistance;From elevated surface;Max assist General transfer comment: Patient got about halfway from bed to recliner and  recliner was scooting away from bed, therefore called NT to come assist. Patient was ultimately able to get all the way to recliner with +4 assist. Cues needed to lean forward instead of back during scooting.  Ambulation/Gait                Stairs            Wheelchair Mobility    Modified Rankin (Stroke Patients Only)       Balance Overall balance assessment: Needs assistance Sitting-balance support: Feet supported;Bilateral upper extremity supported Sitting balance-Leahy Scale: Fair                                       Pertinent Vitals/Pain Pain Assessment: No/denies pain    Home Living Family/patient expects to be discharged to:: Skilled nursing facility Living Arrangements: Children Available Help at Discharge: Family;Available PRN/intermittently Type of Home: House Home Access: Level entry     Home Layout: One level Home Equipment: Walker - 2 wheels      Prior Function Level of Independence: Needs assistance   Gait / Transfers Assistance Needed: used RW for very short trips, states she just started getting up to [facility]. So limited mobility at baseline.  ADL's / Homemaking Assistance Needed: Had family to assist her with house        Hand Dominance   Dominant Hand: Right    Extremity/Trunk Assessment   Upper Extremity Assessment Upper Extremity Assessment: Defer to OT evaluation    Lower Extremity Assessment  Lower Extremity Assessment: Generalized weakness LLE Sensation: WNL    Cervical / Trunk Assessment Cervical / Trunk Assessment: Normal  Communication   Communication: No difficulties  Cognition Arousal/Alertness: Awake/alert Behavior During Therapy: WFL for tasks assessed/performed Overall Cognitive Status: Within Functional Limits for tasks assessed                                        General Comments      Exercises     Assessment/Plan    PT Assessment Patient needs continued PT services   PT Problem List Decreased strength;Decreased mobility;Decreased safety awareness;Decreased activity tolerance;Decreased balance;Obesity;Decreased knowledge of precautions       PT Treatment Interventions Therapeutic activities;Therapeutic exercise;Patient/family education;Balance training;Functional mobility training    PT Goals (Current goals can be found in the Care Plan section)  Acute Rehab PT Goals Patient Stated Goal: none stated Time For Goal Achievement: 12/18/18 Potential to Achieve Goals: Fair    Frequency Min 3X/week   Barriers to discharge Decreased caregiver support      Co-evaluation               AM-PAC PT "6 Clicks" Mobility  Outcome Measure Help needed turning from your back to your side while in a flat bed without using bedrails?: A Lot Help needed moving from lying on your back to sitting on the side of a flat bed without using bedrails?: A Lot Help needed moving to and from a bed to a chair (including a wheelchair)?: Total Help needed standing up from a chair using your arms (e.g., wheelchair or bedside chair)?: Total Help needed to walk in hospital room?: Total Help needed climbing 3-5 steps with a railing? : Total 6 Click Score: 8    End of Session   Activity Tolerance: Patient tolerated treatment well;Patient limited by fatigue Patient left: in chair;with call bell/phone within reach;with family/visitor present Nurse Communication: Mobility status;Need for lift equipment PT Visit Diagnosis: Muscle weakness (generalized) (M62.81);Difficulty in walking, not elsewhere classified (R26.2);Other abnormalities of gait and mobility (R26.89);History of falling (Z91.81)    Time: [id number] PT Time Calculation (min) (ACUTE ONLY): 43 min   Charges:   PT Evaluation $PT Eval Moderate Complexity: 1 Mod PT Treatments $Therapeutic Activity: 23-37 mins        Lasya Vetter, PT, GCS 12/04/18,1:10 PM

## 2018-12-04 NOTE — Consult Note (Addendum)
Medical Consultation   [name]  [id number]  DOB: December 20, 1945  DOA: 12/03/2018  PCP: Patient, No Pcp Per   Requesting physician: Katha Hamming, MD   Reason for consultation: Medical management   History of Present Illness: Jean Stewart is an 73 y.o. female with past medical history significant for hypertension, CHF, permanant atrial fibrillation on coumadin, COPD, oxygen dependent on 2-3L, thrombocytopenia,  liver failure, chronic kidney disease stage 3, and morbid obesity; who presented with left ankle pain yesterday.  She had been trying to get out of her bed to her bedside commode at the time with the assistance of her daughter.  Her foot was not turning properly and she fell. Patient report immediate pain and blood from a open wound.En route with EMS patient received Fentanyl and 2 g of Ancef.Admission labs revealed hemoglobin 11.4, platlets 143, BUN 23, Cr 1.59, and  INR 2.6.  X-rays revealed lateral dislocation of the talus with a transverse fracture. Orthopedics was called to admit the patient.Patient was given 5 mg of vitamin K for reversal of INR with goal <2. The patient was taken to the OR for I&D,  open reduction internal fixation of the left trimalleolar ankle fracture, and external fixation yesterday. Plan was for patient to receive   There was some miscommunication and [facility] was not initially consulted on admission.   Review of Systems:  Review of Systems  Musculoskeletal: Positive for falls and joint pain.   As per HPI otherwise 10 point review of systems negative.     Past Medical History: Past Medical History:  Diagnosis Date   A-fib ([place])    COPD (chronic obstructive pulmonary disease) ([place])    Hypertension    Liver failure ([facility])    Morbid obesity ([place])    Neuropathy    Physical deconditioning    Suspected sleep apnea     Past Surgical History: History reviewed. No pertinent surgical history.   Allergies:  No Known  Allergies   Social History:  reports that she has quit smoking. She has never used smokeless tobacco. She reports previous alcohol use. She reports that she does not use drugs.   Family History: Family History  Problem Relation Age of Onset   CAD Mother        MI at age 34    Unacceptable: Noncontributory, unremarkable, or negative. Acceptable: Family history reviewed and not pertinent (If you reviewed it)   Physical Exam: Vitals:   12/03/18 1820 12/03/18 1858 12/03/18 2115 12/04/18 0518  BP: 134/83 (!) 145/89 119/90 92/65  Pulse: 90 85 87 83  Resp: 18 16 18 18   Temp:  (!) 97.3 F (36.3 C) 98 F (36.7 C) 97.7 F (36.5 C)  TempSrc:  Oral    SpO2: 100% 99% 100% 99%  Weight:      Height:        Constitutional: Obese,  Alert and awake, oriented x3, not in any acute distress. Eyes: PERLA, EOMI, irises appear normal, anicteric sclera,  ENMT: external ears and nose appear normal,             Lips appears normal, oropharynx mucosa, tongue, posterior pharynx appear normal  Neck: neck appears normal, no masses, normal ROM, no thyromegaly, no JVD  CVS: S1-S2 clear, no murmur rubs or gallops, no LE edema, normal pedal pulses  Respiratory:  clear to auscultation bilaterally, no wheezing, rales or rhonchi. Respiratory effort normal. No accessory  muscle use.  Abdomen: soft nontender, nondistended, normal bowel sounds, no hepatosplenomegaly, no hernias  Musculoskeletal:Left ankle wrapped with external fixators in place. Neuro: Cranial nerves II-XII intact, strength, sensation, reflexes Psych: judgement and insight appear normal, stable mood and affect, mental status Skin: no rashes or lesions or ulcers, no induration or nodules     Data reviewed:  I have personally reviewed following labs and imaging studies Labs:  CBC: Recent Labs  Lab 12/03/18 1002 12/04/18 0204  WBC 5.5 8.9  NEUTROABS 3.1  --   HGB 11.4* 11.0*  HCT 36.8 34.3*  MCV 101.4* 98.8  PLT 143* 143*     Basic Metabolic Panel: Recent Labs  Lab 12/03/18 1002  NA 139  K 4.9  CL 100  CO2 25  GLUCOSE 131*  BUN 23  CREATININE 1.59*  CALCIUM 9.1   GFR Estimated Creatinine Clearance: 52.2 mL/min (A) (by C-G formula based on SCr of 1.59 mg/dL (H)). Liver Function Tests: No results for input(s): AST, ALT, ALKPHOS, BILITOT, PROT, ALBUMIN in the last 168 hours. No results for input(s): LIPASE, AMYLASE in the last 168 hours. No results for input(s): AMMONIA in the last 168 hours. Coagulation profile Recent Labs  Lab 12/03/18 1000  INR 2.6*    Cardiac Enzymes: No results for input(s): CKTOTAL, CKMB, CKMBINDEX, TROPONINI in the last 168 hours. BNP: Invalid input(s): POCBNP CBG: No results for input(s): GLUCAP in the last 168 hours. D-Dimer No results for input(s): DDIMER in the last 72 hours. Hgb A1c No results for input(s): HGBA1C in the last 72 hours. Lipid Profile No results for input(s): CHOL, HDL, LDLCALC, TRIG, CHOLHDL, LDLDIRECT in the last 72 hours. Thyroid function studies No results for input(s): TSH, T4TOTAL, T3FREE, THYROIDAB in the last 72 hours.  Invalid input(s): FREET3 Anemia work up No results for input(s): VITAMINB12, FOLATE, FERRITIN, TIBC, IRON, RETICCTPCT in the last 72 hours. Urinalysis    Component Value Date/Time   COLORURINE AMBER (A) 05/11/2018 2030   APPEARANCEUR CLOUDY (A) 05/11/2018 2030   LABSPEC 1.026 05/11/2018 2030   PHURINE 5.0 05/11/2018 2030   GLUCOSEU NEGATIVE 05/11/2018 2030   HGBUR SMALL (A) 05/11/2018 2030   BILIRUBINUR SMALL (A) 05/11/2018 2030   [place] NEGATIVE 05/11/2018 2030   PROTEINUR 30 (A) 05/11/2018 2030   NITRITE NEGATIVE 05/11/2018 2030   LEUKOCYTESUR SMALL (A) 05/11/2018 2030     Microbiology Recent Results (from the past 240 hour(s))  SARS Coronavirus 2 [facility] order, Performed in [facility] hospital lab) Nasopharyngeal Nasopharyngeal Swab     Status: None   Collection Time: 12/03/18 10:11 AM    Specimen: Nasopharyngeal Swab  Result Value Ref Range Status   SARS Coronavirus 2 NEGATIVE NEGATIVE Final    Comment: (NOTE) If result is NEGATIVE SARS-CoV-2 target nucleic acids are NOT DETECTED. The SARS-CoV-2 RNA is generally detectable in upper and lower  respiratory specimens during the acute phase of infection. The lowest  concentration of SARS-CoV-2 viral copies this assay can detect is 250  copies / mL. A negative result does not preclude SARS-CoV-2 infection  and should not be used as the sole basis for treatment or other  patient management decisions.  A negative result may occur with  improper specimen collection / handling, submission of specimen other  than nasopharyngeal swab, presence of viral mutation(s) within the  areas targeted by this assay, and inadequate number of viral copies  (<250 copies / mL). A negative result must be combined with clinical  observations, patient history, and epidemiological  information. If result is POSITIVE SARS-CoV-2 target nucleic acids are DETECTED. The SARS-CoV-2 RNA is generally detectable in upper and lower  respiratory specimens dur ing the acute phase of infection.  Positive  results are indicative of active infection with SARS-CoV-2.  Clinical  correlation with patient history and other diagnostic information is  necessary to determine patient infection status.  Positive results do  not rule out bacterial infection or co-infection with other viruses. If result is PRESUMPTIVE POSTIVE SARS-CoV-2 nucleic acids MAY BE PRESENT.   A presumptive positive result was obtained on the submitted specimen  and confirmed on repeat testing.  While 2019 novel coronavirus  (SARS-CoV-2) nucleic acids may be present in the submitted sample  additional confirmatory testing may be necessary for epidemiological  and / or clinical management purposes  to differentiate between  SARS-CoV-2 and other Sarbecovirus currently known to infect humans.  If  clinically indicated additional testing with an alternate test  methodology [phone]) is advised. The SARS-CoV-2 RNA is generally  detectable in upper and lower respiratory sp ecimens during the acute  phase of infection. The expected result is Negative. Fact Sheet for Patients:  StrictlyIdeas.no Fact Sheet for Healthcare Providers: BankingDealers.co.za This test is not yet approved or cleared by the [place] FDA and has been authorized for detection and/or diagnosis of SARS-CoV-2 by FDA under an Emergency Use Authorization (EUA).  This EUA will remain in effect (meaning this test can be used) for the duration of the COVID-19 declaration under Section 564(b)(1) of the Act, 21 U.S.C. section 360bbb-3(b)(1), unless the authorization is terminated or revoked sooner. Performed at [facility], [place] [address], [place],  [address]        Inpatient Medications:   Scheduled Meds:  acetaminophen  1,000 mg Oral Q6H   cholecalciferol  5,000 Units Oral Daily   gabapentin  100 mg Oral TID   Vitamin D (Ergocalciferol)  50,000 Units Oral Q7 days   warfarin  7.5 mg Oral ONCE-1800   Warfarin - Pharmacist Dosing Inpatient   Does not apply q1800   Continuous Infusions:  cefTRIAXone (ROCEPHIN)  IV 2 g (12/03/18 2132)     Radiological Exams on Admission: Dg Ankle Complete Left  Result Date: 12/03/2018 CLINICAL DATA:  Status post ORIF left ankle. External fixator placement left lower leg. EXAM: LEFT ANKLE COMPLETE - 3+ VIEW COMPARISON:  Preoperative radiographs earlier this day. FINDINGS: Lateral plate and screw fixation of distal fibular fracture. Two additional syndesmotic screws. Improved alignment of the ankle mortise. Fixator pin through the calcaneus and mid tibial shaft, partially included. Nondisplaced posterior malleolar fracture. Irregularity about the distal aspect of the medial malleolus. Generalized soft tissue  edema with scattered soft tissue air. A wound VAC is in place. IMPRESSION: ORIF of distal fibular fracture with external fixator in place. Improved alignment of the ankle mortise. Nondisplaced posterior malleolar fracture better appreciated on the current exam than on preoperative imaging. Electronically Signed   By: Keith Rake M.D.   On: 12/03/2018 20:03   Dg Ankle Complete Left  Result Date: 12/03/2018 CLINICAL DATA:  Ankle fracture EXAM: LEFT ANKLE COMPLETE - 3+ VIEW; DG C-ARM 1-60 MIN COMPARISON:  12/03/2018 FINDINGS: Eight low resolution intraoperative spot views of the ankle. Total fluoroscopy time was 2 minutes 47 seconds. Initial images demonstrate open fracture deformity medial side of the ankle with residual lateral dislocation of the talus with respect to the distal tibia. Subsequent images demonstrate surgical plate and multiple screw fixation of the distal  fibula with 2 long fixating screws across the tibiofibular articulation. IMPRESSION: Intraoperative fluoroscopic assistance provided during surgical fixation of ankle fractures Electronically Signed   By: Donavan Foil M.D.   On: 12/03/2018 19:30   Dg Chest Portable 1 View  Result Date: 12/03/2018 CLINICAL DATA:  Pain status post fall EXAM: PORTABLE CHEST 1 VIEW COMPARISON:  May 13, 2018 FINDINGS: The heart size is enlarged. There is no pneumothorax. No large pleural effusion. There is no displaced fracture. No focal infiltrate. IMPRESSION: Cardiomegaly without evidence for an acute process. Electronically Signed   By: Constance Holster M.D.   On: 12/03/2018 10:40   Dg Ankle Left Port  Result Date: 12/03/2018 CLINICAL DATA:  Left ankle fracture EXAM: PORTABLE LEFT ANKLE - 2 VIEW COMPARISON:  Earlier same day. FINDINGS: Oblique displaced distal fibular metaphysis fracture 7 mm of lateral displacement. Interval improvement in tibiotalar dislocation with persistent 6 mm of lateral subluxation of the talar dome relative to the tibial  plafond. Mild osteoarthritis of the tibiotalar joint. Breast no aggressive osseous lesion. IMPRESSION: Oblique displaced distal fibular metaphysis fracture 7 mm of lateral displacement. Interval improvement in tibiotalar dislocation with persistent 6 mm of lateral subluxation of the talar dome relative to the tibial plafond. Electronically Signed   By: Kathreen Devoid   On: 12/03/2018 12:48   Dg Ankle Left Port  Result Date: 12/03/2018 CLINICAL DATA:  Left ankle injury in a fall this morning. Initial encounter. EXAM: PORTABLE LEFT ANKLE - 2 VIEW COMPARISON:  None. FINDINGS: The talus is laterally dislocated out of the tibiotalar joint. There is a transverse fracture through the lateral malleolus. Large skin laceration is identified medially. Bones appear osteopenic. Hindfoot and midfoot osteoarthritis noted. IMPRESSION: Lateral dislocation of the talus out of the tibiotalar joint with a transverse fracture through the lateral malleolus and a large skin laceration over the medial malleolus. Electronically Signed   By: Inge Rise M.D.   On: 12/03/2018 10:40   Dg C-arm 1-60 Min  Result Date: 12/03/2018 CLINICAL DATA:  Ankle fracture EXAM: LEFT ANKLE COMPLETE - 3+ VIEW; DG C-ARM 1-60 MIN COMPARISON:  12/03/2018 FINDINGS: Eight low resolution intraoperative spot views of the ankle. Total fluoroscopy time was 2 minutes 47 seconds. Initial images demonstrate open fracture deformity medial side of the ankle with residual lateral dislocation of the talus with respect to the distal tibia. Subsequent images demonstrate surgical plate and multiple screw fixation of the distal fibula with 2 long fixating screws across the tibiofibular articulation. IMPRESSION: Intraoperative fluoroscopic assistance provided during surgical fixation of ankle fractures Electronically Signed   By: Donavan Foil M.D.   On: 12/03/2018 19:30    Impression/Recommendations Open ankle fracture dislocation: POD#1 from I&D, ORIF, and  external fixation.  -Per orthopedics -NWB on left leg -wound/dressing changes -pain management -Rocephin 48 hrs post procedure -PT to eval and treat -Social work for ned of placement.  Permanent atrial fibrillation on chronic anticoagulation: INR therapeutic at 2.6 on 9/28.  She received 5 mg vitamin K IV yesterday prior to procedure. CHA2DS2-VASc score =at least 4(Age, HTN, sex) -Continue metoprolol as tolerated -Check PT/INR daily -Coumadin per pharmacy  Anemia of chronic disease: Repeat hemoglobin 11 g/dL down from 11.4 g/dL preprocedure. -Repeat CBC in a.m.  COPD, oxygen dependent: Patient is supposed to be on 2-3  liters of nasal cannula oxygen continuously. CXR revealed cardiomegaly without acute process. -Continue nasal cannula oxygen of 2-3L -Albuterol nebs as need for SOB/wheezing -Monitor for hypercapneia and possible need of bipap  with pain medications  Combined systolic and diastolic congestive heart failure: Patient appears euvolemic at this time. EF last noted to be 40-45% in 05/2018.  -Strict I&Os -Daily weights  Chronic kidney disease stage III/IV: Stable.  Patient's admission creatinine was 1.59. Her baseline creatinine appears to range from 1.5-1.8 -Continue to monitor   Essential hypertension: stable. -Continue metoprolol and furosemide  Morbid obesity: BMI 66.21 kg/m -Continue counseling on need of weight loss  Thrombocytopenia: Chronic. -Continue to monitor  Thank you for this consultation.  Our [facility] hospitalist team will follow the patient with you.   Time Spent: 25 mins  Norval Morton M.D. [facility] 12/04/2018, 10:44 AM       -

## 2018-12-04 NOTE — Plan of Care (Signed)

## 2018-12-04 NOTE — Progress Notes (Signed)
ANTICOAGULATION CONSULT NOTE - Follow Up Consult  Pharmacy Consult for Warfarin Indication: atrial fibrillation  No Known Allergies  Patient Measurements: Height: 5' 5.5" (166.4 cm) Weight: (!) 404 lb (183.3 kg) IBW/kg (Calculated) : 58.15   Vital Signs: Temp: 97.7 F (36.5 C) (09/29 0518) BP: 92/65 (09/29 0518) Pulse Rate: 83 (09/29 0518)  Labs: Recent Labs    12/03/18 1000 12/03/18 1002 12/04/18 0204  HGB  --  11.4* 11.0*  HCT  --  36.8 34.3*  PLT  --  143* 143*  LABPROT 27.3*  --   --   INR 2.6*  --   --   CREATININE  --  1.59*  --     Estimated Creatinine Clearance: 52.2 mL/min (A) (by C-G formula based on SCr of 1.59 mg/dL (H)).   Assessment: 73 year old female s/p ankle surgery to resume warfarin for Afib Received Vitamin K 5 mg IV 9/28 Dose prior to admission = 5 mg daily  Goal of Therapy:  INR 2-3 Monitor platelets by anticoagulation protocol: Yes   Plan:  Warfarin 7.5 mg po x 1 dose at 1800 pm Daily INR  Thank you Anette Guarneri, PharmD [phone]  12/04/2018,7:57 AM

## 2018-12-04 NOTE — Progress Notes (Signed)
Orthopaedic Trauma Progress Note  S: Doing well, no complaints. Pain controlled.  O:  Vitals:   12/03/18 2115 12/04/18 0518  BP: 119/90 92/65  Pulse: 87 83  Resp: 18 18  Temp: 98 F (36.7 C) 97.7 F (36.5 C)  SpO2: 100% 99%    Gen: NAD, AAOx3 LLE: Ex-fix in place, incisional wound vac with minimal drainage. Wiggles toes and sensation grossly intact  Imaging: Stable postop imaging  Labs:  Results for orders placed or performed during the hospital encounter of 12/03/18 (from the past 24 hour(s))  Sample to Blood Bank     Status: None   Collection Time: 12/03/18 10:00 AM  Result Value Ref Range   Blood Bank Specimen SAMPLE AVAILABLE FOR TESTING    Sample Expiration      12/04/2018,2359 Performed at [facility], [place] [address], [place], [place] [address]   Protime-INR     Status: Abnormal   Collection Time: 12/03/18 10:00 AM  Result Value Ref Range   Prothrombin Time 27.3 (H) 11.4 - 15.2 seconds   INR 2.6 (H) 0.8 - 1.2  Basic metabolic panel     Status: Abnormal   Collection Time: 12/03/18 10:02 AM  Result Value Ref Range   Sodium 139 135 - 145 mmol/L   Potassium 4.9 3.5 - 5.1 mmol/L   Chloride 100 98 - 111 mmol/L   CO2 25 22 - 32 mmol/L   Glucose, Bld 131 (H) 70 - 99 mg/dL   BUN 23 8 - 23 mg/dL   Creatinine, Ser 1.59 (H) 0.44 - 1.00 mg/dL   Calcium 9.1 8.9 - 10.3 mg/dL   GFR calc non Af Amer 31 (L) >60 mL/min   GFR calc Af Amer 36 (L) >60 mL/min   Anion gap 14 5 - 15  CBC with Differential     Status: Abnormal   Collection Time: 12/03/18 10:02 AM  Result Value Ref Range   WBC 5.5 4.0 - 10.5 K/uL   RBC 3.63 (L) 3.87 - 5.11 MIL/uL   Hemoglobin 11.4 (L) 12.0 - 15.0 g/dL   HCT 36.8 36.0 - 46.0 %   MCV 101.4 (H) 80.0 - 100.0 fL   MCH 31.4 26.0 - 34.0 pg   MCHC 31.0 30.0 - 36.0 g/dL   RDW 13.4 11.5 - 15.5 %   Platelets 143 (L) 150 - 400 K/uL   nRBC 0.0 0.0 - 0.2 %   Neutrophils Relative % 57 %   Neutro Abs 3.1 1.7 - 7.7 K/uL   Lymphocytes Relative 36 %    Lymphs Abs 2.0 0.7 - 4.0 K/uL   Monocytes Relative 3 %   Monocytes Absolute 0.2 0.1 - 1.0 K/uL   Eosinophils Relative 3 %   Eosinophils Absolute 0.2 0.0 - 0.5 K/uL   Basophils Relative 0 %   Basophils Absolute 0.0 0.0 - 0.1 K/uL   Immature Granulocytes 1 %   Abs Immature Granulocytes 0.03 0.00 - 0.07 K/uL  SARS Coronavirus 2 [facility] order, Performed in [facility] hospital lab) Nasopharyngeal Nasopharyngeal Swab     Status: None   Collection Time: 12/03/18 10:11 AM   Specimen: Nasopharyngeal Swab  Result Value Ref Range   SARS Coronavirus 2 NEGATIVE NEGATIVE  VITAMIN D 25 Hydroxy (Vit-D Deficiency, Fractures)     Status: Abnormal   Collection Time: 12/03/18  7:26 PM  Result Value Ref Range   Vit D, 25-Hydroxy 9.4 (L) 30.0 - 100.0 ng/mL  CBC     Status: Abnormal  Collection Time: 12/04/18  2:04 AM  Result Value Ref Range   WBC 8.9 4.0 - 10.5 K/uL   RBC 3.47 (L) 3.87 - 5.11 MIL/uL   Hemoglobin 11.0 (L) 12.0 - 15.0 g/dL   HCT 34.3 (L) 36.0 - 46.0 %   MCV 98.8 80.0 - 100.0 fL   MCH 31.7 26.0 - 34.0 pg   MCHC 32.1 30.0 - 36.0 g/dL   RDW 13.4 11.5 - 15.5 %   Platelets 143 (L) 150 - 400 K/uL   nRBC 0.0 0.0 - 0.2 %    Assessment: 73 year old female with a history of atrial fibrillation on Coumadin, morbid obesity, hypertension and COPD s/p fall  Injuries: L type IIIA open ankle fracture dislocation   Weightbearing: NWB  Insicional and dressing care: Incisional wound vac until POD 2 vs 3. Kerlix to ex fix pin site, change PRN  CV/Blood loss:Acute blood loss anemia, Hgb 11.0 this AM, stable. Hemodynamically stable  Pain management: 1. Oxycodone 5-10 mg PRN 2. Robaxin 500 mg PRN 3. Dilaudid 1 mg PRN 4. Gabapentin 100 mg TID 5. Scheduled tylenol 1000 mg q 6 hrs  VTE prophylaxis: Okay to restart coumadin today  ID: Ceftriaxone 2 gm x48 hours  Medical co-morbidities: Per internal medicine (not orthopaedic primary)  Impediments to Fracture Healing: Severe vitamin D  deficiency-start vitamin D3 daily and vitamin D2 weekly  Dispo: Will need SNF placement  Follow - up plan: 2 weeks with orthopaedics  Shona Needles, MD [facility] [phone] (phone) [phone] (office) [email or web address]

## 2018-12-04 NOTE — Evaluation (Signed)
Occupational Therapy Evaluation Patient Details Name: Jean Stewart MRN: [id number] DOB: 11/25/1945 Today's Date: 12/04/2018    History of Present Illness Patient is a 73 year old female admitted after fall with L ankle fracture. PMH includes: HTN, morbid obesity, Afib, COPD.   Clinical Impression   This 73 y/o female presents with the above. PTA pt reports she was receiving assist from daughter for bathing/dressing ADL (most recently completing ADL either sitting EOB or bed level). Pt currently requires +2 assist for bed mobility, was able to initiate scooting along EOB and beginning to perform lateral/scoot transfer to drop arm recliner with +2 assist, though ultimately utilized +3 assist (+4 present) for safe transfer completion given pt fatigue and recliner not staying in place. Discussed with nursing staff use of maximove for transfer back to bed. Pt currently requires minguard assist for seated UB ADL, max-totalA for LB ADL. She will benefit from continued acute OT services and recommend follow up therapy services in SNF setting to progress her safety/independence with ADL and mobility. Will follow.     Follow Up Recommendations  SNF;Supervision/Assistance - 24 hour    Equipment Recommendations  Other (comment)(TBD in next venue)           Precautions / Restrictions Precautions Precautions: Fall Precaution Comments: external fixator on L LE Required Braces or Orthoses: Other Brace(ex-fix LLE) Restrictions Weight Bearing Restrictions: Yes LLE Weight Bearing: Non weight bearing      Mobility Bed Mobility Overal bed mobility: Needs Assistance Bed Mobility: Supine to Sit     Supine to sit: Mod assist;+2 for physical assistance     General bed mobility comments: Patient is able to assist with supine to sit. Requires assistance to move L LE off bed. Max cues.  Transfers Overall transfer level: Needs assistance Equipment used: None Transfers: Lateral/Scoot Transfers           Lateral/Scoot Transfers: +2 physical assistance;From elevated surface;Max assist General transfer comment: Patient got about halfway from bed to recliner and recliner was scooting away from bed, therefore called NT to come assist. Patient was ultimately able to get all the way to recliner with +4 assist. Cues needed to lean forward instead of back during scooting.    Balance Overall balance assessment: Needs assistance Sitting-balance support: Feet supported;Bilateral upper extremity supported Sitting balance-Leahy Scale: Fair                                     ADL either performed or assessed with clinical judgement   ADL Overall ADL's : Needs assistance/impaired Eating/Feeding: Modified independent;Sitting   Grooming: Set up;Min guard;Sitting   Upper Body Bathing: Min guard;Sitting   Lower Body Bathing: Sitting/lateral leans;Maximal assistance;Total assistance;+2 for physical assistance;+2 for safety/equipment   Upper Body Dressing : Min guard;Sitting   Lower Body Dressing: Total assistance;+2 for physical assistance;+2 for safety/equipment;Sitting/lateral leans   Toilet Transfer: Maximal assistance(+3, lateral/scoot transfer) Toilet Transfer Details (indicate cue type and reason): simulated via tranfser to drom arm recliner Toileting- Clothing Manipulation and Hygiene: Total assistance;+2 for physical assistance;+2 for safety/equipment;Sitting/lateral lean       Functional mobility during ADLs: Maximal assistance(+3, lateral scoot) General ADL Comments: pt with generally good UB strength, but fatigues quickly     Vision         Perception     Praxis      Pertinent Vitals/Pain Pain Assessment: Faces Faces Pain Scale: Hurts little more Pain  Location: LLE intermittently with certain movements Pain Descriptors / Indicators: Discomfort;Grimacing;Guarding Pain Intervention(s): Monitored during session;Limited activity within patient's  tolerance;Repositioned     Hand Dominance Right   Extremity/Trunk Assessment Upper Extremity Assessment Upper Extremity Assessment: Overall WFL for tasks assessed   Lower Extremity Assessment Lower Extremity Assessment: Defer to PT evaluation LLE Sensation: WNL   Cervical / Trunk Assessment Cervical / Trunk Assessment: Other exceptions Cervical / Trunk Exceptions: increased body habitus   Communication Communication Communication: No difficulties   Cognition Arousal/Alertness: Awake/alert Behavior During Therapy: WFL for tasks assessed/performed Overall Cognitive Status: Within Functional Limits for tasks assessed                                     General Comments       Exercises     Shoulder Instructions      Home Living Family/patient expects to be discharged to:: Skilled nursing facility Living Arrangements: Children Available Help at Discharge: Family;Available PRN/intermittently Type of Home: House Home Access: Level entry     Home Layout: One level     Bathroom Shower/Tub: Tub/shower unit;Walk-in shower   Bathroom Toilet: Standard     Home Equipment: Environmental consultant - 2 wheels;Bedside commode          Prior Functioning/Environment Level of Independence: Needs assistance  Gait / Transfers Assistance Needed: used RW for very short trips, states she just started getting up to [facility]. So limited mobility at baseline. ADL's / Homemaking Assistance Needed: reports daughter assisted with ADL, either seated EOB or bed level   Comments: unclear as to how recently pt was mobile prior to this fall        OT Problem List: Decreased strength;Decreased range of motion;Decreased activity tolerance;Impaired balance (sitting and/or standing);Decreased cognition;Decreased safety awareness;Decreased knowledge of use of DME or AE;Pain;Obesity      OT Treatment/Interventions: Self-care/ADL training;Therapeutic exercise;Neuromuscular education;DME and/or AE  instruction;Energy conservation;Therapeutic activities;Visual/perceptual remediation/compensation;Patient/family education;Balance training    OT Goals(Current goals can be found in the care plan section) Acute Rehab OT Goals Patient Stated Goal: none stated OT Goal Formulation: With patient Time For Goal Achievement: 12/18/18 Potential to Achieve Goals: Good  OT Frequency: Min 2X/week   Barriers to D/C:            Co-evaluation PT/OT/SLP Co-Evaluation/Treatment: Yes Reason for Co-Treatment: For patient/therapist safety;To address functional/ADL transfers PT goals addressed during session: Mobility/safety with mobility;Balance OT goals addressed during session: ADL's and self-care      AM-PAC OT "6 Clicks" Daily Activity     Outcome Measure Help from another person eating meals?: None Help from another person taking care of personal grooming?: None Help from another person toileting, which includes using toliet, bedpan, or urinal?: Total Help from another person bathing (including washing, rinsing, drying)?: A Lot Help from another person to put on and taking off regular upper body clothing?: A Little Help from another person to put on and taking off regular lower body clothing?: Total 6 Click Score: 15   End of Session Nurse Communication: Mobility status;Need for lift equipment  Activity Tolerance: Patient tolerated treatment well Patient left: in chair;with call bell/phone within reach;with family/visitor present;with nursing/sitter in room  OT Visit Diagnosis: Other abnormalities of gait and mobility (R26.89);History of falling (Z91.81)                Time: [id number] OT Time Calculation (min): 43 min Charges:  OT General Charges $  OT Visit: 1 Visit OT Evaluation $OT Eval Moderate Complexity: 1 Mod  Lou Cal, OT [employer] Pager [phone] Office [place] 12/04/2018, 4:04 PM

## 2018-12-05 DIAGNOSIS — S82892B Other fracture of left lower leg, initial encounter for open fracture type I or II: Secondary | ICD-10-CM

## 2018-12-05 LAB — CBC WITH DIFFERENTIAL/PLATELET
Abs Immature Granulocytes: 0.04 10*3/uL (ref 0.00–0.07)
Basophils Absolute: 0 10*3/uL (ref 0.0–0.1)
Basophils Relative: 0 %
Eosinophils Absolute: 0.3 10*3/uL (ref 0.0–0.5)
Eosinophils Relative: 3 %
HCT: 26.9 % — ABNORMAL LOW (ref 36.0–46.0)
Hemoglobin: 8.5 g/dL — ABNORMAL LOW (ref 12.0–15.0)
Immature Granulocytes: 0 %
Lymphocytes Relative: 20 %
Lymphs Abs: 1.9 10*3/uL (ref 0.7–4.0)
MCH: 31.1 pg (ref 26.0–34.0)
MCHC: 31.6 g/dL (ref 30.0–36.0)
MCV: 98.5 fL (ref 80.0–100.0)
Monocytes Absolute: 0.7 10*3/uL (ref 0.1–1.0)
Monocytes Relative: 8 %
Neutro Abs: 6.5 10*3/uL (ref 1.7–7.7)
Neutrophils Relative %: 69 %
Platelets: 117 10*3/uL — ABNORMAL LOW (ref 150–400)
RBC: 2.73 MIL/uL — ABNORMAL LOW (ref 3.87–5.11)
RDW: 13.7 % (ref 11.5–15.5)
WBC: 9.5 10*3/uL (ref 4.0–10.5)
nRBC: 0 % (ref 0.0–0.2)

## 2018-12-05 LAB — BASIC METABOLIC PANEL
Anion gap: 12 (ref 5–15)
BUN: 38 mg/dL — ABNORMAL HIGH (ref 8–23)
CO2: 26 mmol/L (ref 22–32)
Calcium: 8.6 mg/dL — ABNORMAL LOW (ref 8.9–10.3)
Chloride: 99 mmol/L (ref 98–111)
Creatinine, Ser: 2.34 mg/dL — ABNORMAL HIGH (ref 0.44–1.00)
GFR calc Af Amer: 23 mL/min — ABNORMAL LOW (ref >60)
GFR calc non Af Amer: 20 mL/min — ABNORMAL LOW (ref >60)
Glucose, Bld: 116 mg/dL — ABNORMAL HIGH (ref 70–99)
Potassium: 4 mmol/L (ref 3.5–5.1)
Sodium: 137 mmol/L (ref 135–145)

## 2018-12-05 LAB — PROTIME-INR
INR: 1.7 — ABNORMAL HIGH (ref 0.8–1.2)
Prothrombin Time: 19.8 seconds — ABNORMAL HIGH (ref 11.4–15.2)

## 2018-12-05 LAB — GLUCOSE, CAPILLARY: Glucose-Capillary: 133 mg/dL — ABNORMAL HIGH (ref 70–99)

## 2018-12-05 MED ORDER — SODIUM CHLORIDE 0.9 % IV SOLN
INTRAVENOUS | Status: AC
  Administered 2018-12-05: 09:00:00 via INTRAVENOUS

## 2018-12-05 MED ORDER — WARFARIN SODIUM 7.5 MG PO TABS
7.5000 mg | ORAL_TABLET | Freq: Once | ORAL | Status: AC
  Administered 2018-12-05: 17:00:00 7.5 mg via ORAL
  Filled 2018-12-05: qty 1

## 2018-12-05 NOTE — Progress Notes (Signed)
Orthopaedic Trauma Progress Note  S: Doing well, no complaints. Pain controlled. Was able to get up with therapy yesterday  O:  Vitals:   12/04/18 2134 12/05/18 0520  BP: (!) 95/54 93/61  Pulse: 93 62  Resp: 18 18  Temp: 98 F (36.7 C) 97.8 F (36.6 C)  SpO2: 97% 98%    Gen: Laying in bed, NAD, AAOx3 LLE: Ex-fix in place, incisional wound vac with minimal drainage. Wiggles toes and sensation grossly intact  Imaging: Stable postop imaging  Labs:  Results for orders placed or performed during the hospital encounter of 12/03/18 (from the past 24 hour(s))  Protime-INR     Status: Abnormal   Collection Time: 12/05/18  2:57 AM  Result Value Ref Range   Prothrombin Time 19.8 (H) 11.4 - 15.2 seconds   INR 1.7 (H) 0.8 - 1.2  CBC with Differential/Platelet     Status: Abnormal   Collection Time: 12/05/18  2:57 AM  Result Value Ref Range   WBC 9.5 4.0 - 10.5 K/uL   RBC 2.73 (L) 3.87 - 5.11 MIL/uL   Hemoglobin 8.5 (L) 12.0 - 15.0 g/dL   HCT 26.9 (L) 36.0 - 46.0 %   MCV 98.5 80.0 - 100.0 fL   MCH 31.1 26.0 - 34.0 pg   MCHC 31.6 30.0 - 36.0 g/dL   RDW 13.7 11.5 - 15.5 %   Platelets 117 (L) 150 - 400 K/uL   nRBC 0.0 0.0 - 0.2 %   Neutrophils Relative % 69 %   Neutro Abs 6.5 1.7 - 7.7 K/uL   Lymphocytes Relative 20 %   Lymphs Abs 1.9 0.7 - 4.0 K/uL   Monocytes Relative 8 %   Monocytes Absolute 0.7 0.1 - 1.0 K/uL   Eosinophils Relative 3 %   Eosinophils Absolute 0.3 0.0 - 0.5 K/uL   Basophils Relative 0 %   Basophils Absolute 0.0 0.0 - 0.1 K/uL   Immature Granulocytes 0 %   Abs Immature Granulocytes 0.04 0.00 - 0.07 K/uL  Basic metabolic panel     Status: Abnormal   Collection Time: 12/05/18  2:57 AM  Result Value Ref Range   Sodium 137 135 - 145 mmol/L   Potassium 4.0 3.5 - 5.1 mmol/L   Chloride 99 98 - 111 mmol/L   CO2 26 22 - 32 mmol/L   Glucose, Bld 116 (H) 70 - 99 mg/dL   BUN 38 (H) 8 - 23 mg/dL   Creatinine, Ser 2.34 (H) 0.44 - 1.00 mg/dL   Calcium 8.6 (L) 8.9 -  10.3 mg/dL   GFR calc non Af Amer 20 (L) >60 mL/min   GFR calc Af Amer 23 (L) >60 mL/min   Anion gap 12 5 - 15    Assessment: 73 year old female with a history of atrial fibrillation on Coumadin, morbid obesity, hypertension and COPD s/p fall  Injuries: L type IIIA open ankle fracture dislocation   Weightbearing: NWB  Insicional and dressing care: Incisional wound vac until POD 3. Kerlix to ex fix pin site, change PRN  CV/Blood loss: Acute blood loss anemia, Hgb 8.5 this AM. Continue to monitor CBC  Pain management: 1. Oxycodone 5-10 mg PRN 2. Robaxin 500 mg PRN 3. Dilaudid 1 mg PRN 4. Gabapentin 100 mg TID 5. Scheduled tylenol 1000 mg q 6 hrs  VTE prophylaxis: Coumadin  ID: Ceftriaxone 2 gm x48 hours  Medical co-morbidities: Per internal medicine   Impediments to Fracture Healing: Severe vitamin D deficiency-start vitamin D3 daily and vitamin D2  weekly  Dispo: Up with therapy. Will need SNF placement  Follow - up plan: 2 weeks with orthopaedics for repeat x-rays, suture removal   Mikko Lewellen A. [facility] ?[phone] (phone) [email or web address]

## 2018-12-05 NOTE — Progress Notes (Signed)
ANTICOAGULATION CONSULT NOTE - Follow Up Consult  Pharmacy Consult for Warfarin Indication: atrial fibrillation  No Known Allergies  Patient Measurements: Height: 5' 5.5" (166.4 cm) Weight: (!) 404 lb (183.3 kg) IBW/kg (Calculated) : 58.15   Vital Signs: Temp: 97.8 F (36.6 C) (09/30 0520) Temp Source: Oral (09/30 0520) BP: 93/61 (09/30 0520) Pulse Rate: 62 (09/30 0520)  Labs: Recent Labs    12/03/18 1000  12/03/18 1002 12/04/18 0204 12/05/18 0257  HGB  --    < > 11.4* 11.0* 8.5*  HCT  --   --  36.8 34.3* 26.9*  PLT  --   --  143* 143* 117*  LABPROT 27.3*  --   --   --  19.8*  INR 2.6*  --   --   --  1.7*  CREATININE  --   --  1.59*  --  2.34*   < > = values in this interval not displayed.    Estimated Creatinine Clearance: 35.5 mL/min (A) (by C-G formula based on SCr of 2.34 mg/dL (H)).   Assessment: 73 year old female s/p ankle surgery to resume warfarin for Afib Received Vitamin K 5 mg IV 9/28 Dose prior to admission = 5 mg daily  INR today 1.7  Goal of Therapy:  INR 2-3 Monitor platelets by anticoagulation protocol: Yes   Plan:  Repeat Warfarin 7.5 mg po x 1 dose at 1800 pm Daily INR  Thank you Anette Guarneri, PharmD [phone]  12/05/2018,8:24 AM

## 2018-12-05 NOTE — ED Provider Notes (Signed)
  Physical Exam  BP 93/61 (BP Location: Right Arm)   Pulse 62   Temp 97.8 F (36.6 C) (Oral)   Resp 18   Ht 5' 5.5" (1.664 m)   Wt (!) 183.3 kg   SpO2 98%   BMI 66.21 kg/m   Physical Exam  ED Course/Procedures     .Sedation  Date/Time: 12/05/2018 6:58 AM Performed by: Davonna Belling, MD Authorized by: Davonna Belling, MD   Consent:    Consent obtained:  Written   Consent given by:  Patient   Risks discussed:  Allergic reaction, dysrhythmia, inadequate sedation, nausea, vomiting, prolonged hypoxia resulting in organ damage and respiratory compromise necessitating ventilatory assistance and intubation Universal protocol:    Site/side marked: yes     Immediately prior to procedure a time out was called: yes     Patient identity confirmation method:  Arm band and verbally with patient Indications:    Procedure performed:  Fracture reduction   Procedure necessitating sedation performed by:  Different physician Pre-sedation assessment:    Time since last food or drink:  4   NPO status caution: urgency dictates proceeding with non-ideal NPO status     ASA classification: class 3 - patient with severe systemic disease     Neck mobility: reduced     Mallampati score:  IV - only hard palate visible   Pre-sedation assessments completed and reviewed: airway patency, cardiovascular function, mental status, pain level, respiratory function and temperature     Pre-sedation assessments completed and reviewed: nausea/vomiting not reviewed   Immediate pre-procedure details:    Reassessment: Patient reassessed immediately prior to procedure     Verified: bag valve mask available, emergency equipment available, intubation equipment available, IV patency confirmed and oxygen available   Procedure details (see MAR for exact dosages):    Preoxygenation:  Nasal cannula   Sedation:  Ketamine   Intended level of sedation: deep   Intra-procedure monitoring:  Blood pressure monitoring,  continuous capnometry, continuous pulse oximetry, cardiac monitor, frequent LOC assessments and frequent vital sign checks   Intra-procedure events: none     Total Provider sedation time (minutes):  10 Post-procedure details:    Attendance: Constant attendance by certified staff until patient recovered     Recovery: Patient returned to pre-procedure baseline     Post-sedation assessments completed and reviewed: airway patency, cardiovascular function, mental status, respiratory function and temperature     Post-sedation assessments completed and reviewed: nausea/vomiting not reviewed     Patient tolerance:  Tolerated well, no immediate complications    MDM  Patient with open ankle fracture.  Will be taken the OR but orthopedics requests sedation in the ER.  Sedated with ketamine.  Improved alignment.  Patient was somewhat high risk in terms of sedation, however urgent need for the reduction required this sedation.       Davonna Belling, MD 12/05/18 [phone]

## 2018-12-05 NOTE — Progress Notes (Signed)
PROGRESS NOTE    [name]  [id number] DOB: 01-May-1945 DOA: 12/03/2018 PCP: Patient, No Pcp Per   Brief Narrative:  Patient is a 36-year female with history of hypertension, CHF, permanent A. fib on Coumadin, COPD, on oxygen at 2 to 3 L/min at home, CKD stage III, morbid obesity who presented with left ankle pain.  Imagings done in the emergency department showed open fracture /dislocation of left ankle joint.  Orthopedics was consulted and she underwent irrigation and debridement of the left open ankle fracture, ORIF with external fixation placement.  She was also found to have severe vitamin D deficiency and started on vitamin D supplementation.  Awaiting PT/OT evaluation today.She will need SNF on discharge.  Assessment & Plan:   Active Problems:   Essential hypertension   Permanent atrial fibrillation (HCC)   Open fracture dislocation of ankle   Type III open trimalleolar fracture of ankle   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HCC)   Chronic anticoagulation   Anemia of chronic disease   Open ankle fracture dislocation: Imagings showed Lateral dislocation of the talus out of the tibiotalar joint with a transverse fracture through the lateral malleolus and a large skin laceration over the medial malleolus.  Orthopedics following. She underwent irrigation and debridement of the left open ankle fracture, ORIF with external fixation placement on 12/03/18.  Continue  dressing.  Not weightbearing on the left leg.  Continue pain management.  She has been given few days of Rocephin post OR.  Waiting for PT/OT evaluation.  Most likely she will need skilled nursing facility on discharge.  AKI on CKD stage III/IV: Baseline creatinine ranged from 1.5-1.8.  Creatinine slightly trended up today.  Will start on gentle IV fluids.  Combined systolic/diastolic CHF: Patient currently euvolemic/slightly dehydrated.  Last ejection fraction of 40 to 45% as per echocardiogram done in  3/20.  She is on Lasix at home 3 times a week.  Lasix on hold due to acute kidney injury.  Permanent Atrial fibrillation: Currently in normal sinus rhythm.  Heart rate well controlled.  On anticoagulation with warfarin.  Warfarin restarted.  Vitamin D deficiency.  Started on supplementation.  COPD: On home oxygen at 2 to 3 L/min.  Currently COPD stable.  Denies any shortness of breath or wheezing.  Continue supplemental oxygen as needed.  Normocytic anemia: Hemoglobin dropped from 11.4-8.5.  Could be associated with surgery.    Continue to monitor  Hypertension: Currently blood pressure stable.  Continue current medicines  Thrombocytopenia: Chronic.  Stable.  Continue to monitor  Morbid obesity: BMI of 66.2           DVT prophylaxis:Coumadin Code Status: Full Family Communication: Tried to call daughter several times.Says number not in service.I have requested RN to put updated number Disposition Plan: Most likely SNF.Waiting for PT/OT   Consultants: Ortho  Procedures: I&D, ORIF, external fixator placement  Antimicrobials:  Anti-infectives (From admission, onward)   Start     Dose/Rate Route Frequency Ordered Stop   12/04/18 0600  ceFAZolin (ANCEF) 3 g in dextrose 5 % 50 mL IVPB     3 g 100 mL/hr over 30 Minutes Intravenous On call to O.R. 12/03/18 1419 12/03/18 1526   12/03/18 2100  cefTRIAXone (ROCEPHIN) 2 g in sodium chloride 0.9 % 100 mL IVPB     2 g 200 mL/hr over 30 Minutes Intravenous Every 24 hours 12/03/18 1858 12/05/18 2359   12/03/18 1631  tobramycin (NEBCIN) powder  Status:  Discontinued  As needed 12/03/18 1631 12/03/18 1743   12/03/18 1631  vancomycin (VANCOCIN) powder  Status:  Discontinued       As needed 12/03/18 1631 12/03/18 1743      Subjective:  Patient seen and examined the bedside this morning.  Hemodynamically stable.  Comfortable.  Pain well controlled.  Denies any complaints.  Objective: Vitals:   12/04/18 1311 12/04/18 2134  12/05/18 0520 12/05/18 0843  BP: 99/63 (!) 95/54 93/61 100/66  Pulse: 98 93 62 78  Resp: 17 18 18    Temp:  98 F (36.7 C) 97.8 F (36.6 C)   TempSrc:  Oral Oral   SpO2: 97% 97% 98%   Weight:      Height:        Intake/Output Summary (Last 24 hours) at 12/05/2018 1301 Last data filed at 12/05/2018 0315 Gross per 24 hour  Intake 400 ml  Output --  Net 400 ml   Filed Weights   12/03/18 0950  Weight: (!) 183.3 kg    Examination:  General exam: Appears calm and comfortable ,Not in distress, morbidly obese  HEENT:PERRL,Oral mucosa moist, Ear/Nose normal on gross exam Respiratory system: Bilateral equal air entry, normal vesicular breath sounds, no wheezes or crackles  Cardiovascular system: afib. No JVD, murmurs, rubs, gallops or clicks. No pedal edema. Gastrointestinal system: Abdomen is obese, soft and nontender. No organomegaly or masses felt. Normal bowel sounds heard. Central nervous system: Alert and oriented. No focal neurological deficits. Extremities: No edema, no clubbing ,no cyanosis, wound VAC, external fixator on the left lower extremity  skin: No rashes, lesions or ulcers,no icterus ,no pallor   Data Reviewed: I have personally reviewed following labs and imaging studies  CBC: Recent Labs  Lab 12/03/18 1002 12/04/18 0204 12/05/18 0257  WBC 5.5 8.9 9.5  NEUTROABS 3.1  --  6.5  HGB 11.4* 11.0* 8.5*  HCT 36.8 34.3* 26.9*  MCV 101.4* 98.8 98.5  PLT 143* 143* 123XX123*   Basic Metabolic Panel: Recent Labs  Lab 12/03/18 1002 12/05/18 0257  NA 139 137  K 4.9 4.0  CL 100 99  CO2 25 26  GLUCOSE 131* 116*  BUN 23 38*  CREATININE 1.59* 2.34*  CALCIUM 9.1 8.6*   GFR: Estimated Creatinine Clearance: 35.5 mL/min (A) (by C-G formula based on SCr of 2.34 mg/dL (H)). Liver Function Tests: No results for input(s): AST, ALT, ALKPHOS, BILITOT, PROT, ALBUMIN in the last 168 hours. No results for input(s): LIPASE, AMYLASE in the last 168 hours. No results for  input(s): AMMONIA in the last 168 hours. Coagulation Profile: Recent Labs  Lab 12/03/18 1000 12/05/18 0257  INR 2.6* 1.7*   Cardiac Enzymes: No results for input(s): CKTOTAL, CKMB, CKMBINDEX, TROPONINI in the last 168 hours. BNP (last 3 results) No results for input(s): PROBNP in the last 8760 hours. HbA1C: No results for input(s): HGBA1C in the last 72 hours. CBG: Recent Labs  Lab 12/05/18 0744  GLUCAP 133*   Lipid Profile: No results for input(s): CHOL, HDL, LDLCALC, TRIG, CHOLHDL, LDLDIRECT in the last 72 hours. Thyroid Function Tests: No results for input(s): TSH, T4TOTAL, FREET4, T3FREE, THYROIDAB in the last 72 hours. Anemia Panel: No results for input(s): VITAMINB12, FOLATE, FERRITIN, TIBC, IRON, RETICCTPCT in the last 72 hours. Sepsis Labs: No results for input(s): PROCALCITON, LATICACIDVEN in the last 168 hours.  Recent Results (from the past 240 hour(s))  SARS Coronavirus 2 [facility] order, Performed in [facility] hospital lab) Nasopharyngeal Nasopharyngeal Swab     Status: None  Collection Time: 12/03/18 10:11 AM   Specimen: Nasopharyngeal Swab  Result Value Ref Range Status   SARS Coronavirus 2 NEGATIVE NEGATIVE Final    Comment: (NOTE) If result is NEGATIVE SARS-CoV-2 target nucleic acids are NOT DETECTED. The SARS-CoV-2 RNA is generally detectable in upper and lower  respiratory specimens during the acute phase of infection. The lowest  concentration of SARS-CoV-2 viral copies this assay can detect is 250  copies / mL. A negative result does not preclude SARS-CoV-2 infection  and should not be used as the sole basis for treatment or other  patient management decisions.  A negative result may occur with  improper specimen collection / handling, submission of specimen other  than nasopharyngeal swab, presence of viral mutation(s) within the  areas targeted by this assay, and inadequate number of viral copies  (<250 copies / mL). A negative result must be  combined with clinical  observations, patient history, and epidemiological information. If result is POSITIVE SARS-CoV-2 target nucleic acids are DETECTED. The SARS-CoV-2 RNA is generally detectable in upper and lower  respiratory specimens dur ing the acute phase of infection.  Positive  results are indicative of active infection with SARS-CoV-2.  Clinical  correlation with patient history and other diagnostic information is  necessary to determine patient infection status.  Positive results do  not rule out bacterial infection or co-infection with other viruses. If result is PRESUMPTIVE POSTIVE SARS-CoV-2 nucleic acids MAY BE PRESENT.   A presumptive positive result was obtained on the submitted specimen  and confirmed on repeat testing.  While 2019 novel coronavirus  (SARS-CoV-2) nucleic acids may be present in the submitted sample  additional confirmatory testing may be necessary for epidemiological  and / or clinical management purposes  to differentiate between  SARS-CoV-2 and other Sarbecovirus currently known to infect humans.  If clinically indicated additional testing with an alternate test  methodology [phone]) is advised. The SARS-CoV-2 RNA is generally  detectable in upper and lower respiratory sp ecimens during the acute  phase of infection. The expected result is Negative. Fact Sheet for Patients:  StrictlyIdeas.no Fact Sheet for Healthcare Providers: BankingDealers.co.za This test is not yet approved or cleared by the [place] FDA and has been authorized for detection and/or diagnosis of SARS-CoV-2 by FDA under an Emergency Use Authorization (EUA).  This EUA will remain in effect (meaning this test can be used) for the duration of the COVID-19 declaration under Section 564(b)(1) of the Act, 21 U.S.C. section 360bbb-3(b)(1), unless the authorization is terminated or revoked sooner. Performed at [facility], [place] [address] [place], [place] [address]          Radiology Studies: Dg Ankle Complete Left  Result Date: 12/03/2018 CLINICAL DATA:  Status post ORIF left ankle. External fixator placement left lower leg. EXAM: LEFT ANKLE COMPLETE - 3+ VIEW COMPARISON:  Preoperative radiographs earlier this day. FINDINGS: Lateral plate and screw fixation of distal fibular fracture. Two additional syndesmotic screws. Improved alignment of the ankle mortise. Fixator pin through the calcaneus and mid tibial shaft, partially included. Nondisplaced posterior malleolar fracture. Irregularity about the distal aspect of the medial malleolus. Generalized soft tissue edema with scattered soft tissue air. A wound VAC is in place. IMPRESSION: ORIF of distal fibular fracture with external fixator in place. Improved alignment of the ankle mortise. Nondisplaced posterior malleolar fracture better appreciated on the current exam than on preoperative imaging. Electronically Signed   By: Keith Rake M.D.   On: 12/03/2018 20:03  Dg Ankle Complete Left  Result Date: 12/03/2018 CLINICAL DATA:  Ankle fracture EXAM: LEFT ANKLE COMPLETE - 3+ VIEW; DG C-ARM 1-60 MIN COMPARISON:  12/03/2018 FINDINGS: Eight low resolution intraoperative spot views of the ankle. Total fluoroscopy time was 2 minutes 47 seconds. Initial images demonstrate open fracture deformity medial side of the ankle with residual lateral dislocation of the talus with respect to the distal tibia. Subsequent images demonstrate surgical plate and multiple screw fixation of the distal fibula with 2 long fixating screws across the tibiofibular articulation. IMPRESSION: Intraoperative fluoroscopic assistance provided during surgical fixation of ankle fractures Electronically Signed   By: Donavan Foil M.D.   On: 12/03/2018 19:30   Dg C-arm 1-60 Min  Result Date: 12/03/2018 CLINICAL DATA:  Ankle fracture EXAM: LEFT ANKLE COMPLETE - 3+ VIEW; DG C-ARM 1-60 MIN COMPARISON:   12/03/2018 FINDINGS: Eight low resolution intraoperative spot views of the ankle. Total fluoroscopy time was 2 minutes 47 seconds. Initial images demonstrate open fracture deformity medial side of the ankle with residual lateral dislocation of the talus with respect to the distal tibia. Subsequent images demonstrate surgical plate and multiple screw fixation of the distal fibula with 2 long fixating screws across the tibiofibular articulation. IMPRESSION: Intraoperative fluoroscopic assistance provided during surgical fixation of ankle fractures Electronically Signed   By: Donavan Foil M.D.   On: 12/03/2018 19:30        Scheduled Meds:  acetaminophen  1,000 mg Oral Q6H   cholecalciferol  5,000 Units Oral Daily   gabapentin  100 mg Oral TID   metoprolol succinate  100 mg Oral Daily   Vitamin D (Ergocalciferol)  50,000 Units Oral Q7 days   warfarin  7.5 mg Oral ONCE-1800   Warfarin - Pharmacist Dosing Inpatient   Does not apply q1800   Continuous Infusions:  sodium chloride 75 mL/hr at 12/05/18 0849   cefTRIAXone (ROCEPHIN)  IV 2 g (12/04/18 2140)     LOS: 2 days    Time spent: 35 mins.More than 50% of that time was spent in counseling and/or coordination of care.      Shelly Coss, MD [facility] Pager [phone]  If 7PM-7AM, please contact night-coverage [email or web address] Password [email or web address] 12/05/2018, 1:01 PM

## 2018-12-06 LAB — CBC WITH DIFFERENTIAL/PLATELET
Abs Immature Granulocytes: 0.03 10*3/uL (ref 0.00–0.07)
Basophils Absolute: 0 10*3/uL (ref 0.0–0.1)
Basophils Relative: 0 %
Eosinophils Absolute: 0.5 10*3/uL (ref 0.0–0.5)
Eosinophils Relative: 6 %
HCT: 25.1 % — ABNORMAL LOW (ref 36.0–46.0)
Hemoglobin: 8.3 g/dL — ABNORMAL LOW (ref 12.0–15.0)
Immature Granulocytes: 0 %
Lymphocytes Relative: 31 %
Lymphs Abs: 2.4 10*3/uL (ref 0.7–4.0)
MCH: 32.4 pg (ref 26.0–34.0)
MCHC: 33.1 g/dL (ref 30.0–36.0)
MCV: 98 fL (ref 80.0–100.0)
Monocytes Absolute: 0.8 10*3/uL (ref 0.1–1.0)
Monocytes Relative: 10 %
Neutro Abs: 4.1 10*3/uL (ref 1.7–7.7)
Neutrophils Relative %: 53 %
Platelets: 116 10*3/uL — ABNORMAL LOW (ref 150–400)
RBC: 2.56 MIL/uL — ABNORMAL LOW (ref 3.87–5.11)
RDW: 13.7 % (ref 11.5–15.5)
WBC: 7.8 10*3/uL (ref 4.0–10.5)
nRBC: 0 % (ref 0.0–0.2)

## 2018-12-06 LAB — PROTIME-INR
INR: 1.9 — ABNORMAL HIGH (ref 0.8–1.2)
Prothrombin Time: 21.1 seconds — ABNORMAL HIGH (ref 11.4–15.2)

## 2018-12-06 LAB — BASIC METABOLIC PANEL
Anion gap: 11 (ref 5–15)
BUN: 40 mg/dL — ABNORMAL HIGH (ref 8–23)
CO2: 26 mmol/L (ref 22–32)
Calcium: 8.5 mg/dL — ABNORMAL LOW (ref 8.9–10.3)
Chloride: 100 mmol/L (ref 98–111)
Creatinine, Ser: 2.15 mg/dL — ABNORMAL HIGH (ref 0.44–1.00)
GFR calc Af Amer: 25 mL/min — ABNORMAL LOW (ref >60)
GFR calc non Af Amer: 22 mL/min — ABNORMAL LOW (ref >60)
Glucose, Bld: 90 mg/dL (ref 70–99)
Potassium: 4.4 mmol/L (ref 3.5–5.1)
Sodium: 137 mmol/L (ref 135–145)

## 2018-12-06 MED ORDER — INFLUENZA VAC A&B SA ADJ QUAD 0.5 ML IM PRSY
0.5000 mL | PREFILLED_SYRINGE | INTRAMUSCULAR | Status: DC
  Filled 2018-12-06: qty 0.5

## 2018-12-06 MED ORDER — WARFARIN SODIUM 7.5 MG PO TABS
7.5000 mg | ORAL_TABLET | Freq: Once | ORAL | Status: AC
  Administered 2018-12-06: 17:00:00 7.5 mg via ORAL
  Filled 2018-12-06: qty 1

## 2018-12-06 NOTE — Progress Notes (Signed)
Physical Therapy Treatment Patient Details Name: Jean Stewart MRN: [id number] DOB: Mar 17, 1945 Today's Date: 12/06/2018    History of Present Illness Patient is a 73 year old female admitted after fall with L ankle fracture. PMH includes: HTN, morbid obesity, Afib, COPD.    PT Comments    Pt was seen for mobility on side of bed, initially in a lot of pain despite meds and then with more comfort as she moved with assistance.  Pt is still appropriate for SNF placement as she is requiring a significant amount of help for transfers and for control of balance, and this will need to be more automatic for a safe transition home.  Follow acutely to minimize time needed for SNF stay.  Sliding board vs bed pad to assist transfers to chair will be explored.   Follow Up Recommendations  SNF     Equipment Recommendations  None recommended by PT    Recommendations for Other Services       Precautions / Restrictions Precautions Precautions: Fall Precaution Comments: external fixator on L LE Required Braces or Orthoses: Other Brace Restrictions Weight Bearing Restrictions: Yes LLE Weight Bearing: Non weight bearing    Mobility  Bed Mobility Overal bed mobility: Needs Assistance Bed Mobility: Supine to Sit;Sit to Supine     Supine to sit: Mod assist Sit to supine: Mod assist   General bed mobility comments: pt was able to help initiate movement to reduce her pain  Transfers Overall transfer level: Needs assistance Equipment used: None Transfers: Lateral/Scoot Transfers          Lateral/Scoot Transfers: Mod assist General transfer comment: scooting up the bed with bedrail to assist, making good headway but then fatigues  Ambulation/Gait                 Stairs             Wheelchair Mobility    Modified Rankin (Stroke Patients Only)       Balance Overall balance assessment: Needs assistance Sitting-balance support: Feet supported;Bilateral upper  extremity supported Sitting balance-Leahy Scale: Fair Sitting balance - Comments: PT is supporting LLE                                    Cognition Arousal/Alertness: Awake/alert Behavior During Therapy: WFL for tasks assessed/performed Overall Cognitive Status: Within Functional Limits for tasks assessed                                        Exercises General Exercises - Lower Extremity Ankle Circles/Pumps: AAROM;Right;5 reps Long Arc Quad: AAROM;Strengthening;10 reps;Both Heel Slides: Strengthening;AAROM;Both;10 reps Hip ABduction/ADduction: AAROM;AROM;Both;10 reps    General Comments General comments (skin integrity, edema, etc.): pt is up to side of bed with help, then back with pt having done ex on side of bed.  Pt is careful with moving, helped her with bed pad and trendelenburg to scoot up bed      Pertinent Vitals/Pain Pain Assessment: Faces Faces Pain Scale: Hurts even more Pain Location: LLE intermittently with certain movements Pain Descriptors / Indicators: Operative site guarding;Grimacing Pain Intervention(s): Limited activity within patient's tolerance;Monitored during session;Premedicated before session;Repositioned    Home Living                      Prior Function  PT Goals (current goals can now be found in the care plan section) Acute Rehab PT Goals Patient Stated Goal: to get stronger Progress towards PT goals: Progressing toward goals    Frequency    Min 3X/week      PT Plan Current plan remains appropriate    Co-evaluation              AM-PAC PT "6 Clicks" Mobility   Outcome Measure  Help needed turning from your back to your side while in a flat bed without using bedrails?: A Little Help needed moving from lying on your back to sitting on the side of a flat bed without using bedrails?: A Lot Help needed moving to and from a bed to a chair (including a wheelchair)?: A Lot Help  needed standing up from a chair using your arms (e.g., wheelchair or bedside chair)?: Total Help needed to walk in hospital room?: Total Help needed climbing 3-5 steps with a railing? : Total 6 Click Score: 10    End of Session   Activity Tolerance: Patient tolerated treatment well;Patient limited by fatigue Patient left: with call bell/phone within reach;in bed;with bed alarm set Nurse Communication: Mobility status;Need for lift equipment PT Visit Diagnosis: Muscle weakness (generalized) (M62.81);Difficulty in walking, not elsewhere classified (R26.2);Other abnormalities of gait and mobility (R26.89);History of falling (Z91.81)     Time: [id number] PT Time Calculation (min) (ACUTE ONLY): 31 min  Charges:  $Therapeutic Exercise: 8-22 mins $Therapeutic Activity: 8-22 mins                    Ramond Dial 12/06/2018, 7:33 PM   Mee Hives, PT MS Acute Rehab Dept. Number: [place] and [place]

## 2018-12-06 NOTE — Progress Notes (Signed)
Orthopaedic Trauma Progress Note  S: Doing well, no complaints. Pain controlled. Was able to get up with therapy yesterday  O:  Vitals:   12/05/18 2122 12/06/18 0603  BP: 100/66 (!) 97/55  Pulse: 73 64  Resp: 17 17  Temp: 98.3 F (36.8 C) 97.7 F (36.5 C)  SpO2: 100% 100%    Gen: Laying in bed, NAD, AAOx3 LLE: Ex-fix in place, incisional wound vac with minimal drainage. Dressing and vac  Removed, incisions are clean, dry, intact. No surrounding erythema. Tender with palpation of ankle. Wiggles toes and sensation grossly intact. 2+ DP pulse  Imaging: Stable postop imaging  Labs:  Results for orders placed or performed during the hospital encounter of 12/03/18 (from the past 24 hour(s))  Protime-INR     Status: Abnormal   Collection Time: 12/06/18  2:28 AM  Result Value Ref Range   Prothrombin Time 21.1 (H) 11.4 - 15.2 seconds   INR 1.9 (H) 0.8 - 1.2  Basic metabolic panel     Status: Abnormal   Collection Time: 12/06/18  2:28 AM  Result Value Ref Range   Sodium 137 135 - 145 mmol/L   Potassium 4.4 3.5 - 5.1 mmol/L   Chloride 100 98 - 111 mmol/L   CO2 26 22 - 32 mmol/L   Glucose, Bld 90 70 - 99 mg/dL   BUN 40 (H) 8 - 23 mg/dL   Creatinine, Ser 2.15 (H) 0.44 - 1.00 mg/dL   Calcium 8.5 (L) 8.9 - 10.3 mg/dL   GFR calc non Af Amer 22 (L) >60 mL/min   GFR calc Af Amer 25 (L) >60 mL/min   Anion gap 11 5 - 15  CBC with Differential/Platelet     Status: Abnormal   Collection Time: 12/06/18  2:28 AM  Result Value Ref Range   WBC 7.8 4.0 - 10.5 K/uL   RBC 2.56 (L) 3.87 - 5.11 MIL/uL   Hemoglobin 8.3 (L) 12.0 - 15.0 g/dL   HCT 25.1 (L) 36.0 - 46.0 %   MCV 98.0 80.0 - 100.0 fL   MCH 32.4 26.0 - 34.0 pg   MCHC 33.1 30.0 - 36.0 g/dL   RDW 13.7 11.5 - 15.5 %   Platelets 116 (L) 150 - 400 K/uL   nRBC 0.0 0.0 - 0.2 %   Neutrophils Relative % 53 %   Neutro Abs 4.1 1.7 - 7.7 K/uL   Lymphocytes Relative 31 %   Lymphs Abs 2.4 0.7 - 4.0 K/uL   Monocytes Relative 10 %   Monocytes  Absolute 0.8 0.1 - 1.0 K/uL   Eosinophils Relative 6 %   Eosinophils Absolute 0.5 0.0 - 0.5 K/uL   Basophils Relative 0 %   Basophils Absolute 0.0 0.0 - 0.1 K/uL   Immature Granulocytes 0 %   Abs Immature Granulocytes 0.03 0.00 - 0.07 K/uL    Assessment: 73 year old female with a history of atrial fibrillation on Coumadin, morbid obesity, hypertension and COPD s/p fall  Injuries: L type IIIA open ankle fracture dislocation   Weightbearing: NWB  Insicional and dressing care: Change dressing as needed. Kerlix to ex fix pin site, change PRN  CV/Blood loss: Acute blood loss anemia, Hgb 8.3 this AM. Continue to monitor CBC  Pain management: 1. Oxycodone 5-10 mg PRN 2. Robaxin 500 mg PRN 3. Dilaudid 1 mg PRN 4. Gabapentin 100 mg TID 5. Scheduled tylenol 1000 mg q 6 hrs  VTE prophylaxis: Coumadin  ID: Ceftriaxone 2 gm x48 hours completed  Medical co-morbidities:  Per internal medicine   Impediments to Fracture Healing: Severe vitamin D deficiency- continue vitamin D3 daily and vitamin D2 weekly  Dispo: Up with therapy. Will need SNF placement. Okay to discharge form ortho standpoint once cleared by medicine team. Continue Vitamin D supplement at discharge  Follow - up plan: 2 weeks with orthopaedics for repeat x-rays, suture removal   Levoy Geisen A. Carmie [facility] ?[phone]? (phone) [email or web address]

## 2018-12-06 NOTE — Plan of Care (Signed)
  Problem: Education: Goal: Ability to state activities that reduce stress will improve Outcome: Progressing   Problem: Clinical Measurements: Goal: Ability to maintain clinical measurements within normal limits will improve Outcome: Progressing Note: Slow progress being made towards goals. Goal: Will remain free from infection Outcome: Progressing Note: Patient currently on antibiotic therapy prophylaxis, post-surgical procedure Goal: Diagnostic test results will improve Outcome: Progressing Goal: Respiratory complications will improve Outcome: Progressing Goal: Cardiovascular complication will be avoided Outcome: Progressing   Problem: Pain Managment: Goal: General experience of comfort will improve Outcome: Progressing   Problem: Safety: Goal: Ability to remain free from injury will improve Outcome: Progressing   Problem: Skin Integrity: Goal: Risk for impaired skin integrity will decrease Outcome: Progressing

## 2018-12-06 NOTE — Progress Notes (Signed)
ANTICOAGULATION CONSULT NOTE - Follow Up Consult  Pharmacy Consult for Coumadin Indication: atrial fibrillation  No Known Allergies  Patient Measurements: Height: 5' 5.5" (166.4 cm) Weight: (!) 404 lb (183.3 kg) IBW/kg (Calculated) : 58.15  Vital Signs: Temp: 97.7 F (36.5 C) (10/01 0603) Temp Source: Oral (10/01 0603) BP: 97/55 (10/01 0603) Pulse Rate: 64 (10/01 0603)  Labs: Recent Labs    12/03/18 1000  12/03/18 1002 12/04/18 0204 12/05/18 0257 12/06/18 0228  HGB  --    < > 11.4* 11.0* 8.5* 8.3*  HCT  --    < > 36.8 34.3* 26.9* 25.1*  PLT  --    < > 143* 143* 117* 116*  LABPROT 27.3*  --   --   --  19.8* 21.1*  INR 2.6*  --   --   --  1.7* 1.9*  CREATININE  --   --  1.59*  --  2.34* 2.15*   < > = values in this interval not displayed.    Estimated Creatinine Clearance: 38.6 mL/min (A) (by C-G formula based on SCr of 2.15 mg/dL (H)).   Assessment:  Anticoag: On Warfarin PTA for Afib, INR 1.9. Hgb 11.4 baseline>>8.3 today. Plts down to 116 - 9/28: Vit K 5mg  IV - PTA: INR 2.6 on admission on Coumadin 5 mg daily  Goal of Therapy:  INR 2-3 Monitor platelets by anticoagulation protocol: Yes   Plan:  Repeat Coumadin 7.5 mg po x 1 Daily INR   Nickoles Gregori S. Alford Highland, PharmD, [facility] Clinical Staff Pharmacist Eilene Ghazi Stillinger 12/06/2018,8:47 AM

## 2018-12-06 NOTE — TOC Initial Note (Addendum)
Transition of Care [facility]) - Initial/Assessment Note    Patient Details  Name: Jean Stewart MRN: [id number] Date of Birth: [birth year]  Transition of Care [facility]) CM/SW Contact:    Sharin Mons, RN Phone Number: 12/06/2018, 10:49 AM  Clinical Narrative:   Admitted with L ankle fx. Hx hypertension, CHF, permanent A. fib on Coumadin, COPD, on oxygen at 2 to 3 L/min at home, CKD stage III, morbid obesity. From home with  Daughter, Deloris. States PTA required help with ADL's. DME: oxgen, W/C, walker.     Deloris Bram (Daughter)     [phone]      S/p  Irrigation and debridement of left open ankle fracture,Open reduction internal fixation of left trimalleolar ankle fracture ,External fixation of left ankle, 12/03/2018.  NCM shared PT's evaluation /recommendation : SNF;Supervision for mobility/OOB.   Patient reported that her daughter is currently unable to care for her  at their home given her current physical needs and fall risk. Patient expressed understanding of PT recommendation and is agreeable to SNF placement at time of discharge. Patient reports has  preference for SNF. NCM discussed insurance authorization process and provided Medicare SNF ratings list. Patient expressed being hopeful for rehab and to feel better soon. No further questions reported at this time. NCM to continue to follow and assist with discharge planning needs.     Expected Discharge Plan: Skilled Nursing Facility Barriers to Discharge: Continued Medical Work up   Patient Goals and CMS Choice Patient states their goals for this hospitalization and ongoing recovery are:: to go to rehab and get better CMS Medicare.gov Compare Post Acute Care list provided to:: Patient Choice offered to / list presented to : Patient  Expected Discharge Plan and Services Expected Discharge Plan: [place]   Discharge Planning Services: CM Consult   Living arrangements for the past 2 months: Single  Family Home Expected Discharge Date: 12/06/18               DME Arranged: N/A DME Agency: NA       HH Arranged: NA HH Agency: NA        Prior Living Arrangements/Services Living arrangements for the past 2 months: Single Family Home Lives with:: Adult Children Patient language and need for interpreter reviewed:: Yes Do you feel safe going back to the place where you live?: Yes      Need for Family Participation in Patient Care: Yes (Comment) Care giver support system in place?: Yes (comment)   Criminal Activity/Legal Involvement Pertinent to Current Situation/Hospitalization: No - Comment as needed  Activities of Daily Living Home Assistive Devices/Equipment: Gilford Rile (specify type) ADL Screening (condition at time of admission) Patient's cognitive ability adequate to safely complete daily activities?: Yes Is the patient deaf or have difficulty hearing?: No Does the patient have difficulty seeing, even when wearing glasses/contacts?: No Does the patient have difficulty concentrating, remembering, or making decisions?: No Patient able to express need for assistance with ADLs?: Yes Does the patient have difficulty dressing or bathing?: Yes Independently performs ADLs?: No Communication: Independent Dressing (OT): Needs assistance Is this a change from baseline?: Pre-admission baseline Grooming: Needs assistance Is this a change from baseline?: Pre-admission baseline Feeding: Independent Bathing: Independent Toileting: Needs assistance Is this a change from baseline?: Pre-admission baseline In/Out Bed: Needs assistance Is this a change from baseline?: Pre-admission baseline Walks in Home: Needs assistance Is this a change from baseline?: Change from baseline, expected to last <3 days Does the patient have difficulty walking or  climbing stairs?: Yes Weakness of Legs: Both Weakness of Arms/Hands: None  Permission Sought/Granted Permission sought to share information with :  Case Manager, Family Supports Permission granted to share information with : Yes, Verbal Permission Granted  Share Information with NAME: Deloris Payano (Daughter)[phone]           Emotional Assessment Appearance:: Appears stated age Attitude/Demeanor/Rapport: Gracious Affect (typically observed): Accepting Orientation: : Oriented to Self, Oriented to Place, Oriented to  Time, Oriented to Situation Alcohol / Substance Use: Not Applicable Psych Involvement: No (comment)  Admission diagnosis:  Fracture [T14.8XXA] Fall [W19.XXXA] Patient Active Problem List   Diagnosis Date Noted  . Open fracture dislocation of ankle 12/04/2018  . Type III open trimalleolar fracture of ankle 12/04/2018  . 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 Chronic anticoagulation 12/04/2018  . Anemia of chronic disease 12/04/2018  . Pressure injury of skin 05/12/2018  . Atrial fibrillation with RVR ([place]) 05/11/2018  . Obesity, Class III, BMI 40-49.9 (morbid obesity) ([place]) 05/11/2018  . Essential hypertension 05/11/2018  . Renal insufficiency 05/11/2018  . Permanent atrial fibrillation ([place]) 05/11/2018  . Acute respiratory failure with hypoxia and hypercapnia ([place]) 05/11/2018  . Elevated troponin 05/11/2018   PCP:  Patient, No Pcp Per Pharmacy:   [facility] [address] ([address] [place] [id number] [place] [address] Phone: [phone] Fax: [phone]     Social Determinants of Health (SDOH) Interventions    Readmission Risk Interventions No flowsheet data found.

## 2018-12-06 NOTE — Discharge Instructions (Addendum)
[facility] Discharge Instructions   General Discharge Instructions  WEIGHT BEARING STATUS: Non-weightbearing left leg  RANGE OF MOTION/ACTIVITY: Okay for unrestricted knee range of motion  Wound Care: Change pin site and ace bandage dressings as needed. Incisions can be left open to air if there is no drainage. If incision continues to have drainage, follow wound care instructions below. Okay to shower if no drainage from incisions. Let soap and water run over incisions, do not scrubs over incisions.  DVT/PE prophylaxis: Coumadin  Diet: as you were eating previously.  Can use over the counter stool softeners and bowel preparations, such as Miralax, to help with bowel movements.  Narcotics can be constipating.  Be sure to drink plenty of fluids  PAIN MEDICATION USE AND EXPECTATIONS  You have likely been given narcotic medications to help control your pain.  After a traumatic event that results in an fracture (broken bone) with or without surgery, it is ok to use narcotic pain medications to help control one's pain.  We understand that everyone responds to pain differently and each individual patient will be evaluated on a regular basis for the continued need for narcotic medications. Ideally, narcotic medication use should last no more than 6-8 weeks (coinciding with fracture healing).   As a patient it is your responsibility as well to monitor narcotic medication use and report the amount and frequency you use these medications when you come to your office visit.   We would also advise that if you are using narcotic medications, you should take a dose prior to therapy to maximize you participation.  IF YOU ARE ON NARCOTIC MEDICATIONS IT IS NOT PERMISSIBLE TO OPERATE A MOTOR VEHICLE (MOTORCYCLE/CAR/TRUCK/MOPED) OR HEAVY MACHINERY DO NOT MIX NARCOTICS WITH OTHER CNS (CENTRAL NERVOUS SYSTEM) DEPRESSANTS SUCH AS ALCOHOL   STOP SMOKING OR USING NICOTINE PRODUCTS!!!!  As  discussed nicotine severely impairs your body's ability to heal surgical and traumatic wounds but also impairs bone healing.  Wounds and bone heal by forming microscopic blood vessels (angiogenesis) and nicotine is a vasoconstrictor (essentially, shrinks blood vessels).  Therefore, if vasoconstriction occurs to these microscopic blood vessels they essentially disappear and are unable to deliver necessary nutrients to the healing tissue.  This is one modifiable factor that you can do to dramatically increase your chances of healing your injury.    (This means no smoking, no nicotine gum, patches, etc)  DO NOT USE NONSTEROIDAL ANTI-INFLAMMATORY DRUGS (NSAID'S)  Using products such as Advil (ibuprofen), Aleve (naproxen), Motrin (ibuprofen) for additional pain control during fracture healing can delay and/or prevent the healing response.  If you would like to take over the counter (OTC) medication, Tylenol (acetaminophen) is ok.  However, some narcotic medications that are given for pain control contain acetaminophen as well. Therefore, you should not exceed more than 4000 mg of tylenol in a day if you do not have liver disease.  Also note that there are may OTC medicines, such as cold medicines and allergy medicines that my contain tylenol as well.  If you have any questions about medications and/or interactions please ask your doctor/PA or your pharmacist.      ICE AND ELEVATE INJURED/OPERATIVE EXTREMITY  Using ice and elevating the injured extremity above your heart can help with swelling and pain control.  Icing in a pulsatile fashion, such as 20 minutes on and 20 minutes off, can be followed.    Do not place ice directly on skin. Make sure there is a barrier between  to skin and the ice pack.    Using frozen items such as frozen peas works well as the conform nicely to the are that needs to be iced.  USE AN ACE WRAP OR TED HOSE FOR SWELLING CONTROL  In addition to icing and elevation, Ace wraps or TED  hose are used to help limit and resolve swelling.  It is recommended to use Ace wraps or TED hose until you are informed to stop.    When using Ace Wraps start the wrapping distally (farthest away from the body) and wrap proximally (closer to the body)   Example: If you had surgery on your leg or thing and you do not have a splint on, start the ace wrap at the toes and work your way up to the thigh        If you had surgery on your upper extremity and do not have a splint on, start the ace wrap at your fingers and work your way up to the upper arm  [place]: [phone]   VISIT OUR WEBSITE FOR ADDITIONAL INFORMATION: [email or web address]      Discharge Pin Site Instructions  Dress pins daily with Kerlix roll starting on POD 2. Wrap the Kerlix so that it tamps the skin down around the pin-skin interface to prevent/limit motion of the skin relative to the pin.  (Pin-skin motion is the primary cause of pain and infection related to external fixator pin sites).  Remove any crust or coagulum that may obstruct drainage with a saline moistened gauze or soap and water.  After POD 3, if there is no discernable drainage on the pin site dressing, the interval for change can by increased to every other day.  You may shower with the fixator, cleaning all pin sites gently with soap and water.  If you have a surgical wound this needs to be completely dry and without drainage before showering.  The extremity can be lifted by the fixator to facilitate wound care and transfers.  Notify the office/Doctor if you experience increasing drainage, redness, or pain from a pin site, or if you notice purulent (thick, snot-like) drainage.  Discharge Wound Care Instructions  Do NOT apply any ointments, solutions or lotions to pin sites or surgical wounds.  These prevent needed drainage and even though solutions like hydrogen peroxide kill bacteria, they also damage cells lining  the pin sites that help fight infection.  Applying lotions or ointments can keep the wounds moist and can cause them to breakdown and open up as well. This can increase the risk for infection. When in doubt call the office.  Surgical incisions should be dressed daily.  If any drainage is noted, use one layer of adaptic, then gauze, Kerlix, and an ace wrap.  Once the incision is completely dry and without drainage, it may be left open to air out.  Showering may begin 36-48 hours later.  Cleaning gently with soap and water.  Traumatic wounds should be dressed daily as well.    One layer of adaptic, gauze, Kerlix, then ace wrap.  The adaptic can be discontinued once the draining has ceased    If you have a wet to dry dressing: wet the gauze with saline the squeeze as much saline out so the gauze is moist (not soaking wet), place moistened gauze over wound, then place a dry gauze over the moist one, followed by Kerlix wrap, then ace wrap.     Information on  my medicine - Coumadin   (Warfarin)  Why was Coumadin prescribed for you? Coumadin was prescribed for you because you have a blood clot or a medical condition that can cause an increased risk of forming blood clots. Blood clots can cause serious health problems by blocking the flow of blood to the heart, lung, or brain. Coumadin can prevent harmful blood clots from forming. As a reminder your indication for Coumadin is:   Stroke Prevention Because Of Atrial Fibrillation  What test will check on my response to Coumadin? While on Coumadin (warfarin) you will need to have an INR test regularly to ensure that your dose is keeping you in the desired range. The INR (international normalized ratio) number is calculated from the result of the laboratory test called prothrombin time (PT).  If an INR APPOINTMENT HAS NOT ALREADY BEEN MADE FOR YOU please schedule an appointment to have this lab work done by your health care provider within 7 days. Your  INR goal is usually a number between:  2 to 3 or your provider may give you a more narrow range like 2-2.5.  Ask your health care provider during an office visit what your goal INR is.  What  do you need to  know  About  COUMADIN? Take Coumadin (warfarin) exactly as prescribed by your healthcare provider about the same time each day.  DO NOT stop taking without talking to the doctor who prescribed the medication.  Stopping without other blood clot prevention medication to take the place of Coumadin may increase your risk of developing a new clot or stroke.  Get refills before you run out.  What do you do if you miss a dose? If you miss a dose, take it as soon as you remember on the same day then continue your regularly scheduled regimen the next day.  Do not take two doses of Coumadin at the same time.  Important Safety Information A possible side effect of Coumadin (Warfarin) is an increased risk of bleeding. You should call your healthcare provider right away if you experience any of the following: ? Bleeding from an injury or your nose that does not stop. ? Unusual colored urine (red or dark brown) or unusual colored stools (red or black). ? Unusual bruising for unknown reasons. ? A serious fall or if you hit your head (even if there is no bleeding).  Some foods or medicines interact with Coumadin (warfarin) and might alter your response to warfarin. To help avoid this: ? Eat a balanced diet, maintaining a consistent amount of Vitamin K. ? Notify your provider about major diet changes you plan to make. ? Avoid alcohol or limit your intake to 1 drink for women and 2 drinks for men per day. (1 drink is 5 oz. wine, 12 oz. beer, or 1.5 oz. liquor.)  Make sure that ANY health care provider who prescribes medication for you knows that you are taking Coumadin (warfarin).  Also make sure the healthcare provider who is monitoring your Coumadin knows when you have started a new medication including  herbals and non-prescription products.  Coumadin (Warfarin)  Major Drug Interactions  Increased Warfarin Effect Decreased Warfarin Effect  Alcohol (large quantities) Antibiotics (esp. Septra/Bactrim, Flagyl, Cipro) Amiodarone (Cordarone) Aspirin (ASA) Cimetidine (Tagamet) Megestrol (Megace) NSAIDs (ibuprofen, naproxen, etc.) Piroxicam (Feldene) Propafenone (Rythmol SR) Propranolol (Inderal) Isoniazid (INH) Posaconazole (Noxafil) Barbiturates (Phenobarbital) Carbamazepine (Tegretol) Chlordiazepoxide (Librium) Cholestyramine (Questran) Griseofulvin Oral Contraceptives Rifampin Sucralfate (Carafate) Vitamin K   Coumadin (Warfarin) Major Herbal Interactions  Increased Warfarin Effect Decreased Warfarin Effect  Garlic Ginseng Ginkgo biloba Coenzyme Q10 Green tea St. Johns wort    Coumadin (Warfarin) FOOD Interactions  Eat a consistent number of servings per week of foods HIGH in Vitamin K (1 serving =  cup)  Collards (cooked, or boiled & drained) Kale (cooked, or boiled & drained) Mustard greens (cooked, or boiled & drained) Parsley *serving size only =  cup Spinach (cooked, or boiled & drained) Swiss chard (cooked, or boiled & drained) Turnip greens (cooked, or boiled & drained)  Eat a consistent number of servings per week of foods MEDIUM-HIGH in Vitamin K (1 serving = 1 cup)  Asparagus (cooked, or boiled & drained) Broccoli (cooked, boiled & drained, or raw & chopped) Brussel sprouts (cooked, or boiled & drained) *serving size only =  cup Lettuce, raw (green leaf, endive, romaine) Spinach, raw Turnip greens, raw & chopped   These websites have more information on Coumadin (warfarin):  FailFactory.se; VeganReport.com.au;

## 2018-12-06 NOTE — Progress Notes (Signed)
PROGRESS NOTE    [name]  [id number] DOB: 14-Jun-1945 DOA: 12/03/2018 PCP: Patient, No Pcp Per   Brief Narrative:  Patient is a 32-year female with history of hypertension, CHF, permanent A. fib on Coumadin, COPD, on oxygen at 2 to 3 L/min at home, CKD stage III, morbid obesity who presented with left ankle pain.  Imagings done in the emergency department showed open fracture /dislocation of left ankle joint.  Orthopedics was consulted and she underwent irrigation and debridement of the left open ankle fracture, ORIF with external fixation placement.  She was also found to have severe vitamin D deficiency and started on vitamin D supplementation.  She will need SNF on discharge.  Assessment & Plan:   Principal Problem:   Open fracture dislocation of ankle Active Problems:   Essential hypertension   Permanent atrial fibrillation (HCC)   Type III open trimalleolar fracture of ankle   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HCC)   Chronic anticoagulation   Anemia of chronic disease   Open ankle fracture dislocation: Imagings showed Lateral dislocation of the talus out of the tibiotalar joint with a transverse fracture through the lateral malleolus and a large skin laceration over the medial malleolus.  Orthopedics following. She underwent irrigation and debridement of the left open ankle fracture, ORIF with external fixation placement on 12/03/18.  Continue  dressing.  Not weightbearing on the left leg.  Continue pain management.  She has been given few days of Rocephin post OR.  Waiting for PT/OT evaluation.  Most likely she will need skilled nursing facility on discharge.  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  Discontinue IV fluids in setting of combined systolic/diastolic heart failure  Combined systolic/diastolic CHF: Compensated.  Last ejection fraction of 40 to 45% as per echocardiogram  done in 3/20.  She is on Lasix at home 3 times a week.  Lasix on hold due to acute kidney injury.  Likely restart Lasix tomorrow  Permanent Atrial fibrillation: Currently in normal sinus rhythm.  Heart rate well controlled.  On anticoagulation with warfarin  Thrombocytopenia continue to monitor  Vitamin D deficiency.  Started on supplementation.  COPD: On home oxygen at 2 to 3 L/min.  Currently COPD stable.  Denies any shortness of breath or wheezing.  Continue supplemental oxygen as needed.  Normocytic anemia: Hemoglobin dropped from 11.4-8.5-> 8.3 today, stable.  Could be associated with surgery.    Continue to monitor  Hypertension: Currently blood pressure stable.  Continue current medicines  Thrombocytopenia: Chronic.  Stable.  Continue to monitor  Morbid obesity: BMI of 66.2           DVT prophylaxis:Coumadin Code Status: Full Family Communication I updated the daughter via phone Disposition Plan: Most likely SNF.Waiting for PT/OT.  Should be medically ready for discharge tomorrow.   Consultants: Ortho  Procedures: I&D, ORIF, external fixator placement  Antimicrobials:  Anti-infectives (From admission, onward)   Start     Dose/Rate Route Frequency Ordered Stop   12/04/18 0600  ceFAZolin (ANCEF) 3 g in dextrose 5 % 50 mL IVPB     3 g 100 mL/hr over 30 Minutes Intravenous On call to O.R. 12/03/18 1419 12/03/18 1526   12/03/18 2100  cefTRIAXone (ROCEPHIN) 2 g in sodium chloride 0.9 % 100 mL IVPB     2 g 200 mL/hr over 30 Minutes Intravenous Every 24 hours 12/03/18 1858 12/06/18 0050   12/03/18 1631  tobramycin (  NEBCIN) powder  Status:  Discontinued       As needed 12/03/18 1631 12/03/18 1743   12/03/18 1631  vancomycin (VANCOCIN) powder  Status:  Discontinued       As needed 12/03/18 1631 12/03/18 1743      Subjective:  Seen and examined at bedside no acute distress and resting comfortably.  Hemodynamically stable, comfortable without any new complaints.   Objective: Vitals:   12/05/18 1358 12/05/18 2122 12/06/18 0603 12/06/18 1246  BP: 102/68 100/66 (!) 97/55 103/66  Pulse: 77 73 64 65  Resp: 18 17 17 15   Temp: 98.7 F (37.1 C) 98.3 F (36.8 C) 97.7 F (36.5 C) (!) 97.2 F (36.2 C)  TempSrc: Oral Oral Oral Oral  SpO2: 97% 100% 100%   Weight:      Height:        Intake/Output Summary (Last 24 hours) at 12/06/2018 1332 Last data filed at 12/06/2018 1142 Gross per 24 hour  Intake 1290.72 ml  Output 1400 ml  Net -109.28 ml   Filed Weights   12/03/18 0950  Weight: (!) 183.3 kg    Examination:  General exam: Appears calm and comfortable ,Not in distress, morbidly obese  HEENT:PERRL,Oral mucosa moist, Ear/Nose normal on gross exam Respiratory system: Bilateral equal air entry, normal vesicular breath sounds, no wheezes or crackles  Cardiovascular system: afib. No JVD, murmurs, rubs, gallops or clicks. No pedal edema. Gastrointestinal system: Abdomen is obese, soft and nontender. No organomegaly or masses felt. Normal bowel sounds heard. Central nervous system: Alert and oriented. No focal neurological deficits. Extremities: No edema, no clubbing ,no cyanosis, wound VAC, external fixator on the left lower extremity  skin: No rashes, lesions or ulcers,no icterus ,no pallor   Data Reviewed: I have personally reviewed following labs and imaging studies  CBC: Recent Labs  Lab 12/03/18 1002 12/04/18 0204 12/05/18 0257 12/06/18 0228  WBC 5.5 8.9 9.5 7.8  NEUTROABS 3.1  --  6.5 4.1  HGB 11.4* 11.0* 8.5* 8.3*  HCT 36.8 34.3* 26.9* 25.1*  MCV 101.4* 98.8 98.5 98.0  PLT 143* 143* 117* [id number]*   Basic Metabolic Panel: Recent Labs  Lab 12/03/18 1002 12/05/18 0257 12/06/18 0228  NA 139 137 137  K 4.9 4.0 4.4  CL 100 99 100  CO2 25 26 26   GLUCOSE 131* 116* 90  BUN 23 38* 40*  CREATININE 1.59* 2.34* 2.15*  CALCIUM 9.1 8.6* 8.5*   GFR: Estimated Creatinine Clearance: 38.6 mL/min (A) (by C-G formula based on SCr of 2.15  mg/dL (H)). Liver Function Tests: No results for input(s): AST, ALT, ALKPHOS, BILITOT, PROT, ALBUMIN in the last 168 hours. No results for input(s): LIPASE, AMYLASE in the last 168 hours. No results for input(s): AMMONIA in the last 168 hours. Coagulation Profile: Recent Labs  Lab 12/03/18 1000 12/05/18 0257 12/06/18 0228  INR 2.6* 1.7* 1.9*   Cardiac Enzymes: No results for input(s): CKTOTAL, CKMB, CKMBINDEX, TROPONINI in the last 168 hours. BNP (last 3 results) No results for input(s): PROBNP in the last 8760 hours. HbA1C: No results for input(s): HGBA1C in the last 72 hours. CBG: Recent Labs  Lab 12/05/18 0744  GLUCAP 133*   Lipid Profile: No results for input(s): CHOL, HDL, LDLCALC, TRIG, CHOLHDL, LDLDIRECT in the last 72 hours. Thyroid Function Tests: No results for input(s): TSH, T4TOTAL, FREET4, T3FREE, THYROIDAB in the last 72 hours. Anemia Panel: No results for input(s): VITAMINB12, FOLATE, FERRITIN, TIBC, IRON, RETICCTPCT in the last 72 hours. Sepsis Labs: No  results for input(s): PROCALCITON, LATICACIDVEN in the last 168 hours.  Recent Results (from the past 240 hour(s))  SARS Coronavirus 2 [facility] Treatment order, Performed in [facility] hospital lab) Nasopharyngeal Nasopharyngeal Swab     Status: None   Collection Time: 12/03/18 10:11 AM   Specimen: Nasopharyngeal Swab  Result Value Ref Range Status   SARS Coronavirus 2 NEGATIVE NEGATIVE Final    Comment: (NOTE) If result is NEGATIVE SARS-CoV-2 target nucleic acids are NOT DETECTED. The SARS-CoV-2 RNA is generally detectable in upper and lower  respiratory specimens during the acute phase of infection. The lowest  concentration of SARS-CoV-2 viral copies this assay can detect is 250  copies / mL. A negative result does not preclude SARS-CoV-2 infection  and should not be used as the sole basis for treatment or other  patient management decisions.  A negative result may occur with  improper specimen collection /  handling, submission of specimen other  than nasopharyngeal swab, presence of viral mutation(s) within the  areas targeted by this assay, and inadequate number of viral copies  (<250 copies / mL). A negative result must be combined with clinical  observations, patient history, and epidemiological information. If result is POSITIVE SARS-CoV-2 target nucleic acids are DETECTED. The SARS-CoV-2 RNA is generally detectable in upper and lower  respiratory specimens dur ing the acute phase of infection.  Positive  results are indicative of active infection with SARS-CoV-2.  Clinical  correlation with patient history and other diagnostic information is  necessary to determine patient infection status.  Positive results do  not rule out bacterial infection or co-infection with other viruses. If result is PRESUMPTIVE POSTIVE SARS-CoV-2 nucleic acids MAY BE PRESENT.   A presumptive positive result was obtained on the submitted specimen  and confirmed on repeat testing.  While 2019 novel coronavirus  (SARS-CoV-2) nucleic acids may be present in the submitted sample  additional confirmatory testing may be necessary for epidemiological  and / or clinical management purposes  to differentiate between  SARS-CoV-2 and other Sarbecovirus currently known to infect humans.  If clinically indicated additional testing with an alternate test  methodology [phone]) is advised. The SARS-CoV-2 RNA is generally  detectable in upper and lower respiratory sp ecimens during the acute  phase of infection. The expected result is Negative. Fact Sheet for Patients:  StrictlyIdeas.no Fact Sheet for Healthcare Providers: BankingDealers.co.za This test is not yet approved or cleared by the [place] FDA and has been authorized for detection and/or diagnosis of SARS-CoV-2 by FDA under an Emergency Use Authorization (EUA).  This EUA will remain in effect (meaning this  test can be used) for the duration of the COVID-19 declaration under Section 564(b)(1) of the Act, 21 U.S.C. section 360bbb-3(b)(1), unless the authorization is terminated or revoked sooner. Performed at [facility], [place] [address], [place], [place] [address]          Radiology Studies: No results found.      Scheduled Meds: . acetaminophen  1,000 mg Oral Q6H  . cholecalciferol  5,000 Units Oral Daily  . gabapentin  100 mg Oral TID  . [START ON 12/07/2018] influenza vaccine adjuvanted  0.5 mL Intramuscular Tomorrow-1000  . metoprolol succinate  100 mg Oral Daily  . Vitamin D (Ergocalciferol)  50,000 Units Oral Q7 days  . warfarin  7.5 mg Oral ONCE-1800  . Warfarin - Pharmacist Dosing Inpatient   Does not apply q1800   Continuous Infusions:    LOS: 3 days  Time spent: 35 mins.More than 50% of that time was spent in counseling and/or coordination of care.      Harold Hedge, DO [facility] Pager [phone]  If 7PM-7AM, please contact night-coverage [email or web address] Password [email or web address] 12/06/2018, 1:32 PM

## 2018-12-06 NOTE — Care Management Important Message (Signed)
Important Message  Patient Details  Name: Jean Stewart MRN: [id number] Date of Birth: [birth year]   Medicare Important Message Given:  Yes     Riah Kehoe [address] 12/06/2018, 4:15 PM

## 2018-12-06 NOTE — NC FL2 (Signed)
[place] LEVEL OF CARE SCREENING TOOL     IDENTIFICATION  Patient Name: Jean Stewart Birthdate: April 08, 1945 Sex: female Admission Date (Current Location): 12/03/2018  [facility] and [place] Number:  Herbalist and Address:  [facility] [place], [place] [address], [place], [place] [address]      Provider Number: [id number]  Attending Physician Name and Address:  Harold Hedge, MD  Relative Name and Phone Number:  Deloris Beveridge(Daughter)[phone]    Current Level of Care: SNF Recommended Level of Care: [place] Prior Approval Number:    Date Approved/Denied:   PASRR Number: [id number]  Discharge Plan: SNF    Current Diagnoses: Patient Active Problem List   Diagnosis Date Noted  . Open fracture dislocation of ankle 12/04/2018  . Type III open trimalleolar fracture of ankle 12/04/2018  . 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[place]) 12/04/2018  . Chronic anticoagulation 12/04/2018  . Anemia of chronic disease 12/04/2018  . Pressure injury of skin 05/12/2018  . Atrial fibrillation with RVR ([place]) 05/11/2018  . Obesity, Class III, BMI 40-49.9 (morbid obesity) ([place]) 05/11/2018  . Essential hypertension 05/11/2018  . Renal insufficiency 05/11/2018  . Permanent atrial fibrillation ([place]) 05/11/2018  . Acute respiratory failure with hypoxia and hypercapnia ([place]) 05/11/2018  . Elevated troponin 05/11/2018    Orientation RESPIRATION BLADDER Height & Weight     Self, Time, Situation, Place  Normal Continent Weight: (!) 183.3 kg Height:  5' 5.5" (166.4 cm)  BEHAVIORAL SYMPTOMS/MOOD NEUROLOGICAL BOWEL NUTRITION STATUS      Continent Diet(refer to d/c summary)  AMBULATORY STATUS COMMUNICATION OF NEEDS Skin   Extensive Assist Verbally Normal                       Personal Care Assistance Level of Assistance  Bathing, Feeding, Dressing Bathing Assistance: Limited assistance Feeding assistance:  Independent Dressing Assistance: Limited assistance     Functional Limitations Info  Sight, Hearing, Speech Sight Info: Adequate Hearing Info: Adequate Speech Info: Adequate    SPECIAL CARE FACTORS FREQUENCY  PT (By licensed PT), OT (By licensed OT)     PT Frequency: 5 x/ week OT Frequency: 5 x/ week            Contractures Contractures Info: Present    Additional Factors Info  Code Status, Allergies Code Status Info: Full code Allergies Info: No known drug allergies           Current Medications (12/06/2018):  This is the current hospital active medication list Current Facility-Administered Medications  Medication Dose Route Frequency Provider Last Rate Last Dose  . acetaminophen (TYLENOL) tablet 1,000 mg  1,000 mg Oral Q6H Smith, Rondell A, MD   1,000 mg at 12/06/18 0542  . cholecalciferol (VITAMIN D3) tablet 5,000 Units  5,000 Units Oral Daily Fuller Plan A, MD   5,000 Units at 12/05/18 [phone]  . docusate sodium (COLACE) capsule 200 mg  200 mg Oral Daily PRN Fuller Plan A, MD      . gabapentin (NEURONTIN) capsule 100 mg  100 mg Oral TID Fuller Plan A, MD   100 mg at 12/05/18 2107  . gabapentin (NEURONTIN) capsule 100 mg  100 mg Oral Daily PRN Fuller Plan A, MD      . HYDROmorphone (DILAUDID) injection 1 mg  1 mg Intravenous Q3H PRN Fuller Plan A, MD      . Derrill Memo ON 12/07/2018] influenza vaccine adjuvanted (FLUAD) injection 0.5 mL  0.5  mL Intramuscular Tomorrow-1000 Harold Hedge, MD      . methocarbamol (ROBAXIN) tablet 500 mg  500 mg Oral Q6H PRN Fuller Plan A, MD   500 mg at 12/03/18 2127  . metoprolol succinate (TOPROL-XL) 24 hr tablet 100 mg  100 mg Oral Daily Tamala Julian, Rondell A, MD   100 mg at 12/05/18 0844  . oxyCODONE (Oxy IR/ROXICODONE) immediate release tablet 10-15 mg  10-15 mg Oral Q4H PRN Smith, Rondell A, MD      . oxyCODONE (Oxy IR/ROXICODONE) immediate release tablet 5-10 mg  5-10 mg Oral Q4H PRN Fuller Plan A, MD   10 mg at 12/04/18  [id number]  . Vitamin D (Ergocalciferol) (DRISDOL) capsule 50,000 Units  50,000 Units Oral Q7 days Norval Morton, MD   50,000 Units at 12/04/18 0849  . warfarin (COUMADIN) tablet 7.5 mg  7.5 mg Oral ONCE-1800 Karren Cobble, [place]      . Warfarin - Pharmacist Dosing Inpatient   Does not apply q1800 Norval Morton, MD         Discharge Medications: Please see discharge summary for a list of discharge medications.  Relevant Imaging Results:  Relevant Lab Results:   Additional Information ss# [id number]  Sharin Mons, RN

## 2018-12-07 LAB — BASIC METABOLIC PANEL
Anion gap: 10 (ref 5–15)
BUN: 39 mg/dL — ABNORMAL HIGH (ref 8–23)
CO2: 25 mmol/L (ref 22–32)
Calcium: 8.6 mg/dL — ABNORMAL LOW (ref 8.9–10.3)
Chloride: 104 mmol/L (ref 98–111)
Creatinine, Ser: 1.82 mg/dL — ABNORMAL HIGH (ref 0.44–1.00)
GFR calc Af Amer: 31 mL/min — ABNORMAL LOW (ref >60)
GFR calc non Af Amer: 27 mL/min — ABNORMAL LOW (ref >60)
Glucose, Bld: 99 mg/dL (ref 70–99)
Potassium: 4.7 mmol/L (ref 3.5–5.1)
Sodium: 139 mmol/L (ref 135–145)

## 2018-12-07 LAB — CBC
HCT: 26.5 % — ABNORMAL LOW (ref 36.0–46.0)
Hemoglobin: 8.6 g/dL — ABNORMAL LOW (ref 12.0–15.0)
MCH: 32.2 pg (ref 26.0–34.0)
MCHC: 32.5 g/dL (ref 30.0–36.0)
MCV: 99.3 fL (ref 80.0–100.0)
Platelets: 130 10*3/uL — ABNORMAL LOW (ref 150–400)
RBC: 2.67 MIL/uL — ABNORMAL LOW (ref 3.87–5.11)
RDW: 13.8 % (ref 11.5–15.5)
WBC: 6.2 10*3/uL (ref 4.0–10.5)
nRBC: 0 % (ref 0.0–0.2)

## 2018-12-07 LAB — PROTIME-INR
INR: 2.2 — ABNORMAL HIGH (ref 0.8–1.2)
Prothrombin Time: 23.8 seconds — ABNORMAL HIGH (ref 11.4–15.2)

## 2018-12-07 MED ORDER — WARFARIN SODIUM 5 MG PO TABS
5.0000 mg | ORAL_TABLET | Freq: Every day | ORAL | Status: DC
  Administered 2018-12-07 – 2018-12-08 (×2): 5 mg via ORAL
  Filled 2018-12-07 (×2): qty 1

## 2018-12-07 NOTE — Progress Notes (Signed)
PROGRESS NOTE    [name]  [id number] DOB: [birth year] DOA: 12/03/2018 PCP: Patient, No Pcp Per   Brief Narrative:  Patient is a 36-year female with history of hypertension, CHF, permanent A. fib on Coumadin, COPD, on oxygen at 2 to 3 L/min at home, CKD stage III, morbid obesity who presented with left ankle pain.  Imagings done in the emergency department showed open fracture /dislocation of left ankle joint.  Orthopedics was consulted and she underwent irrigation and debridement of the left open ankle fracture, ORIF with external fixation placement.  She was also found to have severe vitamin D deficiency and started on vitamin D supplementation.  She will need SNF on discharge.  Assessment & Plan:   Principal Problem:   Open fracture dislocation of ankle Active Problems:   Essential hypertension   Permanent atrial fibrillation (HCC)   Type III open trimalleolar fracture of ankle   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HCC)   Chronic anticoagulation   Anemia of chronic disease   Open ankle fracture dislocation: Imagings showed Lateral dislocation of the talus out of the tibiotalar joint with a transverse fracture through the lateral malleolus and a large skin laceration over the medial malleolus.  Orthopedics following. She underwent irrigation and debridement of the left open ankle fracture, ORIF with external fixation placement on 12/03/18.  Continue  dressing.  Not weightbearing on the left leg.  Continue pain management.  She has been given few days of Rocephin post OR.  Waiting for PT/OT evaluation. SNF placement at discharge  AKI on CKD stage III/IV: Baseline creatinine ranged from 1.5-1.8, peaked at 2.3 improved with IV fluids.  monitor  Combined systolic/diastolic CHF: Compensated.  Last ejection fraction of 40 to 45% as per echocardiogram done in 3/20.  She is on Lasix at home 3 times a week.  Lasix on hold due to acute kidney injury.  Hold Lasix with  borderline low BPs  Permanent Atrial fibrillation:  Heart rate well controlled.  On anticoagulation with warfarin  Thrombocytopenia continue to monitor  Vitamin D deficiency.  Started on supplementation.  COPD: On home oxygen at 2 to 3 L/min.  Currently COPD stable.  Denies any shortness of breath or wheezing.  Continue supplemental oxygen as needed.  Normocytic anemia: Hemoglobin dropped from 11.4-8.5-> 8.3.  Likely Postop. Continue to monitor  Hypertension: Currently blood pressure stable.  Continue current medicines  Thrombocytopenia: Chronic.  Stable.  Continue to monitor  Morbid obesity: BMI of 66.2           DVT prophylaxis:Coumadin Code Status: Full Family Communication I called the patient's daughter but she did not answer. Disposition Plan: Patient selected [facility] for SNF placement, Insurance authorization in process. COVID test pending.    Consultants: Ortho  Procedures: I&D, ORIF, external fixator placement  Antimicrobials:  Anti-infectives (From admission, onward)   Start     Dose/Rate Route Frequency Ordered Stop   12/04/18 0600  ceFAZolin (ANCEF) 3 g in dextrose 5 % 50 mL IVPB     3 g 100 mL/hr over 30 Minutes Intravenous On call to O.R. 12/03/18 1419 12/03/18 1526   12/03/18 2100  cefTRIAXone (ROCEPHIN) 2 g in sodium chloride 0.9 % 100 mL IVPB     2 g 200 mL/hr over 30 Minutes Intravenous Every 24 hours 12/03/18 1858 12/06/18 0050   12/03/18 1631  tobramycin (NEBCIN) powder  Status:  Discontinued       As needed 12/03/18 1631 12/03/18 1743   12/03/18 1631  vancomycin (VANCOCIN) powder  Status:  Discontinued       As needed 12/03/18 1631 12/03/18 1743      Subjective:  Patient seen and examined at bedside in no acute distress. Denies any complaints. Denies any lightheadedness, dizziness, orthostatsis, chest pain, palpitations, nausea, vomiting.   Objective: Vitals:   12/06/18 2119 12/07/18 0523 12/07/18 0647 12/07/18 1048  BP: (!) 95/57 90/62   93/62  Pulse: 91 78  67  Resp: 18 18    Temp: 97.8 F (36.6 C) 98.4 F (36.9 C)    TempSrc: Oral Oral    SpO2: 100% 100%  100%  Weight:   (!) 150.5 kg   Height:        Intake/Output Summary (Last 24 hours) at 12/07/2018 1407 Last data filed at 12/07/2018 1017 Gross per 24 hour  Intake 120 ml  Output 1000 ml  Net -880 ml   Filed Weights   12/03/18 0950 12/07/18 0647  Weight: (!) 183.3 kg (!) 150.5 kg    Examination:  Physical Exam Vitals signs and nursing note reviewed.  Constitutional:      Appearance: Normal appearance.  HENT:     Head: Normocephalic and atraumatic.     Mouth/Throat:     Mouth: Mucous membranes are moist.  Eyes:     Extraocular Movements: Extraocular movements intact.  Neck:     Musculoskeletal: Normal range of motion. No neck rigidity.  Cardiovascular:     Rate and Rhythm: Normal rate and regular rhythm.  Pulmonary:     Effort: Pulmonary effort is normal.     Breath sounds: Normal breath sounds.  Abdominal:     General: Abdomen is flat.     Palpations: Abdomen is soft.  Musculoskeletal:     Right lower leg: No edema.     Comments: Ortho hardware in place on Left ankle  Neurological:     Mental Status: She is alert. Mental status is at baseline.  Psychiatric:        Mood and Affect: Mood normal.        Behavior: Behavior normal.       Data Reviewed: I have personally reviewed following labs and imaging studies  CBC: Recent Labs  Lab 12/03/18 1002 12/04/18 0204 12/05/18 0257 12/06/18 0228  WBC 5.5 8.9 9.5 7.8  NEUTROABS 3.1  --  6.5 4.1  HGB 11.4* 11.0* 8.5* 8.3*  HCT 36.8 34.3* 26.9* 25.1*  MCV 101.4* 98.8 98.5 98.0  PLT 143* 143* 117* [id number]*   Basic Metabolic Panel: Recent Labs  Lab 12/03/18 1002 12/05/18 0257 12/06/18 0228  NA 139 137 137  K 4.9 4.0 4.4  CL 100 99 100  CO2 25 26 26   GLUCOSE 131* 116* 90  BUN 23 38* 40*  CREATININE 1.59* 2.34* 2.15*  CALCIUM 9.1 8.6* 8.5*   GFR: Estimated Creatinine Clearance:  33.9 mL/min (A) (by C-G formula based on SCr of 2.15 mg/dL (H)). Liver Function Tests: No results for input(s): AST, ALT, ALKPHOS, BILITOT, PROT, ALBUMIN in the last 168 hours. No results for input(s): LIPASE, AMYLASE in the last 168 hours. No results for input(s): AMMONIA in the last 168 hours. Coagulation Profile: Recent Labs  Lab 12/03/18 1000 12/05/18 0257 12/06/18 0228 12/07/18 0220  INR 2.6* 1.7* 1.9* 2.2*   Cardiac Enzymes: No results for input(s): CKTOTAL, CKMB, CKMBINDEX, TROPONINI in the last 168 hours. BNP (last 3 results) No results for input(s): PROBNP in the last 8760 hours. HbA1C: No results for input(s): HGBA1C in  the last 72 hours. CBG: Recent Labs  Lab 12/05/18 0744  GLUCAP 133*   Lipid Profile: No results for input(s): CHOL, HDL, LDLCALC, TRIG, CHOLHDL, LDLDIRECT in the last 72 hours. Thyroid Function Tests: No results for input(s): TSH, T4TOTAL, FREET4, T3FREE, THYROIDAB in the last 72 hours. Anemia Panel: No results for input(s): VITAMINB12, FOLATE, FERRITIN, TIBC, IRON, RETICCTPCT in the last 72 hours. Sepsis Labs: No results for input(s): PROCALCITON, LATICACIDVEN in the last 168 hours.  Recent Results (from the past 240 hour(s))  SARS Coronavirus 2 [facility] order, Performed in [facility] hospital lab) Nasopharyngeal Nasopharyngeal Swab     Status: None   Collection Time: 12/03/18 10:11 AM   Specimen: Nasopharyngeal Swab  Result Value Ref Range Status   SARS Coronavirus 2 NEGATIVE NEGATIVE Final    Comment: (NOTE) If result is NEGATIVE SARS-CoV-2 target nucleic acids are NOT DETECTED. The SARS-CoV-2 RNA is generally detectable in upper and lower  respiratory specimens during the acute phase of infection. The lowest  concentration of SARS-CoV-2 viral copies this assay can detect is 250  copies / mL. A negative result does not preclude SARS-CoV-2 infection  and should not be used as the sole basis for treatment or other  patient management  decisions.  A negative result may occur with  improper specimen collection / handling, submission of specimen other  than nasopharyngeal swab, presence of viral mutation(s) within the  areas targeted by this assay, and inadequate number of viral copies  (<250 copies / mL). A negative result must be combined with clinical  observations, patient history, and epidemiological information. If result is POSITIVE SARS-CoV-2 target nucleic acids are DETECTED. The SARS-CoV-2 RNA is generally detectable in upper and lower  respiratory specimens dur ing the acute phase of infection.  Positive  results are indicative of active infection with SARS-CoV-2.  Clinical  correlation with patient history and other diagnostic information is  necessary to determine patient infection status.  Positive results do  not rule out bacterial infection or co-infection with other viruses. If result is PRESUMPTIVE POSTIVE SARS-CoV-2 nucleic acids MAY BE PRESENT.   A presumptive positive result was obtained on the submitted specimen  and confirmed on repeat testing.  While 2019 novel coronavirus  (SARS-CoV-2) nucleic acids may be present in the submitted sample  additional confirmatory testing may be necessary for epidemiological  and / or clinical management purposes  to differentiate between  SARS-CoV-2 and other Sarbecovirus currently known to infect humans.  If clinically indicated additional testing with an alternate test  methodology [phone]) is advised. The SARS-CoV-2 RNA is generally  detectable in upper and lower respiratory sp ecimens during the acute  phase of infection. The expected result is Negative. Fact Sheet for Patients:  StrictlyIdeas.no Fact Sheet for Healthcare Providers: BankingDealers.co.za This test is not yet approved or cleared by the [place] FDA and has been authorized for detection and/or diagnosis of SARS-CoV-2 by FDA under an  Emergency Use Authorization (EUA).  This EUA will remain in effect (meaning this test can be used) for the duration of the COVID-19 declaration under Section 564(b)(1) of the Act, 21 U.S.C. section 360bbb-3(b)(1), unless the authorization is terminated or revoked sooner. Performed at [facility], [place] [address], [place], [place] [address]          Radiology Studies: No results found.      Scheduled Meds: . acetaminophen  1,000 mg Oral Q6H  . cholecalciferol  5,000 Units Oral Daily  . gabapentin  100 mg Oral TID  . influenza vaccine adjuvanted  0.5 mL Intramuscular Tomorrow-1000  . metoprolol succinate  100 mg Oral Daily  . Vitamin D (Ergocalciferol)  50,000 Units Oral Q7 days  . warfarin  5 mg Oral q1800  . Warfarin - Pharmacist Dosing Inpatient   Does not apply q1800   Continuous Infusions:    LOS: 4 days    Time spent: 30 mins.More than 50% of that time was spent in counseling and/or coordination of care.      Harold Hedge, DO [facility] Pager [phone]  If 7PM-7AM, please contact night-coverage [email or web address] Password [email or web address] 12/07/2018, 2:07 PM

## 2018-12-07 NOTE — Progress Notes (Signed)
ANTICOAGULATION CONSULT NOTE - Follow Up Consult  Pharmacy Consult for Coumadin Indication: atrial fibrillation  No Known Allergies  Patient Measurements: Height: 5' 5.5" (166.4 cm) Weight: (!) 331 lb 14.4 oz (150.5 kg) IBW/kg (Calculated) : 58.15  Vital Signs: Temp: 98.4 F (36.9 C) (10/02 0523) Temp Source: Oral (10/02 0523) BP: 90/62 (10/02 0523) Pulse Rate: 78 (10/02 0523)  Labs: Recent Labs    12/05/18 0257 12/06/18 0228 12/07/18 0220  HGB 8.5* 8.3*  --   HCT 26.9* 25.1*  --   PLT 117* 116*  --   LABPROT 19.8* 21.1* 23.8*  INR 1.7* 1.9* 2.2*  CREATININE 2.34* 2.15*  --     Estimated Creatinine Clearance: 33.9 mL/min (A) (by C-G formula based on SCr of 2.15 mg/dL (H)).   Assessment:  Anticoag: On Warfarin PTA for Afib, INR 2.2. Hgb (baseline) 11.4>>8.3 today. Plts down to 116 - 9/28: Vit K 5mg  IV - PTA: INR 2.6 on admission on Coumadin 5 mg daily  Goal of Therapy:  INR 2-3 Monitor platelets by anticoagulation protocol: Yes   Plan:  Resume home Coumadin 5mg  po daily DAily INR   Tashianna Broome S. Alford Highland, PharmD, BCPS Clinical Staff Pharmacist Eilene Ghazi Stillinger 12/07/2018,9:18 AM

## 2018-12-07 NOTE — Progress Notes (Signed)
Physical Therapy Treatment Patient Details Name: Jean Stewart MRN: [id number] DOB: 03/09/1945 Today's Date: 12/07/2018    History of Present Illness Patient is a 73 year old female admitted after fall with L ankle fracture. PMH includes: HTN, morbid obesity, Afib, COPD.    PT Comments    Pt was seen for bed to chair transfers, and noted her difficulty with core strength to control sitting balance.  Uses supine scooting alternating with sitting and shifting backward, and was able with bed pad to finish transfer to chair with legs supported on the bed.  Talked with nursing about the bed pad being under her in chair to allow a smooth transfer back, and they voiced understanding of the technique.  Follow up with her to work on lateral transfers next, and will continue to recommend SNF for rehab.   Follow Up Recommendations  SNF     Equipment Recommendations  None recommended by PT    Recommendations for Other Services       Precautions / Restrictions Precautions Precautions: Fall Precaution Comments: external fixator on L LE Restrictions Weight Bearing Restrictions: Yes LLE Weight Bearing: Non weight bearing    Mobility  Bed Mobility Overal bed mobility: Needs Assistance Bed Mobility: Supine to Sit;Rolling Rolling: Min assist   Supine to sit: Mod assist Sit to supine: Min assist;Mod assist   General bed mobility comments: pt was assisted with bed pad to scoot on bed  Transfers Overall transfer level: Needs assistance Equipment used: 1 person hand held assist(bed pads) Transfers: Comptroller transfers: Mod assist;From elevated surface   General transfer comment: tired by end of transfer to recliner, lowered head of chair to get pt to scoot as she prefers on her back  Ambulation/Gait                 Stairs             Wheelchair Mobility    Modified Rankin (Stroke Patients Only)       Balance    Sitting-balance support: Feet supported Sitting balance-Leahy Scale: Fair Sitting balance - Comments: less than fair as she fatigues                                    Cognition Arousal/Alertness: Awake/alert Behavior During Therapy: WFL for tasks assessed/performed Overall Cognitive Status: Within Functional Limits for tasks assessed                                        Exercises      General Comments        Pertinent Vitals/Pain Pain Assessment: Faces Faces Pain Scale: Hurts little more Pain Location: LLE with movement to SLR Pain Descriptors / Indicators: Operative site guarding;Grimacing Pain Intervention(s): Limited activity within patient's tolerance;Premedicated before session;Repositioned    Home Living                      Prior Function            PT Goals (current goals can now be found in the care plan section) Acute Rehab PT Goals Patient Stated Goal: to get stronger Progress towards PT goals: Progressing toward goals    Frequency    Min 3X/week      PT Plan Current  plan remains appropriate    Co-evaluation              AM-PAC PT "6 Clicks" Mobility   Outcome Measure  Help needed turning from your back to your side while in a flat bed without using bedrails?: A Little Help needed moving from lying on your back to sitting on the side of a flat bed without using bedrails?: A Lot Help needed moving to and from a bed to a chair (including a wheelchair)?: A Lot Help needed standing up from a chair using your arms (e.g., wheelchair or bedside chair)?: Total Help needed to walk in hospital room?: Total Help needed climbing 3-5 steps with a railing? : Total 6 Click Score: 10    End of Session   Activity Tolerance: Patient tolerated treatment well;Patient limited by fatigue Patient left: with call bell/phone within reach;in chair;Other (comment)(with LE's on the bed) Nurse Communication: Mobility  status;Other (comment)(technique for transfer back to bed) PT Visit Diagnosis: Muscle weakness (generalized) (M62.81);Difficulty in walking, not elsewhere classified (R26.2);Other abnormalities of gait and mobility (R26.89);History of falling (Z91.81)     Time: [id number] PT Time Calculation (min) (ACUTE ONLY): 28 min  Charges:  $Therapeutic Activity: 23-37 mins                    Ramond Dial 12/07/2018, 4:33 PM   Mee Hives, PT MS Acute Rehab Dept. Number: [place] and [place]

## 2018-12-07 NOTE — Progress Notes (Signed)
Pt faxed out for SNF bed on yesterday, awaiting availability ... NCM will continue to monitor. Whitman Hero RN,BSN,CM [phone]

## 2018-12-07 NOTE — Plan of Care (Signed)
  Problem: Coping: Goal: Ability to identify and develop effective coping behavior will improve Outcome: Progressing   Problem: Self-Concept: Goal: Level of anxiety will decrease Outcome: Progressing   Problem: Health Behavior/Discharge Planning: Goal: Ability to manage health-related needs will improve Outcome: Progressing   Problem: Clinical Measurements: Goal: Respiratory complications will improve Outcome: Progressing Goal: Cardiovascular complication will be avoided Outcome: Progressing

## 2018-12-07 NOTE — Progress Notes (Signed)
Pt selected [facility] for SNF placement. Insurance authorization in process. Updated COVID needed, nurse /MD aware. NCM will continue to monitor for TOC needs. Whitman Hero RN,BSN,CM [phone]

## 2018-12-08 LAB — PROTIME-INR
INR: 2.5 — ABNORMAL HIGH (ref 0.8–1.2)
Prothrombin Time: 26.4 s — ABNORMAL HIGH (ref 11.4–15.2)

## 2018-12-08 LAB — NOVEL CORONAVIRUS, NAA (HOSP ORDER, SEND-OUT TO REF LAB; TAT 18-24 HRS): SARS-CoV-2, NAA: NOT DETECTED

## 2018-12-08 NOTE — Progress Notes (Signed)
ANTICOAGULATION CONSULT NOTE - Follow Up Consult  Pharmacy Consult for Coumadin Indication: atrial fibrillation  No Known Allergies  Patient Measurements: Height: 5' 5.5" (166.4 cm) Weight: (!) 333 lb 6.4 oz (151.2 kg) IBW/kg (Calculated) : 58.15  Vital Signs: Temp: 97.3 F (36.3 C) (10/03 0525) Temp Source: Oral (10/03 0525) BP: 112/70 (10/03 0525) Pulse Rate: 76 (10/03 0525)  Labs: Recent Labs    12/06/18 0228 12/07/18 0220 12/07/18 1445 12/08/18 0326  HGB 8.3*  --  8.6*  --   HCT 25.1*  --  26.5*  --   PLT 116*  --  130*  --   LABPROT 21.1* 23.8*  --  26.4*  INR 1.9* 2.2*  --  2.5*  CREATININE 2.15*  --  1.82*  --     Estimated Creatinine Clearance: 40.2 mL/min (A) (by C-G formula based on SCr of 1.82 mg/dL (H)).   Assessment: On Warfarin PTA for Afib. INR on admission was 2.6 on Warfarin 5 mg daily. Today INR 2.5 (therapuetic). Hgb 11.4>>8.6 today. Plts 130 (low but improving). Creatine 1.82 (improved). On 9/28, received Vit K 5mg  IV for debridement of open ankle fracture; warfarin was restarted on 9/29.   Goal of Therapy:  INR 2-3 Monitor platelets by anticoagulation protocol: Yes   Plan:  Continue home Coumadin 5mg  po daily Daily INR  Acey Lav, PharmD  PGY1 Acute Care Pharmacy Resident [phone] 12/08/2018,8:44 AM

## 2018-12-08 NOTE — Progress Notes (Signed)
PROGRESS NOTE    [name]  WR:[id number] DOB: [birth year] DOA: 12/03/2018 PCP: Patient, No Pcp Per   Brief Narrative:  Patient is a 58-year female with history of hypertension, CHF, permanent A. fib on Coumadin, COPD, on oxygen at 2 to 3 L/min at home, CKD stage III, morbid obesity who presented with left ankle pain.  Imagings done in the emergency department showed open fracture /dislocation of left ankle joint.  Orthopedics was consulted and she underwent irrigation and debridement of the left open ankle fracture, ORIF with external fixation placement.  She was also found to have severe vitamin D deficiency and started on vitamin D supplementation.  She will need SNF on discharge.  Assessment & Plan:   Principal Problem:   Open fracture dislocation of ankle Active Problems:   Essential hypertension   Permanent atrial fibrillation (HCC)   Type III open trimalleolar fracture of ankle   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HCC)   Chronic anticoagulation   Anemia of chronic disease   Open ankle fracture dislocation: Imagings showed Lateral dislocation of the talus out of the tibiotalar joint with a transverse fracture through the lateral malleolus and a large skin laceration over the medial malleolus.  Orthopedics following. She underwent irrigation and debridement of the left open ankle fracture, ORIF with external fixation placement on 12/03/18.  Continue  dressing.  Not weightbearing on the left leg.  Continue pain management.  She has been given few days of Rocephin post OR. SNF placement at discharge, pending COVID test.  AKI on CKD stage III/IV: Improving Baseline creatinine ranged from 1.5-1.8, peaked at 2.3 improved with IV fluids.  monitor  Combined systolic/diastolic CHF: Compensated.  Last ejection fraction of 40 to 45% as per echocardiogram done in 3/20.  She is on Lasix at home 3 times a week.  Lasix on hold due to acute kidney injury.  Hold Lasix with  borderline low BPs.  Will likely restart Lasix tomorrow pending BMP  Permanent Atrial fibrillation:  Heart rate well controlled.  On anticoagulation with warfarin.  INR 2.5  Thrombocytopenia continue to monitor  Vitamin D deficiency.  Started on supplementation.  COPD: On home oxygen at 2 to 3 L/min, currently on 2 L nasal cannula.  Currently COPD stable.  Denies any shortness of breath or wheezing. can titrate oxygen down as tolerated, goal SPO2 greater than 90%  Normocytic anemia: Hemoglobin dropped from 11.4-8.5-> 8.3.  Likely Postop. Continue to monitor  Hypertension: Currently blood pressure stable.  Continue current medicines  Thrombocytopenia: Chronic.  Stable.  Continue to monitor  Morbid obesity: BMI of 66.2           DVT prophylaxis:Coumadin Code Status: Full Family Communication I called the patient's daughter but she did not answer. Disposition Plan: Patient selected [facility] for SNF placement, Insurance authorization in process. COVID test pending.    Consultants: Ortho  Procedures: I&D, ORIF, external fixator placement  Antimicrobials:  Anti-infectives (From admission, onward)   Start     Dose/Rate Route Frequency Ordered Stop   12/04/18 0600  ceFAZolin (ANCEF) 3 g in dextrose 5 % 50 mL IVPB     3 g 100 mL/hr over 30 Minutes Intravenous On call to O.R. 12/03/18 1419 12/03/18 1526   12/03/18 2100  cefTRIAXone (ROCEPHIN) 2 g in sodium chloride 0.9 % 100 mL IVPB     2 g 200 mL/hr over 30 Minutes Intravenous Every 24 hours 12/03/18 1858 12/06/18 0050   12/03/18 1631  tobramycin (NEBCIN) powder  Status:  Discontinued       As needed 12/03/18 1631 12/03/18 1743   12/03/18 1631  vancomycin (VANCOCIN) powder  Status:  Discontinued       As needed 12/03/18 1631 12/03/18 1743      Subjective:  Patient seen lying comfortably in bed with no acute complaints.  She is in good spirits.  Admits to having regular bowel movements and no issues with urination.  Denies any  musculoskeletal complaints.  Otherwise negative  Objective: Vitals:   12/08/18 0029 12/08/18 0525 12/08/18 0727 12/08/18 1031  BP: (!) 100/53 112/70  110/67  Pulse:  76  70  Resp:  18    Temp:  (!) 97.3 F (36.3 C)    TempSrc:  Oral    SpO2:  100%    Weight:   (!) 151.2 kg   Height:        Intake/Output Summary (Last 24 hours) at 12/08/2018 1457 Last data filed at 12/08/2018 1136 Gross per 24 hour  Intake 360 ml  Output 2100 ml  Net -1740 ml   Filed Weights   12/03/18 0950 12/07/18 0647 12/08/18 0727  Weight: (!) 183.3 kg (!) 150.5 kg (!) 151.2 kg    Examination:  Physical Exam Vitals signs and nursing note reviewed.  Constitutional:      Appearance: Normal appearance.  HENT:     Head: Normocephalic and atraumatic.     Mouth/Throat:     Mouth: Mucous membranes are moist.  Eyes:     Extraocular Movements: Extraocular movements intact.  Neck:     Musculoskeletal: Normal range of motion. No neck rigidity.  Cardiovascular:     Rate and Rhythm: Normal rate and regular rhythm.  Pulmonary:     Effort: Pulmonary effort is normal.     Breath sounds: Normal breath sounds.  Abdominal:     General: Abdomen is flat.     Palpations: Abdomen is soft.  Musculoskeletal:        General: No swelling.     Comments: Ortho hardware in place on Left ankle  Neurological:     Mental Status: She is alert. Mental status is at baseline.  Psychiatric:        Mood and Affect: Mood normal.        Behavior: Behavior normal.       Data Reviewed: I have personally reviewed following labs and imaging studies  CBC: Recent Labs  Lab 12/03/18 1002 12/04/18 0204 12/05/18 0257 12/06/18 0228 12/07/18 1445  WBC 5.5 8.9 9.5 7.8 6.2  NEUTROABS 3.1  --  6.5 4.1  --   HGB 11.4* 11.0* 8.5* 8.3* 8.6*  HCT 36.8 34.3* 26.9* 25.1* 26.5*  MCV 101.4* 98.8 98.5 98.0 99.3  PLT 143* 143* 117* 116* [id number]*   Basic Metabolic Panel: Recent Labs  Lab 12/03/18 1002 12/05/18 0257 12/06/18 0228  12/07/18 1445  NA 139 137 137 139  K 4.9 4.0 4.4 4.7  CL 100 99 100 104  CO2 25 26 26 25   GLUCOSE 131* 116* 90 99  BUN 23 38* 40* 39*  CREATININE 1.59* 2.34* 2.15* 1.82*  CALCIUM 9.1 8.6* 8.5* 8.6*   GFR: Estimated Creatinine Clearance: 40.2 mL/min (A) (by C-G formula based on SCr of 1.82 mg/dL (H)). Liver Function Tests: No results for input(s): AST, ALT, ALKPHOS, BILITOT, PROT, ALBUMIN in the last 168 hours. No results for input(s): LIPASE, AMYLASE in the last 168 hours. No results for input(s): AMMONIA in the last 168 hours. Coagulation Profile:  Recent Labs  Lab 12/03/18 1000 12/05/18 0257 12/06/18 0228 12/07/18 0220 12/08/18 0326  INR 2.6* 1.7* 1.9* 2.2* 2.5*   Cardiac Enzymes: No results for input(s): CKTOTAL, CKMB, CKMBINDEX, TROPONINI in the last 168 hours. BNP (last 3 results) No results for input(s): PROBNP in the last 8760 hours. HbA1C: No results for input(s): HGBA1C in the last 72 hours. CBG: Recent Labs  Lab 12/05/18 0744  GLUCAP 133*   Lipid Profile: No results for input(s): CHOL, HDL, LDLCALC, TRIG, CHOLHDL, LDLDIRECT in the last 72 hours. Thyroid Function Tests: No results for input(s): TSH, T4TOTAL, FREET4, T3FREE, THYROIDAB in the last 72 hours. Anemia Panel: No results for input(s): VITAMINB12, FOLATE, FERRITIN, TIBC, IRON, RETICCTPCT in the last 72 hours. Sepsis Labs: No results for input(s): PROCALCITON, LATICACIDVEN in the last 168 hours.  Recent Results (from the past 240 hour(s))  SARS Coronavirus 2 [facility] order, Performed in [facility] hospital lab) Nasopharyngeal Nasopharyngeal Swab     Status: None   Collection Time: 12/03/18 10:11 AM   Specimen: Nasopharyngeal Swab  Result Value Ref Range Status   SARS Coronavirus 2 NEGATIVE NEGATIVE Final    Comment: (NOTE) If result is NEGATIVE SARS-CoV-2 target nucleic acids are NOT DETECTED. The SARS-CoV-2 RNA is generally detectable in upper and lower  respiratory specimens during the  acute phase of infection. The lowest  concentration of SARS-CoV-2 viral copies this assay can detect is 250  copies / mL. A negative result does not preclude SARS-CoV-2 infection  and should not be used as the sole basis for treatment or other  patient management decisions.  A negative result may occur with  improper specimen collection / handling, submission of specimen other  than nasopharyngeal swab, presence of viral mutation(s) within the  areas targeted by this assay, and inadequate number of viral copies  (<250 copies / mL). A negative result must be combined with clinical  observations, patient history, and epidemiological information. If result is POSITIVE SARS-CoV-2 target nucleic acids are DETECTED. The SARS-CoV-2 RNA is generally detectable in upper and lower  respiratory specimens dur ing the acute phase of infection.  Positive  results are indicative of active infection with SARS-CoV-2.  Clinical  correlation with patient history and other diagnostic information is  necessary to determine patient infection status.  Positive results do  not rule out bacterial infection or co-infection with other viruses. If result is PRESUMPTIVE POSTIVE SARS-CoV-2 nucleic acids MAY BE PRESENT.   A presumptive positive result was obtained on the submitted specimen  and confirmed on repeat testing.  While 2019 novel coronavirus  (SARS-CoV-2) nucleic acids may be present in the submitted sample  additional confirmatory testing may be necessary for epidemiological  and / or clinical management purposes  to differentiate between  SARS-CoV-2 and other Sarbecovirus currently known to infect humans.  If clinically indicated additional testing with an alternate test  methodology [phone]) is advised. The SARS-CoV-2 RNA is generally  detectable in upper and lower respiratory sp ecimens during the acute  phase of infection. The expected result is Negative. Fact Sheet for Patients:   StrictlyIdeas.no Fact Sheet for Healthcare Providers: BankingDealers.co.za This test is not yet approved or cleared by the [place] FDA and has been authorized for detection and/or diagnosis of SARS-CoV-2 by FDA under an Emergency Use Authorization (EUA).  This EUA will remain in effect (meaning this test can be used) for the duration of the COVID-19 declaration under Section 564(b)(1) of the Act, 21 U.S.C. section 360bbb-3(b)(1), unless the  authorization is terminated or revoked sooner. Performed at [facility], [place] [address], [place], [place] [address]          Radiology Studies: No results found.      Scheduled Meds: . acetaminophen  1,000 mg Oral Q6H  . cholecalciferol  5,000 Units Oral Daily  . gabapentin  100 mg Oral TID  . influenza vaccine adjuvanted  0.5 mL Intramuscular Tomorrow-1000  . metoprolol succinate  100 mg Oral Daily  . Vitamin D (Ergocalciferol)  50,000 Units Oral Q7 days  . warfarin  5 mg Oral q1800  . Warfarin - Pharmacist Dosing Inpatient   Does not apply q1800   Continuous Infusions:    LOS: 5 days    Time spent: 30 mins.More than 50% of that time was spent in counseling and/or coordination of care.      Harold Hedge, DO [facility] Pager [phone]  If 7PM-7AM, please contact night-coverage [email or web address] Password [email or web address] 12/08/2018, 2:57 PM

## 2018-12-08 NOTE — Progress Notes (Signed)
Patient difficult stick per IV team and midline was suggested, Patient is not receiving anything IV. Spoke to MD and ok with no IV access at this time

## 2018-12-09 LAB — BASIC METABOLIC PANEL
Anion gap: 8 (ref 5–15)
BUN: 31 mg/dL — ABNORMAL HIGH (ref 8–23)
CO2: 26 mmol/L (ref 22–32)
Calcium: 8.7 mg/dL — ABNORMAL LOW (ref 8.9–10.3)
Chloride: 106 mmol/L (ref 98–111)
Creatinine, Ser: 1.44 mg/dL — ABNORMAL HIGH (ref 0.44–1.00)
GFR calc Af Amer: 41 mL/min — ABNORMAL LOW (ref >60)
GFR calc non Af Amer: 35 mL/min — ABNORMAL LOW (ref >60)
Glucose, Bld: 104 mg/dL — ABNORMAL HIGH (ref 70–99)
Potassium: 4.3 mmol/L (ref 3.5–5.1)
Sodium: 140 mmol/L (ref 135–145)

## 2018-12-09 LAB — PROTIME-INR
INR: 2.8 — ABNORMAL HIGH (ref 0.8–1.2)
Prothrombin Time: 29.3 seconds — ABNORMAL HIGH (ref 11.4–15.2)

## 2018-12-09 MED ORDER — WARFARIN SODIUM 2.5 MG PO TABS
2.5000 mg | ORAL_TABLET | Freq: Once | ORAL | Status: AC
  Administered 2018-12-09: 18:00:00 2.5 mg via ORAL
  Filled 2018-12-09: qty 1

## 2018-12-09 NOTE — Progress Notes (Signed)
ANTICOAGULATION CONSULT NOTE - Follow Up Consult  Pharmacy Consult for Coumadin Indication: atrial fibrillation  No Known Allergies  Patient Measurements: Height: 5' 5.5" (166.4 cm) Weight: (!) 332 lb 6.4 oz (150.8 kg) IBW/kg (Calculated) : 58.15  Vital Signs: Temp: 98 F (36.7 C) (10/04 0437) Temp Source: Oral (10/04 0437) BP: 94/51 (10/04 0437) Pulse Rate: 79 (10/04 0437)  Labs: Recent Labs    12/07/18 0220 12/07/18 1445 12/08/18 0326 12/09/18 0221  HGB  --  8.6*  --   --   HCT  --  26.5*  --   --   PLT  --  130*  --   --   LABPROT 23.8*  --  26.4* 29.3*  INR 2.2*  --  2.5* 2.8*  CREATININE  --  1.82*  --   --     Estimated Creatinine Clearance: 40.1 mL/min (A) (by C-G formula based on SCr of 1.82 mg/dL (H)).   Assessment: On Warfarin PTA for Afib. INR on admission was 2.6 on Warfarin 5 mg daily. Today INR 2.8 (therapuetic, on higher end of therapeutic). INR has steadily increased 2.2>2.5>2.8  Hgb 11.4>>8.6 today. Plts 130 (low but improving). Creatine 1.82 (improved). On 9/28, received Vit K 5mg  IV for debridement of open ankle fracture; warfarin was restarted on 9/29.   Goal of Therapy:  INR 2-3 Monitor platelets by anticoagulation protocol: Yes   Plan:  Give Coumadin 2.5mg  po x1 Daily INR, monitor s/sx of bleeding   Acey Lav, PharmD  PGY1 Acute Care Pharmacy Resident [phone] 12/09/2018,9:00 AM

## 2018-12-09 NOTE — Progress Notes (Signed)
PROGRESS NOTE    [name]  [id number] DOB: 06/22/1945 DOA: 12/03/2018 PCP: Jean Stewart, No Pcp Per   Brief Narrative:  Jean Stewart is a 87-year female with history of hypertension, CHF, permanent A. fib on Coumadin, COPD, on oxygen at 2 to 3 L/min at home, CKD stage III, morbid obesity who presented with left ankle pain.  Imagings done in the emergency department showed open fracture /dislocation of left ankle joint.  Orthopedics was consulted and she underwent irrigation and debridement of the left open ankle fracture, ORIF with external fixation placement.  She was also found to have severe vitamin D deficiency and started on vitamin D supplementation.  She will need SNF on discharge.  Assessment & Plan:   Principal Problem:   Open fracture dislocation of ankle Active Problems:   Essential hypertension   Permanent atrial fibrillation (HCC)   Type III open trimalleolar fracture of ankle   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HCC)   Chronic anticoagulation   Anemia of chronic disease   Open ankle fracture dislocation: Imagings showed Lateral dislocation of the talus out of the tibiotalar joint with a transverse fracture through the lateral malleolus and a large skin laceration over the medial malleolus.  Orthopedics following. She underwent irrigation and debridement of the left open ankle fracture, ORIF with external fixation placement on 12/03/18.  Continue  dressing.  Not weightbearing on the left leg.  Continue pain management.  She has been given few days of Rocephin post OR. SNF placement at discharge. Covid Negative x 2.  AKI on CKD stage III/IV: Improving Baseline creatinine ranged from 1.5-1.8, peaked at 2.3 improved with IV fluids.  monitor  Combined systolic/diastolic CHF: Compensated.  Last ejection fraction of 40 to 45% as per echocardiogram done in 3/20.  She is on Lasix at home 3 times a week.  Lasix on hold due to acute kidney injury.  Hold Lasix with  borderline low BPs.  Will likely restart Lasix tomorrow pending BMP  Permanent Atrial fibrillation:  Heart rate well controlled.  On anticoagulation with warfarin.  INR 2.5  Thrombocytopenia continue to monitor  Vitamin D deficiency.  Started on supplementation.  COPD: On home oxygen at 2 to 3 L/min, currently on 2 L nasal cannula.  Currently COPD stable.  Denies any shortness of breath or wheezing. can titrate oxygen down as tolerated, goal SPO2 greater than 90%  Normocytic anemia: likely post op. Follow up tomorrow  Intermittent asymptomatic Hypertension: Currently blood pressure stable.  Continue current medicines  Thrombocytopenia: Chronic.  Stable.  Continue to monitor  Morbid obesity: BMI of 66.2           DVT prophylaxis:Coumadin Code Status: Full Disposition Plan: Jean Stewart selected [facility] for SNF placement, Insurance authorization in process. COVID test negative x 2   Consultants: Ortho  Procedures: I&D, ORIF, external fixator placement  Antimicrobials:  Anti-infectives (From admission, onward)   Start     Dose/Rate Route Frequency Ordered Stop   12/04/18 0600  ceFAZolin (ANCEF) 3 g in dextrose 5 % 50 mL IVPB     3 g 100 mL/hr over 30 Minutes Intravenous On call to O.R. 12/03/18 1419 12/03/18 1526   12/03/18 2100  cefTRIAXone (ROCEPHIN) 2 g in sodium chloride 0.9 % 100 mL IVPB     2 g 200 mL/hr over 30 Minutes Intravenous Every 24 hours 12/03/18 1858 12/06/18 0050   12/03/18 1631  tobramycin (NEBCIN) powder  Status:  Discontinued       As needed 12/03/18  1631 12/03/18 1743   12/03/18 1631  vancomycin (VANCOCIN) powder  Status:  Discontinued       As needed 12/03/18 1631 12/03/18 1743      Subjective:  Jean Stewart seen and examined at bedside no acute distress and resting comfortably with no acute complaints.  Tolerating her diet well.  Denies any issues overnight. Objective: Vitals:   12/08/18 1031 12/08/18 2220 12/09/18 0405 12/09/18 0437  BP: 110/67 (!) 91/54   (!) 94/51  Pulse: 70 80  79  Resp:  16  18  Temp:  97.9 F (36.6 C)  98 F (36.7 C)  TempSrc:  Oral  Oral  SpO2:  100%  100%  Weight:   (!) 150.8 kg   Height:        Intake/Output Summary (Last 24 hours) at 12/09/2018 1836 Last data filed at 12/09/2018 1145 Gross per 24 hour  Intake 240 ml  Output 1200 ml  Net -960 ml   Filed Weights   12/07/18 0647 12/08/18 0727 12/09/18 0405  Weight: (!) 150.5 kg (!) 151.2 kg (!) 150.8 kg    Examination:  Physical Exam Vitals signs and nursing note reviewed.  Constitutional:      Appearance: Normal appearance.  HENT:     Head: Normocephalic and atraumatic.  Eyes:     Extraocular Movements: Extraocular movements intact.  Neck:     Musculoskeletal: Normal range of motion. No neck rigidity.  Cardiovascular:     Rate and Rhythm: Normal rate.     Heart sounds: Normal heart sounds.  Pulmonary:     Effort: Pulmonary effort is normal. No respiratory distress.  Abdominal:     General: Abdomen is flat.     Palpations: Abdomen is soft.  Musculoskeletal:     Right lower leg: No edema.     Left lower leg: No edema.     Comments: Ortho hardware in place on Left ankle  Neurological:     General: No focal deficit present.     Mental Status: She is alert.  Psychiatric:        Mood and Affect: Mood normal.        Behavior: Behavior normal.       Data Reviewed: I have personally reviewed following labs and imaging studies  CBC: Recent Labs  Lab 12/03/18 1002 12/04/18 0204 12/05/18 0257 12/06/18 0228 12/07/18 1445  WBC 5.5 8.9 9.5 7.8 6.2  NEUTROABS 3.1  --  6.5 4.1  --   HGB 11.4* 11.0* 8.5* 8.3* 8.6*  HCT 36.8 34.3* 26.9* 25.1* 26.5*  MCV 101.4* 98.8 98.5 98.0 99.3  PLT 143* 143* 117* 116* [id number]*   Basic Metabolic Panel: Recent Labs  Lab 12/03/18 1002 12/05/18 0257 12/06/18 0228 12/07/18 1445 12/09/18 1159  NA 139 137 137 139 140  K 4.9 4.0 4.4 4.7 4.3  CL 100 99 100 104 106  CO2 25 26 26 25 26   GLUCOSE 131* 116*  90 99 104*  BUN 23 38* 40* 39* 31*  CREATININE 1.59* 2.34* 2.15* 1.82* 1.44*  CALCIUM 9.1 8.6* 8.5* 8.6* 8.7*   GFR: Estimated Creatinine Clearance: 50.7 mL/min (A) (by C-G formula based on SCr of 1.44 mg/dL (H)). Liver Function Tests: No results for input(s): AST, ALT, ALKPHOS, BILITOT, PROT, ALBUMIN in the last 168 hours. No results for input(s): LIPASE, AMYLASE in the last 168 hours. No results for input(s): AMMONIA in the last 168 hours. Coagulation Profile: Recent Labs  Lab 12/05/18 0257 12/06/18 0228 12/07/18 0220 12/08/18 0326  12/09/18 0221  INR 1.7* 1.9* 2.2* 2.5* 2.8*   Cardiac Enzymes: No results for input(s): CKTOTAL, CKMB, CKMBINDEX, TROPONINI in the last 168 hours. BNP (last 3 results) No results for input(s): PROBNP in the last 8760 hours. HbA1C: No results for input(s): HGBA1C in the last 72 hours. CBG: Recent Labs  Lab 12/05/18 0744  GLUCAP 133*   Lipid Profile: No results for input(s): CHOL, HDL, LDLCALC, TRIG, CHOLHDL, LDLDIRECT in the last 72 hours. Thyroid Function Tests: No results for input(s): TSH, T4TOTAL, FREET4, T3FREE, THYROIDAB in the last 72 hours. Anemia Panel: No results for input(s): VITAMINB12, FOLATE, FERRITIN, TIBC, IRON, RETICCTPCT in the last 72 hours. Sepsis Labs: No results for input(s): PROCALCITON, LATICACIDVEN in the last 168 hours.  Recent Results (from the past 240 hour(s))  SARS Coronavirus 2 [facility] order, Performed in [facility] hospital lab) Nasopharyngeal Nasopharyngeal Swab     Status: None   Collection Time: 12/03/18 10:11 AM   Specimen: Nasopharyngeal Swab  Result Value Ref Range Status   SARS Coronavirus 2 NEGATIVE NEGATIVE Final    Comment: (NOTE) If result is NEGATIVE SARS-CoV-2 target nucleic acids are NOT DETECTED. The SARS-CoV-2 RNA is generally detectable in upper and lower  respiratory specimens during the acute phase of infection. The lowest  concentration of SARS-CoV-2 viral copies this assay can  detect is 250  copies / mL. A negative result does not preclude SARS-CoV-2 infection  and should not be used as the sole basis for treatment or other  Jean Stewart management decisions.  A negative result may occur with  improper specimen collection / handling, submission of specimen other  than nasopharyngeal swab, presence of viral mutation(s) within the  areas targeted by this assay, and inadequate number of viral copies  (<250 copies / mL). A negative result must be combined with clinical  observations, Jean Stewart history, and epidemiological information. If result is POSITIVE SARS-CoV-2 target nucleic acids are DETECTED. The SARS-CoV-2 RNA is generally detectable in upper and lower  respiratory specimens dur ing the acute phase of infection.  Positive  results are indicative of active infection with SARS-CoV-2.  Clinical  correlation with Jean Stewart history and other diagnostic information is  necessary to determine Jean Stewart infection status.  Positive results do  not rule out bacterial infection or co-infection with other viruses. If result is PRESUMPTIVE POSTIVE SARS-CoV-2 nucleic acids MAY BE PRESENT.   A presumptive positive result was obtained on the submitted specimen  and confirmed on repeat testing.  While 2019 novel coronavirus  (SARS-CoV-2) nucleic acids may be present in the submitted sample  additional confirmatory testing may be necessary for epidemiological  and / or clinical management purposes  to differentiate between  SARS-CoV-2 and other Sarbecovirus currently known to infect humans.  If clinically indicated additional testing with an alternate test  methodology [id number]) is advised. The SARS-CoV-2 RNA is generally  detectable in upper and lower respiratory sp ecimens during the acute  phase of infection. The expected result is Negative. Fact Sheet for Patients:  StrictlyIdeas.no Fact Sheet for Healthcare Providers:  BankingDealers.co.za This test is not yet approved or cleared by the [place] FDA and has been authorized for detection and/or diagnosis of SARS-CoV-2 by FDA under an Emergency Use Authorization (EUA).  This EUA will remain in effect (meaning this test can be used) for the duration of the COVID-19 declaration under Section 564(b)(1) of the Act, 21 U.S.C. section 360bbb-3(b)(1), unless the authorization is terminated or revoked sooner. Performed at [facility],  [address] [place], [place] [address]   Novel Coronavirus, NAA (hospital order; send-out to ref lab)     Status: None   Collection Time: 12/07/18  3:15 PM   Specimen: Nasopharyngeal Swab; Respiratory  Result Value Ref Range Status   SARS-CoV-2, NAA NOT DETECTED NOT DETECTED Final    Comment: (NOTE) This nucleic acid amplification test was developed and its performance characteristics determined by [employer]. Nucleic acid amplification tests include PCR and TMA. This test has not been FDA cleared or approved. This test has been authorized by FDA under an Emergency Use Authorization (EUA). This test is only authorized for the duration of time the declaration that circumstances exist justifying the authorization of the emergency use of in vitro diagnostic tests for detection of SARS-CoV-2 virus and/or diagnosis of COVID-19 infection under section 564(b)(1) of the Act, 21 U.S.C. [id number]) (1), unless the authorization is terminated or revoked sooner. When diagnostic testing is negative, the possibility of a false negative result should be considered in the context of a Jean Stewart's recent exposures and the presence of clinical signs and symptoms consistent with COVID-19. An individual without symptoms of COVID- 19 and who is not shedding SARS-CoV-2 vi rus would expect to have a negative (not detected) result in this assay. Performed At: [facility] [address]  [place], [place] [id number] Rush Farmer MD [id number]    [place]  Final    Comment: Performed at [facility], [place] [address], [place], [place] [address]         Radiology Studies: No results found.      Scheduled Meds: . acetaminophen  1,000 mg Oral Q6H  . cholecalciferol  5,000 Units Oral Daily  . gabapentin  100 mg Oral TID  . influenza vaccine adjuvanted  0.5 mL Intramuscular Tomorrow-1000  . metoprolol succinate  100 mg Oral Daily  . Vitamin D (Ergocalciferol)  50,000 Units Oral Q7 days  . Warfarin - Pharmacist Dosing Inpatient   Does not apply q1800   Continuous Infusions:    LOS: 6 days    Time spent: 15 mins.More than 50% of that time was spent in counseling and/or coordination of care.      Harold Hedge, DO [facility] Pager [phone]  If 7PM-7AM, please contact night-coverage [email or web address] Password [facility] 12/09/2018, 6:36 PM

## 2018-12-10 LAB — VITAMIN B12: Vitamin B-12: 328 pg/mL (ref 180–914)

## 2018-12-10 LAB — CBC
HCT: 24.2 % — ABNORMAL LOW (ref 36.0–46.0)
Hemoglobin: 7.7 g/dL — ABNORMAL LOW (ref 12.0–15.0)
MCH: 31.8 pg (ref 26.0–34.0)
MCHC: 31.8 g/dL (ref 30.0–36.0)
MCV: 100 fL (ref 80.0–100.0)
Platelets: 197 10*3/uL (ref 150–400)
RBC: 2.42 MIL/uL — ABNORMAL LOW (ref 3.87–5.11)
RDW: 14 % (ref 11.5–15.5)
WBC: 5.6 10*3/uL (ref 4.0–10.5)
nRBC: 0 % (ref 0.0–0.2)

## 2018-12-10 LAB — PROTIME-INR
INR: 2.3 — ABNORMAL HIGH (ref 0.8–1.2)
Prothrombin Time: 24.7 seconds — ABNORMAL HIGH (ref 11.4–15.2)

## 2018-12-10 MED ORDER — WARFARIN SODIUM 5 MG PO TABS
5.0000 mg | ORAL_TABLET | Freq: Every day | ORAL | Status: DC
  Administered 2018-12-10: 5 mg via ORAL
  Filled 2018-12-10: qty 1

## 2018-12-10 MED ORDER — GABAPENTIN 100 MG PO CAPS: 100.0000 mg | ORAL_CAPSULE | Freq: Three times a day (TID) | ORAL | Status: DC

## 2018-12-10 MED ORDER — INFLUENZA VAC A&B SA ADJ QUAD 0.5 ML IM PRSY: 0.5000 mL | PREFILLED_SYRINGE | INTRAMUSCULAR | 0 refills | Status: AC

## 2018-12-10 MED ORDER — VITAMIN D (ERGOCALCIFEROL) 1.25 MG (50000 UNIT) PO CAPS: 50000.0000 [IU] | ORAL_CAPSULE | ORAL | Status: DC

## 2018-12-10 MED ORDER — TIZANIDINE HCL 4 MG PO TABS: 4.0000 mg | ORAL_TABLET | Freq: Two times a day (BID) | ORAL | 0 refills | Status: AC | PRN

## 2018-12-10 NOTE — Progress Notes (Signed)
Patient was discharged to nursing home [facility]) by MD order; discharged instructions review and sent to facility via PT[facility] with care notes; IV DIC; facility was called and report was given to the nurse who is going to receive the patient; patient will be transported to facility via [place].

## 2018-12-10 NOTE — Progress Notes (Signed)
ANTICOAGULATION CONSULT NOTE - Follow Up Consult  Pharmacy Consult for Coumadin Indication: atrial fibrillation  No Known Allergies  Patient Measurements: Height: 5' 5.5" (166.4 cm) Weight: (!) 331 lb (150.1 kg) IBW/kg (Calculated) : 58.15  Vital Signs: Temp: 98.5 F (36.9 C) (10/05 0504) Temp Source: Oral (10/05 0504) BP: 92/55 (10/05 0504) Pulse Rate: 66 (10/05 0504)  Labs: Recent Labs    12/07/18 1445 12/08/18 0326 12/09/18 0221 12/09/18 1159 12/10/18 0414  HGB 8.6*  --   --   --  7.7*  HCT 26.5*  --   --   --  24.2*  PLT 130*  --   --   --  197  LABPROT  --  26.4* 29.3*  --  24.7*  INR  --  2.5* 2.8*  --  2.3*  CREATININE 1.82*  --   --  1.44*  --     Estimated Creatinine Clearance: 50.6 mL/min (A) (by C-G formula based on SCr of 1.44 mg/dL (H)).   Assessment: On Warfarin PTA for Afib. INR on admission was 2.6 on Warfarin 5 mg daily. On 9/28, received Vit K 5mg  IV for debridement of open ankle fracture; warfarin was restarted on 9/29. - 10/4 INR 2.3 (therapeutic)  Goal of Therapy:  INR 2-3 Monitor platelets by anticoagulation protocol: Yes   Plan:  Continue Coumadin 5mg  daily Daily INR  Gaylan Gerold 12/10/2018,10:05 AM

## 2018-12-10 NOTE — Progress Notes (Addendum)
Insurance authorization for SNF/GHC placement still  pending. Liaison with [facility] thinking strong chance of authorization to be given today. NCM will continue to monitor and follow. NCM to make pt/family and MD aware. Whitman Hero RN,BSN,CM [phone]

## 2018-12-10 NOTE — TOC Transition Note (Signed)
Transition of Care [facility]) - CM/SW Discharge Note   Patient Details  Name: [name] MRN: [id number] Date of Birth: 1946-02-28  [facility] CM/SW Contact:  Sharin Mons, RN Phone Number: 12/10/2018, 4:09 PM   Clinical Narrative:    Patient will DC to: [facility] Anticipated DC date: 12/10/2018 Family notified: pt stated she will notify daughters Transport [id number]   Per MD patient ready for DC today . RN, patient, patient's family, and facility notified of DC. Discharge Summary and FL2 sent to facility. RN to call report prior to discharge [phone]). RM 106. DC packet on chart. Ambulance transport requested for patient.   RNCM will sign off for now as intervention is no longer needed. Please consult [place] again if new needs arise.   Final next level of care: Skilled Nursing Facility Barriers to Discharge: No Barriers Identified   Patient Goals and CMS Choice Patient states their goals for this hospitalization and ongoing recovery are:: to go to rehab and get better CMS Medicare.gov Compare Post Acute Care list provided to:: Patient Choice offered to / list presented to : Patient  Discharge Placement                Patient to be transferred to facility by: [facility] Name of family member notified: pt stated will notified daughters Patient and family notified of of transfer: 12/10/18  Discharge Plan and Services   Discharge Planning Services: CM Consult            DME Arranged: N/A DME Agency: NA       HH Arranged: NA HH Agency: NA        Social Determinants of Health ([place]) Interventions     Readmission Risk Interventions No flowsheet data found.

## 2018-12-10 NOTE — Progress Notes (Signed)
PROGRESS NOTE    [name]  WR:[id number] DOB: 18-May-1945 DOA: 12/03/2018 PCP: Patient, No Pcp Per   Brief Narrative:  Patient is a 28-year female with history of hypertension, CHF, permanent A. fib on Coumadin, COPD, on oxygen at 2 to 3 L/min at home, CKD stage III, morbid obesity who presented with left ankle pain.  Imagings done in the emergency department showed open fracture /dislocation of left ankle joint.  Orthopedics was consulted and she underwent irrigation and debridement of the left open ankle fracture, ORIF with external fixation placement.  She was also found to have severe vitamin D deficiency and started on vitamin D supplementation.  She will need SNF on discharge.  Medically stable for discharge.  Assessment & Plan:    Principal Problem:   Open fracture dislocation of ankle Active Problems:   Essential hypertension   Permanent atrial fibrillation (HCC)   Type III open trimalleolar fracture of ankle   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HCC)   Chronic anticoagulation   Anemia of chronic disease   Open ankle fracture dislocation: Imagings showed Lateral dislocation of the talus out of the tibiotalar joint with a transverse fracture through the lateral malleolus and a large skin laceration over the medial malleolus.  Orthopedics following. She underwent irrigation and debridement of the left open ankle fracture, ORIF with external fixation placement on 12/03/18.  Continue  dressing.  Not weightbearing on the left leg.  Continue pain management.  She has been given few days of Rocephin post OR. SNF placement at discharge. Covid Negative x 2.  Macrocytic anemia on Coumadin asymptomatic.  Has a chronically low hemoglobin.  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  Baseline creatinine ranged from 1.5-1.8, peaked at 2.3 improved with IV  fluids.  monitor  Combined systolic/diastolic CHF: Compensated.  Last ejection fraction of 40 to 45% as per echocardiogram done in 3/20.  She is on Lasix at home 3 times a week.  Lasix on hold due to acute kidney injury.  Hold Lasix with borderline low BPs.  Blood pressure still soft.   Continue to hold Lasix  Permanent Atrial fibrillation:  Heart rate well controlled.  On anticoagulation with warfarin.  INR 2.5  Thrombocytopenia continue to monitor  Vitamin D deficiency.  Started on supplementation.  COPD: On home oxygen at 2 to 3 L/min, currently on 2 L nasal cannula.  Currently COPD stable.  Denies any shortness of breath or wheezing. can titrate oxygen down as tolerated, goal SPO2 greater than 90%  Intermittent asymptomatic Hypertension: Currently blood pressure stable.  Continue current medicines  Thrombocytopenia: Chronic.  Stable.  Continue to monitor  Morbid obesity: BMI of 66.2           DVT prophylaxis:Coumadin Code Status: Full Disposition Plan: Patient selected [facility] for SNF placement, Insurance authorization in process. COVID test negative x 2   Consultants: Ortho  Procedures: I&D, ORIF, external fixator placement  Antimicrobials:  Anti-infectives (From admission, onward)   Start     Dose/Rate Route Frequency Ordered Stop   12/04/18 0600  ceFAZolin (ANCEF) 3 g in dextrose 5 % 50 mL IVPB     3 g 100 mL/hr over 30 Minutes Intravenous On call to O.R. 12/03/18 1419 12/03/18 1526   12/03/18 2100  cefTRIAXone (ROCEPHIN) 2 g in sodium chloride 0.9 % 100 mL IVPB  2 g 200 mL/hr over 30 Minutes Intravenous Every 24 hours 12/03/18 1858 12/06/18 0050   12/03/18 1631  tobramycin (NEBCIN) powder  Status:  Discontinued       As needed 12/03/18 1631 12/03/18 1743   12/03/18 1631  vancomycin (VANCOCIN) powder  Status:  Discontinued       As needed 12/03/18 1631 12/03/18 1743      Subjective:  Patient evaluated at bedside today.  She denies any swelling, pain,  bruising.  She denies any trauma.  She denies any GI bleeding, palpitations or chest pain or dyspnea.  Objective: Vitals:   12/09/18 2204 12/10/18 0502 12/10/18 0504 12/10/18 1258  BP: (!) 91/57  (!) 92/55 (!) 105/51  Pulse: 89  66 66  Resp: 16  17 16   Temp: 98.1 F (36.7 C)  98.5 F (36.9 C) 97.7 F (36.5 C)  TempSrc: Oral  Oral Oral  SpO2: 100%  100% 100%  Weight:  (!) 150.1 kg    Height:        Intake/Output Summary (Last 24 hours) at 12/10/2018 1356 Last data filed at 12/10/2018 1137 Gross per 24 hour  Intake 600 ml  Output 2650 ml  Net -2050 ml   Filed Weights   12/08/18 0727 12/09/18 0405 12/10/18 0502  Weight: (!) 151.2 kg (!) 150.8 kg (!) 150.1 kg    Examination:  Physical Exam Vitals signs and nursing note reviewed.  Constitutional:      Appearance: She is obese.  HENT:     Head: Normocephalic and atraumatic.  Eyes:     Extraocular Movements: Extraocular movements intact.  Neck:     Musculoskeletal: Normal range of motion. No neck rigidity.  Cardiovascular:     Rate and Rhythm: Normal rate.     Heart sounds: Normal heart sounds.  Pulmonary:     Effort: Pulmonary effort is normal. No respiratory distress.  Abdominal:     General: Abdomen is flat. Bowel sounds are normal.     Palpations: Abdomen is soft.     Tenderness: There is no abdominal tenderness.  Musculoskeletal:     Comments: No sign of bleeding  Skin:    Findings: No bruising or erythema.  Neurological:     General: No focal deficit present.     Mental Status: She is alert.  Psychiatric:        Mood and Affect: Mood normal.        Behavior: Behavior normal.        Thought Content: Thought content normal.        Judgment: Judgment normal.       Data Reviewed: I have personally reviewed following labs and imaging studies  CBC: Recent Labs  Lab 12/04/18 0204 12/05/18 0257 12/06/18 0228 12/07/18 1445 12/10/18 0414  WBC 8.9 9.5 7.8 6.2 5.6  NEUTROABS  --  6.5 4.1  --   --   HGB  11.0* 8.5* 8.3* 8.6* 7.7*  HCT 34.3* 26.9* 25.1* 26.5* 24.2*  MCV 98.8 98.5 98.0 99.3 100.0  PLT 143* 117* 116* 130* [id number]   Basic Metabolic Panel: Recent Labs  Lab 12/05/18 0257 12/06/18 0228 12/07/18 1445 12/09/18 1159  NA 137 137 139 140  K 4.0 4.4 4.7 4.3  CL 99 100 104 106  CO2 26 26 25 26   GLUCOSE 116* 90 99 104*  BUN 38* 40* 39* 31*  CREATININE 2.34* 2.15* 1.82* 1.44*  CALCIUM 8.6* 8.5* 8.6* 8.7*   GFR: Estimated Creatinine Clearance: 50.6 mL/min (A) (by  C-G formula based on SCr of 1.44 mg/dL (H)). Liver Function Tests: No results for input(s): AST, ALT, ALKPHOS, BILITOT, PROT, ALBUMIN in the last 168 hours. No results for input(s): LIPASE, AMYLASE in the last 168 hours. No results for input(s): AMMONIA in the last 168 hours. Coagulation Profile: Recent Labs  Lab 12/06/18 0228 12/07/18 0220 12/08/18 0326 12/09/18 0221 12/10/18 0414  INR 1.9* 2.2* 2.5* 2.8* 2.3*   Cardiac Enzymes: No results for input(s): CKTOTAL, CKMB, CKMBINDEX, TROPONINI in the last 168 hours. BNP (last 3 results) No results for input(s): PROBNP in the last 8760 hours. HbA1C: No results for input(s): HGBA1C in the last 72 hours. CBG: Recent Labs  Lab 12/05/18 0744  GLUCAP 133*   Lipid Profile: No results for input(s): CHOL, HDL, LDLCALC, TRIG, CHOLHDL, LDLDIRECT in the last 72 hours. Thyroid Function Tests: No results for input(s): TSH, T4TOTAL, FREET4, T3FREE, THYROIDAB in the last 72 hours. Anemia Panel: No results for input(s): VITAMINB12, FOLATE, FERRITIN, TIBC, IRON, RETICCTPCT in the last 72 hours. Sepsis Labs: No results for input(s): PROCALCITON, LATICACIDVEN in the last 168 hours.  Recent Results (from the past 240 hour(s))  SARS Coronavirus 2 [facility] order, Performed in [facility] hospital lab) Nasopharyngeal Nasopharyngeal Swab     Status: None   Collection Time: 12/03/18 10:11 AM   Specimen: Nasopharyngeal Swab  Result Value Ref Range Status   SARS Coronavirus 2  NEGATIVE NEGATIVE Final    Comment: (NOTE) If result is NEGATIVE SARS-CoV-2 target nucleic acids are NOT DETECTED. The SARS-CoV-2 RNA is generally detectable in upper and lower  respiratory specimens during the acute phase of infection. The lowest  concentration of SARS-CoV-2 viral copies this assay can detect is 250  copies / mL. A negative result does not preclude SARS-CoV-2 infection  and should not be used as the sole basis for treatment or other  patient management decisions.  A negative result may occur with  improper specimen collection / handling, submission of specimen other  than nasopharyngeal swab, presence of viral mutation(s) within the  areas targeted by this assay, and inadequate number of viral copies  (<250 copies / mL). A negative result must be combined with clinical  observations, patient history, and epidemiological information. If result is POSITIVE SARS-CoV-2 target nucleic acids are DETECTED. The SARS-CoV-2 RNA is generally detectable in upper and lower  respiratory specimens dur ing the acute phase of infection.  Positive  results are indicative of active infection with SARS-CoV-2.  Clinical  correlation with patient history and other diagnostic information is  necessary to determine patient infection status.  Positive results do  not rule out bacterial infection or co-infection with other viruses. If result is PRESUMPTIVE POSTIVE SARS-CoV-2 nucleic acids MAY BE PRESENT.   A presumptive positive result was obtained on the submitted specimen  and confirmed on repeat testing.  While 2019 novel coronavirus  (SARS-CoV-2) nucleic acids may be present in the submitted sample  additional confirmatory testing may be necessary for epidemiological  and / or clinical management purposes  to differentiate between  SARS-CoV-2 and other Sarbecovirus currently known to infect humans.  If clinically indicated additional testing with an alternate test  methodology [phone])  is advised. The SARS-CoV-2 RNA is generally  detectable in upper and lower respiratory sp ecimens during the acute  phase of infection. The expected result is Negative. Fact Sheet for Patients:  StrictlyIdeas.no Fact Sheet for Healthcare Providers: BankingDealers.co.za This test is not yet approved or cleared by the [place] FDA  and has been authorized for detection and/or diagnosis of SARS-CoV-2 by FDA under an Emergency Use Authorization (EUA).  This EUA will remain in effect (meaning this test can be used) for the duration of the COVID-19 declaration under Section 564(b)(1) of the Act, 21 U.S.C. section 360bbb-3(b)(1), unless the authorization is terminated or revoked sooner. Performed at [facility], [place] [address], [place], [place] [address]   Novel Coronavirus, NAA (hospital order; send-out to ref lab)     Status: None   Collection Time: 12/07/18  3:15 PM   Specimen: Nasopharyngeal Swab; Respiratory  Result Value Ref Range Status   SARS-CoV-2, NAA NOT DETECTED NOT DETECTED Final    Comment: (NOTE) This nucleic acid amplification test was developed and its performance characteristics determined by [employer]. Nucleic acid amplification tests include PCR and TMA. This test has not been FDA cleared or approved. This test has been authorized by FDA under an Emergency Use Authorization (EUA). This test is only authorized for the duration of time the declaration that circumstances exist justifying the authorization of the emergency use of in vitro diagnostic tests for detection of SARS-CoV-2 virus and/or diagnosis of COVID-19 infection under section 564(b)(1) of the Act, 21 U.S.C. [id number]) (1), unless the authorization is terminated or revoked sooner. When diagnostic testing is negative, the possibility of a false negative result should be considered in the context of a patient's recent exposures and the  presence of clinical signs and symptoms consistent with COVID-19. An individual without symptoms of COVID- 19 and who is not shedding SARS-CoV-2 vi rus would expect to have a negative (not detected) result in this assay. Performed At: [facility] [address] [place], [place] [id number] Rush Farmer MD [id number]    [place]  Final    Comment: Performed at [facility], [place] [address], [place], [place] [address]         Radiology Studies: No results found.      Scheduled Meds: . acetaminophen  1,000 mg Oral Q6H  . cholecalciferol  5,000 Units Oral Daily  . gabapentin  100 mg Oral TID  . influenza vaccine adjuvanted  0.5 mL Intramuscular Tomorrow-1000  . metoprolol succinate  100 mg Oral Daily  . Vitamin D (Ergocalciferol)  50,000 Units Oral Q7 days  . warfarin  5 mg Oral q1800  . Warfarin - Pharmacist Dosing Inpatient   Does not apply q1800   Continuous Infusions:    LOS: 7 days    Time spent: 25 mins.More than 50% of that time was spent in counseling and/or coordination of care.      Harold Hedge, DO [facility] Pager [phone]  If 7PM-7AM, please contact night-coverage [email or web address] Password [email or web address] 12/10/2018, 1:56 PM

## 2018-12-10 NOTE — Discharge Summary (Signed)
Physician Discharge Summary  [name] [id number] DOB: Feb 24, 1946 DOA: 12/03/2018  PCP: Patient, No Pcp Per  Admit date: 12/03/2018 Discharge date: 12/10/2018  Admitted From: Home Disposition: [facility] SNF   Recommendations for Outpatient Follow-up:  1. Follow up with PCP in 2 to 4 weeks with blood work  2. Follow-up with Ortho in 2 weeks 3. Follow-up CBC for anemia consider evaluation for vitamin B12 deficiency versus myelodysplastic syndrome  Home Health: No Equipment/Devices: None  Discharge Condition: Stable  CODE STATUS: Full  Diet recommendation: Heart Healthy  Brief/Interim Summary: Patient is a 18-year female with history of hypertension, CHF, permanent A. fib on Coumadin, COPD, on oxygen at 2 to 3 L/min at home, CKD stage III, morbid obesity who presented with left ankle pain after getting up from bed to go to the bathroom.  Imagings done in the emergency department showed open fracture /dislocation of left ankle joint.  Orthopedics was consulted and she underwent irrigation and debridement of the left open ankle fracture, ORIF with external fixation placement.  She was also found to have severe vitamin D deficiency and started on vitamin D supplementation.  She will need SNF on discharge.  Medically stable for discharge.  Open ankle fracture dislocation: Imagings showed Lateral dislocation of the talus out of the tibiotalar joint with a transverse fracture through the lateral malleolus and a large skin laceration over the medial malleolus.  Orthopedics following. She underwent irrigation and debridement of the left open ankle fracture, ORIF with external fixation placement on 12/03/18.  Continue  dressing.  Not weightbearing on the left leg.  Continue pain management.  She has been given few days of Rocephin post OR. SNF placement at discharge. Covid Negative x 2.  Macrocytic anemia on Coumadin asymptomatic.  Has a chronically low hemoglobin and history of anemia of chronic  disease.  No signs/symptoms of bleed              Consider myelodysplastic syndrome work-up as outpatient  AKI on CKD stage III/IV: Resolved.  Baseline creatinine ranged from 1.5-1.8, peaked at 2.3 improved with IV fluids.     Combined systolic/diastolic CHF: Compensated.  Last ejection fraction of 40 to 45% as per echocardiogram done in 3/20.  She is on Lasix at home 3 times a week.                Continue to hold Lasix and resume when blood pressures normalized  Permanent Atrial fibrillation:  Heart rate well controlled.  On anticoagulation with warfarin.  INR 2.3  Thrombocytopenia continue to monitor  Vitamin D deficiency.  Started on vitamin D 50,000 IU weekly  COPD: On home oxygen at 2 to 3 L/min, currently on 2 L nasal cannula.  Currently COPD stable.  Denies any shortness of breath or wheezing.  Intermittent asymptomatic Hypertension: Currently blood pressure stable.  Continue current medicines  Thrombocytopenia: Chronic.  Stable.  Continue to monitor  Morbid obesity: BMI of 66.2  Discharge Diagnoses:  Principal Problem:   Open fracture dislocation of ankle Active Problems:   Essential hypertension   Permanent atrial fibrillation (HCC)   Type III open trimalleolar fracture of ankle   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HCC)   Chronic anticoagulation   Anemia of chronic disease  Physical Exam Vitals signs and nursing note reviewed.  Constitutional:      Appearance: She is obese.  HENT:     Head: Normocephalic and atraumatic.  Eyes:     Extraocular Movements: Extraocular movements intact.  Neck:     Musculoskeletal: Normal range of motion. No neck rigidity.  Cardiovascular:     Rate and Rhythm: Normal rate.     Heart sounds: Normal heart sounds.  Pulmonary:     Effort: Pulmonary effort is normal. No respiratory distress.  Abdominal:     General: Abdomen is flat. Bowel sounds are normal.     Palpations: Abdomen is soft.     Tenderness:  There is no abdominal tenderness.  Musculoskeletal:     Comments: No sign of bleeding  Skin:    Findings: No bruising or erythema.  Neurological:     General: No focal deficit present.     Mental Status: She is alert.  Psychiatric:        Mood and Affect: Mood normal.        Behavior: Behavior normal.        Thought Content: Thought content normal.        Judgment: Judgment normal.  Discharge Instructions  Discharge Instructions    Diet - low sodium heart healthy   Complete by: As directed    Discharge instructions   Complete by: As directed    Continue to work with physical therapy Take vitamin D 50,000 IU once weekly Follow-up with your primary care physician with blood work Take gabapentin 100 mg 3 times daily Take tizanidine 4 mg twice daily as needed for pain   Increase activity slowly   Complete by: As directed      Allergies as of 12/10/2018   No Known Allergies     Medication List    TAKE these medications   acetaminophen 500 MG tablet Commonly known as: TYLENOL Take 1,000 mg by mouth every 6 (six) hours as needed for mild pain.   docusate sodium 100 MG capsule Commonly known as: COLACE Take 200 mg by mouth daily as needed for mild constipation.   furosemide 40 MG tablet Commonly known as: LASIX Take 1 tablet (40 mg total) by mouth 2 (two) times daily. What changed: when to take this   gabapentin 100 MG capsule Commonly known as: NEURONTIN Take 1 capsule (100 mg total) by mouth 3 (three) times daily. What changed:   when to take this  reasons to take this   hydrocortisone 2.5 % cream Apply 1 application topically 2 (two) times daily as needed for itching.   influenza vaccine adjuvanted 0.5 ML injection Commonly known as: FLUAD Inject 0.5 mLs into the muscle tomorrow at 10 am for 1 dose.   metoprolol succinate 100 MG 24 hr tablet Commonly known as: TOPROL-XL Take 1 tablet (100 mg total) by mouth daily. Take with or immediately following a  meal.   tiZANidine 4 MG tablet Commonly known as: ZANAFLEX Take 1 tablet (4 mg total) by mouth 2 (two) times daily as needed for up to 15 days.   Vitamin D (Ergocalciferol) 1.25 MG (50000 UT) Caps capsule Commonly known as: DRISDOL Take 1 capsule (50,000 Units total) by mouth every 7 (seven) days. Start taking on: December 11, 2018   warfarin 5 MG tablet Commonly known as: COUMADIN Take 1 tablet (5 mg total) by mouth daily.      Follow-up Information    Haddix, Thomasene Lot, MD. Schedule an appointment as soon as possible for a visit in 2 week(s).   Specialty: Orthopedic Surgery Why: suture removal, repeat x-rays Contact information: [place] [address] [phone]          No Known Allergies  Consultations:  Orthopedic trauma specialist-Dr. Haddix   Procedures/Studies: Dg Ankle Complete Left  Result Date: 12/03/2018 CLINICAL DATA:  Status post ORIF left ankle. External fixator placement left lower leg. EXAM: LEFT ANKLE COMPLETE - 3+ VIEW COMPARISON:  Preoperative radiographs earlier this day. FINDINGS: Lateral plate and screw fixation of distal fibular fracture. Two additional syndesmotic screws. Improved alignment of the ankle mortise. Fixator pin through the calcaneus and mid tibial shaft, partially included. Nondisplaced posterior malleolar fracture. Irregularity about the distal aspect of the medial malleolus. Generalized soft tissue edema with scattered soft tissue air. A wound VAC is in place. IMPRESSION: ORIF of distal fibular fracture with external fixator in place. Improved alignment of the ankle mortise. Nondisplaced posterior malleolar fracture better appreciated on the current exam than on preoperative imaging. Electronically Signed   By: Keith Rake M.D.   On: 12/03/2018 20:03   Dg Ankle Complete Left  Result Date: 12/03/2018 CLINICAL DATA:  Ankle fracture EXAM: LEFT ANKLE COMPLETE - 3+ VIEW; DG C-ARM 1-60 MIN COMPARISON:  12/03/2018  FINDINGS: Eight low resolution intraoperative spot views of the ankle. Total fluoroscopy time was 2 minutes 47 seconds. Initial images demonstrate open fracture deformity medial side of the ankle with residual lateral dislocation of the talus with respect to the distal tibia. Subsequent images demonstrate surgical plate and multiple screw fixation of the distal fibula with 2 long fixating screws across the tibiofibular articulation. IMPRESSION: Intraoperative fluoroscopic assistance provided during surgical fixation of ankle fractures Electronically Signed   By: Donavan Foil M.D.   On: 12/03/2018 19:30   Dg Chest Portable 1 View  Result Date: 12/03/2018 CLINICAL DATA:  Pain status post fall EXAM: PORTABLE CHEST 1 VIEW COMPARISON:  May 13, 2018 FINDINGS: The heart size is enlarged. There is no pneumothorax. No large pleural effusion. There is no displaced fracture. No focal infiltrate. IMPRESSION: Cardiomegaly without evidence for an acute process. Electronically Signed   By: Constance Holster M.D.   On: 12/03/2018 10:40   Dg Ankle Left Port  Result Date: 12/03/2018 CLINICAL DATA:  Left ankle fracture EXAM: PORTABLE LEFT ANKLE - 2 VIEW COMPARISON:  Earlier same day. FINDINGS: Oblique displaced distal fibular metaphysis fracture 7 mm of lateral displacement. Interval improvement in tibiotalar dislocation with persistent 6 mm of lateral subluxation of the talar dome relative to the tibial plafond. Mild osteoarthritis of the tibiotalar joint. Breast no aggressive osseous lesion. IMPRESSION: Oblique displaced distal fibular metaphysis fracture 7 mm of lateral displacement. Interval improvement in tibiotalar dislocation with persistent 6 mm of lateral subluxation of the talar dome relative to the tibial plafond. Electronically Signed   By: Kathreen Devoid   On: 12/03/2018 12:48   Dg Ankle Left Port  Result Date: 12/03/2018 CLINICAL DATA:  Left ankle injury in a fall this morning. Initial encounter. EXAM:  PORTABLE LEFT ANKLE - 2 VIEW COMPARISON:  None. FINDINGS: The talus is laterally dislocated out of the tibiotalar joint. There is a transverse fracture through the lateral malleolus. Large skin laceration is identified medially. Bones appear osteopenic. Hindfoot and midfoot osteoarthritis noted. IMPRESSION: Lateral dislocation of the talus out of the tibiotalar joint with a transverse fracture through the lateral malleolus and a large skin laceration over the medial malleolus. Electronically Signed   By: Inge Rise M.D.   On: 12/03/2018 10:40   Dg C-arm 1-60 Min  Result Date: 12/03/2018 CLINICAL DATA:  Ankle fracture EXAM: LEFT ANKLE COMPLETE - 3+ VIEW; DG C-ARM 1-60 MIN COMPARISON:  12/03/2018 FINDINGS: Eight low  resolution intraoperative spot views of the ankle. Total fluoroscopy time was 2 minutes 47 seconds. Initial images demonstrate open fracture deformity medial side of the ankle with residual lateral dislocation of the talus with respect to the distal tibia. Subsequent images demonstrate surgical plate and multiple screw fixation of the distal fibula with 2 long fixating screws across the tibiofibular articulation. IMPRESSION: Intraoperative fluoroscopic assistance provided during surgical fixation of ankle fractures Electronically Signed   By: Donavan Foil M.D.   On: 12/03/2018 19:30   Discharge Exam: Vitals:   12/10/18 0504 12/10/18 1258  BP: (!) 92/55 (!) 105/51  Pulse: 66 66  Resp: 17 16  Temp: 98.5 F (36.9 C) 97.7 F (36.5 C)  SpO2: 100% 100%   Vitals:   12/09/18 2204 12/10/18 0502 12/10/18 0504 12/10/18 1258  BP: (!) 91/57  (!) 92/55 (!) 105/51  Pulse: 89  66 66  Resp: 16  17 16   Temp: 98.1 F (36.7 C)  98.5 F (36.9 C) 97.7 F (36.5 C)  TempSrc: Oral  Oral Oral  SpO2: 100%  100% 100%  Weight:  (!) 150.1 kg    Height:        General: Pt is alert, awake, not in acute distress Cardiovascular: RRR, S1/S2 +, no rubs, no gallops Respiratory: CTA bilaterally, no  wheezing, no rhonchi Abdominal: Soft, NT, ND, bowel sounds + Extremities: no edema, no cyanosis    The results of significant diagnostics from this hospitalization (including imaging, microbiology, ancillary and laboratory) are listed below for reference.     Microbiology: Recent Results (from the past 240 hour(s))  SARS Coronavirus 2 [facility] order, Performed in [facility] hospital lab) Nasopharyngeal Nasopharyngeal Swab     Status: None   Collection Time: 12/03/18 10:11 AM   Specimen: Nasopharyngeal Swab  Result Value Ref Range Status   SARS Coronavirus 2 NEGATIVE NEGATIVE Final    Comment: (NOTE) If result is NEGATIVE SARS-CoV-2 target nucleic acids are NOT DETECTED. The SARS-CoV-2 RNA is generally detectable in upper and lower  respiratory specimens during the acute phase of infection. The lowest  concentration of SARS-CoV-2 viral copies this assay can detect is 250  copies / mL. A negative result does not preclude SARS-CoV-2 infection  and should not be used as the sole basis for treatment or other  patient management decisions.  A negative result may occur with  improper specimen collection / handling, submission of specimen other  than nasopharyngeal swab, presence of viral mutation(s) within the  areas targeted by this assay, and inadequate number of viral copies  (<250 copies / mL). A negative result must be combined with clinical  observations, patient history, and epidemiological information. If result is POSITIVE SARS-CoV-2 target nucleic acids are DETECTED. The SARS-CoV-2 RNA is generally detectable in upper and lower  respiratory specimens dur ing the acute phase of infection.  Positive  results are indicative of active infection with SARS-CoV-2.  Clinical  correlation with patient history and other diagnostic information is  necessary to determine patient infection status.  Positive results do  not rule out bacterial infection or co-infection with other  viruses. If result is PRESUMPTIVE POSTIVE SARS-CoV-2 nucleic acids MAY BE PRESENT.   A presumptive positive result was obtained on the submitted specimen  and confirmed on repeat testing.  While 2019 novel coronavirus  (SARS-CoV-2) nucleic acids may be present in the submitted sample  additional confirmatory testing may be necessary for epidemiological  and / or clinical management purposes  to differentiate between  SARS-CoV-2 and other Sarbecovirus currently known to infect humans.  If clinically indicated additional testing with an alternate test  methodology [phone]) is advised. The SARS-CoV-2 RNA is generally  detectable in upper and lower respiratory sp ecimens during the acute  phase of infection. The expected result is Negative. Fact Sheet for Patients:  StrictlyIdeas.no Fact Sheet for Healthcare Providers: BankingDealers.co.za This test is not yet approved or cleared by the [place] FDA and has been authorized for detection and/or diagnosis of SARS-CoV-2 by FDA under an Emergency Use Authorization (EUA).  This EUA will remain in effect (meaning this test can be used) for the duration of the COVID-19 declaration under Section 564(b)(1) of the Act, 21 U.S.C. section 360bbb-3(b)(1), unless the authorization is terminated or revoked sooner. Performed at [facility], [place] [address], [place], [place] [address]   Novel Coronavirus, NAA (hospital order; send-out to ref lab)     Status: None   Collection Time: 12/07/18  3:15 PM   Specimen: Nasopharyngeal Swab; Respiratory  Result Value Ref Range Status   SARS-CoV-2, NAA NOT DETECTED NOT DETECTED Final    Comment: (NOTE) This nucleic acid amplification test was developed and its performance characteristics determined by [employer]. Nucleic acid amplification tests include PCR and TMA. This test has not been FDA cleared or approved. This test has been authorized  by FDA under an Emergency Use Authorization (EUA). This test is only authorized for the duration of time the declaration that circumstances exist justifying the authorization of the emergency use of in vitro diagnostic tests for detection of SARS-CoV-2 virus and/or diagnosis of COVID-19 infection under section 564(b)(1) of the Act, 21 U.S.C. [id number]) (1), unless the authorization is terminated or revoked sooner. When diagnostic testing is negative, the possibility of a false negative result should be considered in the context of a patient's recent exposures and the presence of clinical signs and symptoms consistent with COVID-19. An individual without symptoms of COVID- 19 and who is not shedding SARS-CoV-2 vi rus would expect to have a negative (not detected) result in this assay. Performed At: [facility] [address] [place], [place] [id number] Rush Farmer MD [id number]    [place]  Final    Comment: Performed at [facility], [place] [address], [place], [place] [address]     Labs: BNP (last 3 results) Recent Labs    05/11/18 1325  BNP [id number]   Basic Metabolic Panel: Recent Labs  Lab 12/05/18 0257 12/06/18 0228 12/07/18 1445 12/09/18 1159  NA 137 137 139 140  K 4.0 4.4 4.7 4.3  CL 99 100 104 106  CO2 26 26 25 26   GLUCOSE 116* 90 99 104*  BUN 38* 40* 39* 31*  CREATININE 2.34* 2.15* 1.82* 1.44*  CALCIUM 8.6* 8.5* 8.6* 8.7*   Liver Function Tests: No results for input(s): AST, ALT, ALKPHOS, BILITOT, PROT, ALBUMIN in the last 168 hours. No results for input(s): LIPASE, AMYLASE in the last 168 hours. No results for input(s): AMMONIA in the last 168 hours. CBC: Recent Labs  Lab 12/04/18 0204 12/05/18 0257 12/06/18 0228 12/07/18 1445 12/10/18 0414  WBC 8.9 9.5 7.8 6.2 5.6  NEUTROABS  --  6.5 4.1  --   --   HGB 11.0* 8.5* 8.3* 8.6* 7.7*  HCT 34.3* 26.9* 25.1* 26.5* 24.2*  MCV 98.8 98.5 98.0 99.3 100.0  PLT 143*  117* 116* 130* 197   Cardiac Enzymes: No results for input(s): CKTOTAL, CKMB, CKMBINDEX, TROPONINI in the  last 168 hours. BNP: Invalid input(s): POCBNP CBG: Recent Labs  Lab 12/05/18 0744  GLUCAP 133*   D-Dimer No results for input(s): DDIMER in the last 72 hours. Hgb A1c No results for input(s): HGBA1C in the last 72 hours. Lipid Profile No results for input(s): CHOL, HDL, LDLCALC, TRIG, CHOLHDL, LDLDIRECT in the last 72 hours. Thyroid function studies No results for input(s): TSH, T4TOTAL, T3FREE, THYROIDAB in the last 72 hours.  Invalid input(s): FREET3 Anemia work up No results for input(s): VITAMINB12, FOLATE, FERRITIN, TIBC, IRON, RETICCTPCT in the last 72 hours. Urinalysis    Component Value Date/Time   COLORURINE AMBER (A) 05/11/2018 2030   APPEARANCEUR CLOUDY (A) 05/11/2018 2030   LABSPEC 1.026 05/11/2018 2030   PHURINE 5.0 05/11/2018 2030   GLUCOSEU NEGATIVE 05/11/2018 2030   HGBUR SMALL (A) 05/11/2018 2030   BILIRUBINUR SMALL (A) 05/11/2018 2030   [place] NEGATIVE 05/11/2018 2030   PROTEINUR 30 (A) 05/11/2018 2030   NITRITE NEGATIVE 05/11/2018 2030   LEUKOCYTESUR SMALL (A) 05/11/2018 2030   Sepsis Labs Invalid input(s): PROCALCITONIN,  WBC,  LACTICIDVEN Microbiology Recent Results (from the past 240 hour(s))  SARS Coronavirus 2 [facility] order, Performed in [facility] hospital lab) Nasopharyngeal Nasopharyngeal Swab     Status: None   Collection Time: 12/03/18 10:11 AM   Specimen: Nasopharyngeal Swab  Result Value Ref Range Status   SARS Coronavirus 2 NEGATIVE NEGATIVE Final    Comment: (NOTE) If result is NEGATIVE SARS-CoV-2 target nucleic acids are NOT DETECTED. The SARS-CoV-2 RNA is generally detectable in upper and lower  respiratory specimens during the acute phase of infection. The lowest  concentration of SARS-CoV-2 viral copies this assay can detect is 250  copies / mL. A negative result does not preclude SARS-CoV-2 infection  and should  not be used as the sole basis for treatment or other  patient management decisions.  A negative result may occur with  improper specimen collection / handling, submission of specimen other  than nasopharyngeal swab, presence of viral mutation(s) within the  areas targeted by this assay, and inadequate number of viral copies  (<250 copies / mL). A negative result must be combined with clinical  observations, patient history, and epidemiological information. If result is POSITIVE SARS-CoV-2 target nucleic acids are DETECTED. The SARS-CoV-2 RNA is generally detectable in upper and lower  respiratory specimens dur ing the acute phase of infection.  Positive  results are indicative of active infection with SARS-CoV-2.  Clinical  correlation with patient history and other diagnostic information is  necessary to determine patient infection status.  Positive results do  not rule out bacterial infection or co-infection with other viruses. If result is PRESUMPTIVE POSTIVE SARS-CoV-2 nucleic acids MAY BE PRESENT.   A presumptive positive result was obtained on the submitted specimen  and confirmed on repeat testing.  While 2019 novel coronavirus  (SARS-CoV-2) nucleic acids may be present in the submitted sample  additional confirmatory testing may be necessary for epidemiological  and / or clinical management purposes  to differentiate between  SARS-CoV-2 and other Sarbecovirus currently known to infect humans.  If clinically indicated additional testing with an alternate test  methodology [phone]) is advised. The SARS-CoV-2 RNA is generally  detectable in upper and lower respiratory sp ecimens during the acute  phase of infection. The expected result is Negative. Fact Sheet for Patients:  StrictlyIdeas.no Fact Sheet for Healthcare Providers: BankingDealers.co.za This test is not yet approved or cleared by the [place] FDA and has been  authorized for detection and/or diagnosis of SARS-CoV-2 by FDA under an Emergency Use Authorization (EUA).  This EUA will remain in effect (meaning this test can be used) for the duration of the COVID-19 declaration under Section 564(b)(1) of the Act, 21 U.S.C. section 360bbb-3(b)(1), unless the authorization is terminated or revoked sooner. Performed at [facility], [place] [address] [place], [place] [address]   Novel Coronavirus, NAA (hospital order; send-out to ref lab)     Status: None   Collection Time: 12/07/18  3:15 PM   Specimen: Nasopharyngeal Swab; Respiratory  Result Value Ref Range Status   SARS-CoV-2, NAA NOT DETECTED NOT DETECTED Final    Comment: (NOTE) This nucleic acid amplification test was developed and its performance characteristics determined by [employer]. Nucleic acid amplification tests include PCR and TMA. This test has not been FDA cleared or approved. This test has been authorized by FDA under an Emergency Use Authorization (EUA). This test is only authorized for the duration of time the declaration that circumstances exist justifying the authorization of the emergency use of in vitro diagnostic tests for detection of SARS-CoV-2 virus and/or diagnosis of COVID-19 infection under section 564(b)(1) of the Act, 21 U.S.C. [id number]) (1), unless the authorization is terminated or revoked sooner. When diagnostic testing is negative, the possibility of a false negative result should be considered in the context of a patient's recent exposures and the presence of clinical signs and symptoms consistent with COVID-19. An individual without symptoms of COVID- 19 and who is not shedding SARS-CoV-2 vi rus would expect to have a negative (not detected) result in this assay. Performed At: [facility] [address] [place], [place] [id number] Rush Farmer MD [id number]    [place]  Final    Comment: Performed at  [facility], [place] [address] [place],  [address]     Time coordinating discharge: Over 30 minutes  SIGNED:   Harold Hedge, D.O. [facility] 12/10/2018, 3:40 PM

## 2018-12-17 ENCOUNTER — Encounter (HOSPITAL_COMMUNITY): Payer: Self-pay | Admitting: Student

## 2019-01-22 ENCOUNTER — Other Ambulatory Visit (HOSPITAL_COMMUNITY)
Admission: RE | Admit: 2019-01-22 | Discharge: 2019-01-22 | Disposition: A | Discharge status: Home / Self Care | Payer: Medicare Other | Source: Ambulatory Visit | Attending: Student | Admitting: Student

## 2019-01-22 ENCOUNTER — Ambulatory Visit: Payer: Self-pay | Admitting: Student

## 2019-01-22 DIAGNOSIS — Z01812 Encounter for preprocedural laboratory examination: Secondary | ICD-10-CM | POA: Diagnosis present

## 2019-01-22 DIAGNOSIS — Z20828 Contact with and (suspected) exposure to other viral communicable diseases: Secondary | ICD-10-CM | POA: Insufficient documentation

## 2019-01-22 DIAGNOSIS — S82852C Displaced trimalleolar fracture of left lower leg, initial encounter for open fracture type IIIA, IIIB, or IIIC: Secondary | ICD-10-CM | POA: Insufficient documentation

## 2019-01-22 NOTE — H&P (Signed)
Orthopaedic Trauma Service (OTS) H&P  Patient ID: Jean Stewart MRN: [id number] DOB/AGE: Mar 15, 1945 73 y.o.  Reason for Surgery: Removal of external fixator left leg  HPI: Jean Stewart is an 73 y.o. female presenting for removal of external fixator from left leg. Patient sustained a fall on 12/03/2018, resulting in a left open ankle fracture dislocation. She underwent ORIF of the ankle and due to significant instability of the joint despite internal fixation, it was felt that in order to maintain the ankle joint she would best be served with external fixation of the ankle as well for an additional 6-8 weeks. Patient discharged from hospital to [facility] facility for 4 weeks but has now returned home with her daughter. She has remained non-weightbearing to the left leg over the past 6 weeks.   Patient last seen in OTS office 01/22/2019. Remains on Coumadin for A-fib. Denies any fevers or chills. Pain has been well controlled. Denies any issues with her incisions or pin sites, but has not changed her dressings since being discharged form rehab facility  Past Medical History:  Diagnosis Date  . A-fib ([place])   . COPD (chronic obstructive pulmonary disease) ([place])   . Hypertension   . Liver failure ([place])   . Morbid obesity ([place])   . Neuropathy   . Physical deconditioning   . Suspected sleep apnea     Past Surgical History:  Procedure Laterality Date  . EXTERNAL FIXATION LEG Left 12/03/2018   Procedure: EXTERNAL FIXATION LEG;  Surgeon: Shona Needles, MD;  Location: [place];  Service: Orthopedics;  Laterality: Left;  . I&D EXTREMITY Left 12/03/2018   Procedure: IRRIGATION AND DEBRIDEMENT EXTREMITY;  Surgeon: Shona Needles, MD;  Location: [place];  Service: Orthopedics;  Laterality: Left;  . ORIF ANKLE FRACTURE Left 12/03/2018   Procedure: OPEN REDUCTION INTERNAL FIXATION (ORIF) ANKLE FRACTURE;  Surgeon: Shona Needles, MD;  Location: [place];  Service: Orthopedics;  Laterality:  Left;    Family History  Problem Relation Age of Onset  . CAD Mother        MI at age 50    Social History:  reports that she has quit smoking. She has never used smokeless tobacco. She reports previous alcohol use. She reports that she does not use drugs.  Allergies: No Known Allergies  Medications: No current facility-administered medications for this encounter.   Current Outpatient Medications:  .  acetaminophen (TYLENOL) 500 MG tablet, Take 1,000 mg by mouth every 6 (six) hours as needed for mild pain., Disp: , Rfl:  .  docusate sodium (COLACE) 100 MG capsule, Take 200 mg by mouth daily as needed for mild constipation., Disp: , Rfl:  .  furosemide (LASIX) 40 MG tablet, Take 1 tablet (40 mg total) by mouth 2 (two) times daily. (Patient taking differently: Take 40 mg by mouth every Monday, Wednesday, and Friday. ), Disp: 30 tablet, Rfl: 60 .  gabapentin (NEURONTIN) 100 MG capsule, Take 1 capsule (100 mg total) by mouth 3 (three) times daily., Disp:  , Rfl:  .  hydrocortisone 2.5 % cream, Apply 1 application topically 2 (two) times daily as needed for itching., Disp: , Rfl:  .  metoprolol succinate (TOPROL-XL) 100 MG 24 hr tablet, Take 1 tablet (100 mg total) by mouth daily. Take with or immediately following a meal., Disp: 30 tablet, Rfl: 0 .  Vitamin D, Ergocalciferol, (DRISDOL) 1.25 MG (50000 UT) CAPS capsule, Take 1 capsule (50,000 Units total) by mouth every 7 (seven) days.,  Disp: 5 capsule, Rfl:  .  warfarin (COUMADIN) 5 MG tablet, Take 1 tablet (5 mg total) by mouth daily., Disp: 30 tablet, Rfl: 0   ROS: Constitutional: No fever or chills Vision: No changes in vision ENT: No difficulty swallowing CV: No chest pain Pulm: No SOB or wheezing GI: No nausea or vomiting GU: No urgency or inability to hold urine Skin: No poor wound healing Neurologic: No numbness or tingling Psychiatric: No depression or anxiety Heme: No bruising Allergic: No reaction to medications or  food   Exam: There were no vitals taken for this visit. General: NAD Orientation: Alert and oriented x3 Mood and Affect: Mood and affect appropriate Gait: Not assesed Coordination and balance: Within normal limits  Left Lower Extremity: External fixator in place.  Palpable proximal pin sites appear clean dry and intact.  Calcaneal pin sites appear a bit "soupy".  No purulence able to be expressed.  No active drainage.  Lateral incision healing well.  Traumatic medial wound healing well with no surrounding erythema or drainage.  Patient able to wiggle her toes.  Sensation grossly intact.  Neurovascularly intact.  Right Lower Extremity: Skin without lesions. No tenderness to palpation. Full painless ROM, full strength in each muscle groups without evidence of instability.   Medical Decision Making: Data: Imaging: AP and lateral view of left ankle shows fibular plate and syndesmotic screws to be in excellent position. Ankle mortise well maintained. No signs of hardware failure or loosening. Fracture of the lateral malleolus remains well reduced with callus formation noted.  Labs: No results found for this or any previous visit (from the past 168 hour(s)).   Medical history and chart was reviewed and case discussed with medical provider.  Assessment/Plan: 73 year old female status post ORIF of left trimalleolar ankle fracture with placement of external fixator on 12/03/2018  Patient presenting for removal of external fixator under anesthesia on Friday 01/25/2019. Patient has had enough radiographic evidence of healing of the bone to safely proceed with removal of the device.  We will plan to stress the ankle under fluoroscopy while in the operating room to assess stability of the joint. Will plan to allow patient to initiate weightbearing on the left lower extremity if the ankle joint proves to have enough stability. Patient has been instructed to take her Coumadin on Tuesday 01/22/2019, but  not take on 01/13/2019 or 01/24/2019. Patient will be discharged home postoperatively and will plan to follow up with Dr. Doreatha Martin  In the OTS clinic 2 weeks following surgery for repeat x-rays and removal of sutures.   Aser Nylund A. Carmie Kanner [facility] [phone] (office) [email or web address]

## 2019-01-23 LAB — NOVEL CORONAVIRUS, NAA (HOSP ORDER, SEND-OUT TO REF LAB; TAT 18-24 HRS): SARS-CoV-2, NAA: NOT DETECTED

## 2019-01-24 ENCOUNTER — Other Ambulatory Visit: Payer: Self-pay

## 2019-01-24 ENCOUNTER — Encounter (HOSPITAL_COMMUNITY): Payer: Self-pay | Admitting: *Deleted

## 2019-01-24 MED ORDER — DEXTROSE 5 % IV SOLN
3.0000 g | INTRAVENOUS | Status: DC
  Filled 2019-01-24: qty 3000

## 2019-01-24 NOTE — Progress Notes (Signed)
Anesthesia Chart Review: Same day workup  New diagnosis of permanent afib during admission 3/20. EF 45-50% with mod-severe MR by echo 3/20.  She was started on coumadin. Followed up outpatient 05/30/18 with EP cardiology, advised to continue coumadin, followup with PCP. Pt to hold coumadin starting 11/18 per surgeon.   Hx of COPD, on oxygen at 2 to 3 L/min at home.  CKD stage III, baseline creatinine 1.5-1.8.  Morbid obesity, essentially bedbound per notes.   Pt will need DOS labs and eval.   EKG 12/03/18: Afib, Vent rate 92.   TTE 05/12/18:  1. The left ventricle has mild-moderately reduced systolic function, with an ejection fraction of 40-45%. The cavity size was normal. There is moderate concentric left ventricular hypertrophy. Left ventricular diastology could not be evaluated Elevated  left ventricular end-diastolic pressure.  2. The right ventricle has moderately reduced systolic function. The cavity was normal. There is no increase in right ventricular wall thickness.  3. Left atrial size was moderately dilated.  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 The MR jet is posteriorly-directed.  5. The tricuspid valve is normal in structure. Tricuspid valve regurgitation is moderate-severe.  6. The aortic valve is tricuspid Mild thickening of the aortic valve Mild calcification of the aortic valve.  7. There is evidence of plaque in the ascending aorta.  8. Pulmonary hypertension is moderate.  9. The inferior vena cava was dilated in size with <50% respiratory variability. 10. Mild hypokinesis of the left ventricular inferior wall.   Wynonia Musty [facility] Short Stay Center/Anesthesiology Phone [phone] 01/24/2019 1:30 PM

## 2019-01-24 NOTE — Progress Notes (Signed)
Spoke with pt for pre-op call. Pt has hx of A-fib and is on Coumadin. Last dose was yesterday, 01/24/19. Pt states she has been told she has pre-diabetes. Pt states she does not check her blood sugar at home. Pt's last A1C was 6.0 on 05/11/18.   Pt will be arriving via an ambulance (pt is morbidly obese) and returning home by ambulance.   ERAS with a drink ordered for pt but she does not have anyone that can come by the hospital today to pick up the Pre-Surgery Ensure. Instructed pt not to eat food after midnight tonight, but may have clear liquids until 4:30 AM. Pt voiced understanding.

## 2019-01-24 NOTE — Anesthesia Preprocedure Evaluation (Addendum)
Anesthesia Evaluation  Patient identified by MRN, date of birth, ID band Patient awake    Reviewed: Allergy & Precautions, NPO status , Patient's Chart, lab work & pertinent test results  Airway Mallampati: II  TM Distance: >3 FB Neck ROM: Full    Dental no notable dental hx. (+) Teeth Intact   Pulmonary sleep apnea , COPD,  oxygen dependent,    Pulmonary exam normal breath sounds clear to auscultation       Cardiovascular hypertension, Pt. on medications Normal cardiovascular exam+ dysrhythmias Atrial Fibrillation  Rhythm:Regular Rate:Normal     Neuro/Psych  Headaches, negative psych ROS   GI/Hepatic negative GI ROS, Neg liver ROS,   Endo/Other    Renal/GU Renal disease     Musculoskeletal  (+) Arthritis ,   Abdominal (+) + obese,   Peds  Hematology  (+) anemia ,   Anesthesia Other Findings   Reproductive/Obstetrics                            Anesthesia Physical Anesthesia Plan  ASA: IV  Anesthesia Plan: General   Post-op Pain Management:    Induction: Intravenous  PONV Risk Score and Plan: Treatment may vary due to age or medical condition, Ondansetron and Dexamethasone  Airway Management Planned: LMA  Additional Equipment: None  Intra-op Plan:   Post-operative Plan:   Informed Consent: I have reviewed the patients History and Physical, chart, labs and discussed the procedure including the risks, benefits and alternatives for the proposed anesthesia with the patient or authorized representative who has indicated his/her understanding and acceptance.     Dental advisory given  Plan Discussed with: CRNA  Anesthesia Plan Comments: (New diagnosis of permanent afib during admission 3/20. EF 45-50% with mod-severe MR by echo 3/20.  She was started on coumadin. Followed up outpatient 05/30/18 with EP cardiology, advised to continue coumadin, followup with PCP. Pt to hold  coumadin starting 11/18 per surgeon.   Hx of COPD, on oxygen at 2 to 3 L/min at home.  CKD stage III, baseline creatinine 1.5-1.8.  Morbid obesity, essentially bedbound per notes.   Pt will need DOS labs and eval.   EKG 12/03/18: Afib, Vent rate 92.   TTE 05/12/18:  1. The left ventricle has mild-moderately reduced systolic function, with an ejection fraction of 40-45%. The cavity size was normal. There is moderate concentric left ventricular hypertrophy. Left ventricular diastology could not be evaluated Elevated  left ventricular end-diastolic pressure.  2. The right ventricle has moderately reduced systolic function. The cavity was normal. There is no increase in right ventricular wall thickness.  3. Left atrial size was moderately dilated.  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 The MR jet is posteriorly-directed.  5. The tricuspid valve is normal in structure. Tricuspid valve regurgitation is moderate-severe.  6. The aortic valve is tricuspid Mild thickening of the aortic valve Mild calcification of the aortic valve.  7. There is evidence of plaque in the ascending aorta.  8. Pulmonary hypertension is moderate.  9. The inferior vena cava was dilated in size with <50% respiratory variability. 10. Mild hypokinesis of the left ventricular inferior wall.)       Anesthesia Quick Evaluation

## 2019-01-25 ENCOUNTER — Ambulatory Visit (HOSPITAL_COMMUNITY): Payer: Medicare Other | Admitting: Physician Assistant

## 2019-01-25 ENCOUNTER — Ambulatory Visit (HOSPITAL_COMMUNITY): Payer: Medicare Other

## 2019-01-25 ENCOUNTER — Ambulatory Visit (HOSPITAL_COMMUNITY)
Admission: RE | Admit: 2019-01-25 | Discharge: 2019-01-25 | Disposition: A | Discharge status: Home / Self Care | Payer: Medicare Other | Attending: Student | Admitting: Student

## 2019-01-25 ENCOUNTER — Encounter (HOSPITAL_COMMUNITY): Payer: Self-pay | Admitting: General Practice

## 2019-01-25 ENCOUNTER — Encounter (HOSPITAL_COMMUNITY)
Admission: RE | Disposition: A | Discharge status: Home / Self Care | Payer: Self-pay | Source: Home / Self Care | Attending: Student

## 2019-01-25 DIAGNOSIS — W19XXXD Unspecified fall, subsequent encounter: Secondary | ICD-10-CM | POA: Diagnosis not present

## 2019-01-25 DIAGNOSIS — R7303 Prediabetes: Secondary | ICD-10-CM | POA: Insufficient documentation

## 2019-01-25 DIAGNOSIS — J449 Chronic obstructive pulmonary disease, unspecified: Secondary | ICD-10-CM | POA: Diagnosis not present

## 2019-01-25 DIAGNOSIS — I4891 Unspecified atrial fibrillation: Secondary | ICD-10-CM | POA: Insufficient documentation

## 2019-01-25 DIAGNOSIS — S82852C Displaced trimalleolar fracture of left lower leg, initial encounter for open fracture type IIIA, IIIB, or IIIC: Secondary | ICD-10-CM | POA: Diagnosis not present

## 2019-01-25 DIAGNOSIS — G629 Polyneuropathy, unspecified: Secondary | ICD-10-CM | POA: Diagnosis not present

## 2019-01-25 DIAGNOSIS — Z79899 Other long term (current) drug therapy: Secondary | ICD-10-CM | POA: Diagnosis not present

## 2019-01-25 DIAGNOSIS — Z8249 Family history of ischemic heart disease and other diseases of the circulatory system: Secondary | ICD-10-CM | POA: Insufficient documentation

## 2019-01-25 DIAGNOSIS — Z419 Encounter for procedure for purposes other than remedying health state, unspecified: Secondary | ICD-10-CM

## 2019-01-25 DIAGNOSIS — Z6841 Body Mass Index (BMI) 40.0 and over, adult: Secondary | ICD-10-CM | POA: Insufficient documentation

## 2019-01-25 DIAGNOSIS — I10 Essential (primary) hypertension: Secondary | ICD-10-CM | POA: Insufficient documentation

## 2019-01-25 DIAGNOSIS — Z87891 Personal history of nicotine dependence: Secondary | ICD-10-CM | POA: Diagnosis not present

## 2019-01-25 DIAGNOSIS — Z7901 Long term (current) use of anticoagulants: Secondary | ICD-10-CM | POA: Diagnosis not present

## 2019-01-25 DIAGNOSIS — Z472 Encounter for removal of internal fixation device: Secondary | ICD-10-CM | POA: Diagnosis present

## 2019-01-25 HISTORY — DX: Prediabetes: R73.03

## 2019-01-25 HISTORY — DX: Irritable bowel syndrome, unspecified: K58.9

## 2019-01-25 HISTORY — DX: Unspecified osteoarthritis, unspecified site: M19.90

## 2019-01-25 HISTORY — PX: EXTERNAL FIXATION REMOVAL: SHX5040

## 2019-01-25 HISTORY — DX: Peripheral vascular disease, unspecified: I73.9

## 2019-01-25 LAB — BASIC METABOLIC PANEL
Anion gap: 10 (ref 5–15)
BUN: 29 mg/dL — ABNORMAL HIGH (ref 8–23)
CO2: 22 mmol/L (ref 22–32)
Calcium: 9.1 mg/dL (ref 8.9–10.3)
Chloride: 109 mmol/L (ref 98–111)
Creatinine, Ser: 1.54 mg/dL — ABNORMAL HIGH (ref 0.44–1.00)
GFR calc Af Amer: 38 mL/min — ABNORMAL LOW (ref >60)
GFR calc non Af Amer: 33 mL/min — ABNORMAL LOW (ref >60)
Glucose, Bld: 92 mg/dL (ref 70–99)
Potassium: 4.8 mmol/L (ref 3.5–5.1)
Sodium: 141 mmol/L (ref 135–145)

## 2019-01-25 LAB — CBC
HCT: 33.5 % — ABNORMAL LOW (ref 36.0–46.0)
Hemoglobin: 10.5 g/dL — ABNORMAL LOW (ref 12.0–15.0)
MCH: 32.2 pg (ref 26.0–34.0)
MCHC: 31.3 g/dL (ref 30.0–36.0)
MCV: 102.8 fL — ABNORMAL HIGH (ref 80.0–100.0)
Platelets: 210 10*3/uL (ref 150–400)
RBC: 3.26 MIL/uL — ABNORMAL LOW (ref 3.87–5.11)
RDW: 13.5 % (ref 11.5–15.5)
WBC: 6.5 10*3/uL (ref 4.0–10.5)
nRBC: 0 % (ref 0.0–0.2)

## 2019-01-25 LAB — GLUCOSE, CAPILLARY
Glucose-Capillary: 86 mg/dL (ref 70–99)
Glucose-Capillary: 76 mg/dL (ref 70–99)
Glucose-Capillary: 98 mg/dL (ref 70–99)

## 2019-01-25 LAB — PROTIME-INR
INR: 1.3 — ABNORMAL HIGH (ref 0.8–1.2)
Prothrombin Time: 15.9 seconds — ABNORMAL HIGH (ref 11.4–15.2)

## 2019-01-25 SURGERY — REMOVAL, EXTERNAL FIXATION DEVICE, LOWER EXTREMITY
Anesthesia: General | Site: Leg Lower | Laterality: Left

## 2019-01-25 MED ORDER — LIDOCAINE 2% (20 MG/ML) 5 ML SYRINGE
INTRAMUSCULAR | Status: DC | PRN
  Administered 2019-01-25: 100 mg via INTRAVENOUS

## 2019-01-25 MED ORDER — PHENYLEPHRINE HCL-NACL 10-0.9 MG/250ML-% IV SOLN
INTRAVENOUS | Status: DC | PRN
  Administered 2019-01-25: 30 ug/min via INTRAVENOUS

## 2019-01-25 MED ORDER — METOPROLOL SUCCINATE ER 25 MG PO TB24
100.0000 mg | ORAL_TABLET | Freq: Every day | ORAL | Status: DC
  Filled 2019-01-25: qty 4

## 2019-01-25 MED ORDER — FENTANYL CITRATE (PF) 250 MCG/5ML IJ SOLN
INTRAMUSCULAR | Status: AC
  Filled 2019-01-25: qty 5

## 2019-01-25 MED ORDER — LACTATED RINGERS IV SOLN
INTRAVENOUS | Status: DC | PRN
  Administered 2019-01-25: 07:00:00 via INTRAVENOUS

## 2019-01-25 MED ORDER — FENTANYL CITRATE (PF) 250 MCG/5ML IJ SOLN
INTRAMUSCULAR | Status: DC | PRN
  Administered 2019-01-25: 25 ug via INTRAVENOUS
  Administered 2019-01-25: 50 ug via INTRAVENOUS

## 2019-01-25 MED ORDER — TRAMADOL HCL 50 MG PO TABS: 50.0000 mg | ORAL_TABLET | Freq: Two times a day (BID) | ORAL | 0 refills | Status: DC | PRN

## 2019-01-25 MED ORDER — FENTANYL CITRATE (PF) 100 MCG/2ML IJ SOLN: 25.0000 ug | INTRAMUSCULAR | Status: DC | PRN

## 2019-01-25 MED ORDER — ACETAMINOPHEN 10 MG/ML IV SOLN: 1000.0000 mg | Freq: Once | INTRAVENOUS | Status: DC | PRN

## 2019-01-25 MED ORDER — ONDANSETRON HCL 4 MG/2ML IJ SOLN: 4.0000 mg | Freq: Once | INTRAMUSCULAR | Status: DC | PRN

## 2019-01-25 MED ORDER — PROPOFOL 10 MG/ML IV BOLUS
INTRAVENOUS | Status: DC | PRN
  Administered 2019-01-25: 120 mg via INTRAVENOUS

## 2019-01-25 MED ORDER — METOPROLOL SUCCINATE ER 25 MG PO TB24
100.0000 mg | ORAL_TABLET | Freq: Once | ORAL | Status: AC
  Administered 2019-01-25: 07:00:00 100 mg via ORAL

## 2019-01-25 MED ORDER — ONDANSETRON HCL 4 MG/2ML IJ SOLN
INTRAMUSCULAR | Status: DC | PRN
  Administered 2019-01-25: 4 mg via INTRAVENOUS

## 2019-01-25 MED ORDER — DEXAMETHASONE SODIUM PHOSPHATE 10 MG/ML IJ SOLN
INTRAMUSCULAR | Status: DC | PRN
  Administered 2019-01-25: 10 mg via INTRAVENOUS

## 2019-01-25 MED ORDER — PROPOFOL 10 MG/ML IV BOLUS
INTRAVENOUS | Status: AC
  Filled 2019-01-25: qty 40

## 2019-01-25 MED ORDER — CHLORHEXIDINE GLUCONATE 4 % EX LIQD: 60.0000 mL | Freq: Once | CUTANEOUS | Status: DC

## 2019-01-25 SURGICAL SUPPLY — 41 items
BNDG ELASTIC 4X5.8 VLCR STR LF (GAUZE/BANDAGES/DRESSINGS) ×3 IMPLANT
BNDG ELASTIC 6X5.8 VLCR STR LF (GAUZE/BANDAGES/DRESSINGS) ×1 IMPLANT
BNDG GAUZE ELAST 4 BULKY (GAUZE/BANDAGES/DRESSINGS) ×2 IMPLANT
BRUSH SCRUB EZ PLAIN DRY (MISCELLANEOUS) ×6 IMPLANT
CHLORAPREP W/TINT 26 (MISCELLANEOUS) ×1 IMPLANT
COVER SURGICAL LIGHT HANDLE (MISCELLANEOUS) ×2 IMPLANT
COVER WAND RF STERILE (DRAPES) ×1 IMPLANT
DRAPE C-ARM 42X72 X-RAY (DRAPES) IMPLANT
DRAPE C-ARMOR (DRAPES) IMPLANT
DRAPE U-SHAPE 47X51 STRL (DRAPES) ×1 IMPLANT
DRSG ADAPTIC 3X8 NADH LF (GAUZE/BANDAGES/DRESSINGS) ×1 IMPLANT
DRSG MEPILEX BORDER 4X4 (GAUZE/BANDAGES/DRESSINGS) ×8 IMPLANT
ELECT REM PT RETURN 9FT ADLT (ELECTROSURGICAL)
ELECTRODE REM PT RTRN 9FT ADLT (ELECTROSURGICAL) ×1 IMPLANT
GAUZE SPONGE 4X4 12PLY STRL (GAUZE/BANDAGES/DRESSINGS) ×3 IMPLANT
GLOVE BIO SURGEON STRL SZ 6.5 (GLOVE) ×3 IMPLANT
GLOVE BIO SURGEON STRL SZ7.5 (GLOVE) ×4 IMPLANT
GLOVE BIO SURGEONS STRL SZ 6.5 (GLOVE)
GLOVE BIOGEL PI IND STRL 6.5 (GLOVE) ×1 IMPLANT
GLOVE BIOGEL PI IND STRL 7.5 (GLOVE) ×1 IMPLANT
GLOVE BIOGEL PI INDICATOR 6.5 (GLOVE)
GLOVE BIOGEL PI INDICATOR 7.5 (GLOVE)
GOWN STRL REUS W/ TWL LRG LVL3 (GOWN DISPOSABLE) ×2 IMPLANT
GOWN STRL REUS W/TWL LRG LVL3 (GOWN DISPOSABLE)
KIT BASIN OR (CUSTOM PROCEDURE TRAY) ×1 IMPLANT
KIT TURNOVER KIT B (KITS) ×3 IMPLANT
MANIFOLD NEPTUNE II (INSTRUMENTS) ×1 IMPLANT
NS IRRIG 1000ML POUR BTL (IV SOLUTION) ×1 IMPLANT
PACK TOTAL JOINT (CUSTOM PROCEDURE TRAY) ×1 IMPLANT
PAD ARMBOARD 7.5X6 YLW CONV (MISCELLANEOUS) ×2 IMPLANT
PAD CAST 4YDX4 CTTN HI CHSV (CAST SUPPLIES) IMPLANT
PADDING CAST COTTON 4X4 STRL (CAST SUPPLIES) ×2
PADDING CAST COTTON 6X4 STRL (CAST SUPPLIES) ×3 IMPLANT
SPONGE LAP 18X18 RF (DISPOSABLE) ×1 IMPLANT
STAPLER VISISTAT 35W (STAPLE) IMPLANT
SUT MNCRL AB 3-0 PS2 18 (SUTURE) ×1 IMPLANT
SUT MON AB 2-0 CT1 36 (SUTURE) ×1 IMPLANT
TOWEL GREEN STERILE (TOWEL DISPOSABLE) ×2 IMPLANT
TOWEL GREEN STERILE FF (TOWEL DISPOSABLE) ×4 IMPLANT
UNDERPAD 30X30 (UNDERPADS AND DIAPERS) ×3 IMPLANT
WATER STERILE IRR 1000ML POUR (IV SOLUTION) ×2 IMPLANT

## 2019-01-25 NOTE — Anesthesia Procedure Notes (Signed)
Procedure Name: LMA Insertion Date/Time: 01/25/2019 7:37 AM Performed by: Cleda Daub, CRNA Pre-anesthesia Checklist: Patient identified, Emergency Drugs available, Suction available and Patient being monitored Patient Re-evaluated:Patient Re-evaluated prior to induction Oxygen Delivery Method: Circle system utilized Preoxygenation: Pre-oxygenation with 100% oxygen LMA: LMA inserted LMA Size: 4.0 Number of attempts: 1 Placement Confirmation: positive ETCO2 and breath sounds checked- equal and bilateral Tube secured with: Tape Dental Injury: Teeth and Oropharynx as per pre-operative assessment

## 2019-01-25 NOTE — Op Note (Signed)
Orthopaedic Surgery Operative Note (CSN: [id number] ) Date of Surgery: 01/25/2019  Admit Date: 01/25/2019   Diagnoses: Pre-Op Diagnoses: Type III open left trimalleolar ankle fracture s/p external fixation  Post-Op Diagnosis: Same  Procedures: 1. CPT 20694-Removal of external fixator from left ankle 2. CPT 11044-Debridement of left ankle external fixator pin sites  Surgeons : Primary: Roger Kettles, Thomasene Lot, MD  Assistant: Patrecia Pace, PA-C  Location: OR 4   Anesthesia:General  Antibiotics: None   Tourniquet time:None    Estimated Blood Loss:Minimal  Complications:None   Specimens:None  Implants: * No implants in log *   Indications for Surgery: 73 year old female that sustained a left open trimalleolar ankle fracture and underwent I&D and ORIF. Her ankle was highly unstable due to her soft tissue injury and was placed in an external fixator. She has been in an external fixator for about 6 weeks and x-rays showed good healing. I recommended proceeding with removal of external fixator. Risks and benefits were discussed and the patient and her daughter agreed to proceed with surgery. Consent was obtained.  Operative Findings: 1.  Removal of external fixation from left ankle.  Stress fluoroscopy showed stable ankle mortise. 2.  Debridement of external fixator pin site with a curette and irrigation.  Procedure: The patient was identified in the preoperative holding area. Consent was confirmed with the patient and their family and all questions were answered. The operative extremity was marked after confirmation with the patient. she was then brought back to the operating room by our anesthesia colleagues.  She was carefully transferred over to a radiolucent flat top table.  She was placed under general anesthetic.  A timeout was performed to verify the patient procedure and the extremity.  I remove the external fixator device.  The pins were then removed without difficulty.  There  was some loosening of the calcaneal transfixion pin but no signs of gross infection.  Fluoroscopic imaging was then obtained.  There was no signs of any hardware failure or loosening.  The ankle mortise was stable to external rotation stress view.  There is no subluxation of the talus.  The pin sites were then debrided with a curette and irrigated.  A sterile dressing was placed over the pin sites.  She was then awoken from anesthesia and taken the PACU in stable condition.  Post Op Plan/Instructions: Patient will be weightbearing as tolerated in the boot.  She will restart her Coumadin this evening for DVT prophylaxis.  We will plan to see her back in approximately 2 to 3 weeks for repeat x-rays.  I was present and performed the entire surgery.  Patrecia Pace, PA-C did assist me throughout the case. An assistant was necessary given the difficulty in approach, maintenance of reduction and ability to instrument the fracture.   Katha Hamming, MD [facility]

## 2019-01-25 NOTE — Anesthesia Postprocedure Evaluation (Signed)
Anesthesia Post Note  Patient: Jean Stewart  Procedure(s) Performed: REMOVAL EXTERNAL FIXATION LEG (Left Leg Lower)     Patient location during evaluation: PACU Anesthesia Type: General Level of consciousness: awake and alert Pain management: pain level controlled Vital Signs Assessment: post-procedure vital signs reviewed and stable Respiratory status: spontaneous breathing, nonlabored ventilation, respiratory function stable and patient connected to nasal cannula oxygen Cardiovascular status: blood pressure returned to baseline and stable Postop Assessment: no apparent nausea or vomiting Anesthetic complications: no    Last Vitals:  Vitals:   01/25/19 0848 01/25/19 0900  BP: 127/72   Pulse:    Resp: 18 (!) 22  Temp:  36.9 C  SpO2:  99%    Last Pain:  Vitals:   01/25/19 0900  TempSrc:   PainSc: 0-No pain                 Barnet Glasgow

## 2019-01-25 NOTE — Discharge Instructions (Signed)
Orthopaedic Trauma Service Discharge Instructions   General Discharge Instructions  WEIGHT BEARING STATUS: Weightbearing as tolerated on left lower extremity in CAM walking boot  RANGE OF MOTION/ACTIVITY: Unrestricted range of motion left knee and ankle  Wound Care: Okay to remove surgical dressings on Sunday, 01/27/2019.  Incisions can be left open to air if there is no drainage. If incision continues to have drainage, follow wound care instructions below. Okay to shower if no drainage from incisions.  DVT/PE prophylaxis: restart home dose Coumadin  Diet: as you were eating previously.  Can use over the counter stool softeners and bowel preparations, such as Miralax, to help with bowel movements.  Narcotics can be constipating.  Be sure to drink plenty of fluids  PAIN MEDICATION USE AND EXPECTATIONS  You have likely been given narcotic medications to help control your pain.  After a traumatic event that results in an fracture (broken bone) with or without surgery, it is ok to use narcotic pain medications to help control one's pain.  We understand that everyone responds to pain differently and each individual patient will be evaluated on a regular basis for the continued need for narcotic medications. Ideally, narcotic medication use should last no more than 6-8 weeks (coinciding with fracture healing).   As a patient it is your responsibility as well to monitor narcotic medication use and report the amount and frequency you use these medications when you come to your office visit.   We would also advise that if you are using narcotic medications, you should take a dose prior to therapy to maximize you participation.  IF YOU ARE ON NARCOTIC MEDICATIONS IT IS NOT PERMISSIBLE TO OPERATE A MOTOR VEHICLE (MOTORCYCLE/CAR/TRUCK/MOPED) OR HEAVY MACHINERY DO NOT MIX NARCOTICS WITH OTHER CNS (CENTRAL NERVOUS SYSTEM) DEPRESSANTS SUCH AS ALCOHOL   STOP SMOKING OR USING NICOTINE PRODUCTS!!!!  As  discussed nicotine severely impairs your body's ability to heal surgical and traumatic wounds but also impairs bone healing.  Wounds and bone heal by forming microscopic blood vessels (angiogenesis) and nicotine is a vasoconstrictor (essentially, shrinks blood vessels).  Therefore, if vasoconstriction occurs to these microscopic blood vessels they essentially disappear and are unable to deliver necessary nutrients to the healing tissue.  This is one modifiable factor that you can do to dramatically increase your chances of healing your injury.    (This means no smoking, no nicotine gum, patches, etc)  DO NOT USE NONSTEROIDAL ANTI-INFLAMMATORY DRUGS (NSAID'S)  Using products such as Advil (ibuprofen), Aleve (naproxen), Motrin (ibuprofen) for additional pain control during fracture healing can delay and/or prevent the healing response.  If you would like to take over the counter (OTC) medication, Tylenol (acetaminophen) is ok.  However, some narcotic medications that are given for pain control contain acetaminophen as well. Therefore, you should not exceed more than 4000 mg of tylenol in a day if you do not have liver disease.  Also note that there are may OTC medicines, such as cold medicines and allergy medicines that my contain tylenol as well.  If you have any questions about medications and/or interactions please ask your doctor/PA or your pharmacist.      ICE AND ELEVATE INJURED/OPERATIVE EXTREMITY  Using ice and elevating the injured extremity above your heart can help with swelling and pain control.  Icing in a pulsatile fashion, such as 20 minutes on and 20 minutes off, can be followed.    Do not place ice directly on skin. Make sure there is a barrier between  to skin and the ice pack.    Using frozen items such as frozen peas works well as the conform nicely to the are that needs to be iced.  USE AN ACE WRAP OR TED HOSE FOR SWELLING CONTROL  In addition to icing and elevation, Ace wraps or TED  hose are used to help limit and resolve swelling.  It is recommended to use Ace wraps or TED hose until you are informed to stop.    When using Ace Wraps start the wrapping distally (farthest away from the body) and wrap proximally (closer to the body)   Example: If you had surgery on your leg or thing and you do not have a splint on, start the ace wrap at the toes and work your way up to the thigh        If you had surgery on your upper extremity and do not have a splint on, start the ace wrap at your fingers and work your way up to the upper arm   IF YOU ARE IN A CAM BOOT (BLACK BOOT)  You may remove boot periodically. Perform daily dressing changes as noted below.  Wash the liner of the boot regularly and wear a sock when wearing the boot. It is recommended that you sleep in the boot until told otherwise   CALL THE OFFICE WITH ANY QUESTIONS OR CONCERNS: [phone]   VISIT OUR WEBSITE FOR ADDITIONAL INFORMATION: [email or web address]     Discharge Wound Care Instructions  Do NOT apply any ointments, solutions or lotions to pin sites or surgical wounds.  These prevent needed drainage and even though solutions like hydrogen peroxide kill bacteria, they also damage cells lining the pin sites that help fight infection.  Applying lotions or ointments can keep the wounds moist and can cause them to breakdown and open up as well. This can increase the risk for infection. When in doubt call the office.  Surgical incisions should be dressed daily.  If any drainage is noted, use one layer of adaptic, then gauze, Kerlix, and an ace wrap.  Once the incision is completely dry and without drainage, it may be left open to air out.  Showering may begin 36-48 hours later.  Cleaning gently with soap and water.  Traumatic wounds should be dressed daily as well.    One layer of adaptic, gauze, Kerlix, then ace wrap.  The adaptic can be discontinued once the draining has ceased    If you have a wet to dry  dressing: wet the gauze with saline the squeeze as much saline out so the gauze is moist (not soaking wet), place moistened gauze over wound, then place a dry gauze over the moist one, followed by Kerlix wrap, then ace wrap.

## 2019-01-25 NOTE — Interval H&P Note (Signed)
History and Physical Interval Note:  01/25/2019 7:15 AM  Jean Stewart  has presented today for surgery, with the diagnosis of Left ankle fracture.  The various methods of treatment have been discussed with the patient and family. After consideration of risks, benefits and other options for treatment, the patient has consented to  Procedure(s): REMOVAL EXTERNAL FIXATION LEG (Left) as a surgical intervention.  The patient's history has been reviewed, patient examined, no change in status, stable for surgery.  I have reviewed the patient's chart and labs.  Questions were answered to the patient's satisfaction.     Lennette Bihari P Hadd[place]

## 2019-01-25 NOTE — Transfer of Care (Signed)
Immediate Anesthesia Transfer of Care Note  Patient: [name] Hagedorn  Procedure(s) Performed: REMOVAL EXTERNAL FIXATION LEG (Left Leg Lower)  Patient Location: PACU  Anesthesia Type:General  Level of Consciousness: awake, alert , oriented and patient cooperative  Airway & Oxygen Therapy: Patient Spontanous Breathing and Patient connected to face mask oxygen  Post-op Assessment: Report given to RN and Post -op Vital signs reviewed and stable  Post vital signs: Reviewed and stable  Last Vitals:  Vitals Value Taken Time  BP 129/96 01/25/19 0803  Temp    Pulse 86 01/25/19 0803  Resp 18 01/25/19 0804  SpO2 100 % 01/25/19 0803  Vitals shown include unvalidated device data.  Last Pain:  Vitals:   01/25/19 0634  TempSrc:   PainSc: 3       Patients Stated Pain Goal: 3 ([id number])  Complications: No apparent anesthesia complications

## 2019-01-26 ENCOUNTER — Encounter (HOSPITAL_COMMUNITY): Payer: Self-pay | Admitting: Student

## 2019-06-05 ENCOUNTER — Other Ambulatory Visit: Payer: Self-pay

## 2019-06-05 ENCOUNTER — Encounter (HOSPITAL_COMMUNITY): Payer: Self-pay | Admitting: Emergency Medicine

## 2019-06-05 ENCOUNTER — Emergency Department (HOSPITAL_COMMUNITY)
Admission: EM | Admit: 2019-06-05 | Discharge: 2019-06-07 | Disposition: A | Discharge status: Other Acute Inpatient Hospital | Payer: Medicare Other | Attending: Emergency Medicine | Admitting: Emergency Medicine

## 2019-06-05 DIAGNOSIS — Z6841 Body Mass Index (BMI) 40.0 and over, adult: Secondary | ICD-10-CM | POA: Diagnosis not present

## 2019-06-05 DIAGNOSIS — I10 Essential (primary) hypertension: Secondary | ICD-10-CM | POA: Insufficient documentation

## 2019-06-05 DIAGNOSIS — Z20822 Contact with and (suspected) exposure to covid-19: Secondary | ICD-10-CM | POA: Diagnosis not present

## 2019-06-05 DIAGNOSIS — Z7901 Long term (current) use of anticoagulants: Secondary | ICD-10-CM | POA: Diagnosis not present

## 2019-06-05 DIAGNOSIS — I4891 Unspecified atrial fibrillation: Secondary | ICD-10-CM | POA: Insufficient documentation

## 2019-06-05 DIAGNOSIS — E669 Obesity, unspecified: Secondary | ICD-10-CM | POA: Diagnosis not present

## 2019-06-05 DIAGNOSIS — J449 Chronic obstructive pulmonary disease, unspecified: Secondary | ICD-10-CM | POA: Diagnosis not present

## 2019-06-05 DIAGNOSIS — R531 Weakness: Secondary | ICD-10-CM | POA: Diagnosis not present

## 2019-06-05 DIAGNOSIS — R5381 Other malaise: Secondary | ICD-10-CM | POA: Insufficient documentation

## 2019-06-05 DIAGNOSIS — Z79899 Other long term (current) drug therapy: Secondary | ICD-10-CM | POA: Insufficient documentation

## 2019-06-05 LAB — URINALYSIS, ROUTINE W REFLEX MICROSCOPIC
Bilirubin Urine: NEGATIVE
Glucose, UA: NEGATIVE mg/dL
Hgb urine dipstick: NEGATIVE
Ketones, ur: NEGATIVE mg/dL
Leukocytes,Ua: NEGATIVE
Nitrite: NEGATIVE
Protein, ur: 30 mg/dL — AB
Specific Gravity, Urine: 1.02 (ref 1.005–1.030)
pH: 5 (ref 5.0–8.0)

## 2019-06-05 LAB — CBC WITH DIFFERENTIAL/PLATELET
Abs Immature Granulocytes: 0.01 10*3/uL (ref 0.00–0.07)
Basophils Absolute: 0 10*3/uL (ref 0.0–0.1)
Basophils Relative: 0 %
Eosinophils Absolute: 0.2 10*3/uL (ref 0.0–0.5)
Eosinophils Relative: 4 %
HCT: 37 % (ref 36.0–46.0)
Hemoglobin: 11.6 g/dL — ABNORMAL LOW (ref 12.0–15.0)
Immature Granulocytes: 0 %
Lymphocytes Relative: 29 %
Lymphs Abs: 1.4 10*3/uL (ref 0.7–4.0)
MCH: 30.5 pg (ref 26.0–34.0)
MCHC: 31.4 g/dL (ref 30.0–36.0)
MCV: 97.4 fL (ref 80.0–100.0)
Monocytes Absolute: 0.3 10*3/uL (ref 0.1–1.0)
Monocytes Relative: 6 %
Neutro Abs: 2.9 10*3/uL (ref 1.7–7.7)
Neutrophils Relative %: 61 %
Platelets: 152 10*3/uL (ref 150–400)
RBC: 3.8 MIL/uL — ABNORMAL LOW (ref 3.87–5.11)
RDW: 13.7 % (ref 11.5–15.5)
WBC: 4.8 10*3/uL (ref 4.0–10.5)
nRBC: 0 % (ref 0.0–0.2)

## 2019-06-05 LAB — BASIC METABOLIC PANEL
Anion gap: 9 (ref 5–15)
BUN: 16 mg/dL (ref 8–23)
CO2: 27 mmol/L (ref 22–32)
Calcium: 9.2 mg/dL (ref 8.9–10.3)
Chloride: 105 mmol/L (ref 98–111)
Creatinine, Ser: 1.12 mg/dL — ABNORMAL HIGH (ref 0.44–1.00)
GFR calc Af Amer: 56 mL/min — ABNORMAL LOW (ref >60)
GFR calc non Af Amer: 48 mL/min — ABNORMAL LOW (ref >60)
Glucose, Bld: 97 mg/dL (ref 70–99)
Potassium: 4.3 mmol/L (ref 3.5–5.1)
Sodium: 141 mmol/L (ref 135–145)

## 2019-06-05 LAB — PROTIME-INR
INR: 1.2 (ref 0.8–1.2)
Prothrombin Time: 15.3 seconds — ABNORMAL HIGH (ref 11.4–15.2)

## 2019-06-05 MED ORDER — WARFARIN SODIUM 5 MG PO TABS: 5.0000 mg | ORAL_TABLET | Freq: Every day | ORAL | Status: DC

## 2019-06-05 MED ORDER — GABAPENTIN 100 MG PO CAPS
100.0000 mg | ORAL_CAPSULE | Freq: Three times a day (TID) | ORAL | Status: DC
  Administered 2019-06-05 – 2019-06-07 (×5): 100 mg via ORAL
  Filled 2019-06-05 (×5): qty 1

## 2019-06-05 MED ORDER — METOPROLOL SUCCINATE ER 25 MG PO TB24
100.0000 mg | ORAL_TABLET | Freq: Every day | ORAL | Status: DC
  Administered 2019-06-06 – 2019-06-07 (×2): 100 mg via ORAL
  Filled 2019-06-05 (×2): qty 4

## 2019-06-05 MED ORDER — WARFARIN SODIUM 5 MG PO TABS
5.0000 mg | ORAL_TABLET | Freq: Once | ORAL | Status: DC
  Filled 2019-06-05: qty 1

## 2019-06-05 MED ORDER — WARFARIN - PHARMACIST DOSING INPATIENT: Freq: Every day | Status: DC

## 2019-06-05 NOTE — ED Provider Notes (Signed)
[facility] EMERGENCY DEPARTMENT Provider Note   CSN: [id number] Arrival date & time: 06/05/19  0957     History Chief Complaint  Patient presents with  . Urinary Frequency    Jean Stewart is a 74 y.o. female.  HPI She presents for evaluation of dark-colored urine.  Daughter is with her and states that patient has been bedbound for 3 months following ankle injury requiring surgery.  The patient is not walking because of "fear of falling."  She has a PCP who informed family members that he could not see the patient anymore, because she cannot come to his office and get "an INR test."  The patient had rehab following the ankle injury, and some home health, but that has ceased.  There has been no fever, vomiting, chest pain or shortness of breath.  There are no other known modifying factors.    Past Medical History:  Diagnosis Date  . A-fib ([address])   . Arthritis   . COPD (chronic obstructive pulmonary disease) ([place])   . Hypertension   . IBS (irritable bowel syndrome)   . Liver failure ([place])   . Morbid obesity ([place])   . Neuropathy   . Peripheral vascular disease ([place])    blood clots in legs  . Physical deconditioning   . Pre-diabetes   . Suspected sleep apnea     Patient Active Problem List   Diagnosis Date Noted  . Type III open trimalleolar fracture of left ankle 01/22/2019  . Open fracture dislocation of ankle 12/04/2018  . Type III open trimalleolar fracture of ankle 12/04/2018  . 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[place]) 12/04/2018  . Chronic anticoagulation 12/04/2018  . Anemia of chronic disease 12/04/2018  . Pressure injury of skin 05/12/2018  . Atrial fibrillation with RVR ([place]) 05/11/2018  . Obesity, Class III, BMI 40-49.9 (morbid obesity) ([place]) 05/11/2018  . Essential hypertension 05/11/2018  . Renal insufficiency 05/11/2018  . Permanent atrial fibrillation ([place]) 05/11/2018  . Acute respiratory failure with hypoxia and  hypercapnia ([place]) 05/11/2018  . Elevated troponin 05/11/2018    Past Surgical History:  Procedure Laterality Date  . EXTERNAL FIXATION LEG Left 12/03/2018   Procedure: EXTERNAL FIXATION LEG;  Surgeon: Shona Needles, MD;  Location: [facility];  Service: Orthopedics;  Laterality: Left;  . EXTERNAL FIXATION REMOVAL Left 01/25/2019   Procedure: REMOVAL EXTERNAL FIXATION LEG;  Surgeon: Shona Needles, MD;  Location: [facility];  Service: Orthopedics;  Laterality: Left;  . I & D EXTREMITY Left 12/03/2018   Procedure: IRRIGATION AND DEBRIDEMENT EXTREMITY;  Surgeon: Shona Needles, MD;  Location: [facility];  Service: Orthopedics;  Laterality: Left;  . ORIF ANKLE FRACTURE Left 12/03/2018   Procedure: OPEN REDUCTION INTERNAL FIXATION (ORIF) ANKLE FRACTURE;  Surgeon: Shona Needles, MD;  Location: [facility];  Service: Orthopedics;  Laterality: Left;     OB History   No obstetric history on file.     Family History  Problem Relation Age of Onset  . CAD Mother        MI at age 76    Social History   Tobacco Use  . Smoking status: Never Smoker  . Smokeless tobacco: Never Used  Substance Use Topics  . Alcohol use: Not Currently  . Drug use: Never    Home Medications Prior to Admission medications   Medication Sig Start Date End Date Taking? Authorizing Provider  acetaminophen (TYLENOL) 500 MG tablet Take 1,000 mg by mouth every 6 (six) hours  as needed for mild pain.    [provider]  furosemide (LASIX) 40 MG tablet Take 1 tablet (40 mg total) by mouth 2 (two) times daily. Patient not taking: Reported on 01/24/2019 05/19/18   Dessa Phi, DO  gabapentin (NEURONTIN) 100 MG capsule Take 1 capsule (100 mg total) by mouth 3 (three) times daily. 12/10/18   Harold Hedge, MD  hydrocortisone 2.5 % cream Apply 1 application topically 2 (two) times daily as needed for itching. 01/20/18   [provider]  metoprolol succinate (TOPROL-XL) 100 MG 24 hr tablet Take 1 tablet (100 mg total) by  mouth daily. Take with or immediately following a meal. 05/20/18   Dessa Phi, DO  traMADol (ULTRAM) 50 MG tablet Take 1 tablet (50 mg total) by mouth every 12 (twelve) hours as needed. 01/25/19   Delray Alt, PA-C  Vitamin D, Ergocalciferol, (DRISDOL) 1.25 MG (50000 UT) CAPS capsule Take 1 capsule (50,000 Units total) by mouth every 7 (seven) days. 12/11/18   Harold Hedge, MD  warfarin (COUMADIN) 5 MG tablet Take 1 tablet (5 mg total) by mouth daily. Patient taking differently: Take 5 mg by mouth See admin instructions. Take 5 mg tues , thurs. Saturday 05/19/18   Dessa Phi, DO  warfarin (COUMADIN) 7.5 MG tablet Take 7.5 mg by mouth 3 (three) times a week. 01/19/19   [provider]    Allergies    Patient has no known allergies.  Review of Systems   Review of Systems  All other systems reviewed and are negative.   Physical Exam Updated Vital Signs BP (!) 150/97 (BP Location: Left Arm)   Pulse 90   Temp 98.4 F (36.9 C) (Oral)   Resp 20   Ht 5\' 5"  (1.651 m)   Wt (!) 172.4 kg   SpO2 96%   BMI 63.24 kg/m   Physical Exam Vitals and nursing note reviewed.  Constitutional:      General: She is not in acute distress.    Appearance: She is well-developed. She is obese. She is not ill-appearing, toxic-appearing or diaphoretic.  HENT:     Head: Normocephalic and atraumatic.     Right Ear: External ear normal.     Left Ear: External ear normal.  Eyes:     Conjunctiva/sclera: Conjunctivae normal.     Pupils: Pupils are equal, round, and reactive to light.  Neck:     Trachea: Phonation normal.  Cardiovascular:     Rate and Rhythm: Normal rate and regular rhythm.     Heart sounds: Normal heart sounds.  Pulmonary:     Effort: Pulmonary effort is normal.     Breath sounds: Normal breath sounds.  Abdominal:     General: There is no distension.     Palpations: Abdomen is soft.     Tenderness: There is no abdominal tenderness.  Musculoskeletal:        General:  Normal range of motion.     Cervical back: Normal range of motion and neck supple.  Skin:    General: Skin is warm and dry.     Coloration: Skin is not jaundiced.  Neurological:     Mental Status: She is alert.     Cranial Nerves: No cranial nerve deficit.     Sensory: No sensory deficit.     Motor: No abnormal muscle tone.     Coordination: Coordination normal.  Psychiatric:        Mood and Affect: Mood normal.  Behavior: Behavior normal.     ED Results / Procedures / Treatments   Labs (all labs ordered are listed, but only abnormal results are displayed) Labs Reviewed  URINALYSIS, ROUTINE W REFLEX MICROSCOPIC - Abnormal; Notable for the following components:      Result Value   Protein, ur 30 (*)    Bacteria, UA RARE (*)    All other components within normal limits  BASIC METABOLIC PANEL - Abnormal; Notable for the following components:   Creatinine, Ser 1.12 (*)    GFR calc non Af Amer 48 (*)    GFR calc Af Amer 56 (*)    All other components within normal limits  CBC WITH DIFFERENTIAL/PLATELET - Abnormal; Notable for the following components:   RBC 3.80 (*)    Hemoglobin 11.6 (*)    All other components within normal limits  PROTIME-INR - Abnormal; Notable for the following components:   Prothrombin Time 15.3 (*)    All other components within normal limits    EKG None  Radiology No results found.  Procedures Procedures (including critical care time)  Medications Ordered in ED Medications  gabapentin (NEURONTIN) capsule 100 mg (has no administration in time range)  metoprolol succinate (TOPROL-XL) 24 hr tablet 100 mg (has no administration in time range)  warfarin (COUMADIN) tablet 5 mg (has no administration in time range)    ED Course  I have reviewed the triage vital signs and the nursing notes.  Pertinent labs & imaging results that were available during my care of the patient were reviewed by me and considered in my medical decision making  (see chart for details).  Clinical Course as of Jun 05 1635  Wed Jun 05, 2019  1115 Physical therapy assessment ordered, transitional care consult requested   [EW]  1237 Subtherapeutic  Protime-INR(!) [EW]  1237 Normal  Urinalysis, Routine w reflex microscopic(!) [EW]  1237 Normal except creatinine elevated, GFR low  Basic metabolic panel(!) [EW]  7412 Normal except hemoglobin low  CBC with Differential(!) [EW]  1634 She has been seen by physical therapy, who recommended placement in a skilled nursing facility.  Transition of care team is involved for assistance with placement.  I have ordered pharmacy consultation for appropriate Coumadin dosing recommendations.   [EW]    Clinical Course User Index [EW] Daleen Bo, MD   MDM Rules/Calculators/A&P                       Patient Vitals for the past 24 hrs:  BP Temp Temp src Pulse Resp SpO2 Height Weight  06/05/19 1548 (!) 150/97 98.4 F (36.9 C) Oral 90 20 96 % -- --  06/05/19 1330 (!) 145/86 -- -- 86 (!) 22 97 % -- --  06/05/19 1300 (!) 145/96 -- -- 82 17 98 % -- --  06/05/19 1230 (!) 145/87 -- -- 84 20 96 % -- --  06/05/19 1022 -- -- -- -- -- -- 5\' 5"  (1.651 m) (!) 172.4 kg  06/05/19 1019 (!) 156/93 98.9 F (37.2 C) Oral 88 20 97 % -- --  06/05/19 1003 -- -- -- -- -- -- 5' 5.5" (1.664 m) --  06/05/19 1002 (!) 158/111 98.9 F (37.2 C) Oral 95 (!) 22 96 % -- --    4:35 PM Reevaluation with update and discussion. After initial assessment and treatment, an updated evaluation reveals no change in clinical status, patient updated on findings. Daleen Bo   Medical Decision Making: Malaise, weakness, disability  with moderate obesity.  Patient is not thriving at home.  No evidence for urinary tract infection metabolic instability or impending vascular collapse.  Physical therapy saw the patient and recommends placement in skilled nursing facility for assistance and rehabilitation.  Transitions of care team working on  placement.  Jean Stewart was evaluated in Emergency Department on 06/05/2019 for the symptoms described in the history of present illness. She was evaluated in the context of the global COVID-19 pandemic, which necessitated consideration that the patient might be at risk for infection with the SARS-CoV-2 virus that causes COVID-19. Institutional protocols and algorithms that pertain to the evaluation of patients at risk for COVID-19 are in a state of rapid change based on information released by regulatory bodies including the CDC and federal and state organizations. These policies and algorithms were followed during the patient's care in the ED.   CRITICAL CARE- No Performed by: Daleen Bo   Nursing Notes Reviewed/ Care Coordinated Applicable Imaging Reviewed Interpretation of Laboratory Data incorporated into ED treatment  Plan-ongoing management for placement.   Final Clinical Impression(s) / ED Diagnoses Final diagnoses:  Weakness    Rx / DC Orders ED Discharge Orders    None       Daleen Bo, MD 06/05/19 [phone]

## 2019-06-05 NOTE — Care Management (Signed)
RNCM notes consult for SNF placement will await PT evaluation before proceeding with disposition plan.

## 2019-06-05 NOTE — TOC Initial Note (Signed)
Transition of Care [facility]) - Initial/Assessment Note    Patient Details  Name: Jean Stewart MRN: [id number] Date of Birth: Oct 22, 1945  [facility]) CM/SW Contact:    Jean Living, LCSW Phone Number: 06/05/2019, 6:31 PM  Clinical Narrative:  EDCSW met with Pt at bedside.  Pt is a pleasant, engaged woman [employer] who is looking forward to an opportunity to receive rehab and regain her strength so that she may get out and enjoy the weather. Pt states that some of her concerns with her home are that her current DME is worn out and needs to be replaced. She feels that hospital bed needs to be replaced as well as her walker.                   Expected Discharge Plan: [place] Barriers to Discharge: Continued Medical Work up   Patient Goals and CMS Choice Patient states their goals for this hospitalization and ongoing recovery are:: To get up out of bed and enjoy the [place] summer CMS Medicare.gov Compare Post Acute Care list provided to:: Patient Choice offered to / list presented to : Patient  Expected Discharge Plan and Services Expected Discharge Plan: [place] Choice: [place] Stewart arrangements for the past 2 months: Single Family Home                                      Prior Stewart Arrangements/Services Stewart arrangements for the past 2 months: Single Family Home Lives with:: Adult Children Patient language and need for interpreter reviewed:: No Do you feel safe going back to the place where you live?: Yes      Need for Family Participation in Patient Care: Yes (Comment)(Daughter Jean Stewart helps with Pt care) Care giver support system in place?: Yes (comment)   Criminal Activity/Legal Involvement Pertinent to Current Situation/Hospitalization: No - Comment as needed  Activities of Daily Stewart      Permission Sought/Granted                  Emotional  Assessment Appearance:: Appears younger than stated age Attitude/Demeanor/Rapport: Engaged, Gracious Affect (typically observed): Pleasant Orientation: : Oriented to Self, Oriented to Place, Oriented to  Time, Oriented to Situation Alcohol / Substance Use: Not Applicable    Admission diagnosis:  sick Patient Active Problem List   Diagnosis Date Noted  . Type III open trimalleolar fracture of left ankle 01/22/2019  . Open fracture dislocation of ankle 12/04/2018  . Type III open trimalleolar fracture of ankle 12/04/2018  . 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[place]) 12/04/2018  . Chronic anticoagulation 12/04/2018  . Anemia of chronic disease 12/04/2018  . Pressure injury of skin 05/12/2018  . Atrial fibrillation with RVR ([place]) 05/11/2018  . Obesity, Class III, BMI 40-49.9 (morbid obesity) ([place]) 05/11/2018  . Essential hypertension 05/11/2018  . Renal insufficiency 05/11/2018  . Permanent atrial fibrillation ([place]) 05/11/2018  . Acute respiratory failure with hypoxia and hypercapnia ([place]) 05/11/2018  . Elevated troponin 05/11/2018   PCP:  Patient, No Pcp Per Pharmacy:   [facility] [address] ([address] [place] [address] [place] ([place])  [address] Phone: [phone] Fax: [phone]     Social Determinants of Health (SDOH) Interventions    Readmission Risk Interventions No flowsheet data found.

## 2019-06-05 NOTE — Progress Notes (Signed)
CSW sent FL2 and 3 therapy notes to a number of area SNFs

## 2019-06-05 NOTE — ED Triage Notes (Signed)
Patient presents from home via EMS, Awake and alert x 4, c/c Urinary frequency associated with dark color urine and poor fluid intake x 3 days. Denies weakness, dizziness, no fever or chills.

## 2019-06-05 NOTE — NC FL2 (Signed)
[place] LEVEL OF CARE SCREENING TOOL     IDENTIFICATION  Patient Name: Jean Stewart Birthdate: January 28, 1946 Sex: female Admission Date (Current Location): 06/05/2019  [facility] and [place] Number:  Herbalist and Address:  [facility] [place] [address] [place], [place] [address]      Provider Number: [id number]  Attending Physician Name and Address:  Default, Provider, MD  Relative Name and Phone Number:  [place], [place] Daughter [phone]/ [phone] / [phone]    Current Level of Care: Hospital Recommended Level of Care: [place] Prior Approval Number:    Date Approved/Denied:   PASRR Number: [id number]  Discharge Plan: SNF    Current Diagnoses: Patient Active Problem List   Diagnosis Date Noted  . Type III open trimalleolar fracture of left ankle 01/22/2019  . Open fracture dislocation of ankle 12/04/2018  . Type III open trimalleolar fracture of ankle 12/04/2018  . 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[place]) 12/04/2018  . Chronic anticoagulation 12/04/2018  . Anemia of chronic disease 12/04/2018  . Pressure injury of skin 05/12/2018  . Atrial fibrillation with RVR ([place]) 05/11/2018  . Obesity, Class III, BMI 40-49.9 (morbid obesity) ([place]) 05/11/2018  . Essential hypertension 05/11/2018  . Renal insufficiency 05/11/2018  . Permanent atrial fibrillation ([place]) 05/11/2018  . Acute respiratory failure with hypoxia and hypercapnia ([place]) 05/11/2018  . Elevated troponin 05/11/2018    Orientation RESPIRATION BLADDER Height & Weight     Self, Time, Situation, Place  Normal Incontinent Weight: (!) 380 lb (172.4 kg) Height:  5\' 5"  (165.1 cm)  BEHAVIORAL SYMPTOMS/MOOD NEUROLOGICAL BOWEL NUTRITION STATUS      Incontinent Diet(Heart healthy)  AMBULATORY STATUS COMMUNICATION OF NEEDS Skin   Extensive Assist Verbally Normal                       Personal Care Assistance Level of  Assistance  Bathing, Feeding, Dressing Bathing Assistance: Limited assistance Feeding assistance: Independent Dressing Assistance: Limited assistance     Functional Limitations Info  Sight, Hearing, Speech Sight Info: Adequate Hearing Info: Adequate Speech Info: Adequate    SPECIAL CARE FACTORS FREQUENCY  OT (By licensed OT), PT (By licensed PT)     PT Frequency: 5x weekly OT Frequency: 5x weekly            Contractures Contractures Info: Not present    Additional Factors Info  Code Status Code Status Info: Full             Current Medications (06/05/2019):  This is the current hospital active medication list Current Facility-Administered Medications  Medication Dose Route Frequency Provider Last Rate Last Admin  . gabapentin (NEURONTIN) capsule 100 mg  100 mg Oral TID Daleen Bo, MD      . metoprolol succinate (TOPROL-XL) 24 hr tablet 100 mg  100 mg Oral Daily Daleen Bo, MD      . warfarin (COUMADIN) tablet 5 mg  5 mg Oral Once Beryle Lathe, [facility]      . [START ON 06/06/2019] Warfarin - Pharmacist Dosing Inpatient   Does not apply q1600 Beryle Lathe, [facility]       Current Outpatient Medications  Medication Sig Dispense Refill  . gabapentin (NEURONTIN) 100 MG capsule Take 1 capsule (100 mg total) by mouth 3 (three) times daily. (Patient taking differently: Take 200 mg by mouth 3 (three) times daily. )    . hydrocortisone 2.5 % cream Apply 1 application topically 2 (  two) times daily as needed for itching.    . metoprolol succinate (TOPROL-XL) 100 MG 24 hr tablet Take 1 tablet (100 mg total) by mouth daily. Take with or immediately following a meal. 30 tablet 0  . warfarin (COUMADIN) 5 MG tablet Take 1 tablet (5 mg total) by mouth daily. 30 tablet 0  . furosemide (LASIX) 40 MG tablet Take 1 tablet (40 mg total) by mouth 2 (two) times daily. (Patient not taking: Reported on 01/24/2019) 30 tablet 60  . traMADol (ULTRAM) 50 MG tablet Take 1 tablet (50  mg total) by mouth every 12 (twelve) hours as needed. (Patient not taking: Reported on 06/05/2019) 15 tablet 0  . Vitamin D, Ergocalciferol, (DRISDOL) 1.25 MG (50000 UT) CAPS capsule Take 1 capsule (50,000 Units total) by mouth every 7 (seven) days. (Patient not taking: Reported on 06/05/2019) 5 capsule      Discharge Medications: Please see discharge summary for a list of discharge medications.  Relevant Imaging Results:  Relevant Lab Results:   Additional Information [id number]  [name], LCSW

## 2019-06-05 NOTE — Evaluation (Signed)
Physical Therapy Evaluation Patient Details Name: Jean Stewart MRN: [id number] DOB: 09-10-1945 Today's Date: 06/05/2019   History of Present Illness  Pt is a 74 y/o female presenting to ED following inability to ambulate. Pt has been bed bound since breaking L ankle. PMH includes a fib, COPD, HTN, PVD, and obesity.   Clinical Impression  Pt admitted secondary to problem above with deficits below. Pt requiring mod A to roll and practiced long sitting X5 using bilateral rail on stretcher. Pt has been bed bound since returning home from rehab following ankle fx. Feel pt would benefit from SNF level therapies at d/c given current deficits. Will continue to follow acutely to maximize functional mobility independence and safety.     Follow Up Recommendations SNF;Supervision/Assistance - 24 hour    Equipment Recommendations  None recommended by PT    Recommendations for Other Services       Precautions / Restrictions Precautions Precautions: Fall Precaution Comments: hx of falling Restrictions Weight Bearing Restrictions: No      Mobility  Bed Mobility Overal bed mobility: Needs Assistance Bed Mobility: Rolling Rolling: Mod assist         General bed mobility comments: Mod A to roll from side to side. Practiced coming up to long sitting as well using BUE to pull on rail. Performed transition to long sitting X5. Unsafe to attempt further mobility with +1 given higher stretcher height and pt has been bed bound for months.   Transfers                    Ambulation/Gait                Stairs            Wheelchair Mobility    Modified Rankin (Stroke Patients Only)       Balance                                             Pertinent Vitals/Pain Pain Assessment: No/denies pain    Home Living Family/patient expects to be discharged to:: Skilled nursing facility                      Prior Function Level of Independence:  Needs assistance   Gait / Transfers Assistance Needed: Pt has not ambulated since breaking her ankle. Has been bed bound.   ADL's / Homemaking Assistance Needed: Bed bound. Pt reports daughter helps with clean up following toileting; has been using diapers.         Hand Dominance        Extremity/Trunk Assessment   Upper Extremity Assessment Upper Extremity Assessment: Overall WFL for tasks assessed    Lower Extremity Assessment Lower Extremity Assessment: Generalized weakness    Cervical / Trunk Assessment Cervical / Trunk Assessment: Normal  Communication   Communication: No difficulties  Cognition Arousal/Alertness: Awake/alert Behavior During Therapy: WFL for tasks assessed/performed Overall Cognitive Status: Within Functional Limits for tasks assessed                                        General Comments General comments (skin integrity, edema, etc.): Pt's daughter present during session     Exercises General Exercises - Lower Extremity Heel Slides: AROM;Both;5 reps  Assessment/Plan    PT Assessment Patient needs continued PT services  PT Problem List Decreased strength;Decreased mobility;Decreased balance;Decreased knowledge of use of DME;Decreased knowledge of precautions       PT Treatment Interventions Gait training;Functional mobility training;Therapeutic activities;Therapeutic exercise;Balance training;Neuromuscular re-education;Patient/family education    PT Goals (Current goals can be found in the Care Plan section)  Acute Rehab PT Goals Patient Stated Goal: to go to rehab before going home PT Goal Formulation: With patient Time For Goal Achievement: 06/19/19 Potential to Achieve Goals: Fair    Frequency Min 2X/week   Barriers to discharge        Co-evaluation               AM-PAC PT "6 Clicks" Mobility  Outcome Measure Help needed turning from your back to your side while in a flat bed without using bedrails?: A  Lot Help needed moving from lying on your back to sitting on the side of a flat bed without using bedrails?: A Lot Help needed moving to and from a bed to a chair (including a wheelchair)?: Total Help needed standing up from a chair using your arms (e.g., wheelchair or bedside chair)?: Total Help needed to walk in hospital room?: Total Help needed climbing 3-5 steps with a railing? : Total 6 Click Score: 8    End of Session   Activity Tolerance: Patient tolerated treatment well Patient left: in bed;with call bell/phone within reach Nurse Communication: Mobility status PT Visit Diagnosis: Difficulty in walking, not elsewhere classified (R26.2);Unsteadiness on feet (R26.81);Muscle weakness (generalized) (M62.81);History of falling (Z91.81)    Time: 4656-8127 PT Time Calculation (min) (ACUTE ONLY): 17 min   Charges:   PT Evaluation $PT Eval Moderate Complexity: 1 Mod          Reuel Derby, PT, DPT  Acute Rehabilitation Services  Pager: [phone] Office: [phone]   Rudean Hitt 06/05/2019, 1:59 PM

## 2019-06-05 NOTE — ED Notes (Signed)
The pt has not had a dinner tyray ordered  Ordered now.  I found a nurse call bell

## 2019-06-05 NOTE — ED Notes (Signed)
The pt has been moved from yellow to hold  I am unable to  Find a nurse call bell charge nurse aware  Alert no distress  Monitor ringing af

## 2019-06-05 NOTE — Progress Notes (Signed)
ANTICOAGULATION CONSULT NOTE - Initial Consult  Pharmacy Consult for warfarin Indication: atrial fibrillation  No Known Allergies  Patient Measurements: Height: 5\' 5"  (165.1 cm) Weight: (!) 380 lb (172.4 kg) IBW/kg (Calculated) : 57 Heparin Dosing Weight: 101.6  Vital Signs: Temp: 98.4 F (36.9 C) (03/31 1548) Temp Source: Oral (03/31 1548) BP: 137/93 (03/31 1600) Pulse Rate: 87 (03/31 1600)  Labs: Recent Labs    06/05/19 1125 06/05/19 1130  HGB 11.6*  --   HCT 37.0  --   PLT 152  --   LABPROT  --  15.3*  INR  --  1.2  CREATININE 1.12*  --     Estimated Creatinine Clearance: 70.7 mL/min (A) (by C-G formula based on SCr of 1.12 mg/dL (H)).   Medical History: Past Medical History:  Diagnosis Date  . A-fib ([place])   . Arthritis   . COPD (chronic obstructive pulmonary disease) ([place])   . Hypertension   . IBS (irritable bowel syndrome)   . Liver failure ([place])   . Morbid obesity ([place])   . Neuropathy   . Peripheral vascular disease ([place])    blood clots in legs  . Physical deconditioning   . Pre-diabetes   . Suspected sleep apnea     Medications:  Scheduled:  . gabapentin  100 mg Oral TID  . metoprolol succinate  100 mg Oral Daily  . warfarin  5 mg Oral Once  . [START ON 06/06/2019] Warfarin - Pharmacist Dosing Inpatient   Does not apply q1600   Infusions:    Assessment: Jean Stewart is a 74 y/o F with a hx of Afib. Pharmacy has been consulted to dose warfarin.  She is on warfarin PTA 5mg  daily. Confirmed with the patient and the daughter that she has been compliant lately. Her last dose was this morning at 7AM.   Given that her INR is 1.2 on admission, will give her an additional 5mg  dose tonight. Unclear whether she is truly compliant on her PTA regimen or if 5mg  daily is not adequate for her. She verbalized to me that she has not increased dietary vitamin k consumption recently. Her diet has remained the same. It appears like she has not been able to get her  INR checked outpatient recently.  Her dispo plan is to a SNF within the next few days. We will have to assess her INR trend and determine the appropriate regimen to send her out on.  Her CBC appears stable.  Goal of Therapy:  INR 2-3 Monitor platelets by anticoagulation protocol: Yes   Plan:  Give warfarin 5mg  x1 tonight (10mg  total today) Check INR daily Continue to monitor H&H and platelets  Kennon Holter, PharmD PGY1 Ambulatory Care Pharmacy Resident 06/05/2019,5:32 PM

## 2019-06-05 NOTE — ED Notes (Signed)
pts dinner tray has arrived

## 2019-06-06 LAB — CBC
HCT: 34.4 % — ABNORMAL LOW (ref 36.0–46.0)
Hemoglobin: 10.8 g/dL — ABNORMAL LOW (ref 12.0–15.0)
MCH: 30.7 pg (ref 26.0–34.0)
MCHC: 31.4 g/dL (ref 30.0–36.0)
MCV: 97.7 fL (ref 80.0–100.0)
Platelets: 158 10*3/uL (ref 150–400)
RBC: 3.52 MIL/uL — ABNORMAL LOW (ref 3.87–5.11)
RDW: 13.7 % (ref 11.5–15.5)
WBC: 5.3 10*3/uL (ref 4.0–10.5)
nRBC: 0 % (ref 0.0–0.2)

## 2019-06-06 LAB — SARS CORONAVIRUS 2 (TAT 6-24 HRS): SARS Coronavirus 2: NEGATIVE

## 2019-06-06 LAB — PROTIME-INR
INR: 1.2 (ref 0.8–1.2)
Prothrombin Time: 15.2 seconds (ref 11.4–15.2)

## 2019-06-06 MED ORDER — WARFARIN SODIUM 10 MG PO TABS
10.0000 mg | ORAL_TABLET | Freq: Once | ORAL | Status: AC
  Administered 2019-06-06: 16:00:00 10 mg via ORAL
  Filled 2019-06-06: qty 1

## 2019-06-06 NOTE — ED Notes (Signed)
Pt talking w/daughter on phone.

## 2019-06-06 NOTE — Care Management (Signed)
RNCM met with pt at bedside to discuss [facility] options.  Pt chose to discharge to [facility].

## 2019-06-06 NOTE — ED Notes (Signed)
SNF   bfast ordered

## 2019-06-06 NOTE — Progress Notes (Addendum)
12:30pm: CSW spoke with Arbie Cookey at [facility] who states this patient's Josem Kaufmann is still pending.  10:30am: CSW faxed clinical information to [facility] to initiate insurance authorization for admission to [facility].  Madilyn Fireman, MSW, LCSW-A Transitions of Care  Clinical Social Worker  [facility] Emergency Departments  Medical ICU [phone]

## 2019-06-06 NOTE — Progress Notes (Signed)
ANTICOAGULATION CONSULT NOTE - Initial Consult  Pharmacy Consult for warfarin Indication: atrial fibrillation  No Known Allergies  Patient Measurements: Height: 5\' 5"  (165.1 cm) Weight: (!) 380 lb (172.4 kg) IBW/kg (Calculated) : 57 Heparin Dosing Weight: 101.6  Vital Signs: BP: 144/96 (04/01 0657) Pulse Rate: 75 (04/01 0657)  Labs: Recent Labs    06/05/19 1125 06/05/19 1130 06/06/19 0243  HGB 11.6*  --  10.8*  HCT 37.0  --  34.4*  PLT 152  --  158  LABPROT  --  15.3* 15.2  INR  --  1.2 1.2  CREATININE 1.12*  --   --     Estimated Creatinine Clearance: 70.7 mL/min (A) (by C-G formula based on SCr of 1.12 mg/dL (H)).   Medical History: Past Medical History:  Diagnosis Date  . A-fib ([place])   . Arthritis   . COPD (chronic obstructive pulmonary disease) ([place])   . Hypertension   . IBS (irritable bowel syndrome)   . Liver failure ([place])   . Morbid obesity ([place])   . Neuropathy   . Peripheral vascular disease ([place])    blood clots in legs  . Physical deconditioning   . Pre-diabetes   . Suspected sleep apnea     Medications:  Scheduled:  . gabapentin  100 mg Oral TID  . metoprolol succinate  100 mg Oral Daily  . warfarin  5 mg Oral Once  . Warfarin - Pharmacist Dosing Inpatient   Does not apply q1600   Infusions:    Assessment: Ms Givhan is a 74 y/o F with a hx of Afib. Pharmacy has been consulted to dose warfarin.  She is on warfarin PTA 5mg  daily. Confirmed with the patient and the daughter that she has been compliant lately. Her last dose was this morning at 7AM.   Given that her INR is 1.2 on admission, will give her an additional 5mg  dose tonight. Unclear whether she is truly compliant on her PTA regimen or if 5mg  daily is not adequate for her. She verbalized to me that she has not increased dietary vitamin k consumption recently. Her diet has remained the same. It appears like she has not been able to get her INR checked outpatient recently.  Her  dispo plan is to a SNF within the next few days. We will have to assess her INR trend and determine the appropriate regimen to send her out on.  INR has not changed - still 1.2 this am only got 5 mg yesterday. Her CBC appears stable.  Goal of Therapy:  INR 2-3 Monitor platelets by anticoagulation protocol: Yes   Plan:  Give warfarin 10 mg x1 tonight Check INR daily Continue to monitor H&H and platelets  Alanda Slim, PharmD, [facility] Clinical Pharmacist Please see AMION for all Pharmacists' Contact Phone Numbers 06/06/2019, 7:31 AM

## 2019-06-06 NOTE — ED Notes (Signed)
CM in w/pt.

## 2019-06-06 NOTE — ED Notes (Signed)
Jean Stewart, daughter, [phone] would like an update when available

## 2019-06-06 NOTE — ED Notes (Signed)
Pt's "oldest daughter" aware of tx plan - waiting on insurance auth for pt to return to [facility] - per pt's request.

## 2019-06-06 NOTE — ED Notes (Signed)
Pt states she is happy w/tx plan - [facility].

## 2019-06-06 NOTE — Progress Notes (Signed)
CSW met with Pt to inform her that ins authorization still has not arrived. Pt expressed understanding.

## 2019-06-07 LAB — CBC
HCT: 35.8 % — ABNORMAL LOW (ref 36.0–46.0)
Hemoglobin: 11.3 g/dL — ABNORMAL LOW (ref 12.0–15.0)
MCH: 31 pg (ref 26.0–34.0)
MCHC: 31.6 g/dL (ref 30.0–36.0)
MCV: 98.1 fL (ref 80.0–100.0)
Platelets: 155 10*3/uL (ref 150–400)
RBC: 3.65 MIL/uL — ABNORMAL LOW (ref 3.87–5.11)
RDW: 13.8 % (ref 11.5–15.5)
WBC: 5.4 10*3/uL (ref 4.0–10.5)
nRBC: 0 % (ref 0.0–0.2)

## 2019-06-07 LAB — PROTIME-INR
INR: 1.3 — ABNORMAL HIGH (ref 0.8–1.2)
Prothrombin Time: 15.6 seconds — ABNORMAL HIGH (ref 11.4–15.2)

## 2019-06-07 MED ORDER — WARFARIN SODIUM 10 MG PO TABS
10.0000 mg | ORAL_TABLET | Freq: Once | ORAL | Status: DC
  Filled 2019-06-07: qty 1

## 2019-06-07 NOTE — ED Notes (Signed)
PTAR at bedside; report given to same.

## 2019-06-07 NOTE — Progress Notes (Signed)
ANTICOAGULATION CONSULT NOTE - Follow-Up Consult  Pharmacy Consult for warfarin Indication: atrial fibrillation  No Known Allergies  Patient Measurements: Height: 5\' 5"  (165.1 cm) Weight: (!) 172.4 kg (380 lb) IBW/kg (Calculated) : 57 Heparin Dosing Weight: 101.6  Vital Signs: Temp: 97.7 F (36.5 C) (04/01 2254) Temp Source: Oral (04/01 2254) BP: 146/113 (04/02 0603) Pulse Rate: 93 (04/02 0603)  Labs: Recent Labs    06/05/19 1125 06/05/19 1125 06/05/19 1130 06/06/19 0243 06/07/19 0512  HGB 11.6*   < >  --  10.8* 11.3*  HCT 37.0  --   --  34.4* 35.8*  PLT 152  --   --  158 155  LABPROT  --   --  15.3* 15.2 15.6*  INR  --   --  1.2 1.2 1.3*  CREATININE 1.12*  --   --   --   --    < > = values in this interval not displayed.    Estimated Creatinine Clearance: 70.7 mL/min (A) (by C-G formula based on SCr of 1.12 mg/dL (H)).  Medications:  Scheduled:  . gabapentin  100 mg Oral TID  . metoprolol succinate  100 mg Oral Daily  . Warfarin - Pharmacist Dosing Inpatient   Does not apply q1600   Infusions:    Assessment: Jean Stewart is a 74 y/o F with a hx of Afib. Pharmacy has been consulted to dose warfarin.  She is on warfarin PTA 5mg  daily. Confirmed with the patient and the daughter that she has been compliant lately. Her last dose was on 3/31 AM. It is unclear whether she is truly compliant on her PTA regimen or if 5mg  daily is not adequate for her. She verbalized to me that she has not increased dietary vitamin k consumption recently. Her diet has remained the same. It appears like she has not been able to get her INR checked outpatient recently.  Her dispo plan is to a SNF within the next few days. We will have to assess her INR trend and determine the appropriate regimen to send her out on.  INR today remains SUBtherapeutic (INR 1.3 << 1.2, goal of 2-3). No CBC today - no bleeding noted.   Goal of Therapy:  INR 2-3 Monitor platelets by anticoagulation  protocol: Yes   Plan:  Repeat Warfarin 10 mg x1 tonight Check INR daily Continue to monitor H&H and platelets  Thank you for allowing pharmacy to be a part of this patient's care.  Alycia Rossetti, PharmD, BCPS Clinical Pharmacist Clinical phone for 06/07/2019: [phone] 06/07/2019 7:43 AM   **Pharmacist phone directory can now be found on [email or web address] ([email or web address]).  Listed under [place].

## 2019-06-07 NOTE — ED Notes (Signed)
Patient verbalizes understanding of discharge instructions. Opportunity for questioning and answers were provided. Armband removed by staff, pt discharged from ED through [place] to [facility].

## 2019-06-07 NOTE — ED Notes (Signed)
Belongings returned to pt; pt stated she would like to remain in her gown and just have a brief placed on.

## 2019-06-07 NOTE — Discharge Instructions (Signed)
Please return for any problem.  °

## 2019-06-07 NOTE — ED Notes (Signed)
ORDERED BREAKFAST--[facility]

## 2019-06-07 NOTE — ED Notes (Addendum)
Breakfast tray given to pt. Currently alert and eating upright in bed.

## 2019-06-07 NOTE — Progress Notes (Addendum)
11:37a CSW received call from [facility] with admission at [facility] who reports she was able to get patient's auth information and patient's Josem Kaufmann has been approved. CSW also received call back from [facility] who cofirmed that patient's Josem Kaufmann has been approved  [place] #[id number] [place] #[id number]  Patient will be going to room 103P. Number for report is [phone].   11:24a CSW awaiting call from [facility] regarding if patient's Josem Kaufmann has been approved. CSW contacted [employer] and per the system there is 101 people in the queue for a call. CSW will continue to follow up.   [place], LCSW Transitions of Care Department [facility] ED [phone]

## 2019-12-20 IMAGING — CT CT HEAD WITHOUT CONTRAST
3 of 4 series · 16 of 47 positions shown, 19 images · non-contrast
Comparison: None.

CLINICAL DATA: Altered sensation on the left side. Slow to speak
and follow commands. Hypertension.

EXAM:
CT HEAD WITHOUT CONTRAST
TECHNIQUE: Contiguous axial images were obtained from the base of the skull
through the vertex without intravenous contrast.

[Series 3: head 5.0 h30s · axial · 0.39mm/px · z∈[-200,-60]mm · 10 of 32 slices shown, 13 images]
[im 2/32  brain]
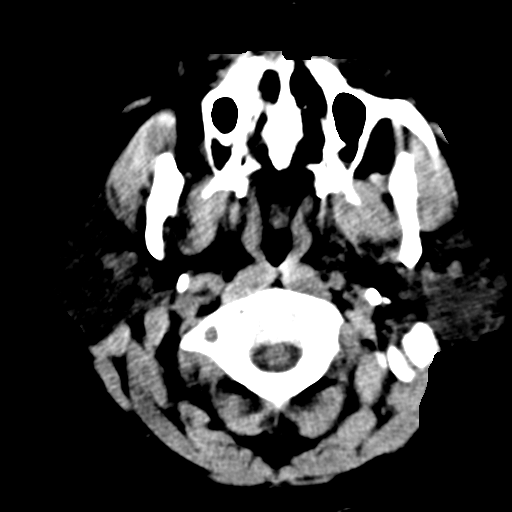
[im 2/32  bone]
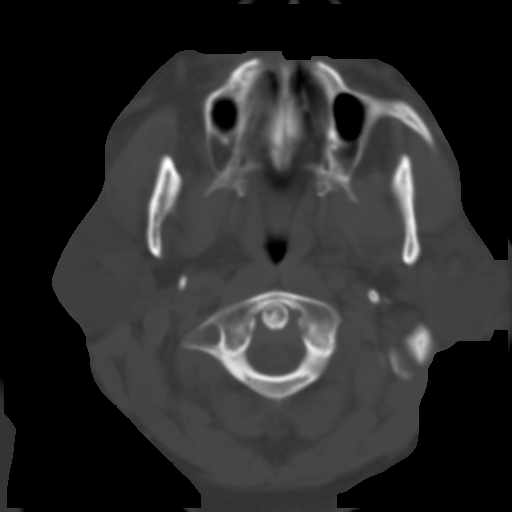
[im 5/32  brain]
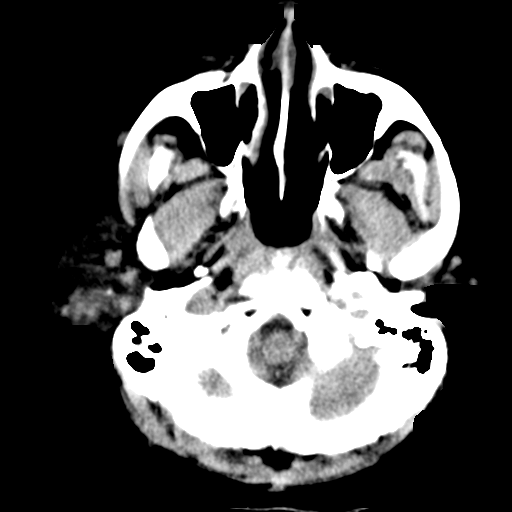
[im 8/32  brain]
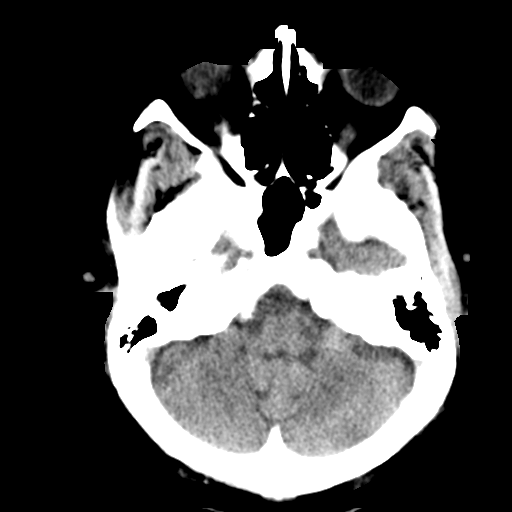
[im 11/32  brain]
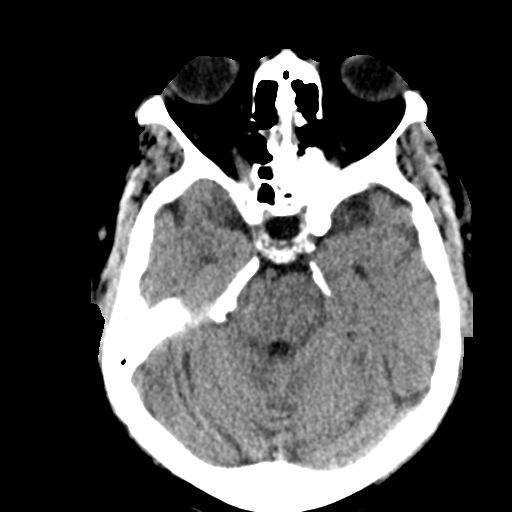
[im 14/32  brain]
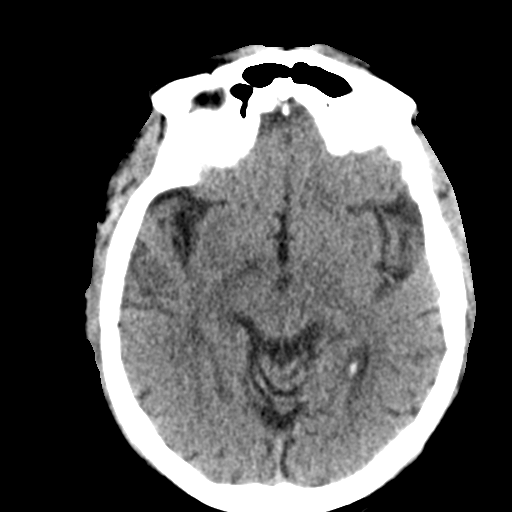
[im 14/32  bone]
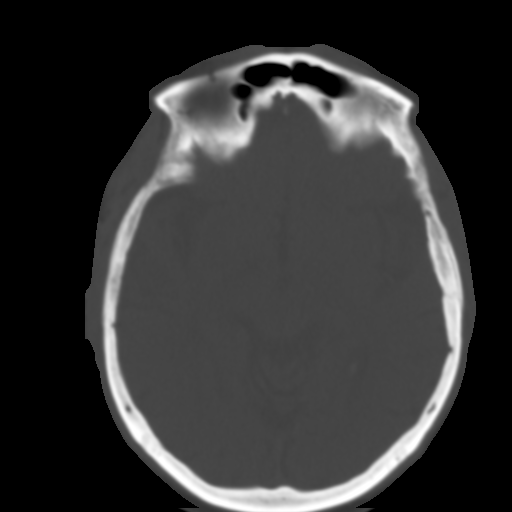
[im 18/32  brain]
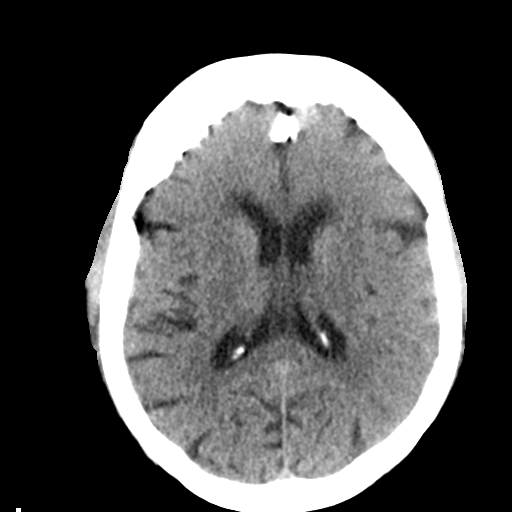
[im 21/32  brain]
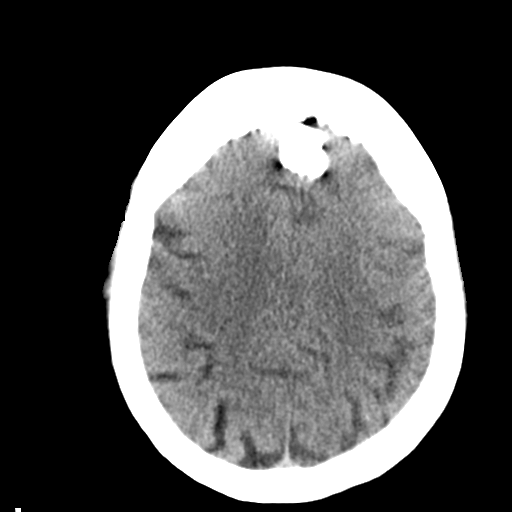
[im 24/32  brain]
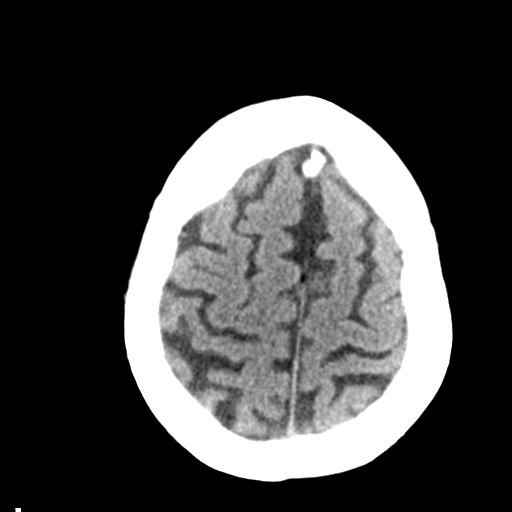
[im 27/32  brain]
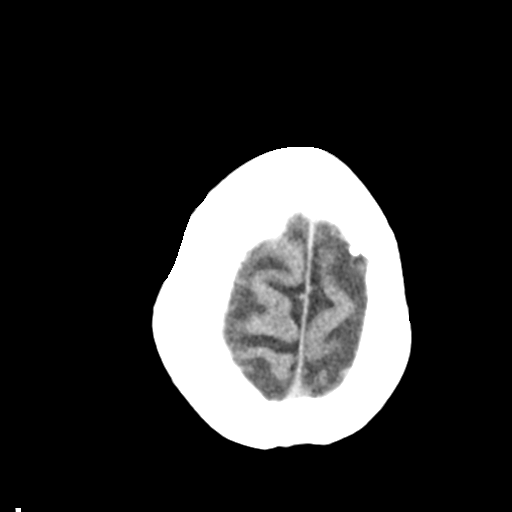
[im 27/32  bone]
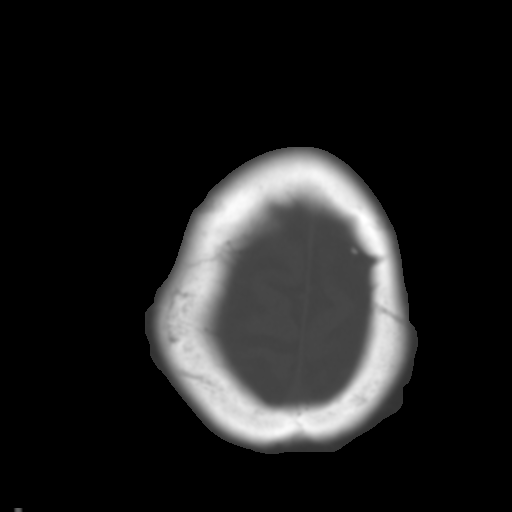
[im 30/32  brain]
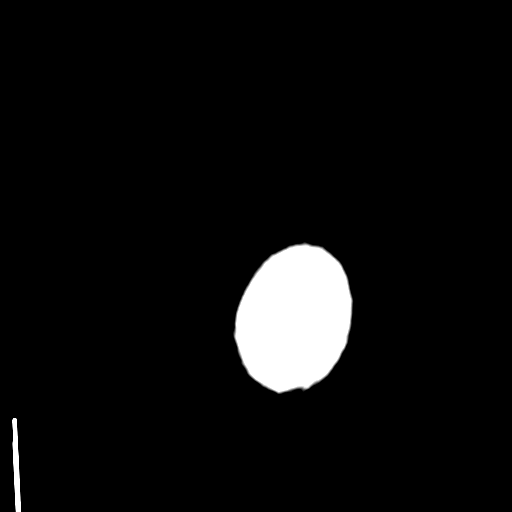

[Series 5: head 3.0 mpr cor · coronal · 0.32mm/px · 3 of 67 slices shown]
[im 23/67  brain]
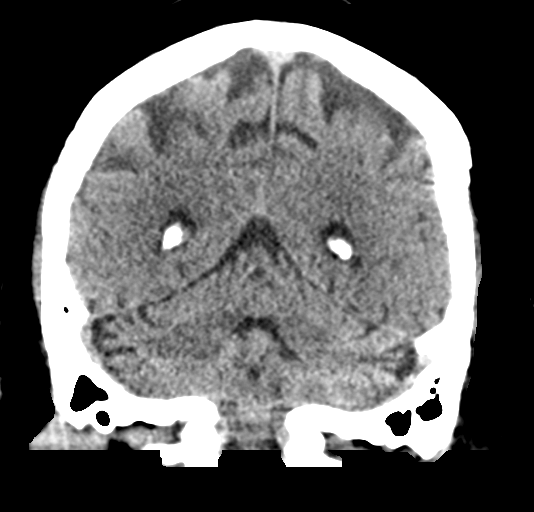
[im 30/67  brain]
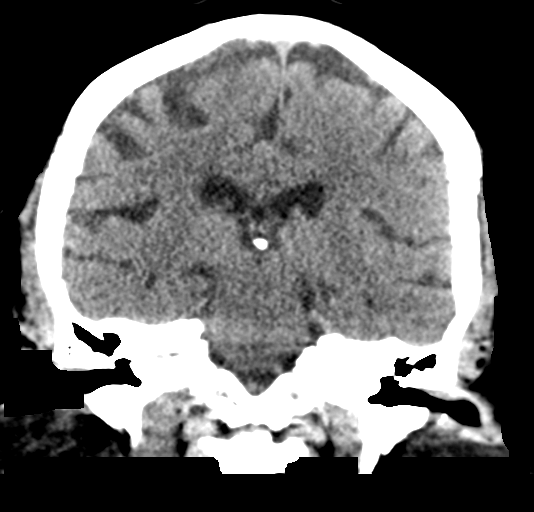
[im 37/67  brain]
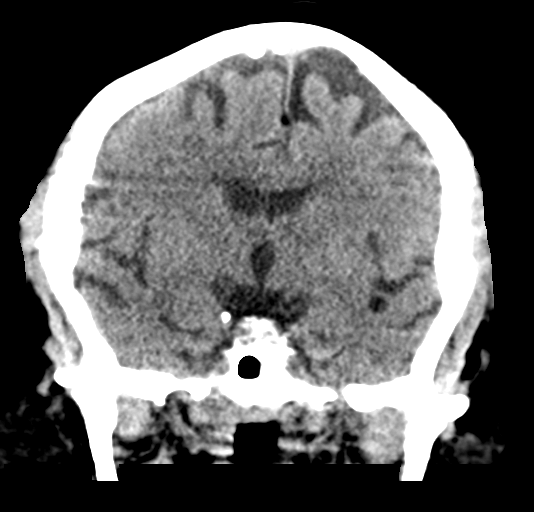

[Series 6: head 3.0 mpr sag · sagittal · 0.33mm/px · 3 of 57 slices shown]
[im 19/57  brain]
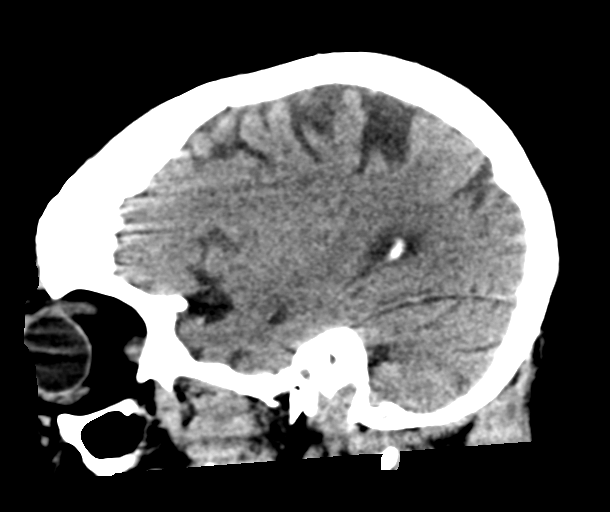
[im 29/57  brain]
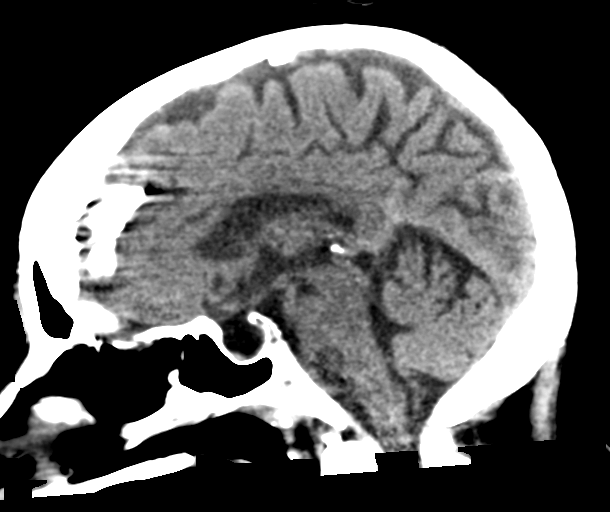
[im 38/57  brain]
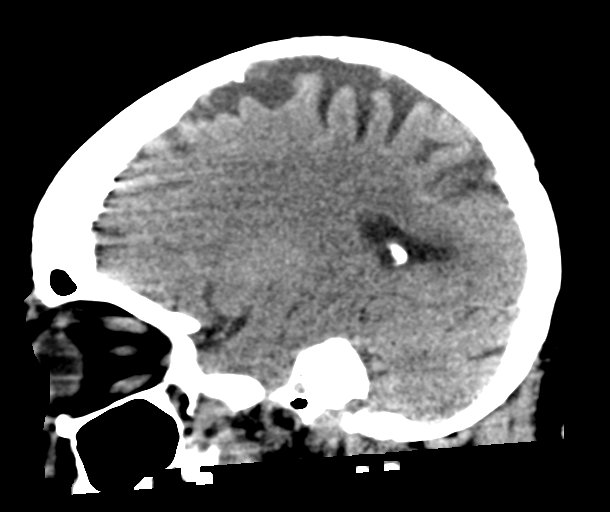

[16 of 47 positions shown; findings below may reference images not displayed]

FINDINGS: Brain: No evidence of acute infarction, hemorrhage, hydrocephalus,
extra-axial collection or mass lesion/mass effect. Diffuse cerebral
atrophy. Low-attenuation changes in the deep white matter consistent
with central atrophy.

Vascular: Mild intracranial arterial vascular calcifications.

Skull: Calvarium appears intact. No acute depressed skull fractures.

Sinuses/Orbits: Paranasal sinuses are clear. Partial opacification
of mastoid air cells bilaterally.

Other: None.
IMPRESSION: No acute intracranial abnormalities. Chronic atrophy and small
vessel ischemic changes. Bilateral partial mastoid effusions.

## 2020-11-16 ENCOUNTER — Inpatient Hospital Stay (HOSPITAL_COMMUNITY)
Admission: EM | Admit: 2020-11-16 | Discharge: 2020-11-27 | DRG: 208 | Disposition: A | Discharge status: Skilled Nursing Facility | Payer: Medicare Other | Source: Skilled Nursing Facility | Attending: Internal Medicine | Admitting: Internal Medicine

## 2020-11-16 ENCOUNTER — Emergency Department (HOSPITAL_COMMUNITY): Payer: Medicare Other

## 2020-11-16 DIAGNOSIS — Z20822 Contact with and (suspected) exposure to covid-19: Secondary | ICD-10-CM | POA: Diagnosis present

## 2020-11-16 DIAGNOSIS — T886XXA Anaphylactic reaction due to adverse effect of correct drug or medicament properly administered, initial encounter: Secondary | ICD-10-CM | POA: Diagnosis not present

## 2020-11-16 DIAGNOSIS — J9621 Acute and chronic respiratory failure with hypoxia: Secondary | ICD-10-CM | POA: Diagnosis not present

## 2020-11-16 DIAGNOSIS — R0902 Hypoxemia: Secondary | ICD-10-CM | POA: Diagnosis present

## 2020-11-16 DIAGNOSIS — I739 Peripheral vascular disease, unspecified: Secondary | ICD-10-CM | POA: Diagnosis present

## 2020-11-16 DIAGNOSIS — R7303 Prediabetes: Secondary | ICD-10-CM | POA: Diagnosis present

## 2020-11-16 DIAGNOSIS — J449 Chronic obstructive pulmonary disease, unspecified: Secondary | ICD-10-CM | POA: Diagnosis present

## 2020-11-16 DIAGNOSIS — Z8249 Family history of ischemic heart disease and other diseases of the circulatory system: Secondary | ICD-10-CM

## 2020-11-16 DIAGNOSIS — J9622 Acute and chronic respiratory failure with hypercapnia: Secondary | ICD-10-CM | POA: Diagnosis present

## 2020-11-16 DIAGNOSIS — E662 Morbid (severe) obesity with alveolar hypoventilation: Secondary | ICD-10-CM | POA: Diagnosis present

## 2020-11-16 DIAGNOSIS — N1831 Chronic kidney disease, stage 3a: Secondary | ICD-10-CM | POA: Diagnosis present

## 2020-11-16 DIAGNOSIS — I5043 Acute on chronic combined systolic (congestive) and diastolic (congestive) heart failure: Secondary | ICD-10-CM | POA: Diagnosis present

## 2020-11-16 DIAGNOSIS — Y849 Medical procedure, unspecified as the cause of abnormal reaction of the patient, or of later complication, without mention of misadventure at the time of the procedure: Secondary | ICD-10-CM | POA: Diagnosis not present

## 2020-11-16 DIAGNOSIS — I480 Paroxysmal atrial fibrillation: Secondary | ICD-10-CM | POA: Diagnosis not present

## 2020-11-16 DIAGNOSIS — I272 Pulmonary hypertension, unspecified: Secondary | ICD-10-CM | POA: Diagnosis present

## 2020-11-16 DIAGNOSIS — T4275XA Adverse effect of unspecified antiepileptic and sedative-hypnotic drugs, initial encounter: Secondary | ICD-10-CM | POA: Diagnosis not present

## 2020-11-16 DIAGNOSIS — R109 Unspecified abdominal pain: Secondary | ICD-10-CM

## 2020-11-16 DIAGNOSIS — Z79899 Other long term (current) drug therapy: Secondary | ICD-10-CM

## 2020-11-16 DIAGNOSIS — R2990 Unspecified symptoms and signs involving the nervous system: Secondary | ICD-10-CM | POA: Diagnosis not present

## 2020-11-16 DIAGNOSIS — E875 Hyperkalemia: Secondary | ICD-10-CM

## 2020-11-16 DIAGNOSIS — Z6841 Body Mass Index (BMI) 40.0 and over, adult: Secondary | ICD-10-CM

## 2020-11-16 DIAGNOSIS — Z5320 Procedure and treatment not carried out because of patient's decision for unspecified reasons: Secondary | ICD-10-CM | POA: Diagnosis present

## 2020-11-16 DIAGNOSIS — Z452 Encounter for adjustment and management of vascular access device: Secondary | ICD-10-CM

## 2020-11-16 DIAGNOSIS — M1990 Unspecified osteoarthritis, unspecified site: Secondary | ICD-10-CM | POA: Diagnosis present

## 2020-11-16 DIAGNOSIS — K7290 Hepatic failure, unspecified without coma: Secondary | ICD-10-CM | POA: Diagnosis present

## 2020-11-16 DIAGNOSIS — I130 Hypertensive heart and chronic kidney disease with heart failure and stage 1 through stage 4 chronic kidney disease, or unspecified chronic kidney disease: Secondary | ICD-10-CM | POA: Diagnosis present

## 2020-11-16 DIAGNOSIS — E872 Acidosis: Secondary | ICD-10-CM | POA: Diagnosis present

## 2020-11-16 DIAGNOSIS — Z8673 Personal history of transient ischemic attack (TIA), and cerebral infarction without residual deficits: Secondary | ICD-10-CM

## 2020-11-16 DIAGNOSIS — Z7901 Long term (current) use of anticoagulants: Secondary | ICD-10-CM

## 2020-11-16 DIAGNOSIS — J9601 Acute respiratory failure with hypoxia: Secondary | ICD-10-CM

## 2020-11-16 DIAGNOSIS — N390 Urinary tract infection, site not specified: Secondary | ICD-10-CM | POA: Diagnosis present

## 2020-11-16 DIAGNOSIS — G9340 Encephalopathy, unspecified: Secondary | ICD-10-CM

## 2020-11-16 DIAGNOSIS — N179 Acute kidney failure, unspecified: Secondary | ICD-10-CM

## 2020-11-16 DIAGNOSIS — J9602 Acute respiratory failure with hypercapnia: Secondary | ICD-10-CM

## 2020-11-16 DIAGNOSIS — Z7401 Bed confinement status: Secondary | ICD-10-CM

## 2020-11-16 LAB — I-STAT VENOUS BLOOD GAS, ED
Acid-Base Excess: 3 mmol/L — ABNORMAL HIGH (ref 0.0–2.0)
Bicarbonate: 34.2 mmol/L — ABNORMAL HIGH (ref 20.0–28.0)
Calcium, Ion: 1.15 mmol/L (ref 1.15–1.40)
HCT: 41 % (ref 36.0–46.0)
Hemoglobin: 13.9 g/dL (ref 12.0–15.0)
O2 Saturation: 66 %
Potassium: 5.7 mmol/L — ABNORMAL HIGH (ref 3.5–5.1)
Sodium: 138 mmol/L (ref 135–145)
TCO2: 37 mmol/L — ABNORMAL HIGH (ref 22–32)
pCO2, Ven: 88.4 mmHg (ref 44.0–60.0)
pH, Ven: 7.195 — CL (ref 7.250–7.430)
pO2, Ven: 44 mmHg (ref 32.0–45.0)

## 2020-11-16 LAB — I-STAT CHEM 8, ED
BUN: 28 mg/dL — ABNORMAL HIGH (ref 8–23)
Calcium, Ion: 1.09 mmol/L — ABNORMAL LOW (ref 1.15–1.40)
Chloride: 102 mmol/L (ref 98–111)
Creatinine, Ser: 2.3 mg/dL — ABNORMAL HIGH (ref 0.44–1.00)
Glucose, Bld: 134 mg/dL — ABNORMAL HIGH (ref 70–99)
HCT: 40 % (ref 36.0–46.0)
Hemoglobin: 13.6 g/dL (ref 12.0–15.0)
Potassium: 5.4 mmol/L — ABNORMAL HIGH (ref 3.5–5.1)
Sodium: 137 mmol/L (ref 135–145)
TCO2: 33 mmol/L — ABNORMAL HIGH (ref 22–32)

## 2020-11-16 LAB — CBG MONITORING, ED: Glucose-Capillary: 138 mg/dL — ABNORMAL HIGH (ref 70–99)

## 2020-11-16 LAB — COMPREHENSIVE METABOLIC PANEL
ALT: 16 U/L (ref 0–44)
AST: 15 U/L (ref 15–41)
Albumin: 3.4 g/dL — ABNORMAL LOW (ref 3.5–5.0)
Alkaline Phosphatase: 117 U/L (ref 38–126)
Anion gap: 11 (ref 5–15)
BUN: 23 mg/dL (ref 8–23)
CO2: 29 mmol/L (ref 22–32)
Calcium: 8.8 mg/dL — ABNORMAL LOW (ref 8.9–10.3)
Chloride: 99 mmol/L (ref 98–111)
Creatinine, Ser: 2.24 mg/dL — ABNORMAL HIGH (ref 0.44–1.00)
GFR, Estimated: 22 mL/min — ABNORMAL LOW (ref >60)
Glucose, Bld: 131 mg/dL — ABNORMAL HIGH (ref 70–99)
Potassium: 5.5 mmol/L — ABNORMAL HIGH (ref 3.5–5.1)
Sodium: 139 mmol/L (ref 135–145)
Total Bilirubin: 0.6 mg/dL (ref 0.3–1.2)
Total Protein: 7.3 g/dL (ref 6.5–8.1)

## 2020-11-16 LAB — APTT: aPTT: 38 seconds — ABNORMAL HIGH (ref 24–36)

## 2020-11-16 LAB — CBC
HCT: 41.2 % (ref 36.0–46.0)
Hemoglobin: 12.5 g/dL (ref 12.0–15.0)
MCH: 32.2 pg (ref 26.0–34.0)
MCHC: 30.3 g/dL (ref 30.0–36.0)
MCV: 106.2 fL — ABNORMAL HIGH (ref 80.0–100.0)
Platelets: 171 10*3/uL (ref 150–400)
RBC: 3.88 MIL/uL (ref 3.87–5.11)
RDW: 13.3 % (ref 11.5–15.5)
WBC: 7.8 10*3/uL (ref 4.0–10.5)
nRBC: 0.4 % — ABNORMAL HIGH (ref 0.0–0.2)

## 2020-11-16 LAB — DIFFERENTIAL
Abs Immature Granulocytes: 0.18 10*3/uL — ABNORMAL HIGH (ref 0.00–0.07)
Basophils Absolute: 0 10*3/uL (ref 0.0–0.1)
Basophils Relative: 1 %
Eosinophils Absolute: 0 10*3/uL (ref 0.0–0.5)
Eosinophils Relative: 0 %
Immature Granulocytes: 2 %
Lymphocytes Relative: 15 %
Lymphs Abs: 1.2 10*3/uL (ref 0.7–4.0)
Monocytes Absolute: 0.5 10*3/uL (ref 0.1–1.0)
Monocytes Relative: 6 %
Neutro Abs: 5.9 10*3/uL (ref 1.7–7.7)
Neutrophils Relative %: 76 %

## 2020-11-16 LAB — PROTIME-INR
INR: 1.5 — ABNORMAL HIGH (ref 0.8–1.2)
Prothrombin Time: 18.4 seconds — ABNORMAL HIGH (ref 11.4–15.2)

## 2020-11-16 MED ORDER — FUROSEMIDE 10 MG/ML IJ SOLN
80.0000 mg | Freq: Once | INTRAMUSCULAR | Status: AC
  Administered 2020-11-17: 80 mg via INTRAVENOUS
  Filled 2020-11-16: qty 8

## 2020-11-16 MED ORDER — IOHEXOL 350 MG/ML SOLN
75.0000 mL | Freq: Once | INTRAVENOUS | Status: AC | PRN
  Administered 2020-11-16: 75 mL via INTRAVENOUS

## 2020-11-16 MED ORDER — SODIUM CHLORIDE 0.9% FLUSH
3.0000 mL | Freq: Once | INTRAVENOUS | Status: DC
Start: 2020-11-16 — End: 2020-11-23

## 2020-11-16 NOTE — ED Notes (Signed)
PLEASE CALL DAUGHTER TONIA [phone].

## 2020-11-16 NOTE — Code Documentation (Signed)
Stroke Response Nurse Documentation Code Documentation  Jean Stewart is a 75 y.o. female arriving to [facility] ED via [facility] on 9/12 with past medical hx of afib, HTN, Morbid Obesity, OSA/OHS, CHF, CKD. On Warfarin but unknown if taking as prescribed. Code stroke was activated by EMS.   Patient from [facility] where she was LKW at 1400 and now complaining of aphasia, left gaze with global weakness.  Stroke team at the bedside on patient arrival. Labs drawn and patient cleared for CT by Dr. Amedeo Plenty. Patient to CT with team. NIHSS 25, see documentation for details and code stroke times. Patient with decreased LOC, disoriented, not following commands, left gaze preference , right hemianopia, bilateral arm weakness, bilateral leg weakness, left decreased sensation, Global aphasia , dysarthria , and Visual  neglect on exam. The following imaging was completed:  CT, CTA head and neck. Patient is not a candidate for IV Thrombolytic due to out of window. Patient is not a candidate for IR due to NO LVO.   Bedside handoff with ED RN Jinny Blossom.    Madelynn Done  Rapid Response RN

## 2020-11-16 NOTE — ED Triage Notes (Signed)
Pt brought from [facility] for code stroke. Baseline A&O x3, changes noticed at 1400 that they believed was r/t sepsis. Treated for sepsis before code stroke activation and transfer to [facility]. Pt hx COPD, normally on room air and currently requiring supplemental oxygen to maintain 85-90% oxygen saturation. EMS noted leftward gaze, deviation, and eventual staring off. No stroke history.   EMS vitals CBG 122 144/90 91% 5L nasal cannula

## 2020-11-16 NOTE — ED Notes (Signed)
RN attempted to obtain cultures unsuccessfully- EDP aware

## 2020-11-16 NOTE — ED Provider Notes (Signed)
[facility] EMERGENCY DEPARTMENT Provider Note   CSN: [id number] Arrival date & time: 11/16/20  2109  An emergency department physician performed an initial assessment on this suspected stroke patient at 2112.  History No chief complaint on file.   Jean Stewart is a 75 y.o. female with PMHx HTN, Afib on Eliquis, CKD, CHF, COPD who presents for evaluation of altered mental status. HPI is limited by AMS. EMS and SNF staff provide collateral history.  Patient is reportedly alert, oriented and talkative at baseline. She was being treated for UTI over the past 2 days. At approximately 1400 today, the patient was noted to be altered. EMS stated that the SNF was initially concerned about sepsis and initiated antibiotics, prednisone, and additional medications. Over the course of the afternoon, the patient developed an increasing oxygen requirement up to 5L. SNF staff noted leftward gaze deviation and left-sided weakness, as well as failure to follow commands. EMS was called, and she was transported to our ED for further care.        Past Medical History:  Diagnosis Date   A-fib [facility])    Arthritis    COPD (chronic obstructive pulmonary disease) ([place])    Hypertension    IBS (irritable bowel syndrome)    Liver failure (HCC)    Morbid obesity (HCC)    Neuropathy    Peripheral vascular disease (HCC)    blood clots in legs   Physical deconditioning    Pre-diabetes    Suspected sleep apnea     Patient Active Problem List   Diagnosis Date Noted   Type III open trimalleolar fracture of left ankle 01/22/2019   Open fracture dislocation of ankle 12/04/2018   Type III open trimalleolar fracture of ankle 12/04/2018   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[place]) 12/04/2018   Chronic anticoagulation 12/04/2018   Anemia of chronic disease 12/04/2018   Pressure injury of skin 05/12/2018   Atrial fibrillation with RVR ([place]) 05/11/2018   Obesity, Class III, BMI  40-49.9 (morbid obesity) ([place]) 05/11/2018   Essential hypertension 05/11/2018   Renal insufficiency 05/11/2018   Permanent atrial fibrillation ([place]) 05/11/2018   Acute respiratory failure with hypoxia and hypercapnia ([place]) 05/11/2018   Elevated troponin 05/11/2018    Past Surgical History:  Procedure Laterality Date   EXTERNAL FIXATION LEG Left 12/03/2018   Procedure: EXTERNAL FIXATION LEG;  Surgeon: Shona Needles, MD;  Location: [facility];  Service: Orthopedics;  Laterality: Left;   EXTERNAL FIXATION REMOVAL Left 01/25/2019   Procedure: REMOVAL EXTERNAL FIXATION LEG;  Surgeon: Shona Needles, MD;  Location: [place];  Service: Orthopedics;  Laterality: Left;   I & D EXTREMITY Left 12/03/2018   Procedure: IRRIGATION AND DEBRIDEMENT EXTREMITY;  Surgeon: Shona Needles, MD;  Location: [facility];  Service: Orthopedics;  Laterality: Left;   ORIF ANKLE FRACTURE Left 12/03/2018   Procedure: OPEN REDUCTION INTERNAL FIXATION (ORIF) ANKLE FRACTURE;  Surgeon: Shona Needles, MD;  Location: [facility];  Service: Orthopedics;  Laterality: Left;     OB History   No obstetric history on file.     Family History  Problem Relation Age of Onset   CAD Mother        MI at age 7    Social History   Tobacco Use   Smoking status: Never   Smokeless tobacco: Never  Substance Use Topics   Alcohol use: Not Currently   Drug use: Never    Home Medications Prior to Admission medications  Medication Sig Start Date End Date Taking? Authorizing Provider  furosemide (LASIX) 40 MG tablet Take 1 tablet (40 mg total) by mouth 2 (two) times daily. Patient not taking: Reported on 01/24/2019 05/19/18   Dessa Phi, DO  gabapentin (NEURONTIN) 100 MG capsule Take 1 capsule (100 mg total) by mouth 3 (three) times daily. Patient taking differently: Take 200 mg by mouth 3 (three) times daily.  12/10/18   Harold Hedge, MD  hydrocortisone 2.5 % cream Apply 1 application topically 2 (two) times daily as needed for itching.  01/20/18   [provider]  metoprolol succinate (TOPROL-XL) 100 MG 24 hr tablet Take 1 tablet (100 mg total) by mouth daily. Take with or immediately following a meal. 05/20/18   Dessa Phi, DO  traMADol (ULTRAM) 50 MG tablet Take 1 tablet (50 mg total) by mouth every 12 (twelve) hours as needed. Patient not taking: Reported on 06/05/2019 01/25/19   Delray Alt, PA-C  Vitamin D, Ergocalciferol, (DRISDOL) 1.25 MG (50000 UT) CAPS capsule Take 1 capsule (50,000 Units total) by mouth every 7 (seven) days. Patient not taking: Reported on 06/05/2019 12/11/18   Harold Hedge, MD  warfarin (COUMADIN) 5 MG tablet Take 1 tablet (5 mg total) by mouth daily. 05/19/18   Dessa Phi, DO    Allergies    Patient has no known allergies.  Review of Systems   Review of Systems  Unable to perform ROS: Mental status change   Physical Exam Updated Vital Signs BP 100/84   Pulse 81   Temp 98.4 F (36.9 C) (Axillary)   Resp 16   SpO2 100%   Physical Exam Vitals and nursing note reviewed.  Constitutional:      Appearance: She is well-developed. She is obese. She is ill-appearing.  HENT:     Head: Normocephalic and atraumatic.  Eyes:     Conjunctiva/sclera: Conjunctivae normal.  Cardiovascular:     Rate and Rhythm: Regular rhythm. Tachycardia present.     Heart sounds: No murmur heard. Pulmonary:     Effort: Pulmonary effort is normal. No respiratory distress.     Breath sounds: Normal breath sounds.  Abdominal:     Palpations: Abdomen is soft.     Tenderness: There is no abdominal tenderness.  Musculoskeletal:        General: Normal range of motion.     Cervical back: Neck supple.  Skin:    General: Skin is warm and dry.     Capillary Refill: Capillary refill takes less than 2 seconds.  Neurological:     Mental Status: She is alert. She is disoriented.     GCS: GCS eye subscore is 4. GCS verbal subscore is 1. GCS motor subscore is 5.     Comments: Leftward gaze preference,  right neglect, diffuse weakness in all extremities, localizes in all extremities, altered and not attentive to examiner, no facial droop noted.     ED Results / Procedures / Treatments   Labs (all labs ordered are listed, but only abnormal results are displayed) Labs Reviewed  PROTIME-INR - Abnormal; Notable for the following components:      Result Value   Prothrombin Time 18.4 (*)    INR 1.5 (*)    All other components within normal limits  APTT - Abnormal; Notable for the following components:   aPTT 38 (*)    All other components within normal limits  CBC - Abnormal; Notable for the following components:   MCV 106.2 (*)  nRBC 0.4 (*)    All other components within normal limits  DIFFERENTIAL - Abnormal; Notable for the following components:   Abs Immature Granulocytes 0.18 (*)    All other components within normal limits  COMPREHENSIVE METABOLIC PANEL - Abnormal; Notable for the following components:   Potassium 5.5 (*)    Glucose, Bld 131 (*)    Creatinine, Ser 2.24 (*)    Calcium 8.8 (*)    Albumin 3.4 (*)    GFR, Estimated 22 (*)    All other components within normal limits  I-STAT CHEM 8, ED - Abnormal; Notable for the following components:   Potassium 5.4 (*)    BUN 28 (*)    Creatinine, Ser 2.30 (*)    Glucose, Bld 134 (*)    Calcium, Ion 1.09 (*)    TCO2 33 (*)    All other components within normal limits  CBG MONITORING, ED - Abnormal; Notable for the following components:   Glucose-Capillary 138 (*)    All other components within normal limits  I-STAT VENOUS BLOOD GAS, ED - Abnormal; Notable for the following components:   pH, Ven 7.195 (*)    pCO2, Ven 88.4 (*)    Bicarbonate 34.2 (*)    TCO2 37 (*)    Acid-Base Excess 3.0 (*)    Potassium 5.7 (*)    All other components within normal limits  RESP PANEL BY RT-PCR (FLU A&B, COVID) ARPGX2  URINE CULTURE  CULTURE, BLOOD (ROUTINE X 2)  CULTURE, BLOOD (ROUTINE X 2)  LACTIC ACID, PLASMA  LACTIC ACID,  PLASMA  URINALYSIS, ROUTINE W REFLEX MICROSCOPIC  VITAMIN B12  VITAMIN B1  AMMONIA  TSH  METHYLMALONIC ACID, SERUM  HEMOGLOBIN A1C  LIPID PANEL  BRAIN NATRIURETIC PEPTIDE  I-STAT VENOUS BLOOD GAS, ED    EKG EKG Interpretation  Date/Time:  Monday November 16 2020 22:07:48 EDT Ventricular Rate:  99 PR Interval:    QRS Duration: 115 QT Interval:  381 QTC Calculation: 467 R Axis:   -19 Text Interpretation: Atrial fibrillation Nonspecific intraventricular conduction delay Low voltage, extremity and precordial leads Confirmed by Ripley Fraise [phone]) on 11/16/2020 11:15:35 PM  Radiology DG Chest Port 1 View  Result Date: 11/16/2020 CLINICAL DATA:  Code stroke, possible sepsis, hypoxia EXAM: PORTABLE CHEST 1 VIEW COMPARISON:  12/03/2018 FINDINGS: Cardiomegaly with pulmonary vascular congestion and mild interstitial edema. Suspected small bilateral pleural effusions. No pneumothorax. IMPRESSION: Cardiomegaly with mild interstitial edema and suspected small bilateral pleural effusions. Electronically Signed   By: Julian Hy M.D.   On: 11/16/2020 22:25   CT HEAD CODE STROKE WO CONTRAST  Result Date: 11/16/2020 CLINICAL DATA:  Code stroke.  Left-sided weakness EXAM: CT HEAD WITHOUT CONTRAST TECHNIQUE: Contiguous axial images were obtained from the base of the skull through the vertex without intravenous contrast. COMPARISON:  None. FINDINGS: Brain: There is no mass, hemorrhage or extra-axial collection. The size and configuration of the ventricles and extra-axial CSF spaces are normal. The brain parenchyma is normal, without evidence of acute or chronic infarction. Partially empty sella turcica. Vascular: No abnormal hyperdensity of the major intracranial arteries or dural venous sinuses. No intracranial atherosclerosis. Skull: The visualized skull base, calvarium and extracranial soft tissues are normal. Sinuses/Orbits: No fluid levels or advanced mucosal thickening of the visualized  paranasal sinuses. No mastoid or middle ear effusion. The orbits are normal. ASPECTS [facility] Stroke Program Early CT Score) - Ganglionic level infarction (caudate, lentiform nuclei, internal capsule, insula, M1-M3 cortex): 7 - Supraganglionic infarction (  M4-M6 cortex): 3 Total score (0-10 with 10 being normal): 10 IMPRESSION: 1. No acute intracranial abnormality. 2. ASPECTS is 10. These results were communicated to Dr. Lesleigh Noe at 9:25 pm on 11/16/2020 by text page via the [facility] messaging system. Electronically Signed   By: Ulyses Jarred M.D.   On: 11/16/2020 21:35   CT ANGIO HEAD NECK W WO CM (CODE STROKE)  Result Date: 11/16/2020 CLINICAL DATA:  Left-sided weakness EXAM: CT ANGIOGRAPHY HEAD AND NECK TECHNIQUE: Multidetector CT imaging of the head and neck was performed using the standard protocol during bolus administration of intravenous contrast. Multiplanar CT image reconstructions and MIPs were obtained to evaluate the vascular anatomy. Carotid stenosis measurements (when applicable) are obtained utilizing NASCET criteria, using the distal internal carotid diameter as the denominator. CONTRAST:  83m OMNIPAQUE IOHEXOL 350 MG/ML SOLN COMPARISON:  None. FINDINGS: CTA NECK FINDINGS SKELETON: There is no bony spinal canal stenosis. No lytic or blastic lesion. OTHER NECK: Normal pharynx, larynx and major salivary glands. No cervical lymphadenopathy. Enlarged heterogeneous thyroid gland. UPPER CHEST: No pneumothorax or pleural effusion. No nodules or masses. AORTIC ARCH: There is calcific atherosclerosis of the aortic arch. There is no aneurysm, dissection or hemodynamically significant stenosis of the visualized portion of the aorta. Conventional 3 vessel aortic branching pattern. The visualized proximal subclavian arteries are widely patent. RIGHT CAROTID SYSTEM: Normal without aneurysm, dissection or stenosis. LEFT CAROTID SYSTEM: Normal without aneurysm, dissection or stenosis. VERTEBRAL ARTERIES: Left  dominant configuration. Both origins are clearly patent. There is no dissection, occlusion or flow-limiting stenosis to the skull base (V1-V3 segments). CTA HEAD FINDINGS POSTERIOR CIRCULATION: --Vertebral arteries: Normal V4 segments. --Inferior cerebellar arteries: Normal. --Basilar artery: Normal. --Superior cerebellar arteries: Normal. --Posterior cerebral arteries (PCA): Normal. ANTERIOR CIRCULATION: --Intracranial internal carotid arteries: Normal. --Anterior cerebral arteries (ACA): Normal. Both A1 segments are present. Patent anterior communicating artery (a-comm). --Middle cerebral arteries (MCA): Normal. VENOUS SINUSES: As permitted by contrast timing, patent. ANATOMIC VARIANTS: None Review of the MIP images confirms the above findings. IMPRESSION: 1. No emergent large vessel occlusion or high-grade stenosis of the intracranial or cervical arteries. 2. Enlarged, heterogeneous thyroid gland. Recommend thyroid ultrasound (ref: J Am Coll Radiol. 2015 Feb;12(2): 143-50). Aortic Atherosclerosis (ICD10-I70.0). These results were called by telephone at the time of interpretation on 11/16/2020 at 9:48 pm [facility] who verbally acknowledged these results. Electronically Signed   By: KUlyses JarredM.D.   On: 11/16/2020 21:48    Procedures Procedures   Medications Ordered in ED Medications  sodium chloride flush (NS) 0.9 % injection 3 mL (3 mLs Intravenous Not Given 11/16/20 2214)  iohexol (OMNIPAQUE) 350 MG/ML injection 75 mL (75 mLs Intravenous Contrast Given 11/16/20 2140)    ED Course  I have reviewed the triage vital signs and the nursing notes.  Pertinent labs & imaging results that were available during my care of the patient were reviewed by me and considered in my medical decision making (see chart for details).    MDM Rules/Calculators/A&P                           75y.o. female with past medical history as above who presents for evaluation of altered mental status.  Afebrile and hemodynamically stable.  Exam as detailed above. CODE stroke was activated on this patient on arrival. Neurology was presented and assisted in evaluation. CT code stroke without LVO. She is not a candidate for thrombolytics given Eliquis,  and no indication for endovascular intervention given no LVO. Neurology feels that they cannot entirely exclude stroke and will recommend full stroke/TIA workup. No leukocytosis, left shift, or anemia.  Mildly hyperkalemic with potassium 5.5 and AKI with Creatinine 2.2, concerning for acute renal failure.  VBG with pH 7.19 and pCO2 88. Presentation consistent with AMS secondary to hypercapnia, likely due to obesity versus CHF vs COPD. Patient was treated with BiPAP and Lasix. Repeat VBG pending. Discussed with hospitalist, who accepted patient. Patient was handed off to oncoming ED provider. See their note for further details.   Final Clinical Impression(s) / ED Diagnoses Final diagnoses:  Acute respiratory failure with hypercapnia [facility])    Rx / DC Orders ED Discharge Orders     None        Violet Baldy, MD 11/18/20 1134    Elnora Morrison, MD 11/19/20 1205

## 2020-11-16 NOTE — Consult Note (Addendum)
Neurology Consultation Reason for Consult: Code stroke  Requesting Physician: Elnora Morrison  CC: Shortness of breath, altered mental status   History is obtained from: EMS, chart review and family and records from facility   HPI: Jean Stewart is a 75 y.o. female with a PMHx significant for Afib on Eliquis, HTN, HFrEF (40-45% in 05/2018, w/ mod to severe TR and MR and RV reduced function), morbid obesity (BMI > 40), HTN, Pre-DM, possible OSA, COPD, reported liver failure, PVD.   Since an ankle fracture in late 2020 she has been nearly bedbound though per daughter [name] she does still ambulate with a walker and use a wheelchair to participate in activities at her facility.  [name] also reports that at baseline her mother is fully oriented and converses normally, and last spoke to her at 1 PM.  Reportedly per facility around 2 PM the patient began to be altered, they were initially concerned for sepsis and reportedly gave her prednisone and some other medications.  She was not improving EMS was activated.  On their evaluation there was concern for left-sided gaze deviation as well as left-sided weakness.  Patient was not following commands and not conversant.  Code stroke was activated for focal deficits there there was some significant delay in transfer due to her body habitus, which also limited our ability to weigh her per standard protocols as the larged stretcher did not fit on our scale.  Daughter additionally reports that the patient has been being treated for UTI for the past 2 days with an antibiotic but otherwise has been in her normal state of health   LKW: 1 PM on 9/11 tPA given?: No, on Eliquis and out of the window IA performed?: No, no LVO Premorbid modified rankin scale:      4 - Moderately severe disability. Unable to attend to own bodily needs without assistance, and unable to walk unassisted.     5 - Severe disability. Requires constant nursing care and attention, bedridden,  incontinent.  ROS: Unable to obtain due to altered mental status.   Past Medical History:  Diagnosis Date   A-fib ([place])    Arthritis    COPD (chronic obstructive pulmonary disease) (H[facility])    Hypertension    IBS (irritable bowel syndrome)    Liver failure (HCC)    Morbid obesity (H[facility])    Neuropathy    Peripheral vascular disease (H[facility])    blood clots in legs   Physical deconditioning    Pre-diabetes    Suspected sleep apnea    Family History  Problem Relation Age of Onset   CAD Mother        MI at age 75    Social History:  reports that she has never smoked. She has never used smokeless tobacco. She reports that she does not currently use alcohol. She reports that she does not use drugs.   Exam: Current vital signs: There were no vitals taken for this visit. Vital signs in last 24 hours:     Physical Exam  Constitutional: Appears chronically ill, morbidly obese Psych: Affect appropriate to situation, minimally interactive, irritable with noxious stimulation Eyes: No scleral injection HENT: No oropharyngeal obstruction.  MSK: no joint deformities.  Cardiovascular: Intermittently irregular on the monitor, sinus rhythm per EMS Respiratory: Tachypneic with a new or increased oxygen requirement GI: Soft.  No distension. There is no tenderness.  Skin: Warm dry and intact visible skin  Neuro: Mental Status: Patient is sleepy requiring repeated noxious stimulation to  open her eyes, nonverbal and not following any commands Cranial Nerves: II: Visual Fields are notable for likely a right field cut with blink to threat. Pupils are equal, round, and reactive to light.   III,IV, VI: EOMI with a gaze preference to the left V: Facial sensation is symmetric to eyelash brush VII: Facial movement is symmetric grimace.  VIII: hearing is intact to voice (orients to examiner on the left side only) Remainder unable to obtain secondary to mental status Motor: Tone is normal. Bulk is  normal.  Localizes to noxious stimulation with bilateral upper extremities.  Withdraws in the bilateral lower extremities but the right lower extremity is less brisk than the left Sensory: Grimaces equally to noxious stim in all 4 extremities Cerebellar: Unable to assess given mental status  NIHSS total 25 Score breakdown: 2 points for drowsiness, 2 points for not answering questions, 2 points for not following commands, one-point for gaze preference to the left, 2 points for likely right field cut based on blink to threat, 2 points for left arm weakness, 2 points for right arm weakness, 3 points for left leg weakness, 3 points for right leg weakness, 3 points for being mute, 2 points for severe dysarthria, one-point for some right-sided neglect    I have reviewed labs in epic and the results pertinent to this consultation are: Creatinine 2.3 up from a baseline of 1.12 in March 2021 Glucose 138 CBC with new macrocytosis with MCV of 106.2, otherwise hemoglobin and platelets are stable and within normal limits though there is an elevated nucleated red blood cell level 0.4  I have reviewed the images obtained: Head CT negative for acute intracranial process, personally reviewed CTA negative for large vessel occlusion, personally reviewed and confirmed with radiology  CT perfusion not performed given AKI and no LVO  Impression: This is a 75 year old woman with stroke risk factors as detailed above presenting with altered mental status in the setting of recent UTI, starting antibiotics, AKI on CKD and shortness of breath with significant chronic lung disease.  She is not a candidate for thrombolytics given she is on Eliquis, and not a candidate for intravascular intervention given lack of a large vessel occlusion.  Cannot rule out stroke definitively, though suspect there is some toxic/metabolic component to her encephalopathy.  Given the focality of her findings (left gaze preference, reduced blink  to threat on the right), I do recommend a repeat head CT tomorrow to evaluate for evolving process, as well as stroke/TIA work-up for risk factor optimization. From a neurological perspective given that initial head CT is negative and she is greater than 6 hours from symptom onset, risks of continuing Eliquis are outweighed by the benefits and this medication should be continued.  Additionally there is a possibility of focal seizures contributing to her symptoms given the focality although no clear seizure activity was reported, I will obtain a routine EEG for further evaluation  Recommendations: #Possible stroke - Stroke labs HgbA1c, fasting lipid panel - MRI brain would be ideal but likely cannot be obtained given her girth/weight, therefore repeat head CT 10 AM on 9/13  (both ordered) - Frequent neuro checks - Echocardiogram - Please continue Eliquis from a neurological perspective unless there is significant other contraindication - Risk factor modification, diet, exercise and weight loss counseling when patient's mental status improves - Telemetry monitoring - Blood pressure goal   - Permissive hypertension to 220/120 - PT consult, OT consult, Speech consult - EEG, routine - Neurology to  follow  # Encephalopathy -B12, MMA, thiamine, ammonia, TSH  Appreciate management of respiratory status, infectious work-up, AKI management and other toxic/metabolic issues per ED/primary team   Lesleigh Noe MD-PhD [facility] [phone] Available 7 PM to 7 AM, outside of these hours please call Neurologist on call as listed on Amion.  Total critical care time: 60 minutes   Critical care time was exclusive of separately billable procedures and treating other patients.   Critical care was necessary to treat or prevent imminent or life-threatening deterioration.   Critical care was time spent personally by me on the following activities: development of treatment plan with patient  and/or surrogate as well as nursing, discussions with consultants/primary team, evaluation of patient's response to treatment, examination of patient, obtaining history from patient or surrogate, ordering and performing treatments and interventions, ordering and review of laboratory studies, ordering and review of radiographic studies, and re-evaluation of patient's condition as needed, as documented above.

## 2020-11-17 ENCOUNTER — Inpatient Hospital Stay (HOSPITAL_COMMUNITY): Payer: Medicare Other

## 2020-11-17 DIAGNOSIS — Y849 Medical procedure, unspecified as the cause of abnormal reaction of the patient, or of later complication, without mention of misadventure at the time of the procedure: Secondary | ICD-10-CM | POA: Diagnosis not present

## 2020-11-17 DIAGNOSIS — I480 Paroxysmal atrial fibrillation: Secondary | ICD-10-CM | POA: Diagnosis not present

## 2020-11-17 DIAGNOSIS — J9621 Acute and chronic respiratory failure with hypoxia: Principal | ICD-10-CM | POA: Diagnosis present

## 2020-11-17 DIAGNOSIS — Z6841 Body Mass Index (BMI) 40.0 and over, adult: Secondary | ICD-10-CM | POA: Diagnosis not present

## 2020-11-17 DIAGNOSIS — Z7901 Long term (current) use of anticoagulants: Secondary | ICD-10-CM | POA: Diagnosis not present

## 2020-11-17 DIAGNOSIS — I272 Pulmonary hypertension, unspecified: Secondary | ICD-10-CM | POA: Diagnosis present

## 2020-11-17 DIAGNOSIS — I4891 Unspecified atrial fibrillation: Secondary | ICD-10-CM | POA: Diagnosis not present

## 2020-11-17 DIAGNOSIS — R4182 Altered mental status, unspecified: Secondary | ICD-10-CM | POA: Diagnosis not present

## 2020-11-17 DIAGNOSIS — E872 Acidosis: Secondary | ICD-10-CM | POA: Diagnosis present

## 2020-11-17 DIAGNOSIS — I739 Peripheral vascular disease, unspecified: Secondary | ICD-10-CM | POA: Diagnosis present

## 2020-11-17 DIAGNOSIS — G9340 Encephalopathy, unspecified: Secondary | ICD-10-CM | POA: Diagnosis not present

## 2020-11-17 DIAGNOSIS — I130 Hypertensive heart and chronic kidney disease with heart failure and stage 1 through stage 4 chronic kidney disease, or unspecified chronic kidney disease: Secondary | ICD-10-CM | POA: Diagnosis present

## 2020-11-17 DIAGNOSIS — Z8249 Family history of ischemic heart disease and other diseases of the circulatory system: Secondary | ICD-10-CM | POA: Diagnosis not present

## 2020-11-17 DIAGNOSIS — N179 Acute kidney failure, unspecified: Secondary | ICD-10-CM | POA: Diagnosis present

## 2020-11-17 DIAGNOSIS — R0902 Hypoxemia: Secondary | ICD-10-CM

## 2020-11-17 DIAGNOSIS — J9622 Acute and chronic respiratory failure with hypercapnia: Secondary | ICD-10-CM | POA: Insufficient documentation

## 2020-11-17 DIAGNOSIS — N390 Urinary tract infection, site not specified: Secondary | ICD-10-CM | POA: Diagnosis present

## 2020-11-17 DIAGNOSIS — Z20822 Contact with and (suspected) exposure to covid-19: Secondary | ICD-10-CM | POA: Diagnosis present

## 2020-11-17 DIAGNOSIS — J9602 Acute respiratory failure with hypercapnia: Secondary | ICD-10-CM | POA: Diagnosis not present

## 2020-11-17 DIAGNOSIS — Z5320 Procedure and treatment not carried out because of patient's decision for unspecified reasons: Secondary | ICD-10-CM | POA: Diagnosis present

## 2020-11-17 DIAGNOSIS — E875 Hyperkalemia: Secondary | ICD-10-CM | POA: Diagnosis present

## 2020-11-17 DIAGNOSIS — T886XXA Anaphylactic reaction due to adverse effect of correct drug or medicament properly administered, initial encounter: Secondary | ICD-10-CM | POA: Diagnosis not present

## 2020-11-17 DIAGNOSIS — E662 Morbid (severe) obesity with alveolar hypoventilation: Secondary | ICD-10-CM | POA: Diagnosis present

## 2020-11-17 DIAGNOSIS — K7290 Hepatic failure, unspecified without coma: Secondary | ICD-10-CM | POA: Diagnosis present

## 2020-11-17 DIAGNOSIS — N1831 Chronic kidney disease, stage 3a: Secondary | ICD-10-CM | POA: Diagnosis present

## 2020-11-17 DIAGNOSIS — I5043 Acute on chronic combined systolic (congestive) and diastolic (congestive) heart failure: Secondary | ICD-10-CM | POA: Diagnosis present

## 2020-11-17 DIAGNOSIS — T4275XA Adverse effect of unspecified antiepileptic and sedative-hypnotic drugs, initial encounter: Secondary | ICD-10-CM | POA: Diagnosis not present

## 2020-11-17 DIAGNOSIS — J449 Chronic obstructive pulmonary disease, unspecified: Secondary | ICD-10-CM | POA: Diagnosis present

## 2020-11-17 DIAGNOSIS — Z8673 Personal history of transient ischemic attack (TIA), and cerebral infarction without residual deficits: Secondary | ICD-10-CM | POA: Diagnosis not present

## 2020-11-17 DIAGNOSIS — Z7401 Bed confinement status: Secondary | ICD-10-CM | POA: Diagnosis not present

## 2020-11-17 HISTORY — DX: Hypoxemia: R09.02

## 2020-11-17 HISTORY — DX: Acute and chronic respiratory failure with hypoxia: J96.21

## 2020-11-17 LAB — BASIC METABOLIC PANEL
Anion gap: 12 (ref 5–15)
Anion gap: 10 (ref 5–15)
BUN: 28 mg/dL — ABNORMAL HIGH (ref 8–23)
BUN: 28 mg/dL — ABNORMAL HIGH (ref 8–23)
CO2: 29 mmol/L (ref 22–32)
CO2: 28 mmol/L (ref 22–32)
Calcium: 9 mg/dL (ref 8.9–10.3)
Calcium: 8.5 mg/dL — ABNORMAL LOW (ref 8.9–10.3)
Chloride: 97 mmol/L — ABNORMAL LOW (ref 98–111)
Chloride: 99 mmol/L (ref 98–111)
Creatinine, Ser: 2.41 mg/dL — ABNORMAL HIGH (ref 0.44–1.00)
Creatinine, Ser: 2.24 mg/dL — ABNORMAL HIGH (ref 0.44–1.00)
GFR, Estimated: 20 mL/min — ABNORMAL LOW (ref >60)
GFR, Estimated: 22 mL/min — ABNORMAL LOW (ref >60)
Glucose, Bld: 117 mg/dL — ABNORMAL HIGH (ref 70–99)
Glucose, Bld: 110 mg/dL — ABNORMAL HIGH (ref 70–99)
Potassium: 5.5 mmol/L — ABNORMAL HIGH (ref 3.5–5.1)
Potassium: 4.2 mmol/L (ref 3.5–5.1)
Sodium: 138 mmol/L (ref 135–145)
Sodium: 137 mmol/L (ref 135–145)

## 2020-11-17 LAB — LIPID PANEL
Cholesterol: 150 mg/dL (ref 0–200)
HDL: 39 mg/dL — ABNORMAL LOW (ref >40)
LDL Cholesterol: 101 mg/dL — ABNORMAL HIGH (ref 0–99)
Total CHOL/HDL Ratio: 3.8 RATIO
Triglycerides: 50 mg/dL (ref <150)
VLDL: 10 mg/dL (ref 0–40)

## 2020-11-17 LAB — GLUCOSE, CAPILLARY
Glucose-Capillary: 106 mg/dL — ABNORMAL HIGH (ref 70–99)
Glucose-Capillary: 106 mg/dL — ABNORMAL HIGH (ref 70–99)
Glucose-Capillary: 112 mg/dL — ABNORMAL HIGH (ref 70–99)
Glucose-Capillary: 117 mg/dL — ABNORMAL HIGH (ref 70–99)

## 2020-11-17 LAB — LACTIC ACID, PLASMA
Lactic Acid, Venous: 0.8 mmol/L (ref 0.5–1.9)
Lactic Acid, Venous: 3.2 mmol/L (ref 0.5–1.9)

## 2020-11-17 LAB — URINALYSIS, ROUTINE W REFLEX MICROSCOPIC
Bilirubin Urine: NEGATIVE
Glucose, UA: NEGATIVE mg/dL
Hgb urine dipstick: NEGATIVE
Ketones, ur: NEGATIVE mg/dL
Leukocytes,Ua: NEGATIVE
Nitrite: NEGATIVE
Protein, ur: NEGATIVE mg/dL
Specific Gravity, Urine: 1.012 (ref 1.005–1.030)
pH: 5 (ref 5.0–8.0)

## 2020-11-17 LAB — CBC
HCT: 40.4 % (ref 36.0–46.0)
Hemoglobin: 12.7 g/dL (ref 12.0–15.0)
MCH: 32.4 pg (ref 26.0–34.0)
MCHC: 31.4 g/dL (ref 30.0–36.0)
MCV: 103.1 fL — ABNORMAL HIGH (ref 80.0–100.0)
Platelets: 139 10*3/uL — ABNORMAL LOW (ref 150–400)
RBC: 3.92 MIL/uL (ref 3.87–5.11)
RDW: 13.4 % (ref 11.5–15.5)
WBC: 10 10*3/uL (ref 4.0–10.5)
nRBC: 0.4 % — ABNORMAL HIGH (ref 0.0–0.2)

## 2020-11-17 LAB — I-STAT ARTERIAL BLOOD GAS, ED
Acid-Base Excess: 4 mmol/L — ABNORMAL HIGH (ref 0.0–2.0)
Acid-Base Excess: 7 mmol/L — ABNORMAL HIGH (ref 0.0–2.0)
Bicarbonate: 32.9 mmol/L — ABNORMAL HIGH (ref 20.0–28.0)
Bicarbonate: 36.1 mmol/L — ABNORMAL HIGH (ref 20.0–28.0)
Calcium, Ion: 1.16 mmol/L (ref 1.15–1.40)
Calcium, Ion: 1.25 mmol/L (ref 1.15–1.40)
HCT: 39 % (ref 36.0–46.0)
HCT: 40 % (ref 36.0–46.0)
Hemoglobin: 13.3 g/dL (ref 12.0–15.0)
Hemoglobin: 13.6 g/dL (ref 12.0–15.0)
O2 Saturation: 100 %
O2 Saturation: 96 %
Patient temperature: 98.6
Patient temperature: 98.6
Potassium: 5.5 mmol/L — ABNORMAL HIGH (ref 3.5–5.1)
Potassium: 5.5 mmol/L — ABNORMAL HIGH (ref 3.5–5.1)
Sodium: 137 mmol/L (ref 135–145)
Sodium: 139 mmol/L (ref 135–145)
TCO2: 34 mmol/L — ABNORMAL HIGH (ref 22–32)
TCO2: 39 mmol/L — ABNORMAL HIGH (ref 22–32)
pCO2 arterial: 51.5 mmHg — ABNORMAL HIGH (ref 32.0–48.0)
pCO2 arterial: 99.2 mmHg (ref 32.0–48.0)
pH, Arterial: 7.169 — CL (ref 7.350–7.450)
pH, Arterial: 7.414 (ref 7.350–7.450)
pO2, Arterial: 111 mmHg — ABNORMAL HIGH (ref 83.0–108.0)
pO2, Arterial: 338 mmHg — ABNORMAL HIGH (ref 83.0–108.0)

## 2020-11-17 LAB — ECHOCARDIOGRAM COMPLETE
Area-P 1/2: 4.26 cm2
Height: 65 in
S' Lateral: 2.7 cm
Weight: 5664 oz

## 2020-11-17 LAB — VITAMIN B12: Vitamin B-12: 364 pg/mL (ref 180–914)

## 2020-11-17 LAB — RESP PANEL BY RT-PCR (FLU A&B, COVID) ARPGX2
Influenza A by PCR: NEGATIVE
Influenza B by PCR: NEGATIVE
SARS Coronavirus 2 by RT PCR: NEGATIVE

## 2020-11-17 LAB — TROPONIN I (HIGH SENSITIVITY)
Troponin I (High Sensitivity): 10 ng/L (ref <18)
Troponin I (High Sensitivity): 11 ng/L (ref <18)

## 2020-11-17 LAB — HEMOGLOBIN A1C
Hgb A1c MFr Bld: 5.8 % — ABNORMAL HIGH (ref 4.8–5.6)
Mean Plasma Glucose: 119.76 mg/dL

## 2020-11-17 LAB — TSH: TSH: 0.74 u[IU]/mL (ref 0.350–4.500)

## 2020-11-17 LAB — PHOSPHORUS
Phosphorus: 3.5 mg/dL (ref 2.5–4.6)
Phosphorus: 2.5 mg/dL (ref 2.5–4.6)

## 2020-11-17 LAB — MRSA NEXT GEN BY PCR, NASAL: MRSA by PCR Next Gen: NOT DETECTED

## 2020-11-17 LAB — MAGNESIUM
Magnesium: 2.4 mg/dL (ref 1.7–2.4)
Magnesium: 2.1 mg/dL (ref 1.7–2.4)

## 2020-11-17 LAB — BRAIN NATRIURETIC PEPTIDE: B Natriuretic Peptide: 420.2 pg/mL — ABNORMAL HIGH (ref 0.0–100.0)

## 2020-11-17 LAB — APTT: aPTT: 63 seconds — ABNORMAL HIGH (ref 24–36)

## 2020-11-17 LAB — AMMONIA: Ammonia: 30 umol/L (ref 9–35)

## 2020-11-17 LAB — HEPARIN LEVEL (UNFRACTIONATED): Heparin Unfractionated: >1.1 IU/mL — ABNORMAL HIGH (ref 0.30–0.70)

## 2020-11-17 MED ORDER — PROPOFOL 1000 MG/100ML IV EMUL
INTRAVENOUS | Status: AC
  Administered 2020-11-17: 23 ug/kg/min via INTRAVENOUS
  Filled 2020-11-17: qty 100

## 2020-11-17 MED ORDER — FENTANYL CITRATE PF 50 MCG/ML IJ SOSY: 100.0000 ug | PREFILLED_SYRINGE | Freq: Once | INTRAMUSCULAR | Status: AC

## 2020-11-17 MED ORDER — MIDAZOLAM HCL 2 MG/2ML IJ SOLN
2.0000 mg | INTRAMUSCULAR | Status: DC | PRN
  Administered 2020-11-18 (×2): 2 mg via INTRAVENOUS
  Filled 2020-11-17 (×2): qty 2

## 2020-11-17 MED ORDER — FENTANYL CITRATE PF 50 MCG/ML IJ SOSY
50.0000 ug | PREFILLED_SYRINGE | INTRAMUSCULAR | Status: DC | PRN
  Administered 2020-11-17: 50 ug via INTRAVENOUS

## 2020-11-17 MED ORDER — POLYETHYLENE GLYCOL 3350 17 G PO PACK
17.0000 g | PACK | Freq: Every day | ORAL | Status: DC
  Administered 2020-11-18 – 2020-11-20 (×3): 17 g
  Filled 2020-11-17 (×4): qty 1

## 2020-11-17 MED ORDER — PROPOFOL 1000 MG/100ML IV EMUL
0.0000 ug/kg/min | INTRAVENOUS | Status: AC
Start: 2020-11-17 — End: 2020-11-18
  Administered 2020-11-17 (×2): 30 ug/kg/min via INTRAVENOUS
  Administered 2020-11-17: 23 ug/kg/min via INTRAVENOUS
  Administered 2020-11-17: 25 ug/kg/min via INTRAVENOUS
  Administered 2020-11-17: 28 ug/kg/min via INTRAVENOUS
  Administered 2020-11-17: 23 ug/kg/min via INTRAVENOUS
  Administered 2020-11-18: 25 ug/kg/min via INTRAVENOUS
  Administered 2020-11-18: 30 ug/kg/min via INTRAVENOUS
  Administered 2020-11-18: 25 ug/kg/min via INTRAVENOUS
  Administered 2020-11-18 (×2): 35 ug/kg/min via INTRAVENOUS
  Filled 2020-11-17: qty 200
  Filled 2020-11-17 (×8): qty 100

## 2020-11-17 MED ORDER — METOPROLOL TARTRATE 25 MG/10 ML ORAL SUSPENSION
50.0000 mg | Freq: Three times a day (TID) | ORAL | Status: DC
  Administered 2020-11-17: 50 mg
  Filled 2020-11-17 (×2): qty 20

## 2020-11-17 MED ORDER — AMIODARONE HCL IN DEXTROSE 360-4.14 MG/200ML-% IV SOLN
60.0000 mg/h | INTRAVENOUS | Status: DC
  Administered 2020-11-17: 60 mg/h via INTRAVENOUS
  Filled 2020-11-17: qty 200
  Filled 2020-11-17: qty 400

## 2020-11-17 MED ORDER — VITAL 1.5 CAL PO LIQD
1000.0000 mL | ORAL | Status: DC
  Administered 2020-11-17 – 2020-11-19 (×3): 1000 mL
  Filled 2020-11-17: qty 1000

## 2020-11-17 MED ORDER — CHLORHEXIDINE GLUCONATE CLOTH 2 % EX PADS
6.0000 | MEDICATED_PAD | Freq: Every day | CUTANEOUS | Status: DC
  Administered 2020-11-17 – 2020-11-27 (×10): 6 via TOPICAL

## 2020-11-17 MED ORDER — AMIODARONE HCL IN DEXTROSE 360-4.14 MG/200ML-% IV SOLN
30.0000 mg/h | INTRAVENOUS | Status: DC
  Administered 2020-11-18 – 2020-11-20 (×4): 30 mg/h via INTRAVENOUS
  Filled 2020-11-17 (×6): qty 200

## 2020-11-17 MED ORDER — ROCURONIUM BROMIDE 10 MG/ML (PF) SYRINGE
PREFILLED_SYRINGE | INTRAVENOUS | Status: AC
  Filled 2020-11-17: qty 10

## 2020-11-17 MED ORDER — SODIUM ZIRCONIUM CYCLOSILICATE 10 G PO PACK
10.0000 g | PACK | Freq: Three times a day (TID) | ORAL | Status: AC
  Administered 2020-11-17 – 2020-11-18 (×3): 10 g
  Filled 2020-11-17 (×4): qty 1

## 2020-11-17 MED ORDER — PROSOURCE TF PO LIQD
90.0000 mL | Freq: Three times a day (TID) | ORAL | Status: DC
  Administered 2020-11-17 – 2020-11-22 (×10): 90 mL
  Filled 2020-11-17 (×12): qty 90

## 2020-11-17 MED ORDER — AMIODARONE HCL IN DEXTROSE 360-4.14 MG/200ML-% IV SOLN
60.0000 mg/h | INTRAVENOUS | Status: DC
  Administered 2020-11-17: 30 mg/h via INTRAVENOUS
  Administered 2020-11-18 – 2020-11-19 (×5): 60 mg/h via INTRAVENOUS
  Filled 2020-11-17 (×2): qty 200

## 2020-11-17 MED ORDER — SUCCINYLCHOLINE CHLORIDE 200 MG/10ML IV SOSY
PREFILLED_SYRINGE | INTRAVENOUS | Status: AC
  Filled 2020-11-17: qty 10

## 2020-11-17 MED ORDER — HEPARIN (PORCINE) 25000 UT/250ML-% IV SOLN
1700.0000 [IU]/h | INTRAVENOUS | Status: DC
  Administered 2020-11-17: 1300 [IU]/h via INTRAVENOUS
  Administered 2020-11-18: 1600 [IU]/h via INTRAVENOUS
  Administered 2020-11-19: 1450 [IU]/h via INTRAVENOUS
  Administered 2020-11-20 (×2): 1500 [IU]/h via INTRAVENOUS
  Administered 2020-11-21 (×2): 1800 [IU]/h via INTRAVENOUS
  Administered 2020-11-22: 1700 [IU]/h via INTRAVENOUS
  Filled 2020-11-17 (×8): qty 250

## 2020-11-17 MED ORDER — DOCUSATE SODIUM 50 MG/5ML PO LIQD
100.0000 mg | Freq: Two times a day (BID) | ORAL | Status: DC | PRN
  Filled 2020-11-17 (×2): qty 10

## 2020-11-17 MED ORDER — HEPARIN (PORCINE) 25000 UT/250ML-% IV SOLN
1200.0000 [IU]/h | INTRAVENOUS | Status: DC
  Administered 2020-11-17: 1200 [IU]/h via INTRAVENOUS
  Filled 2020-11-17 (×2): qty 250

## 2020-11-17 MED ORDER — CHLORHEXIDINE GLUCONATE 0.12% ORAL RINSE (MEDLINE KIT)
15.0000 mL | Freq: Two times a day (BID) | OROMUCOSAL | Status: DC
  Administered 2020-11-17 – 2020-11-20 (×8): 15 mL via OROMUCOSAL

## 2020-11-17 MED ORDER — POLYETHYLENE GLYCOL 3350 17 G PO PACK: 17.0000 g | PACK | Freq: Every day | ORAL | Status: DC | PRN

## 2020-11-17 MED ORDER — FENTANYL CITRATE (PF) 100 MCG/2ML IJ SOLN: 50.0000 ug | INTRAMUSCULAR | Status: DC | PRN

## 2020-11-17 MED ORDER — MIDAZOLAM HCL 2 MG/2ML IJ SOLN
1.0000 mg | INTRAMUSCULAR | Status: DC | PRN
  Administered 2020-11-17: 1 mg via INTRAVENOUS
  Filled 2020-11-17: qty 2

## 2020-11-17 MED ORDER — MIDAZOLAM HCL 2 MG/2ML IJ SOLN
INTRAMUSCULAR | Status: AC
  Filled 2020-11-17: qty 2

## 2020-11-17 MED ORDER — ORAL CARE MOUTH RINSE
15.0000 mL | OROMUCOSAL | Status: DC
  Administered 2020-11-17 – 2020-11-20 (×31): 15 mL via OROMUCOSAL

## 2020-11-17 MED ORDER — MIDAZOLAM HCL 2 MG/2ML IJ SOLN
2.0000 mg | Freq: Once | INTRAMUSCULAR | Status: AC
  Administered 2020-11-17: 2 mg via INTRAVENOUS

## 2020-11-17 MED ORDER — FENTANYL CITRATE (PF) 100 MCG/2ML IJ SOLN
25.0000 ug | INTRAMUSCULAR | Status: DC | PRN
  Administered 2020-11-17: 25 ug via INTRAVENOUS
  Administered 2020-11-17 – 2020-11-18 (×3): 50 ug via INTRAVENOUS
  Filled 2020-11-17 (×4): qty 2

## 2020-11-17 MED ORDER — AMIODARONE LOAD VIA INFUSION
150.0000 mg | Freq: Once | INTRAVENOUS | Status: AC
  Administered 2020-11-17: 150 mg via INTRAVENOUS

## 2020-11-17 MED ORDER — METOPROLOL TARTRATE 5 MG/5ML IV SOLN: 2.5000 mg | INTRAVENOUS | Status: DC | PRN

## 2020-11-17 MED ORDER — ROCURONIUM BROMIDE 50 MG/5ML IV SOLN
INTRAVENOUS | Status: DC | PRN
Start: 2020-11-17 — End: 2020-11-21
  Administered 2020-11-17: 100 mg via INTRAVENOUS

## 2020-11-17 MED ORDER — FENTANYL CITRATE PF 50 MCG/ML IJ SOSY
PREFILLED_SYRINGE | INTRAMUSCULAR | Status: AC
  Administered 2020-11-17: 100 ug via INTRAVENOUS
  Filled 2020-11-17: qty 2

## 2020-11-17 MED ORDER — FENTANYL CITRATE PF 50 MCG/ML IJ SOSY
50.0000 ug | PREFILLED_SYRINGE | INTRAMUSCULAR | Status: DC | PRN
Start: 2020-11-17 — End: 2020-11-17
  Administered 2020-11-17: 50 ug via INTRAVENOUS
  Filled 2020-11-17 (×2): qty 1

## 2020-11-17 MED ORDER — AMIODARONE LOAD VIA INFUSION
150.0000 mg | Freq: Once | INTRAVENOUS | Status: AC
  Administered 2020-11-17: 150 mg via INTRAVENOUS
  Filled 2020-11-17: qty 83.34

## 2020-11-17 MED ORDER — ETOMIDATE 2 MG/ML IV SOLN
INTRAVENOUS | Status: AC
  Filled 2020-11-17: qty 20

## 2020-11-17 MED ORDER — ETOMIDATE 2 MG/ML IV SOLN
INTRAVENOUS | Status: DC | PRN
  Administered 2020-11-17: 20 mg via INTRAVENOUS

## 2020-11-17 MED ORDER — FENTANYL CITRATE (PF) 100 MCG/2ML IJ SOLN: 25.0000 ug | INTRAMUSCULAR | Status: DC | PRN

## 2020-11-17 MED ORDER — IPRATROPIUM-ALBUTEROL 0.5-2.5 (3) MG/3ML IN SOLN
3.0000 mL | Freq: Four times a day (QID) | RESPIRATORY_TRACT | Status: DC
  Administered 2020-11-17 – 2020-11-20 (×16): 3 mL via RESPIRATORY_TRACT
  Filled 2020-11-17 (×16): qty 3

## 2020-11-17 MED ORDER — DOCUSATE SODIUM 50 MG/5ML PO LIQD
100.0000 mg | Freq: Two times a day (BID) | ORAL | Status: DC
  Administered 2020-11-17 – 2020-11-20 (×6): 100 mg
  Filled 2020-11-17 (×6): qty 10

## 2020-11-17 MED ORDER — METOPROLOL TARTRATE 25 MG/10 ML ORAL SUSPENSION: 50.0000 mg | Freq: Three times a day (TID) | ORAL | Status: DC

## 2020-11-17 MED ORDER — AMIODARONE HCL IN DEXTROSE 360-4.14 MG/200ML-% IV SOLN
60.0000 mg/h | INTRAVENOUS | Status: AC
  Administered 2020-11-17 (×2): 60 mg/h via INTRAVENOUS
  Filled 2020-11-17: qty 2600

## 2020-11-17 MED ORDER — PANTOPRAZOLE SODIUM 40 MG IV SOLR
40.0000 mg | Freq: Every day | INTRAVENOUS | Status: DC
  Administered 2020-11-17: 40 mg via INTRAVENOUS
  Filled 2020-11-17: qty 40

## 2020-11-17 MED ORDER — MIDAZOLAM HCL 2 MG/2ML IJ SOLN
1.0000 mg | INTRAMUSCULAR | Status: DC | PRN
Start: 2020-11-17 — End: 2020-11-17
  Administered 2020-11-17: 1 mg via INTRAVENOUS
  Filled 2020-11-17: qty 2

## 2020-11-17 MED ORDER — PHENYLEPHRINE HCL-NACL 20-0.9 MG/250ML-% IV SOLN
0.0000 ug/min | INTRAVENOUS | Status: DC
  Administered 2020-11-17 (×3): 100 ug/min via INTRAVENOUS
  Administered 2020-11-18 (×2): 120 ug/min via INTRAVENOUS
  Administered 2020-11-18: 100 ug/min via INTRAVENOUS
  Administered 2020-11-18: 140 ug/min via INTRAVENOUS
  Administered 2020-11-18: 120 ug/min via INTRAVENOUS
  Administered 2020-11-18: 60 ug/min via INTRAVENOUS
  Administered 2020-11-18: 120 ug/min via INTRAVENOUS
  Administered 2020-11-18: 140 ug/min via INTRAVENOUS
  Administered 2020-11-19: 70 ug/min via INTRAVENOUS
  Administered 2020-11-19: 50 ug/min via INTRAVENOUS
  Filled 2020-11-17 (×7): qty 250
  Filled 2020-11-17: qty 500
  Filled 2020-11-17 (×3): qty 250

## 2020-11-17 MED ORDER — PHENYLEPHRINE 40 MCG/ML (10ML) SYRINGE FOR IV PUSH (FOR BLOOD PRESSURE SUPPORT)
PREFILLED_SYRINGE | INTRAVENOUS | Status: AC
  Filled 2020-11-17: qty 10

## 2020-11-17 MED ORDER — MIDAZOLAM HCL 2 MG/2ML IJ SOLN
INTRAMUSCULAR | Status: AC
  Administered 2020-11-17: 2 mg via INTRAVENOUS
  Filled 2020-11-17: qty 2

## 2020-11-17 NOTE — Progress Notes (Signed)
ANTICOAGULATION CONSULT NOTE  Pharmacy Consult for heparin Indication: atrial fibrillation  No Known Allergies  Patient Measurements: Height: '5\' 5"'$  (165.1 cm) Weight: (!) 160.6 kg (354 lb) IBW/kg (Calculated) : 57 Current weight unknown - 4/21 380lbs Heparin Dosing Weight: 100kg (estimate)  Vital Signs: Temp: 98.6 F (37 C) (09/13 0844) Temp Source: Oral (09/13 0844) BP: 122/80 (09/13 1700) Pulse Rate: 103 (09/13 1700)  Labs: Recent Labs    11/16/20 2114 11/16/20 2118 11/16/20 2324 11/17/20 0055 11/17/20 0159 11/17/20 0731 11/17/20 1238  HGB 12.5 13.6   < > 13.6 13.3 12.7  --   HCT 41.2 40.0   < > 40.0 39.0 40.4  --   PLT 171  --   --   --   --  139*  --   APTT 38*  --   --   --   --   --  63*  LABPROT 18.4*  --   --   --   --   --   --   INR 1.5*  --   --   --   --   --   --   HEPARINUNFRC  --   --   --   --   --   --  >1.10*  CREATININE 2.24* 2.30*  --   --   --  2.41*  --   TROPONINIHS 10  --   --   --   --  11  --    < > = values in this interval not displayed.     Estimated Creatinine Clearance: 30.4 mL/min (A) (by C-G formula based on SCr of 2.41 mg/dL (H)).   Medical History: Past Medical History:  Diagnosis Date   A-fib ([place])    Arthritis    COPD (chronic obstructive pulmonary disease) (HCC)    Hypertension    IBS (irritable bowel syndrome)    Liver failure (HCC)    Morbid obesity (HCC)    Neuropathy    Peripheral vascular disease (HCC)    blood clots in legs   Physical deconditioning    Pre-diabetes    Suspected sleep apnea    Assessment: 75 year old female with history of afib presents to [facility] from nursing facility with AMS and respiratory distress. Per recent refill history from 11/12/20 it appears patient has been receiving apixaban for her afib even though warfarin is also on her list. INR in ED was 1.5 which could also be elevated by recent apixaban. Will evaluate nursing facility [facility] and complete medication reconciliation in am. Last dose  of apixaban unknown but given AMS happened early yesterday afternoon it would have likely been 9/12 am. Will start IV heparin now without bolus. Given ongoing stroke workup will aim for lower end of goal initially.   Heparin level altered by apixaban use, aPTT slightly below goal this afternoon at 63 seconds. Heparin paused for CVL placement, ok to resume per CCM. Will increase rate slightly.  Goal of Therapy:  Heparin level 0.3-0.5 units/ml aPTT 66-85 seconds Monitor platelets by anticoagulation protocol: Yes   Plan:  Resume heparin at 1300 units/h Recheck aPTT in 8h  Arrie Senate, PharmD, [place], [facility] Clinical Pharmacist [phone] Please check AMION for all [facility] Pharmacy numbers 11/17/2020

## 2020-11-17 NOTE — Procedures (Signed)
Patient Name: Jean Stewart  MRN: [id number]  Epilepsy Attending: Lora Havens  Referring Physician/Provider: Dr Lesleigh Noe Date: 11/17/2020 Duration: 24.15 mins  Patient history: 75 year old woman who presented with altered mental status.  EEG to evaluate for seizures.  Level of alertness: Awake, asleep  AEDs during EEG study: Propofol  Technical aspects: This EEG study was done with scalp electrodes positioned according to the 10-20 International system of electrode placement. Electrical activity was acquired at a sampling rate of '500Hz'$  and reviewed with a high frequency filter of '70Hz'$  and a low frequency filter of '1Hz'$ . EEG data were recorded continuously and digitally stored.   Description: During awake state, no clear posterior dominant rhythm was seen.  Sleep was characterized by sleep spindles (12 to 14 Hz), maximal frontocentral region. EEG showed continuous generalized predominantly 5 to 7 Hz theta as well as intermittent generalized 2 to 3 Hz delta slowing hyperventilation and photic stimulation were not performed.     ABNORMALITY - Continuous slow, generalized  IMPRESSION: This study is suggestive of moderate diffuse encephalopathy, nonspecific etiology. No seizures or epileptiform discharges were seen throughout the recording.   Dave Mannes Barbra Sarks

## 2020-11-17 NOTE — Progress Notes (Addendum)
Initial Nutrition Assessment  DOCUMENTATION CODES:   Morbid obesity  INTERVENTION:   Initiate enteral nutrition support via OG tube:   Vital 1.5 at 40 ml/h (1200 ml per day) Prosource TF 90 ml TID  Provides 1680 kcal, 130 gm protein, 729 ml free water daily  TF regimen and propofol at current rate providing 2260 total kcal/day   NUTRITION DIAGNOSIS:   Inadequate oral intake related to inability to eat as evidenced by NPO status.  GOAL:   Patient will meet greater than or equal to 90% of their needs  MONITOR:   Vent status  REASON FOR ASSESSMENT:   Ventilator    ASSESSMENT:   Pt with PMH of morbid obesity, HTN, arthritis, Afib on Eliquis, COPD, IBS, PVD, physical deconditioning, liver failure, presumed sleep apnea admitted with acute metabolic encephalopathy.   Work up for possible CVA, no clear evidence so far. CT negative.   Pt unavailable at this time for procedure.  No skin issues per RN assessment.   Patient is currently intubated on ventilator support MV: 9.4 L/min Temp (24hrs), Avg:98.7 F (37.1 C), Min:98.4 F (36.9 C), Max:99.2 F (37.3 C)  Propofol: 22 ml/hr provides: 580 kcal  Medications reviewed and include: colace, protonix, miralax, lokelma Amiodarone  Labs reviewed: K 5.5, BUN: 28, Cr: 2.41 CBG: 106   16 F OG tube; tip in stomach per xray    Diet Order:   Diet Order             Diet NPO time specified  Diet effective now                   EDUCATION NEEDS:   No education needs have been identified at this time  Skin:  Skin Assessment: Reviewed RN Assessment  Last BM:  unknown  Height:   Ht Readings from Last 1 Encounters:  11/17/20 '5\' 5"'$  (1.651 m)    Weight:   Wt Readings from Last 1 Encounters:  11/17/20 (!) 160.6 kg    BMI:  Body mass index is 58.91 kg/m.  Estimated Nutritional Needs:   Kcal:  1800-2000  Protein:  115-130 grams  Fluid:  >1.8 L/day  Lockie Pares., RD, LDN, CNSC See AMiON for contact  information

## 2020-11-17 NOTE — Progress Notes (Signed)
ANTICOAGULATION CONSULT NOTE - Initial Consult  Pharmacy Consult for heparin Indication: atrial fibrillation  No Known Allergies  Patient Measurements: Height: '5\' 5"'$  (165.1 cm) IBW/kg (Calculated) : 57 Current weight unknown - 4/21 380lbs Heparin Dosing Weight: 100kg (estimate)  Vital Signs: Temp: 98.4 F (36.9 C) (09/12 2117) Temp Source: Axillary (09/12 2117) BP: 110/78 (09/13 0015) Pulse Rate: 122 (09/13 0136)  Labs: Recent Labs    11/16/20 2114 11/16/20 2118 11/16/20 2324 11/17/20 0055 11/17/20 0159  HGB 12.5 13.6 13.9 13.6 13.3  HCT 41.2 40.0 41.0 40.0 39.0  PLT 171  --   --   --   --   APTT 38*  --   --   --   --   LABPROT 18.4*  --   --   --   --   INR 1.5*  --   --   --   --   CREATININE 2.24* 2.30*  --   --   --   TROPONINIHS 10  --   --   --   --     CrCl cannot be calculated (Unknown ideal weight.).   Medical History: Past Medical History:  Diagnosis Date   A-fib ([place])    Arthritis    COPD (chronic obstructive pulmonary disease) (HCC)    Hypertension    IBS (irritable bowel syndrome)    Liver failure (HCC)    Morbid obesity (HCC)    Neuropathy    Peripheral vascular disease (HCC)    blood clots in legs   Physical deconditioning    Pre-diabetes    Suspected sleep apnea    Assessment: 75 year old female with history of afib presents to [facility] from nursing facility with AMS and respiratory distress. Per recent refill history from 11/12/20 it appears patient has been receiving apixaban for her afib even though warfarin is also on her list. INR in ED was 1.5 which could also be elevated by recent apixaban. Will evaluate nursing facility [facility] and complete medication reconciliation in am. Last dose of apixaban unknown but given AMS happened early yesterday afternoon it would have likely been 9/12 am. Will start IV heparin now without bolus. Given ongoing stroke workup will aim for lower end of goal initially.    Goal of Therapy:  Heparin level 0.3-0.5  units/ml aPTT 66-85 seconds Monitor platelets by anticoagulation protocol: Yes   Plan:  Start heparin infusion at 1200 units/hr Check anti-Xa level and aptt in 8 hours and daily while on heparin Continue to monitor H&H and platelets  Erin Hearing PharmD., BCPS Clinical Pharmacist 11/17/2020 3:05 AM

## 2020-11-17 NOTE — Progress Notes (Signed)
Assessed and attempted PIV for this patient. The patient has veins that are deep and small, the patient also was moving while IV attempt was taking place. Needs central line access due to further IV fluids needed, RN Courtney aware of the issues and agreed to call critical care.

## 2020-11-17 NOTE — Procedures (Signed)
Central Venous Catheter Insertion Procedure Note  Maudeen Kimpel  [id number]  [birth year]  Date:11/17/20  Time:5:01 PM   Provider Performing:Marieme Mcmackin Harle Battiest   Procedure: Insertion of Non-tunneled Central Venous [phone]) with US guidance [id number])   Indication(s) Medication administration  Consent Risks of the procedure as well as the alternatives and risks of each were explained to the patient and/or caregiver.  Consent for the procedure was obtained and is signed in the bedside chart  Anesthesia Topical only with 1% lidocaine   Timeout Verified patient identification, verified procedure, site/side was marked, verified correct patient position, special equipment/implants available, medications/allergies/relevant history reviewed, required imaging and test results available.  Sterile Technique Maximal sterile technique including full sterile barrier drape, hand hygiene, sterile gown, sterile gloves, mask, hair covering, sterile ultrasound probe cover (if used).  Procedure Description Area of catheter insertion was cleaned with chlorhexidine and draped in sterile fashion.  With real-time ultrasound guidance a central venous catheter was placed into the left femoral vein. Nonpulsatile blood flow and easy flushing noted in all ports.  The catheter was sutured in place and sterile dressing applied.  Complications/Tolerance None; patient tolerated the procedure well. Chest X-ray is ordered to verify placement for internal jugular or subclavian cannulation.   Chest x-ray is not ordered for femoral cannulation.  EBL Minimal  Specimen(s) None  Procedure was done under the direct supervision of Dr. Ruthann Cancer, with live time US guidance .  Prior attempts were made to the Right IJ, and Left IJ , however, guide wire did not thread at either site.  Therefore Femoral Site was attempted with successful placement of CVC.   CXR will be done as IJ access was attempted  Magdalen Spatz,  MSN, AGACNP-BC [place] for personal pager PCCM on call pager [phone]  11/17/2020 5:04 PM

## 2020-11-17 NOTE — Progress Notes (Signed)
ANTICOAGULATION CONSULT NOTE - Initial Consult  Pharmacy Consult for heparin Indication: atrial fibrillation  No Known Allergies  Patient Measurements: Height: '5\' 5"'$  (165.1 cm) Weight: (!) 160.6 kg (354 lb) IBW/kg (Calculated) : 57 Current weight unknown - 4/21 380lbs Heparin Dosing Weight: 100kg (estimate)  Vital Signs: Temp: 98.6 F (37 C) (09/13 0844) Temp Source: Oral (09/13 0844) BP: 66/43 (09/13 1600) Pulse Rate: 110 (09/13 1600)  Labs: Recent Labs    11/16/20 2114 11/16/20 2118 11/16/20 2324 11/17/20 0055 11/17/20 0159 11/17/20 0731 11/17/20 1238  HGB 12.5 13.6   < > 13.6 13.3 12.7  --   HCT 41.2 40.0   < > 40.0 39.0 40.4  --   PLT 171  --   --   --   --  139*  --   APTT 38*  --   --   --   --   --  63*  LABPROT 18.4*  --   --   --   --   --   --   INR 1.5*  --   --   --   --   --   --   HEPARINUNFRC  --   --   --   --   --   --  >1.10*  CREATININE 2.24* 2.30*  --   --   --  2.41*  --   TROPONINIHS 10  --   --   --   --  11  --    < > = values in this interval not displayed.     Estimated Creatinine Clearance: 30.4 mL/min (A) (by C-G formula based on SCr of 2.41 mg/dL (H)).   Medical History: Past Medical History:  Diagnosis Date   A-fib ([place])    Arthritis    COPD (chronic obstructive pulmonary disease) (HCC)    Hypertension    IBS (irritable bowel syndrome)    Liver failure (HCC)    Morbid obesity (HCC)    Neuropathy    Peripheral vascular disease (HCC)    blood clots in legs   Physical deconditioning    Pre-diabetes    Suspected sleep apnea    Assessment: 75 year old female with history of afib presents to [facility] from nursing facility with AMS and respiratory distress. Per recent refill history from 11/12/20 it appears patient has been receiving apixaban for her afib even though warfarin is also on her list. INR in ED was 1.5 which could also be elevated by recent apixaban. Will evaluate nursing facility [facility] and complete medication reconciliation  in am. Last dose of apixaban unknown but given AMS happened early yesterday afternoon it would have likely been 9/12 am. Will start IV heparin now without bolus. Given ongoing stroke workup will aim for lower end of goal initially.   Heparin drip 1200 uts/hr aptt 63sec at goal   Goal of Therapy:  Heparin level 0.3-0.5 units/ml aPTT 66-85 seconds Monitor platelets by anticoagulation protocol: Yes   Plan:  Heparin off for central line placed  F/u resume heparin after  Daily aptt and CBC    Bonnita Nasuti Pharm.D. CPP, BCPS Clinical Pharmacist [phone] 11/17/2020 4:32 PM

## 2020-11-17 NOTE — H&P (Signed)
NAME:  Jean Stewart, MRN:  [id number], DOB:  03/18/1945, LOS: 0 ADMISSION DATE:  11/16/2020, CONSULTATION DATE: 11/17/2020 REFERRING MD: Dr. Christy Gentles, CHIEF COMPLAINT: Hypercarbia  History of Present Illness:  This is a 75 year old female, past medical history of morbid obesity, hypertension, arthritis, atrial fibrillation on Eliquis, presumed diagnosis of sleep apnea.  Patient presented as a evaluation for code stroke to the emergency department.  With her altered mental status she had an arterial blood gas that was obtained which showed a PCO2 of 99.  She was initially treated with IV Lasix and BiPAP.  However due to her mental status worsening she was intubated and placed on mechanical support.  Patient was initially admitted to the [facility] hospitalist service however now being intubated critical care was asked to consult for medical admission to the intensive care unit.  Patient was seen by neurology for code stroke she did have some focal findings of left gaze preference and reduced blink to threat on the right.  Therefore recommending repeat CT head imaging tomorrow.  Additionally recommending continued work-up for newfound AKI, continuation of Eliquis.  Pertinent  Medical History   Past Medical History:  Diagnosis Date   A-fib ([place])    Arthritis    COPD (chronic obstructive pulmonary disease) (HCC)    Hypertension    IBS (irritable bowel syndrome)    Liver failure (HCC)    Morbid obesity (HCC)    Neuropathy    Peripheral vascular disease (HCC)    blood clots in legs   Physical deconditioning    Pre-diabetes    Suspected sleep apnea      Significant Hospital Events: Including procedures, antibiotic start and stop dates in addition to other pertinent events   11/17/20: Intubation, medical ICU admission  Interim History / Subjective:  Per HPI above.  Unable to speak with patient due to critical illness, intubated on mechanical life support  Objective   Blood pressure 110/78,  pulse (!) 122, temperature 98.4 F (36.9 C), temperature source Axillary, resp. rate (!) 21, SpO2 99 %.    Vent Mode: PRVC FiO2 (%):  [60 %-100 %] 60 % Set Rate:  [22 bmp] 22 bmp Vt Set:  [500 mL] 500 mL PEEP:  [5 cmH20] 5 cmH20 Plateau Pressure:  [22 cmH20] 22 cmH20  No intake or output data in the 24 hours ending 11/17/20 0240 There were no vitals filed for this visit.  Examination: General: Morbidly obese female intubated on mechanical life support HENT: Pupils reactive, endotracheal tube in place Lungs: Bilateral mechanically ventilated breath sounds Cardiovascular: Tachycardic, irregular, S1-S2 Abdomen: Soft, morbidly obese pannus Extremities: No significant edema Neuro: No response to pain, however recently given sedation and paralysis for intubation GU: Deferred  Resolved Hospital Problem list     Assessment & Plan:   Acute metabolic encephalopathy CO2 narcosis Acute on chronic hypercapnic respiratory failure requiring intubation mechanical ventilation - Which I suspect is related to her morbid obesity, likely OSA/OHS Possible CVA Plan: No clear evidence of CVA at this time However due to focal findings per neurology initial exam recommended repeat head CT due to left-sided gaze preference and right-sided reduced blink to attack.  Unable to obtain MRI secondary to body habitus. EEG pending Echo pending Patient's repeat blood gas showed significant reduction in CO2.  We will turn down patient's tidal volume and effort to not over ventilate. Recheck ABG in a few hours. Follow-up encephalopathy labs ordered by neurology  Atrial fibrillation Periodic episodes of RVR Plan: Continue anticoagulation  Start heparin Start beta-blocker, '50mg'$  q8H metoprolol  W/ metoprolol IV prn   AKI Hyperkalemia  P: Repeat BMP in AM  Lasix X'80mg'$  in ED X 1  Follow UOP Bladder scan  Renal US  Avoid nephrotoxic drugs    Best Practice (right click and "Reselect all SmartList  Selections" daily)   Diet/type: NPO DVT prophylaxis: systemic heparin GI prophylaxis: PPI Lines: N/A Foley:  N/A Code Status:  full code Last date of multidisciplinary goals of care discussion [called daughter]  Labs   CBC: Recent Labs  Lab 11/16/20 2114 11/16/20 2118 11/16/20 2324 11/17/20 0055 11/17/20 0159  WBC 7.8  --   --   --   --   NEUTROABS 5.9  --   --   --   --   HGB 12.5 13.6 13.9 13.6 13.3  HCT 41.2 40.0 41.0 40.0 39.0  MCV 106.2*  --   --   --   --   PLT 171  --   --   --   --     Basic Metabolic Panel: Recent Labs  Lab 11/16/20 2114 11/16/20 2118 11/16/20 2324 11/17/20 0055 11/17/20 0159  NA 139 137 138 139 137  K 5.5* 5.4* 5.7* 5.5* 5.5*  CL 99 102  --   --   --   CO2 29  --   --   --   --   GLUCOSE 131* 134*  --   --   --   BUN 23 28*  --   --   --   CREATININE 2.24* 2.30*  --   --   --   CALCIUM 8.8*  --   --   --   --    GFR: CrCl cannot be calculated (Unknown ideal weight.). Recent Labs  Lab 11/16/20 2114 11/16/20 2202  WBC 7.8  --   LATICACIDVEN  --  0.8    Liver Function Tests: Recent Labs  Lab 11/16/20 2114  AST 15  ALT 16  ALKPHOS 117  BILITOT 0.6  PROT 7.3  ALBUMIN 3.4*   No results for input(s): LIPASE, AMYLASE in the last 168 hours. Recent Labs  Lab 11/16/20 2207  AMMONIA 30    ABG    Component Value Date/Time   PHART 7.414 11/17/2020 0159   PCO2ART 51.5 (H) 11/17/2020 0159   PO2ART 338 (H) 11/17/2020 0159   HCO3 32.9 (H) 11/17/2020 0159   TCO2 34 (H) 11/17/2020 0159   O2SAT 100.0 11/17/2020 0159     Coagulation Profile: Recent Labs  Lab 11/16/20 2114  INR 1.5*    Cardiac Enzymes: No results for input(s): CKTOTAL, CKMB, CKMBINDEX, TROPONINI in the last 168 hours.  HbA1C: Hgb A1c MFr Bld  Date/Time Value Ref Range Status  11/16/2020 10:09 PM 5.8 (H) 4.8 - 5.6 % Final    Comment:    (NOTE) Pre diabetes:          5.7%-6.4%  Diabetes:              >6.4%  Glycemic control for   <7.0% adults  with diabetes   05/11/2018 07:43 PM 6.0 (H) 4.8 - 5.6 % Final    Comment:    (NOTE) Pre diabetes:          5.7%-6.4% Diabetes:              >6.4% Glycemic control for   <7.0% adults with diabetes     CBG: Recent Labs  Lab 11/16/20 2112  GLUCAP 138*  Review of Systems:   Unable to be obtained d/t critical illness   Past Medical History:  She,  has a past medical history of A-fib ([place]), Arthritis, COPD (chronic obstructive pulmonary disease) ([place]), Hypertension, IBS (irritable bowel syndrome), Liver failure ([place]), Morbid obesity ([place]), Neuropathy, Peripheral vascular disease ([place]), Physical deconditioning, Pre-diabetes, and Suspected sleep apnea.   Surgical History:   Past Surgical History:  Procedure Laterality Date   EXTERNAL FIXATION LEG Left 12/03/2018   Procedure: EXTERNAL FIXATION LEG;  Surgeon: Shona Needles, MD;  Location: [place];  Service: Orthopedics;  Laterality: Left;   EXTERNAL FIXATION REMOVAL Left 01/25/2019   Procedure: REMOVAL EXTERNAL FIXATION LEG;  Surgeon: Shona Needles, MD;  Location: [place];  Service: Orthopedics;  Laterality: Left;   I & D EXTREMITY Left 12/03/2018   Procedure: IRRIGATION AND DEBRIDEMENT EXTREMITY;  Surgeon: Shona Needles, MD;  Location: [place];  Service: Orthopedics;  Laterality: Left;   ORIF ANKLE FRACTURE Left 12/03/2018   Procedure: OPEN REDUCTION INTERNAL FIXATION (ORIF) ANKLE FRACTURE;  Surgeon: Shona Needles, MD;  Location: [place];  Service: Orthopedics;  Laterality: Left;     Social History:   reports that she has never smoked. She has never used smokeless tobacco. She reports that she does not currently use alcohol. She reports that she does not use drugs.   Family History:  Her family history includes CAD in her mother.   Allergies No Known Allergies   Home Medications  Prior to Admission medications   Medication Sig Start Date End Date Taking? Authorizing Provider  furosemide (LASIX) 40 MG tablet Take 1 tablet (40  mg total) by mouth 2 (two) times daily. Patient not taking: Reported on 01/24/2019 05/19/18   Dessa Phi, DO  gabapentin (NEURONTIN) 100 MG capsule Take 1 capsule (100 mg total) by mouth 3 (three) times daily. Patient taking differently: Take 200 mg by mouth 3 (three) times daily.  12/10/18   Harold Hedge, MD  hydrocortisone 2.5 % cream Apply 1 application topically 2 (two) times daily as needed for itching. 01/20/18   [provider]  metoprolol succinate (TOPROL-XL) 100 MG 24 hr tablet Take 1 tablet (100 mg total) by mouth daily. Take with or immediately following a meal. 05/20/18   Dessa Phi, DO  traMADol (ULTRAM) 50 MG tablet Take 1 tablet (50 mg total) by mouth every 12 (twelve) hours as needed. Patient not taking: Reported on 06/05/2019 01/25/19   Delray Alt, PA-C  Vitamin D, Ergocalciferol, (DRISDOL) 1.25 MG (50000 UT) CAPS capsule Take 1 capsule (50,000 Units total) by mouth every 7 (seven) days. Patient not taking: Reported on 06/05/2019 12/11/18   Harold Hedge, MD  warfarin (COUMADIN) 5 MG tablet Take 1 tablet (5 mg total) by mouth daily. 05/19/18   Dessa Phi, DO    This patient is critically ill with multiple organ system failure; which, requires frequent high complexity decision making, assessment, support, evaluation, and titration of therapies. This was completed through the application of advanced monitoring technologies and extensive interpretation of multiple databases. During this encounter critical care time was devoted to patient care services described in this note for 33 minutes.  Garner Nash, DO [facility] 11/17/2020 2:54 AM

## 2020-11-17 NOTE — Progress Notes (Signed)
Echocardiogram 2D Echocardiogram has been performed.  Oneal Deputy Guilherme Schwenke RDCS 11/17/2020, 12:20 PM

## 2020-11-17 NOTE — Procedures (Signed)
Arterial Catheter Insertion Procedure Note  Jean Stewart  [id number]  02/13/1946  Date:11/17/20  Time:5:20 PM    Provider Performing: Audria Nine    Procedure: Insertion of Arterial Line [phone]) with US guidance [id number])   Indication(s) Blood pressure monitoring and/or need for frequent ABGs  Consent Unable to obtain consent due to emergent nature of procedure.  Anesthesia None   Time Out Verified patient identification, verified procedure, site/side was marked, verified correct patient position, special equipment/implants available, medications/allergies/relevant history reviewed, required imaging and test results available.   Sterile Technique Maximal sterile technique including full sterile barrier drape, hand hygiene, sterile gown, sterile gloves, mask, hair covering, sterile ultrasound probe cover (if used).   Procedure Description Area of catheter insertion was cleaned with chlorhexidine and draped in sterile fashion. With real-time ultrasound guidance an arterial catheter was placed into the right radial artery.  Appropriate arterial tracings confirmed on monitor.     Complications/Tolerance None; patient tolerated the procedure well.   EBL Minimal   Specimen(s) None  No complications appreciated

## 2020-11-17 NOTE — ED Provider Notes (Signed)
I assumed care of patient at shift change. Patient found to be hypercapnic and initially was to be treated with noninvasive ventilation She was responsive to voice when BiPAP was started However she began to decline in her mental status and deteriorated.  Repeat blood gas showed a CO2 over 99 with hypercapnic respiratory failure. I took over care of patient for [facility], and intubated the patient. Intubation was successful without difficulty. I discussed the case with critical care who came to admit the patient. I spoke to the patient's daughter, [name] [phone]) who is aware of the change in condition and admission to ICU  Procedure Name: Intubation Date/Time: 11/17/2020 1:20 AM Performed by: Ripley Fraise, MD Pre-anesthesia Checklist: Patient identified, Patient being monitored, Emergency Drugs available, Timeout performed and Suction available Preoxygenation: Pre-oxygenation with 100% oxygen Induction Type: Rapid sequence Laryngoscope Size: Glidescope Grade View: Grade I Tube size: 7.5 mm Number of attempts: 1 Airway Equipment and Method: Video-laryngoscopy Placement Confirmation: ETT inserted through vocal cords under direct vision, CO2 detector and Breath sounds checked- equal and bilateral Secured at: 22 cm Tube secured with: ETT holder Difficulty Due To: Difficulty was anticipated and Difficult Airway- due to reduced neck mobility Comments: Patient was already on noninvasive ventilation with a pulse ox of 99% Patient tolerated intubation without difficulty    .Critical Care E&M Performed by: Ripley Fraise, MD  Critical care provider statement:    Critical care time (minutes):  40   Critical care start time:  11/17/2020 1:30 AM   Critical care end time:  11/17/2020 2:10 AM   Critical care time was exclusive of:  Separately billable procedures and treating other patients   Critical care was necessary to treat or prevent imminent or life-threatening deterioration of the  following conditions:  Respiratory failure and CNS failure or compromise   Critical care was time spent personally by me on the following activities:  Discussions with consultants, evaluation of patient's response to treatment, examination of patient, review of old charts, re-evaluation of patient's condition, pulse oximetry, ordering and review of radiographic studies, ordering and review of laboratory studies, obtaining history from patient or surrogate, ordering and performing treatments and interventions and ventilator management   I assumed direction of critical care for this patient from another provider in my specialty: yes     Care discussed with: admitting provider   After initial E/M assessment, critical care services were subsequently performed that were exclusive of separately billable procedures or treatment.       Ripley Fraise, MD 11/17/20 [phone]

## 2020-11-17 NOTE — Plan of Care (Signed)
  Problem: Activity: Goal: Ability to tolerate increased activity will improve Outcome: Not Progressing   Problem: Respiratory: Goal: Ability to maintain a clear airway and adequate ventilation will improve Outcome: Not Progressing

## 2020-11-17 NOTE — Progress Notes (Signed)
NAME:  Jean Stewart, MRN:  [id number], DOB:  11-Dec-1945, LOS: 0 ADMISSION DATE:  11/16/2020, CONSULTATION DATE: 11/17/2020 REFERRING MD: Dr. Christy Gentles, CHIEF COMPLAINT: Hypercarbia  History of Present Illness:  This is a 75 year old female, past medical history of morbid obesity, hypertension, arthritis, atrial fibrillation on Eliquis, presumed diagnosis of sleep apnea.  Patient presented as a evaluation for code stroke to the emergency department.  With her altered mental status she had an arterial blood gas that was obtained which showed a PCO2 of 99.  She was initially treated with IV Lasix and BiPAP.  However due to her mental status worsening she was intubated and placed on mechanical support.  Patient was initially admitted to the [facility] hospitalist service however now being intubated critical care was asked to consult for medical admission to the intensive care unit.  Patient was seen by neurology for code stroke she did have some focal findings of left gaze preference and reduced blink to threat on the right.  Therefore recommending repeat CT head imaging tomorrow.  Additionally recommending continued work-up for newfound AKI, continuation of Eliquis.  Pertinent  Medical History   Past Medical History:  Diagnosis Date   A-fib ([place])    Arthritis    COPD (chronic obstructive pulmonary disease) (HCC)    Hypertension    IBS (irritable bowel syndrome)    Liver failure (HCC)    Morbid obesity (HCC)    Neuropathy    Peripheral vascular disease (HCC)    blood clots in legs   Physical deconditioning    Pre-diabetes    Suspected sleep apnea      Significant Hospital Events: Including procedures, antibiotic start and stop dates in addition to other pertinent events   11/17/20: Intubation, medical ICU admission  Interim History / Subjective:  9/13: chart reviewed and pt seen. Titrating vent, abg greatly improved. Having some desat on 40% this am goal sat is 88%  Objective   Blood  pressure 104/86, pulse (!) 105, temperature 98.5 F (36.9 C), temperature source Oral, resp. rate (!) 22, height '5\' 5"'$  (1.651 m), weight (!) 160.6 kg, SpO2 100 %.    Vent Mode: PRVC FiO2 (%):  [40 %-100 %] 40 % Set Rate:  [22 bmp] 22 bmp Vt Set:  [500 mL] 500 mL PEEP:  [5 cmH20] 5 cmH20 Plateau Pressure:  [22 cmH20-23 cmH20] 23 cmH20  No intake or output data in the 24 hours ending 11/17/20 0838 Filed Weights   11/17/20 0500 11/17/20 0527  Weight: (!) 160.6 kg (!) 160.6 kg    Examination: General: Morbidly obese female intubated on mechanical life support HENT: Pupils reactive, endotracheal tube in place Lungs: Bilateral mechanically ventilated breath sounds, clear however Cardiovascular: afib with rvr S1-S2 Abdomen: Soft, morbidly obese pannus Extremities: No significant edema Neuro: coughing and withdraws to pain GU: Deferred  Resolved Hospital Problem list     Assessment & Plan:   Acute metabolic encephalopathy CO2 narcosis Acute on chronic hypercapnic respiratory failure requiring intubation mechanical ventilation - Which I suspect is related to her morbid obesity, likely OSA/OHS Possible CVA Plan: -No clear evidence of CVA at this time -However due to focal findings per neurology initial exam recommended repeat head CT due to left-sided gaze preference and right-sided reduced blink to attack.  Unable to obtain MRI secondary to body habitus. -EEG pending -Echo pending -Patient's repeat blood gas showed significant reduction in CO2.  We will turn down patient's tidal volume and effort to not over ventilate. -goal sat  88 -Follow-up encephalopathy labs ordered by neurology  Atrial fibrillation w/RVR Plan: -Continue heparin infusion - marginal BP -hold metoprolol -hr not responsive to prn opiod so will load with amio - amio infusion, if remains in 120's will rebolus  AKI Hyperkalemia P: -no I/o documented since admit which would be useful for diuresis  -redose  lokelma -repeat BMP at 1900 Bladder scan  Renal US  Avoid nephrotoxic drugs    Heterogenous thyroid:  -outpt f/u but recommend thyroid u/s   Best Practice (right click and "Reselect all SmartList Selections" daily)   Diet/type: NPO DVT prophylaxis: systemic heparin GI prophylaxis: PPI Lines: N/A Foley:  N/A Code Status:  full code Last date of multidisciplinary goals of care discussion [called daughter]  Labs   CBC: Recent Labs  Lab 11/16/20 2114 11/16/20 2118 11/16/20 2324 11/17/20 0055 11/17/20 0159 11/17/20 0731  WBC 7.8  --   --   --   --  10.0  NEUTROABS 5.9  --   --   --   --   --   HGB 12.5 13.6 13.9 13.6 13.3 12.7  HCT 41.2 40.0 41.0 40.0 39.0 40.4  MCV 106.2*  --   --   --   --  103.1*  PLT 171  --   --   --   --  139*    Basic Metabolic Panel: Recent Labs  Lab 11/16/20 2114 11/16/20 2118 11/16/20 2324 11/17/20 0055 11/17/20 0159  NA 139 137 138 139 137  K 5.5* 5.4* 5.7* 5.5* 5.5*  CL 99 102  --   --   --   CO2 29  --   --   --   --   GLUCOSE 131* 134*  --   --   --   BUN 23 28*  --   --   --   CREATININE 2.24* 2.30*  --   --   --   CALCIUM 8.8*  --   --   --   --    GFR: Estimated Creatinine Clearance: 31.8 mL/min (A) (by C-G formula based on SCr of 2.3 mg/dL (H)). Recent Labs  Lab 11/16/20 2114 11/16/20 2202 11/17/20 0731  WBC 7.8  --  10.0  LATICACIDVEN  --  0.8  --     Liver Function Tests: Recent Labs  Lab 11/16/20 2114  AST 15  ALT 16  ALKPHOS 117  BILITOT 0.6  PROT 7.3  ALBUMIN 3.4*   No results for input(s): LIPASE, AMYLASE in the last 168 hours. Recent Labs  Lab 11/16/20 2207  AMMONIA 30    ABG    Component Value Date/Time   PHART 7.414 11/17/2020 0159   PCO2ART 51.5 (H) 11/17/2020 0159   PO2ART 338 (H) 11/17/2020 0159   HCO3 32.9 (H) 11/17/2020 0159   TCO2 34 (H) 11/17/2020 0159   O2SAT 100.0 11/17/2020 0159     Coagulation Profile: Recent Labs  Lab 11/16/20 2114  INR 1.5*    Cardiac Enzymes: No  results for input(s): CKTOTAL, CKMB, CKMBINDEX, TROPONINI in the last 168 hours.  HbA1C: Hgb A1c MFr Bld  Date/Time Value Ref Range Status  11/16/2020 10:09 PM 5.8 (H) 4.8 - 5.6 % Final    Comment:    (NOTE) Pre diabetes:          5.7%-6.4%  Diabetes:              >6.4%  Glycemic control for   <7.0% adults with diabetes   05/11/2018 07:43  PM 6.0 (H) 4.8 - 5.6 % Final    Comment:    (NOTE) Pre diabetes:          5.7%-6.4% Diabetes:              >6.4% Glycemic control for   <7.0% adults with diabetes     CBG: Recent Labs  Lab 11/16/20 2112 11/17/20 0528 11/17/20 0756  GLUCAP 138* 106* 106*    This patient is critically ill with multiple organ system failure; which, requires frequent high complexity decision making, assessment, support, evaluation, and titration of therapies. This was completed through the application of advanced monitoring technologies and extensive interpretation of multiple databases. During this encounter critical care time was devoted to patient care services described in this note for 39 minutes.  Audria Nine, DO [facility] 11/17/2020 8:38 AM

## 2020-11-17 NOTE — Progress Notes (Signed)
[facility] Progress Note Patient Name: Delina Schlomer DOB: April 04, 1945 MRN: [id number]   Date of Service  11/17/2020  HPI/Events of Note  Patient with acute on chronic hypercapnic respiratory failure, altered mental status, r/o CVA, morbid obesity with OSA / OHS.  eICU Interventions  New Patient Evaluation, orders entered.        Kerry Kass Brycelynn Stampley 11/17/2020, 4:24 AM

## 2020-11-17 NOTE — H&P (Signed)
History and Physical    Jean Stewart [id number] DOB: 01/19/1946 DOA: 11/16/2020 PCP: Patient, No Pcp Per (Inactive)  Chief Complaint: AMS, respiratory distress Historian: report, chart review HPI:  Jean Stewart is a 75 y.o. female with a PMH significant for obesity class III, Afib, CHF, anemia. They presented from nursing facility to the ED on 11/16/2020 with code stroke x1 days. She has been on antibiotics for UTI x2 days.  In the ED, it was found that they had hypercapnia and pulmonary congestion. She was responsive to voice initially.  They were treated with IV lasix, bipap, and then eventually intubated.  Patient was admitted to medicine service for further workup and management of AMS and hypercapnic, hypoxemic respiratory failure as outlined in detail below.  Review of Systems: As per HPI otherwise 10 point review of systems negative.   Past Medical History:  Diagnosis Date   A-fib ([place])    Arthritis    COPD (chronic obstructive pulmonary disease) (HCC)    Hypertension    IBS (irritable bowel syndrome)    Liver failure (HCC)    Morbid obesity (HCC)    Neuropathy    Peripheral vascular disease (HCC)    blood clots in legs   Physical deconditioning    Pre-diabetes    Suspected sleep apnea     Past Surgical History:  Procedure Laterality Date   EXTERNAL FIXATION LEG Left 12/03/2018   Procedure: EXTERNAL FIXATION LEG;  Surgeon: Shona Needles, MD;  Location: [facility];  Service: Orthopedics;  Laterality: Left;   EXTERNAL FIXATION REMOVAL Left 01/25/2019   Procedure: REMOVAL EXTERNAL FIXATION LEG;  Surgeon: Shona Needles, MD;  Location: [facility];  Service: Orthopedics;  Laterality: Left;   I & D EXTREMITY Left 12/03/2018   Procedure: IRRIGATION AND DEBRIDEMENT EXTREMITY;  Surgeon: Shona Needles, MD;  Location: [facility];  Service: Orthopedics;  Laterality: Left;   ORIF ANKLE FRACTURE Left 12/03/2018   Procedure: OPEN REDUCTION INTERNAL FIXATION (ORIF) ANKLE FRACTURE;   Surgeon: Shona Needles, MD;  Location: [facility];  Service: Orthopedics;  Laterality: Left;     reports that she has never smoked. She has never used smokeless tobacco. She reports that she does not currently use alcohol. She reports that she does not use drugs.  No Known Allergies  Family History  Problem Relation Age of Onset   CAD Mother        MI at age 81    Prior to Admission medications   Medication Sig Start Date End Date Taking? Authorizing Provider  furosemide (LASIX) 40 MG tablet Take 1 tablet (40 mg total) by mouth 2 (two) times daily. Patient not taking: Reported on 01/24/2019 05/19/18   Dessa Phi, DO  gabapentin (NEURONTIN) 100 MG capsule Take 1 capsule (100 mg total) by mouth 3 (three) times daily. Patient taking differently: Take 200 mg by mouth 3 (three) times daily.  12/10/18   Harold Hedge, MD  hydrocortisone 2.5 % cream Apply 1 application topically 2 (two) times daily as needed for itching. 01/20/18   [provider]  metoprolol succinate (TOPROL-XL) 100 MG 24 hr tablet Take 1 tablet (100 mg total) by mouth daily. Take with or immediately following a meal. 05/20/18   Dessa Phi, DO  traMADol (ULTRAM) 50 MG tablet Take 1 tablet (50 mg total) by mouth every 12 (twelve) hours as needed. Patient not taking: Reported on 06/05/2019 01/25/19   Delray Alt, PA-C  Vitamin D, Ergocalciferol, (DRISDOL) 1.25 MG (50000  UT) CAPS capsule Take 1 capsule (50,000 Units total) by mouth every 7 (seven) days. Patient not taking: Reported on 06/05/2019 12/11/18   Harold Hedge, MD  warfarin (COUMADIN) 5 MG tablet Take 1 tablet (5 mg total) by mouth daily. 05/19/18   Dessa Phi, DO   I have personally, briefly reviewed patient's prior medical records in [place]  Physical Exam: Vitals:   11/16/20 2345 11/16/20 2356 11/17/20 0000 11/17/20 0015  BP: (!) 126/92  (!) 118/94 110/78  Pulse: 91 95 86 77  Resp: (!) 25 (!) 25 (!) 25 15  Temp:      TempSrc:       SpO2: 94% 95% 99% 99%    Constitutional: appears asleep, on Bipap HEENT:Mucous membranes are moist.  Neck: protuberant Respiratory: accessory muscle use, rapid rate Cardiovascular: rapid rate Abdomen: protuberant Musculoskeletal: No obvious deformities Skin: dry, intact, normal color, normal temperature Neurologic: non-responsive  Psychiatric: Normal mood. Congruent affect.  Normal judgement and insight.  Labs on Admission: I have personally reviewed following labs and imaging studies  CBC: Recent Labs  Lab 11/16/20 2114 11/16/20 2118 11/16/20 2324 11/17/20 0055  WBC 7.8  --   --   --   NEUTROABS 5.9  --   --   --   HGB 12.5 13.6 13.9 13.6  HCT 41.2 40.0 41.0 40.0  MCV 106.2*  --   --   --   PLT 171  --   --   --    Basic Metabolic Panel: Recent Labs  Lab 11/16/20 2114 11/16/20 2118 11/16/20 2324 11/17/20 0055  NA 139 137 138 139  K 5.5* 5.4* 5.7* 5.5*  CL 99 102  --   --   CO2 29  --   --   --   GLUCOSE 131* 134*  --   --   BUN 23 28*  --   --   CREATININE 2.24* 2.30*  --   --   CALCIUM 8.8*  --   --   --    GFR: CrCl cannot be calculated (Unknown ideal weight.). Liver Function Tests: Recent Labs  Lab 11/16/20 2114  AST 15  ALT 16  ALKPHOS 117  BILITOT 0.6  PROT 7.3  ALBUMIN 3.4*   No results for input(s): LIPASE, AMYLASE in the last 168 hours. Recent Labs  Lab 11/16/20 2207  AMMONIA 30   Coagulation Profile: Recent Labs  Lab 11/16/20 2114  INR 1.5*   Cardiac Enzymes: No results for input(s): CKTOTAL, CKMB, CKMBINDEX, TROPONINI in the last 168 hours. BNP (last 3 results) No results for input(s): PROBNP in the last 8760 hours. HbA1C: Recent Labs    11/16/20 2209  HGBA1C 5.8*   CBG: Recent Labs  Lab 11/16/20 2112  GLUCAP 138*   Lipid Profile: No results for input(s): CHOL, HDL, LDLCALC, TRIG, CHOLHDL, LDLDIRECT in the last 72 hours. Thyroid Function Tests: No results for input(s): TSH, T4TOTAL, FREET4, T3FREE, THYROIDAB in the  last 72 hours. Anemia Panel: Recent Labs    11/16/20 2256  VITAMINB12 364   Urine analysis:    Component Value Date/Time   COLORURINE YELLOW 06/05/2019 1055   APPEARANCEUR CLEAR 06/05/2019 1055   LABSPEC 1.020 06/05/2019 1055   PHURINE 5.0 06/05/2019 1055   GLUCOSEU NEGATIVE 06/05/2019 1055   HGBUR NEGATIVE 06/05/2019 1055   BILIRUBINUR NEGATIVE 06/05/2019 1055   KETONESUR NEGATIVE 06/05/2019 1055   PROTEINUR 30 (A) 06/05/2019 1055   NITRITE NEGATIVE 06/05/2019 1055   LEUKOCYTESUR NEGATIVE  06/05/2019 1055    Radiological Exams on Admission: DG Chest Port 1 View  Result Date: 11/16/2020 CLINICAL DATA:  Code stroke, possible sepsis, hypoxia EXAM: PORTABLE CHEST 1 VIEW COMPARISON:  12/03/2018 FINDINGS: Cardiomegaly with pulmonary vascular congestion and mild interstitial edema. Suspected small bilateral pleural effusions. No pneumothorax. IMPRESSION: Cardiomegaly with mild interstitial edema and suspected small bilateral pleural effusions. Electronically Signed   By: Julian Hy M.D.   On: 11/16/2020 22:25   CT HEAD CODE STROKE WO CONTRAST  Result Date: 11/16/2020 CLINICAL DATA:  Code stroke.  Left-sided weakness EXAM: CT HEAD WITHOUT CONTRAST TECHNIQUE: Contiguous axial images were obtained from the base of the skull through the vertex without intravenous contrast. COMPARISON:  None. FINDINGS: Brain: There is no mass, hemorrhage or extra-axial collection. The size and configuration of the ventricles and extra-axial CSF spaces are normal. The brain parenchyma is normal, without evidence of acute or chronic infarction. Partially empty sella turcica. Vascular: No abnormal hyperdensity of the major intracranial arteries or dural venous sinuses. No intracranial atherosclerosis. Skull: The visualized skull base, calvarium and extracranial soft tissues are normal. Sinuses/Orbits: No fluid levels or advanced mucosal thickening of the visualized paranasal sinuses. No mastoid or middle ear  effusion. The orbits are normal. ASPECTS [facility] Stroke Program Early CT Score) - Ganglionic level infarction (caudate, lentiform nuclei, internal capsule, insula, M1-M3 cortex): 7 - Supraganglionic infarction (M4-M6 cortex): 3 Total score (0-10 with 10 being normal): 10 IMPRESSION: 1. No acute intracranial abnormality. 2. ASPECTS is 10. These results were communicated to Dr. Lesleigh Noe at 9:25 pm on 11/16/2020 by text page via the [facility] messaging system. Electronically Signed   By: Ulyses Jarred M.D.   On: 11/16/2020 21:35   CT ANGIO HEAD NECK W WO CM (CODE STROKE)  Result Date: 11/16/2020 CLINICAL DATA:  Left-sided weakness EXAM: CT ANGIOGRAPHY HEAD AND NECK TECHNIQUE: Multidetector CT imaging of the head and neck was performed using the standard protocol during bolus administration of intravenous contrast. Multiplanar CT image reconstructions and MIPs were obtained to evaluate the vascular anatomy. Carotid stenosis measurements (when applicable) are obtained utilizing NASCET criteria, using the distal internal carotid diameter as the denominator. CONTRAST:  52m OMNIPAQUE IOHEXOL 350 MG/ML SOLN COMPARISON:  None. FINDINGS: CTA NECK FINDINGS SKELETON: There is no bony spinal canal stenosis. No lytic or blastic lesion. OTHER NECK: Normal pharynx, larynx and major salivary glands. No cervical lymphadenopathy. Enlarged heterogeneous thyroid gland. UPPER CHEST: No pneumothorax or pleural effusion. No nodules or masses. AORTIC ARCH: There is calcific atherosclerosis of the aortic arch. There is no aneurysm, dissection or hemodynamically significant stenosis of the visualized portion of the aorta. Conventional 3 vessel aortic branching pattern. The visualized proximal subclavian arteries are widely patent. RIGHT CAROTID SYSTEM: Normal without aneurysm, dissection or stenosis. LEFT CAROTID SYSTEM: Normal without aneurysm, dissection or stenosis. VERTEBRAL ARTERIES: Left dominant configuration. Both origins are  clearly patent. There is no dissection, occlusion or flow-limiting stenosis to the skull base (V1-V3 segments). CTA HEAD FINDINGS POSTERIOR CIRCULATION: --Vertebral arteries: Normal V4 segments. --Inferior cerebellar arteries: Normal. --Basilar artery: Normal. --Superior cerebellar arteries: Normal. --Posterior cerebral arteries (PCA): Normal. ANTERIOR CIRCULATION: --Intracranial internal carotid arteries: Normal. --Anterior cerebral arteries (ACA): Normal. Both A1 segments are present. Patent anterior communicating artery (a-comm). --Middle cerebral arteries (MCA): Normal. VENOUS SINUSES: As permitted by contrast timing, patent. ANATOMIC VARIANTS: None Review of the MIP images confirms the above findings. IMPRESSION: 1. No emergent large vessel occlusion or high-grade stenosis of the intracranial or cervical arteries. 2.  Enlarged, heterogeneous thyroid gland. Recommend thyroid ultrasound (ref: J Am Coll Radiol. 2015 Feb;12(2): 143-50). Aortic Atherosclerosis (ICD10-I70.0). These results were called by telephone at the time of interpretation on 11/16/2020 at 9:48 pm to provider [facility] , who verbally acknowledged these results. Electronically Signed   By: Ulyses Jarred M.D.   On: 11/16/2020 21:48    EKG: Independently reviewed. Afib/A flutter, low volume, rate 99  Assessment/Plan Active Problems:   Hypoxia  Hypoxemia, hypercapnia respiratory failure- placed on bipap on presentation. Cxray quality poor due to body habitus. Hypervolemia on exam/imaging likely contributing to symptoms. She was given lasix x1 in ed.  Per signout from ED provider, patient was verbally responsive and stable on bipap. Rapid decline in function: On my initial exam, she was on bipap, not responsive to voice or painful stimuli. GCS-3. Ordered a stat ABG which revealed worsened status- pH 7.169, pCO2 99.2, bicarb 36.1. Discussed with ED physician to perform rapid intubation and consulted PCCM for ICU admission.  - f/u  troponin - f/u PCCM consult - repeat head CT 1000 9/13. 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 Moderate-severe MVR, moderate pulmonary hypertension. Home meds include metoprolol - repeat echo - continue IV lasix and follow weight daily, strict I/O,  Hyperkalemia- 5.5 without obvious ECG changes but very low voltage due to body habitus, likely. IV lasix '80mg'$  given in ED. Same on ABG. - BMP am  Afib- irregular rhythm. Rate controlled. Home meds include warfarin. INR 1.5 on presentation. - warfarin per pharmacy.   DVT prophylaxis: warfarin  Code Status: prior is full  Family Communication: daughter with ED provider  Disposition Plan: per her status progression  Consults called: [place], DO [facility]  11/17/2020, 1:12 AM    To contact the appropriate [facility] Attending or Consulting provider: Check the care team in [facility] for a) attending/consulting [facility] provider name and b) the [facility] team listed Log into [email or web address] and use [facility] universal password to access. If you do not have the password, please contact the hospital operator. Locate the [facility] provider you are looking for under [facility] and page to a number that you can be directly reached. (Text pages appreciated) If you still have difficulty reaching the provider, please page the [facility] (Director on Call) for the Hospitalists listed on amion for assistance.

## 2020-11-17 NOTE — Progress Notes (Addendum)
Date and time results received: 11/17/20 0900  (use smartphrase ".now" to insert current time)  Test: lactic Critical Value: 3.2  Name of Provider Notified: [facility]  Orders Received? Or Actions Taken?: providers notified

## 2020-11-17 NOTE — Progress Notes (Signed)
Patient transported on vent to CT and returned to [id number] without complications.

## 2020-11-17 NOTE — Progress Notes (Signed)
EEG completed, results pending. 

## 2020-11-18 DIAGNOSIS — G9340 Encephalopathy, unspecified: Secondary | ICD-10-CM

## 2020-11-18 DIAGNOSIS — N179 Acute kidney failure, unspecified: Secondary | ICD-10-CM

## 2020-11-18 DIAGNOSIS — E875 Hyperkalemia: Secondary | ICD-10-CM

## 2020-11-18 DIAGNOSIS — I4891 Unspecified atrial fibrillation: Secondary | ICD-10-CM

## 2020-11-18 DIAGNOSIS — J9621 Acute and chronic respiratory failure with hypoxia: Secondary | ICD-10-CM | POA: Diagnosis not present

## 2020-11-18 LAB — CBC
HCT: 34.5 % — ABNORMAL LOW (ref 36.0–46.0)
Hemoglobin: 11.4 g/dL — ABNORMAL LOW (ref 12.0–15.0)
MCH: 32.4 pg (ref 26.0–34.0)
MCHC: 33 g/dL (ref 30.0–36.0)
MCV: 98 fL (ref 80.0–100.0)
Platelets: 191 10*3/uL (ref 150–400)
RBC: 3.52 MIL/uL — ABNORMAL LOW (ref 3.87–5.11)
RDW: 13.8 % (ref 11.5–15.5)
WBC: 10.9 10*3/uL — ABNORMAL HIGH (ref 4.0–10.5)
nRBC: 0.5 % — ABNORMAL HIGH (ref 0.0–0.2)

## 2020-11-18 LAB — BASIC METABOLIC PANEL
Anion gap: 11 (ref 5–15)
BUN: 29 mg/dL — ABNORMAL HIGH (ref 8–23)
CO2: 26 mmol/L (ref 22–32)
Calcium: 8.3 mg/dL — ABNORMAL LOW (ref 8.9–10.3)
Chloride: 101 mmol/L (ref 98–111)
Creatinine, Ser: 2.18 mg/dL — ABNORMAL HIGH (ref 0.44–1.00)
GFR, Estimated: 23 mL/min — ABNORMAL LOW (ref >60)
Glucose, Bld: 124 mg/dL — ABNORMAL HIGH (ref 70–99)
Potassium: 3.6 mmol/L (ref 3.5–5.1)
Sodium: 138 mmol/L (ref 135–145)

## 2020-11-18 LAB — HEPARIN LEVEL (UNFRACTIONATED): Heparin Unfractionated: >1.1 IU/mL — ABNORMAL HIGH (ref 0.30–0.70)

## 2020-11-18 LAB — APTT
aPTT: 47 seconds — ABNORMAL HIGH (ref 24–36)
aPTT: 63 seconds — ABNORMAL HIGH (ref 24–36)
aPTT: 89 seconds — ABNORMAL HIGH (ref 24–36)

## 2020-11-18 LAB — MAGNESIUM
Magnesium: 2.1 mg/dL (ref 1.7–2.4)
Magnesium: 2.1 mg/dL (ref 1.7–2.4)

## 2020-11-18 LAB — URINE CULTURE: Culture: NO GROWTH

## 2020-11-18 LAB — PHOSPHORUS
Phosphorus: 2.4 mg/dL — ABNORMAL LOW (ref 2.5–4.6)
Phosphorus: 3.2 mg/dL (ref 2.5–4.6)

## 2020-11-18 LAB — GLUCOSE, CAPILLARY
Glucose-Capillary: 109 mg/dL — ABNORMAL HIGH (ref 70–99)
Glucose-Capillary: 113 mg/dL — ABNORMAL HIGH (ref 70–99)
Glucose-Capillary: 109 mg/dL — ABNORMAL HIGH (ref 70–99)
Glucose-Capillary: 128 mg/dL — ABNORMAL HIGH (ref 70–99)
Glucose-Capillary: 148 mg/dL — ABNORMAL HIGH (ref 70–99)

## 2020-11-18 LAB — LACTIC ACID, PLASMA
Lactic Acid, Venous: 3.1 mmol/L (ref 0.5–1.9)
Lactic Acid, Venous: 2.5 mmol/L (ref 0.5–1.9)

## 2020-11-18 LAB — TRIGLYCERIDES: Triglycerides: 112 mg/dL (ref <150)

## 2020-11-18 MED ORDER — MIDODRINE HCL 5 MG PO TABS
10.0000 mg | ORAL_TABLET | Freq: Three times a day (TID) | ORAL | Status: DC
  Administered 2020-11-18 – 2020-11-20 (×9): 10 mg
  Filled 2020-11-18 (×8): qty 2

## 2020-11-18 MED ORDER — AMIODARONE IV BOLUS ONLY 150 MG/100ML
150.0000 mg | Freq: Once | INTRAVENOUS | Status: DC
  Administered 2020-11-18: 150 mg via INTRAVENOUS

## 2020-11-18 MED ORDER — PANTOPRAZOLE SODIUM 40 MG PO PACK
40.0000 mg | PACK | Freq: Every day | ORAL | Status: DC
  Administered 2020-11-18: 40 mg
  Filled 2020-11-18: qty 20

## 2020-11-18 MED ORDER — SODIUM CHLORIDE 0.9 % IV SOLN
INTRAVENOUS | Status: DC | PRN
  Administered 2020-11-18 – 2020-11-23 (×4): 250 mL via INTRAVENOUS

## 2020-11-18 MED ORDER — AMIODARONE LOAD VIA INFUSION
150.0000 mg | Freq: Once | INTRAVENOUS | Status: AC
  Administered 2020-11-18: 150 mg via INTRAVENOUS
  Filled 2020-11-18: qty 83.34

## 2020-11-18 MED ORDER — FENTANYL 2500MCG IN NS 250ML (10MCG/ML) PREMIX INFUSION
1.0000 ug/h | INTRAVENOUS | Status: DC
Start: 2020-11-18 — End: 2020-11-21
  Administered 2020-11-18: 25 ug/h via INTRAVENOUS
  Administered 2020-11-18: 300 ug/h via INTRAVENOUS
  Administered 2020-11-19: 275 ug/h via INTRAVENOUS
  Filled 2020-11-18 (×3): qty 250

## 2020-11-18 NOTE — Progress Notes (Signed)
ANTICOAGULATION CONSULT NOTE  Pharmacy Consult for heparin Indication: atrial fibrillation  No Known Allergies  Patient Measurements: Height: '5\' 5"'$  (165.1 cm) Weight: (!) 166.5 kg (367 lb) IBW/kg (Calculated) : 57 Current weight unknown - 4/21 380lbs Heparin Dosing Weight: 100kg (estimate)  Vital Signs: Temp: 98.2 F (36.8 C) (09/14 0320) Temp Source: Oral (09/14 0320) BP: 105/64 (09/14 0500) Pulse Rate: 104 (09/14 0500)  Labs: Recent Labs    11/16/20 2114 11/16/20 2118 11/16/20 2324 11/17/20 0159 11/17/20 0731 11/17/20 1238 11/17/20 1900 11/18/20 0343  HGB 12.5 13.6   < > 13.3 12.7  --   --  11.4*  HCT 41.2 40.0   < > 39.0 40.4  --   --  34.5*  PLT 171  --   --   --  139*  --   --  191  APTT 38*  --   --   --   --  63*  --  47*  LABPROT 18.4*  --   --   --   --   --   --   --   INR 1.5*  --   --   --   --   --   --   --   HEPARINUNFRC  --   --   --   --   --  >1.10*  --  >1.10*  CREATININE 2.24* 2.30*  --   --  2.41*  --  2.24*  --   TROPONINIHS 10  --   --   --  11  --   --   --    < > = values in this interval not displayed.     Estimated Creatinine Clearance: 33.5 mL/min (A) (by C-G formula based on SCr of 2.24 mg/dL (H)).   Medical History: Past Medical History:  Diagnosis Date   A-fib ([place])    Arthritis    COPD (chronic obstructive pulmonary disease) (HCC)    Hypertension    IBS (irritable bowel syndrome)    Liver failure (HCC)    Morbid obesity (HCC)    Neuropathy    Peripheral vascular disease (HCC)    blood clots in legs   Physical deconditioning    Pre-diabetes    Suspected sleep apnea    Assessment: 75 year old female with history of afib presents to [facility] from nursing facility with AMS and respiratory distress. Per recent refill history from 11/12/20 it appears patient has been receiving apixaban for her afib even though warfarin is also on her list. INR in ED was 1.5 which could also be elevated by recent apixaban. Will evaluate nursing  facility [facility] and complete medication reconciliation in am. Last dose of apixaban unknown but given AMS happened early yesterday afternoon it would have likely been 9/12 am. Will start IV heparin now without bolus. Given ongoing stroke workup will aim for lower end of goal initially.   Heparin level altered by apixaban use, aPTT below goal this morning at 47 seconds.  Goal of Therapy:  Heparin level 0.3-0.5 units/ml aPTT 66-85 seconds Monitor platelets by anticoagulation protocol: Yes   Plan:  Increase heparin to 1600 units/h Recheck aPTT in [place] PharmD., BCPS Clinical Pharmacist 11/18/2020 5:28 AM

## 2020-11-18 NOTE — Progress Notes (Signed)
[facility] Progress Note Patient Name: Jean Stewart DOB: 1946-02-09 MRN: [id number]   Date of Service  11/18/2020  HPI/Events of Note  Patient in atrial fibrillation with RVR, rate up to 150's, she also has a right hemiparesis on neurological assessment now that she is following commands.  eICU Interventions  Amiodarone 150 mg iv bolus now, stat CT brain ordered to r/o new CVA vs ICH.        Kerry Kass Kainoah Bartosiewicz 11/18/2020, 3:50 AM

## 2020-11-18 NOTE — Progress Notes (Signed)
ANTICOAGULATION CONSULT NOTE  Pharmacy Consult for heparin Indication: atrial fibrillation  No Known Allergies  Patient Measurements: Height: '5\' 5"'$  (165.1 cm) Weight: (!) 166.5 kg (367 lb) IBW/kg (Calculated) : 57 Current weight unknown - 4/21 380lbs Heparin Dosing Weight: 100kg (estimate)  Vital Signs: Temp: 98.2 F (36.8 C) (09/14 2000) Temp Source: Oral (09/14 2000) BP: 120/72 (09/14 2030) Pulse Rate: 117 (09/14 2030)  Labs: Recent Labs    11/16/20 2114 11/16/20 2118 11/17/20 0159 11/17/20 0731 11/17/20 1238 11/17/20 1900 11/18/20 0343 11/18/20 1058 11/18/20 1440 11/18/20 2000  HGB 12.5   < > 13.3 12.7  --   --  11.4*  --   --   --   HCT 41.2   < > 39.0 40.4  --   --  34.5*  --   --   --   PLT 171  --   --  139*  --   --  191  --   --   --   APTT 38*  --   --   --  63*  --  47*  --  63* 89*  LABPROT 18.4*  --   --   --   --   --   --   --   --   --   INR 1.5*  --   --   --   --   --   --   --   --   --   HEPARINUNFRC  --   --   --   --  >1.10*  --  >1.10*  --   --   --   CREATININE 2.24*   < >  --  2.41*  --  2.24*  --  2.18*  --   --   TROPONINIHS 10  --   --  11  --   --   --   --   --   --    < > = values in this interval not displayed.     Estimated Creatinine Clearance: 34.4 mL/min (A) (by C-G formula based on SCr of 2.18 mg/dL (H)).   Medical History: Past Medical History:  Diagnosis Date   A-fib ([place])    Arthritis    COPD (chronic obstructive pulmonary disease) (HCC)    Hypertension    IBS (irritable bowel syndrome)    Liver failure (HCC)    Morbid obesity (HCC)    Neuropathy    Peripheral vascular disease (HCC)    blood clots in legs   Physical deconditioning    Pre-diabetes    Suspected sleep apnea    Assessment: 75 year old female with history of afib presents to [facility] from nursing facility with AMS and respiratory distress. Per recent refill history from 11/12/20 it appears patient has been receiving apixaban for her afib even though  warfarin is also on her list. INR in ED was 1.5 which could also be elevated by recent apixaban. Will evaluate nursing facility [facility] and complete medication reconciliation in am. Last dose of apixaban unknown but given AMS happened early yesterday afternoon it would have likely been 9/12 am. Will start IV heparin now without bolus. Given ongoing stroke workup will aim for lower end of goal initially.   Aptt slightly high 89  Goal of Therapy:  Heparin level 0.3-0.5 units/ml aPTT 66-85 seconds Monitor platelets by anticoagulation protocol: Yes   Plan:  Reduce heparin to 1550 units/hr Recheck in am  Barth Kirks, PharmD, [place] Clinical Pharmacist  [phone]  Please check AMION for all [place] numbers  11/18/2020 9:15 PM

## 2020-11-18 NOTE — Progress Notes (Signed)
[facility] Progress Note Patient Name: Jean Stewart DOB: January 12, 1946 MRN: [id number]   Date of Service  11/18/2020  HPI/Events of Note  Patient more awake and neurological exam is non-focal.  eICU Interventions  CT brain canceled.        Kerry Kass Reynolds Kittel 11/18/2020, 4:12 AM

## 2020-11-18 NOTE — Progress Notes (Addendum)
NAME:  Jean Stewart, MRN:  [id number], DOB:  14-Jun-1945, LOS: 1 ADMISSION DATE:  11/16/2020, CONSULTATION DATE: 11/17/2020 REFERRING MD: Dr. Christy Gentles, CHIEF COMPLAINT: Hypercarbia  History of Present Illness:  This is a 75 year old female, past medical history of morbid obesity, hypertension, arthritis, atrial fibrillation on Eliquis, presumed diagnosis of sleep apnea.  Patient presented as a evaluation for code stroke to the emergency department.  With her altered mental status she had an arterial blood gas that was obtained which showed a PCO2 of 99.  She was initially treated with IV Lasix and BiPAP.  However due to her mental status worsening she was intubated and placed on mechanical support.  Patient was initially admitted to the [facility] however now being intubated critical care was asked to consult for medical admission to the intensive care unit.  Patient was seen by neurology for code stroke she did have some focal findings of left gaze preference and reduced blink to threat on the right.  Therefore recommending repeat CT head imaging tomorrow.  Additionally recommending continued work-up for newfound AKI, continuation of Eliquis.  Pertinent  Medical History   Past Medical History:  Diagnosis Date   A-fib ([place])    Arthritis    COPD (chronic obstructive pulmonary disease) (HCC)    Hypertension    IBS (irritable bowel syndrome)    Liver failure (HCC)    Morbid obesity (HCC)    Neuropathy    Peripheral vascular disease (HCC)    blood clots in legs   Physical deconditioning    Pre-diabetes    Suspected sleep apnea      Significant Hospital Events: Including procedures, antibiotic start and stop dates in addition to other pertinent events   11/17/20: Intubation, medical ICU admission  Interim History / Subjective:  Overnight: afib rvr rate in 150s; given amio 150 bolus  Today HR 120s Intubated on PRVC 40% 5 peep On Neo On propofol and fentanyl for  sedation; patient alert and following commands On amio  Objective   Blood pressure 99/65, pulse (!) 113, temperature 98.6 F (37 C), temperature source Oral, resp. rate (!) 22, height '5\' 5"'$  (1.651 m), weight (!) 166.5 kg, SpO2 100 %.    Vent Mode: PRVC FiO2 (%):  [40 %-60 %] 40 % Set Rate:  [22 bmp] 22 bmp Vt Set:  [450 mL] 450 mL PEEP:  [5 cmH20] 5 cmH20 Plateau Pressure:  [17 cmH20-22 cmH20] 17 cmH20   Intake/Output Summary (Last 24 hours) at 11/18/2020 0914 Last data filed at 11/18/2020 0700 Gross per 24 hour  Intake 2360.44 ml  Output 985 ml  Net 1375.44 ml   Filed Weights   11/17/20 0500 11/17/20 0527 11/18/20 0433  Weight: (!) 160.6 kg (!) 160.6 kg (!) 166.5 kg    Examination: General:  critically ill appearing on mech vent HEENT: MM pink/moist; ETT in place Neuro: AO on sedation CV: s1s2, afib rvr with rate 120 on amio, no m/r/g PULM:  dim clear BS bilaterally; on mech vent PRVC 40% 5 peep GI: soft, bsx4 active  Extremities: warm/dry, L fem central line in place Skin: no rashes or lesions   Labs: WBC 10.9 (10) ABG: 7.41, 51, 338, 33 Lactic acid 3.1 (3.2) BMP 9/13: K 4.2 (5.5): BUN 28, creat 2.24   Resolved Hospital Problem list     Assessment & Plan:   Acute on chronic hypercapnic respiratory failure requiring intubation mechanical ventilation - Which I suspect is related to her morbid obesity, likely OSA/OHS  Hx of COPD P: -continue PRVC 6-8 cc/kg -wean fio2 for sats >92% -VAP prevention in place -continue duoneb q6 -daily SAT/SBT -will likely need BiPAP/CPAP when able to extubate for likely OSA/OHS  Acute metabolic encephalopathy: [id number] EEG indicative of moderate encephalopathy  CO2 narcosis Possible CVA: EEG moderate diffuse encephalopathy P: -neuro following -will hold on repeat head CT (no MRI due to body habitus); patient awake following commands -limit sedation for neuro checks -will up fentanyl drip and try to come off propofol today; Rass  goal 0 to -1  Shock: likely sedation related P: -wean sedation for RASS 0 to -1 -continue to wean neo for MAP goal >65 -consider adding vaso if neo requirements increase -follow Bcx2; will send sputum culture -will start on midodrine  Atrial fibrillation w/RVR Elevated L ventricular End-diastolic pressure: Echo [id number]: EF 55-60% P: -continue heparin and amio -metoprolol on hold due to hypotension -continue telemetry monitoring -hold home lasix due to hypotension  AKI Hyperkalemia: improved P: -continue to monitor BMP/UOP -avoid nephrotoxic drugs; renal dose meds  Heterogenous thyroid:  -outpt f/u but recommend thyroid u/s   Best Practice (right click and "Reselect all SmartList Selections" daily)   Diet/type: NPO DVT prophylaxis: systemic heparin GI prophylaxis: PPI Lines: Central line and Arterial Line Foley:  N/A Code Status:  full code Last date of multidisciplinary goals of care discussion [9/14: updated daughter over phone]    Critical Care Time: 35 minutes  JD Rexene Agent [facility] 11/18/2020, 10:45 AM  Please see [email or web address] for pager details.  From 7A-7P if no response, please call [phone]. After hours, please call ELink [phone].

## 2020-11-18 NOTE — Progress Notes (Signed)
Neurology Progress Note  Brief HPI: 75 y.o. female with PMHx of AF on Eliquis, HTN, HFrEF, morbid obesity with BMI > 40, HTN, pre-DM, presumed OSA, COPD, reported liver failure, and PVD who presented to the ED 9/12 from SNF for evaluation of altered mental status, left-sided gaze, and left-sided weakness. CTH was obtained without evidence of acute intracranial abnormality. MRI brain was unable to be obtained 2/2 patient's body habitus. Due to hypercarbia and progressively worsening mental status she required intubation 9/13 and ICU monitoring. 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   Subjective: No acute overnight events With patient intubation, pharmacy has been consulted for transition to heparin in the setting of AF.   Exam: Vitals:   11/18/20 0830 11/18/20 0845  BP:    Pulse: (!) 121 (!) 113  Resp: (!) 22 (!) 22  Temp:    SpO2: 100% 100%   Gen: Laying in ICU bed, intubated without sedation. Appears anxious but is cooperative with examination. Resp: Respirations supported via mechanical ventilation, oral ETT secured, spontaneous respirations present over set ventilator rate Abd: soft, non-distended  Neuro: Mental Status: Awake and alert on examination. She remains intubated without sedation in the ICU. She is able to nod her head appropriately to examiner questions.  She follows simple commands without difficulty. She appears slightly anxious but cooperates with examination. No neglect is noted  Cranial Nerves: PERRL, EOMI without gaze preference or nystagmus, she fixates and tracks visual stimuli throughout, hearing is intact to voice, head is grossly midline, she does not protrude tongue to command. Cough and gag reflexes remain intact.  Motor: Moves all extremities to command, wiggles toes to command and elevates bilateral upper extremities to command. There is no  noted asymmetry. Tone is normal.  Sensory: Sensation to light touch is intact and symmetric throughout.  Gait: Deferred for patient safety  Pertinent Labs: CBC    Component Value Date/Time   WBC 10.9 (H) 11/18/2020 0343   RBC 3.52 (L) 11/18/2020 0343   HGB 11.4 (L) 11/18/2020 0343   HCT 34.5 (L) 11/18/2020 0343   PLT 191 11/18/2020 0343   MCV 98.0 11/18/2020 0343   MCH 32.4 11/18/2020 0343   MCHC 33.0 11/18/2020 0343   RDW 13.8 11/18/2020 0343   LYMPHSABS 1.2 11/16/2020 2114   MONOABS 0.5 11/16/2020 2114   EOSABS 0.0 11/16/2020 2114   BASOSABS 0.0 11/16/2020 2114   CMP     Component Value Date/Time   NA 137 11/17/2020 1900   K 4.2 11/17/2020 1900   CL 99 11/17/2020 1900   CO2 28 11/17/2020 1900   GLUCOSE 110 (H) 11/17/2020 1900   BUN 28 (H) 11/17/2020 1900   CREATININE 2.24 (H) 11/17/2020 1900   CALCIUM 8.5 (L) 11/17/2020 1900   PROT 7.3 11/16/2020 2114   ALBUMIN 3.4 (L) 11/16/2020 2114   AST 15 11/16/2020 2114   ALT 16 11/16/2020 2114   ALKPHOS 117 11/16/2020 2114   BILITOT 0.6 11/16/2020 2114   GFRNONAA 22 (L) 11/17/2020 1900   GFRAA 56 (L) 06/05/2019 1125   Urinalysis    Component Value Date/Time   COLORURINE YELLOW 11/17/2020 0400   APPEARANCEUR CLEAR 11/17/2020 0400   LABSPEC 1.012 11/17/2020 0400   PHURINE 5.0 11/17/2020 0400   GLUCOSEU NEGATIVE 11/17/2020 0400   HGBUR NEGATIVE 11/17/2020 0400   BILIRUBINUR NEGATIVE 11/17/2020 0400   KETONESUR NEGATIVE 11/17/2020 0400   PROTEINUR NEGATIVE  11/17/2020 0400   NITRITE NEGATIVE 11/17/2020 0400   LEUKOCYTESUR NEGATIVE 11/17/2020 0400   Drugs of Abuse  No results found for: LABOPIA, COCAINSCRNUR, LABBENZ, AMPHETMU, THCU, LABBARB  Alcohol Level No results found for: [facility]  Lab Results  Component Value Date   CHOL 150 11/17/2020   HDL 39 (L) 11/17/2020   LDLCALC 101 (H) 11/17/2020   TRIG 112 11/18/2020   CHOLHDL 3.8 11/17/2020   Lab Results  Component Value Date   HGBA1C 5.8 (H) 11/16/2020   Lab  Results  Component Value Date   VITAMINB12 364 11/16/2020   Lab Results  Component Value Date   TSH 0.740 11/16/2020   Ammonia 9/12: 30  Imaging Reviewed:  Repeat CT Head 9/13: 1. No evidence of acute intracranial abnormality. 2. Similar chronic microvascular disease and atrophy. 3. 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  Routine EEG 9/13: "This study is suggestive of moderate diffuse encephalopathy, nonspecific etiology. No seizures or epileptiform discharges were seen throughout the recording."  CT Head code stroke 9/12: 1. No acute intracranial abnormality. 2. ASPECTS is 10.  CT angio head and neck code stroke 9/12: 1. No emergent large vessel occlusion or high-grade stenosis of the intracranial or cervical arteries. 2. Enlarged, heterogeneous thyroid gland. Recommend thyroid ultrasound (ref: J Am Coll Radiol. 2015 Feb;12(2): 143-50).  Echocardiogram 9/13:  1. Left ventricular ejection fraction, by estimation, is 55 to 60%. The left ventricle has normal function. The left ventricle has no regional wall motion abnormalities. There is mild concentric left ventricular hypertrophy. Left ventricular diastolic parameters are indeterminate. Elevated left ventricular end-diastolic pressure.   2. Right ventricular systolic function is normal. The right ventricular size is normal. There is moderately elevated pulmonary artery systolic pressure.   3. Left atrial size was mildly dilated.   4. Right atrial size was mildly dilated.   5. The mitral valve is normal in structure. Trivial mitral valve regurgitation. No evidence of mitral stenosis.   6. Tricuspid valve regurgitation is mild to moderate.   7. The aortic valve is tricuspid. Aortic valve regurgitation is not visualized. No aortic stenosis is present.   8. Aortic dilatation noted. There is mild dilatation of the ascending aorta, measuring 41 mm.   9. The inferior vena cava is dilated in size with <50% respiratory  variability, suggesting right atrial pressure of 15 mmHg.   Assessment: 75 y.o. female who presented to the ED 9/12 for evaluation of shortness of breath, altered mental status, left-sided gaze, and left-sided weakness with decreased verbal responsiveness and inability to follow commands. She was activated as a code stroke and initial imaging was without evidence of intracranial abnormality. Due to body habitus, MRI was unable to be obtained. 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 Due to ongoing decreased mental status and hypercarbia, patient required intubation on 9/13.  - Examination reveals patient who is intubated in the ICU without sedation who is able to follow commands without focal unilateral weakness and without gaze preference. She is able to nod appropriately to examiner questions. This in conjunction with improved ABG results.  - Vessel imaging is without LVO or high-grade stenosis of the intracranial or cervical arteries.  - Repeat CTH revealed probably tiny remote right cerebellar infarct which would not explain her acute symptoms and her presentation does not localize to the right cerebellum.  - EEG was suggestive of moderate diffuse encephalopathy without specific etiology without seizures or epileptiform discharges.  -  Differential diagnoses for her encephalopathy presentation include obesity hypoventilation syndrome with hypercarbia, seizure with post-ictal state, toxic-metabolic derangements with AKI and hyperkalemi\a or acute infarction. It is felt less likely that the presentation is due to acute stroke with negative vessel imaging and patient improvement without residual symptoms with improvement in ABG results and lactic acid levels. There is low suspicion for seizure etiology at this time though it is possible for new seizure onset in the setting of toxic-metabolic derangements with hypercarbia and  AKI.   Impression:  Acute encephalopathy with altered mental status; improving Acute on chronic hypercapnic respiratory failure requiring mechanical ventilation; improving AKI with hyperkalemia; improving   Recommendations: - Thiamine, MMA levels pending - Continue heparin gtt per pharmacy dosing for stroke prophylaxis - Management of respiratory failure / hypercarbia, comorbid conditions, metabolic derangements per primary team - No further neurology recommendations at this time. Please contact us for further questions or concerns.   Anibal Henderson, AGACNP-BC [facility] [phone]

## 2020-11-19 DIAGNOSIS — N179 Acute kidney failure, unspecified: Secondary | ICD-10-CM | POA: Diagnosis not present

## 2020-11-19 DIAGNOSIS — J9621 Acute and chronic respiratory failure with hypoxia: Secondary | ICD-10-CM | POA: Diagnosis not present

## 2020-11-19 DIAGNOSIS — J9622 Acute and chronic respiratory failure with hypercapnia: Secondary | ICD-10-CM | POA: Diagnosis not present

## 2020-11-19 DIAGNOSIS — G9340 Encephalopathy, unspecified: Secondary | ICD-10-CM | POA: Diagnosis not present

## 2020-11-19 LAB — BASIC METABOLIC PANEL
Anion gap: 11 (ref 5–15)
BUN: 29 mg/dL — ABNORMAL HIGH (ref 8–23)
CO2: 28 mmol/L (ref 22–32)
Calcium: 8.2 mg/dL — ABNORMAL LOW (ref 8.9–10.3)
Chloride: 100 mmol/L (ref 98–111)
Creatinine, Ser: 1.75 mg/dL — ABNORMAL HIGH (ref 0.44–1.00)
GFR, Estimated: 30 mL/min — ABNORMAL LOW (ref >60)
Glucose, Bld: 138 mg/dL — ABNORMAL HIGH (ref 70–99)
Potassium: 3.7 mmol/L (ref 3.5–5.1)
Sodium: 139 mmol/L (ref 135–145)

## 2020-11-19 LAB — APTT
aPTT: 89 seconds — ABNORMAL HIGH (ref 24–36)
aPTT: 63 seconds — ABNORMAL HIGH (ref 24–36)

## 2020-11-19 LAB — CBC
HCT: 31.2 % — ABNORMAL LOW (ref 36.0–46.0)
Hemoglobin: 10.2 g/dL — ABNORMAL LOW (ref 12.0–15.0)
MCH: 32.7 pg (ref 26.0–34.0)
MCHC: 32.7 g/dL (ref 30.0–36.0)
MCV: 100 fL (ref 80.0–100.0)
Platelets: 150 10*3/uL (ref 150–400)
RBC: 3.12 MIL/uL — ABNORMAL LOW (ref 3.87–5.11)
RDW: 14.6 % (ref 11.5–15.5)
WBC: 9.8 10*3/uL (ref 4.0–10.5)
nRBC: 0.2 % (ref 0.0–0.2)

## 2020-11-19 LAB — GLUCOSE, CAPILLARY
Glucose-Capillary: 121 mg/dL — ABNORMAL HIGH (ref 70–99)
Glucose-Capillary: 129 mg/dL — ABNORMAL HIGH (ref 70–99)
Glucose-Capillary: 132 mg/dL — ABNORMAL HIGH (ref 70–99)
Glucose-Capillary: 139 mg/dL — ABNORMAL HIGH (ref 70–99)
Glucose-Capillary: 143 mg/dL — ABNORMAL HIGH (ref 70–99)
Glucose-Capillary: 153 mg/dL — ABNORMAL HIGH (ref 70–99)

## 2020-11-19 LAB — HEPARIN LEVEL (UNFRACTIONATED): Heparin Unfractionated: >1.1 IU/mL — ABNORMAL HIGH (ref 0.30–0.70)

## 2020-11-19 LAB — METHYLMALONIC ACID, SERUM: Methylmalonic Acid, Quantitative: 475 nmol/L — ABNORMAL HIGH (ref 0–378)

## 2020-11-19 LAB — PHOSPHORUS: Phosphorus: 3.2 mg/dL (ref 2.5–4.6)

## 2020-11-19 LAB — MAGNESIUM: Magnesium: 2.1 mg/dL (ref 1.7–2.4)

## 2020-11-19 MED ORDER — SODIUM CHLORIDE 0.9% FLUSH: 10.0000 mL | INTRAVENOUS | Status: DC | PRN

## 2020-11-19 MED ORDER — PANTOPRAZOLE 2 MG/ML SUSPENSION
40.0000 mg | Freq: Every day | ORAL | Status: DC
  Administered 2020-11-19: 40 mg

## 2020-11-19 MED ORDER — SODIUM CHLORIDE 0.9% FLUSH
10.0000 mL | Freq: Two times a day (BID) | INTRAVENOUS | Status: DC
  Administered 2020-11-19: 10 mL
  Administered 2020-11-19: 20 mL
  Administered 2020-11-19 – 2020-11-20 (×2): 10 mL
  Administered 2020-11-20: 40 mL
  Administered 2020-11-21 – 2020-11-27 (×10): 10 mL

## 2020-11-19 MED ORDER — DEXMEDETOMIDINE HCL IN NACL 400 MCG/100ML IV SOLN
0.0000 ug/kg/h | INTRAVENOUS | Status: DC
  Administered 2020-11-19 (×3): 0.4 ug/kg/h via INTRAVENOUS
  Administered 2020-11-20: 0.3 ug/kg/h via INTRAVENOUS
  Administered 2020-11-20: 0.5 ug/kg/h via INTRAVENOUS
  Administered 2020-11-20 – 2020-11-21 (×2): 0.2 ug/kg/h via INTRAVENOUS
  Filled 2020-11-19 (×7): qty 100

## 2020-11-19 NOTE — Progress Notes (Signed)
[place] for heparin Indication: atrial fibrillation  No Known Allergies  Patient Measurements: Height: '5\' 5"'$  (165.1 cm) Weight: (!) 166.5 kg (367 lb) IBW/kg (Calculated) : 57 Heparin Dosing Weight: 103 kg  Vital Signs: Temp: 99.3 F (37.4 C) (09/15 0400) Temp Source: Axillary (09/15 0400) BP: 110/64 (09/15 0400) Pulse Rate: 105 (09/15 0430)  Labs: Recent Labs    11/16/20 2114 11/16/20 2118 11/17/20 0731 11/17/20 1238 11/17/20 1900 11/18/20 0343 11/18/20 1058 11/18/20 1440 11/18/20 2000 11/19/20 0400  HGB 12.5   < > 12.7  --   --  11.4*  --   --   --  10.2*  HCT 41.2   < > 40.4  --   --  34.5*  --   --   --  31.2*  PLT 171  --  139*  --   --  191  --   --   --  150  APTT 38*  --   --  63*  --  47*  --  63* 89* 89*  LABPROT 18.4*  --   --   --   --   --   --   --   --   --   INR 1.5*  --   --   --   --   --   --   --   --   --   HEPARINUNFRC  --   --   --  >1.10*  --  >1.10*  --   --   --  >1.10*  CREATININE 2.24*   < > 2.41*  --  2.24*  --  2.18*  --   --   --   TROPONINIHS 10  --  11  --   --   --   --   --   --   --    < > = values in this interval not displayed.     Estimated Creatinine Clearance: 34.4 mL/min (A) (by C-G formula based on SCr of 2.18 mg/dL (H)).   Medical History: Past Medical History:  Diagnosis Date   A-fib ([place])    Arthritis    COPD (chronic obstructive pulmonary disease) (HCC)    Hypertension    IBS (irritable bowel syndrome)    Liver failure (HCC)    Morbid obesity (HCC)    Neuropathy    Peripheral vascular disease (HCC)    blood clots in legs   Physical deconditioning    Pre-diabetes    Suspected sleep apnea    Assessment: 75 year old female with history of afib presents to [facility] from nursing facility with AMS and respiratory distress. Per recent refill history from 11/12/20 it appears patient has been receiving apixaban for her afib even though warfarin is also on her list. INR in ED was  1.5 which could also be elevated by recent apixaban. Will evaluate nursing facility [facility] and complete medication reconciliation in am. Last dose of apixaban unknown but given AMS happened early yesterday afternoon it would have likely been 9/12 am. Will start IV heparin now without bolus. Given ongoing stroke workup will aim for lower end of goal initially.   Heparin level >1.1 (affected by apixaban), aPTT 89 sec (supratherapeutic for lower goal) on heparin at 1550 units/hr. No bleeding noted.  Goal of Therapy:  Heparin level 0.3-0.5 units/ml aPTT 66-85 seconds Monitor platelets by anticoagulation protocol: Yes   Plan:  Reduce heparin to 1450 units/hr F/u 8 hr aPTT  Sherlon Handing, PharmD, BCPS Please see amion for complete clinical pharmacist phone list 11/19/2020 5:03 AM  11/19/2020 5:01 AM

## 2020-11-19 NOTE — Progress Notes (Signed)
[place] for heparin Indication: atrial fibrillation  No Known Allergies  Patient Measurements: Height: '5\' 5"'$  (165.1 cm) Weight: (!) 173 kg (381 lb 6.3 oz) IBW/kg (Calculated) : 57 Heparin Dosing Weight: 103 kg  Vital Signs: Temp: 98.6 F (37 C) (09/15 1134) Temp Source: Axillary (09/15 1134) BP: 137/80 (09/15 1200) Pulse Rate: 68 (09/15 1330)  Labs: Recent Labs    11/16/20 2114 11/16/20 2118 11/17/20 0731 11/17/20 1238 11/17/20 1900 11/18/20 0343 11/18/20 1058 11/18/20 1440 11/18/20 2000 11/19/20 0400 11/19/20 1244  HGB 12.5   < > 12.7  --   --  11.4*  --   --   --  10.2*  --   HCT 41.2   < > 40.4  --   --  34.5*  --   --   --  31.2*  --   PLT 171  --  139*  --   --  191  --   --   --  150  --   APTT 38*  --   --  63*  --  47*  --    < > 89* 89* 63*  LABPROT 18.4*  --   --   --   --   --   --   --   --   --   --   INR 1.5*  --   --   --   --   --   --   --   --   --   --   HEPARINUNFRC  --   --   --  >1.10*  --  >1.10*  --   --   --  >1.10*  --   CREATININE 2.24*   < > 2.41*  --  2.24*  --  2.18*  --   --  1.75*  --   TROPONINIHS 10  --  11  --   --   --   --   --   --   --   --    < > = values in this interval not displayed.     Estimated Creatinine Clearance: 43.9 mL/min (A) (by C-G formula based on SCr of 1.75 mg/dL (H)).   Medical History: Past Medical History:  Diagnosis Date   A-fib ([place])    Arthritis    COPD (chronic obstructive pulmonary disease) (HCC)    Hypertension    IBS (irritable bowel syndrome)    Liver failure (HCC)    Morbid obesity (HCC)    Neuropathy    Peripheral vascular disease (HCC)    blood clots in legs   Physical deconditioning    Pre-diabetes    Suspected sleep apnea    Assessment: 75 year old female with history of afib presents to [facility] from nursing facility with AMS and respiratory distress. Per recent refill history from 11/12/20 it appears patient has been receiving apixaban for her  afib even though warfarin is also on her list. INR in ED was 1.5 which could also be elevated by recent apixaban. Will evaluate nursing facility [facility] and complete medication reconciliation in am. Last dose of apixaban unknown but given AMS happened early yesterday afternoon it would have likely been 9/12 am. Will start IV heparin now without bolus. Given ongoing stroke workup will aim for lower end of goal initially.   Heparin level >1.1 (affected by apixaban), aPTT 63 secslightly lower than goal  on heparin at 1450 units/hr. No bleeding noted.  Goal  of Therapy:  Heparin level 0.3-0.5 units/ml aPTT 66-85 seconds Monitor platelets by anticoagulation protocol: Yes   Plan:  Increase heparin to 1500 units/hr Daily apTT, Heparin level and CBC   Bonnita Nasuti Pharm.D. CPP, BCPS Clinical Pharmacist [phone] 11/19/2020 3:49 PM   Please see amion for complete clinical pharmacist phone list 11/19/2020 3:08 PM  11/19/2020 3:08 PM

## 2020-11-19 NOTE — Progress Notes (Signed)
NAME:  Jean Stewart, MRN:  [id number], DOB:  1945/03/27, LOS: 2 ADMISSION DATE:  11/16/2020, CONSULTATION DATE: 11/17/2020 REFERRING MD: Dr. Christy Gentles, CHIEF COMPLAINT: Hypercarbia  History of Present Illness:  This is a 75 year old female, past medical history of morbid obesity, hypertension, arthritis, atrial fibrillation on Eliquis, presumed diagnosis of sleep apnea.  Patient presented as a evaluation for code stroke to the emergency department.  With her altered mental status she had an arterial blood gas that was obtained which showed a PCO2 of 99.  She was initially treated with IV Lasix and BiPAP.  However due to her mental status worsening she was intubated and placed on mechanical support.  Patient was initially admitted to the [facility] however now being intubated critical care was asked to consult for medical admission to the intensive care unit.  Patient was seen by neurology for code stroke she did have some focal findings of left gaze preference and reduced blink to threat on the right.  Therefore recommending repeat CT head imaging tomorrow.  Additionally recommending continued work-up for newfound AKI, continuation of Eliquis.  Pertinent  Medical History   Past Medical History:  Diagnosis Date   A-fib ([place])    Arthritis    COPD (chronic obstructive pulmonary disease) (HCC)    Hypertension    IBS (irritable bowel syndrome)    Liver failure (HCC)    Morbid obesity (HCC)    Neuropathy    Peripheral vascular disease (HCC)    blood clots in legs   Physical deconditioning    Pre-diabetes    Suspected sleep apnea      Significant Hospital Events: Including procedures, antibiotic start and stop dates in addition to other pertinent events   11/17/20: Intubation, medical ICU admission  Interim History / Subjective:   On fentanyl 275; awake and following commands Neo 70 Amio and heparin drip in place w/ HR 110s Remains intubated on PRVC; low tidal volume  when switched to PSV  Objective   Blood pressure 134/72, pulse (!) 101, temperature 98.8 F (37.1 C), temperature source Axillary, resp. rate (!) 22, height '5\' 5"'$  (1.651 m), weight (!) 173 kg, SpO2 98 %.    Vent Mode: CPAP;PSV FiO2 (%):  [40 %] 40 % Set Rate:  [22 bmp] 22 bmp Vt Set:  [450 mL] 450 mL PEEP:  [5 cmH20] 5 cmH20 Pressure Support:  [14 cmH20] 14 cmH20 Plateau Pressure:  [18 cmH20-25 cmH20] 18 cmH20   Intake/Output Summary (Last 24 hours) at 11/19/2020 0904 Last data filed at 11/19/2020 0800 Gross per 24 hour  Intake 4401.39 ml  Output 1295 ml  Net 3106.39 ml    Filed Weights   11/17/20 0527 11/18/20 0433 11/19/20 0500  Weight: (!) 160.6 kg (!) 166.5 kg (!) 173 kg    Examination: General:  critically ill appearing on mech vent HEENT: MM pink/moist; ETT in place Neuro: AO on sedation CV: s1s2, afib rvr with rate 110s on amio drip, no m/r/g appreciated PULM:  dim clear BS bilaterally; on mech vent PRVC 40% 5 peep GI: soft, bsx4 active  Extremities: warm/dry, L fem central line in place Skin: no rashes or lesions   Labs: WBC 9.8 (10.9) BUN 29 Creat 1.75 (2.24) Lactic 2.5 (3.1)   Resolved Hospital Problem list     Assessment & Plan:   Acute on chronic hypercapnic respiratory failure requiring intubation mechanical ventilation - Which I suspect is related to her morbid obesity, likely OSA/OHS Hx of COPD P: -continue PRVC  6-8 cc/kg -wean fio2 for sats >92% -VAP prevention in place -continue duoneb q6 -daily SAT/SBT -will attempt to switch sedation to precedex and wean off fentanyl and reattempt SBT around 11am today  -will likely need BiPAP/CPAP when able to extubate for likely OSA/OHS  Acute metabolic encephalopathy: [id number] EEG indicative of moderate encephalopathy  CO2 narcosis Possible CVA: EEG moderate diffuse encephalopathy P: -neuro following -limit sedation for neuro checks - will start on precedex and wean off fentanyl for RASS 0 to  -1  Shock: likely sedation related P: -continue to wean neo for MAP goal >65 -follow Bcx2 and sputum culture -continue midodrine  Atrial fibrillation w/RVR Elevated L ventricular End-diastolic pressure: Echo 123456: EF 55-60% P: -continue heparin and amio -metoprolol on hold due to hypotension -continue telemetry monitoring -hold home lasix due to hypotension  AKI Hyperkalemia: improved P: -continue to monitor BMP/UOP -avoid nephrotoxic drugs; renal dose meds  Heterogenous thyroid:  -outpt f/u but recommend thyroid u/s   Best Practice (right click and "Reselect all SmartList Selections" daily)   Diet/type: NPO DVT prophylaxis: systemic heparin GI prophylaxis: PPI Lines: Central line and Arterial Line Foley:  N/A Code Status:  full code Last date of multidisciplinary goals of care discussion [9/14: updated daughter over phone] Recent Labs  Lab 11/18/20 1605 11/18/20 2002 11/19/20 0033 11/19/20 0400 11/19/20 0753  GLUCAP 128* 148* 129* 121* 139*     Critical Care Time: 35 minutes  JD Rexene Agent [facility] 11/19/2020, 9:04 AM  Please see [email or web address] for pager details.  From 7A-7P if no response, please call [phone]. After hours, please call ELink [phone].

## 2020-11-20 ENCOUNTER — Inpatient Hospital Stay (HOSPITAL_COMMUNITY): Payer: Medicare Other

## 2020-11-20 DIAGNOSIS — J9622 Acute and chronic respiratory failure with hypercapnia: Secondary | ICD-10-CM | POA: Diagnosis not present

## 2020-11-20 DIAGNOSIS — J9621 Acute and chronic respiratory failure with hypoxia: Secondary | ICD-10-CM | POA: Diagnosis not present

## 2020-11-20 LAB — CBC
HCT: 33.3 % — ABNORMAL LOW (ref 36.0–46.0)
Hemoglobin: 10.9 g/dL — ABNORMAL LOW (ref 12.0–15.0)
MCH: 32.5 pg (ref 26.0–34.0)
MCHC: 32.7 g/dL (ref 30.0–36.0)
MCV: 99.4 fL (ref 80.0–100.0)
Platelets: 146 10*3/uL — ABNORMAL LOW (ref 150–400)
RBC: 3.35 MIL/uL — ABNORMAL LOW (ref 3.87–5.11)
RDW: 14.7 % (ref 11.5–15.5)
WBC: 7.5 10*3/uL (ref 4.0–10.5)
nRBC: 0 % (ref 0.0–0.2)

## 2020-11-20 LAB — BASIC METABOLIC PANEL
Anion gap: 13 (ref 5–15)
BUN: 28 mg/dL — ABNORMAL HIGH (ref 8–23)
CO2: 27 mmol/L (ref 22–32)
Calcium: 8.8 mg/dL — ABNORMAL LOW (ref 8.9–10.3)
Chloride: 99 mmol/L (ref 98–111)
Creatinine, Ser: 1.56 mg/dL — ABNORMAL HIGH (ref 0.44–1.00)
GFR, Estimated: 34 mL/min — ABNORMAL LOW (ref >60)
Glucose, Bld: 146 mg/dL — ABNORMAL HIGH (ref 70–99)
Potassium: 4.1 mmol/L (ref 3.5–5.1)
Sodium: 139 mmol/L (ref 135–145)

## 2020-11-20 LAB — POCT I-STAT 7, (LYTES, BLD GAS, ICA,H+H)
Acid-Base Excess: 3 mmol/L — ABNORMAL HIGH (ref 0.0–2.0)
Acid-Base Excess: 4 mmol/L — ABNORMAL HIGH (ref 0.0–2.0)
Bicarbonate: 27.7 mmol/L (ref 20.0–28.0)
Bicarbonate: 30.5 mmol/L — ABNORMAL HIGH (ref 20.0–28.0)
Calcium, Ion: 1.18 mmol/L (ref 1.15–1.40)
Calcium, Ion: 1.2 mmol/L (ref 1.15–1.40)
HCT: 32 % — ABNORMAL LOW (ref 36.0–46.0)
HCT: 32 % — ABNORMAL LOW (ref 36.0–46.0)
Hemoglobin: 10.9 g/dL — ABNORMAL LOW (ref 12.0–15.0)
Hemoglobin: 10.9 g/dL — ABNORMAL LOW (ref 12.0–15.0)
O2 Saturation: 96 %
O2 Saturation: 98 %
Patient temperature: 98.3
Patient temperature: 99.1
Potassium: 4.1 mmol/L (ref 3.5–5.1)
Potassium: 4.3 mmol/L (ref 3.5–5.1)
Sodium: 139 mmol/L (ref 135–145)
Sodium: 137 mmol/L (ref 135–145)
TCO2: 29 mmol/L (ref 22–32)
TCO2: 32 mmol/L (ref 22–32)
pCO2 arterial: 42.3 mmHg (ref 32.0–48.0)
pCO2 arterial: 55.1 mmHg — ABNORMAL HIGH (ref 32.0–48.0)
pH, Arterial: 7.422 (ref 7.350–7.450)
pH, Arterial: 7.353 (ref 7.350–7.450)
pO2, Arterial: 78 mmHg — ABNORMAL LOW (ref 83.0–108.0)
pO2, Arterial: 105 mmHg (ref 83.0–108.0)

## 2020-11-20 LAB — MAGNESIUM: Magnesium: 2.1 mg/dL (ref 1.7–2.4)

## 2020-11-20 LAB — VITAMIN B1: Vitamin B1 (Thiamine): 119.1 nmol/L (ref 66.5–200.0)

## 2020-11-20 LAB — GLUCOSE, CAPILLARY
Glucose-Capillary: 107 mg/dL — ABNORMAL HIGH (ref 70–99)
Glucose-Capillary: 118 mg/dL — ABNORMAL HIGH (ref 70–99)
Glucose-Capillary: 118 mg/dL — ABNORMAL HIGH (ref 70–99)
Glucose-Capillary: 126 mg/dL — ABNORMAL HIGH (ref 70–99)
Glucose-Capillary: 133 mg/dL — ABNORMAL HIGH (ref 70–99)
Glucose-Capillary: 143 mg/dL — ABNORMAL HIGH (ref 70–99)
Glucose-Capillary: 160 mg/dL — ABNORMAL HIGH (ref 70–99)

## 2020-11-20 LAB — HEPARIN LEVEL (UNFRACTIONATED): Heparin Unfractionated: >1.1 IU/mL — ABNORMAL HIGH (ref 0.30–0.70)

## 2020-11-20 LAB — CULTURE, RESPIRATORY W GRAM STAIN: Culture: NORMAL

## 2020-11-20 LAB — APTT: aPTT: 74 seconds — ABNORMAL HIGH (ref 24–36)

## 2020-11-20 MED ORDER — SODIUM CHLORIDE 0.9 % IV SOLN
1.0000 g | INTRAVENOUS | Status: AC
  Administered 2020-11-20 – 2020-11-24 (×5): 1 g via INTRAVENOUS
  Filled 2020-11-20 (×5): qty 10

## 2020-11-20 MED ORDER — DIGOXIN 0.25 MG/ML IJ SOLN
0.2500 mg | Freq: Once | INTRAMUSCULAR | Status: AC
  Administered 2020-11-20: 0.25 mg via INTRAVENOUS
  Filled 2020-11-20: qty 2

## 2020-11-20 MED ORDER — AMIODARONE IV BOLUS ONLY 150 MG/100ML
150.0000 mg | Freq: Once | INTRAVENOUS | Status: AC
  Administered 2020-11-20: 150 mg via INTRAVENOUS
  Filled 2020-11-20: qty 100

## 2020-11-20 MED ORDER — FUROSEMIDE 10 MG/ML IJ SOLN
60.0000 mg | Freq: Four times a day (QID) | INTRAMUSCULAR | Status: AC
  Administered 2020-11-20 (×2): 60 mg via INTRAVENOUS
  Filled 2020-11-20 (×2): qty 6

## 2020-11-20 MED ORDER — ORAL CARE MOUTH RINSE
15.0000 mL | Freq: Two times a day (BID) | OROMUCOSAL | Status: DC
  Administered 2020-11-20 – 2020-11-27 (×14): 15 mL via OROMUCOSAL

## 2020-11-20 MED ORDER — DIGOXIN 0.25 MG/ML IJ SOLN
0.5000 mg | Freq: Once | INTRAMUSCULAR | Status: AC
  Administered 2020-11-20: 0.5 mg via INTRAVENOUS
  Filled 2020-11-20: qty 2

## 2020-11-20 NOTE — Progress Notes (Signed)
[place] for heparin Indication: atrial fibrillation  No Known Allergies  Patient Measurements: Height: '5\' 5"'$  (165.1 cm) Weight:  (unable to obtain weight. Patient was zeroed while in the bed.) IBW/kg (Calculated) : 57 Heparin Dosing Weight: 103 kg  Vital Signs: Temp: 98.4 F (36.9 C) (09/16 0736) Temp Source: Axillary (09/16 0736) BP: 116/68 (09/16 0700) Pulse Rate: 105 (09/16 0700)  Labs: Recent Labs    11/18/20 0343 11/18/20 1058 11/18/20 1440 11/19/20 0400 11/19/20 1244 11/20/20 0435 11/20/20 0812  HGB 11.4*  --   --  10.2*  --  10.9* 10.9*  HCT 34.5*  --   --  31.2*  --  33.3* 32.0*  PLT 191  --   --  150  --  146*  --   APTT 47*  --    < > 89* 63* 74*  --   HEPARINUNFRC >1.10*  --   --  >1.10*  --  >1.10*  --   CREATININE  --  2.18*  --  1.75*  --  1.56*  --    < > = values in this interval not displayed.     Estimated Creatinine Clearance: 49.3 mL/min (A) (by C-G formula based on SCr of 1.56 mg/dL (H)).   Medical History: Past Medical History:  Diagnosis Date   A-fib ([place])    Arthritis    COPD (chronic obstructive pulmonary disease) (HCC)    Hypertension    IBS (irritable bowel syndrome)    Liver failure (HCC)    Morbid obesity (HCC)    Neuropathy    Peripheral vascular disease (HCC)    blood clots in legs   Physical deconditioning    Pre-diabetes    Suspected sleep apnea    Assessment: 75 year old female with history of afib presents to [facility] from nursing facility with AMS and respiratory distress. Per recent refill history from 11/12/20 it appears patient has been receiving apixaban for her afib even though warfarin is also on her list. Heparin started while undergoing stroke workup.  Heparin level still altered by DOAC, aPTT is therapeutic at 74 seconds, CBC stable.  Goal of Therapy:  Heparin level 0.3-0.5 units/ml aPTT 66-85 seconds Monitor platelets by anticoagulation protocol: Yes   Plan:  Continue  heparin 1500 units/h Daily aPTT, heparin level, CBC  Arrie Senate, PharmD, [place], [facility] Clinical Pharmacist [phone] Please check AMION for all [place] numbers 11/20/2020

## 2020-11-20 NOTE — Progress Notes (Signed)
PT placed on bipap for the night. 

## 2020-11-20 NOTE — Evaluation (Signed)
Occupational Therapy Evaluation Patient Details Name: Jean Stewart MRN: [id number] DOB: 1945/03/30 Today's Date: 11/20/2020   History of Present Illness Pt is a8 year old woman admitted from SNF on 11/17/20 as code stroke with AMS and L side gaze. Found to have PC02 of 99. Mental status worsened requring intubation from 11/17/20 to 11/20/20. Head CT negative for acute changes. Hospital course complicated by AKI and hyperkalemia. PMH: obesity, afib, CHF, anemia, suspected sleep apnea, IBS, liver failure, PVD, neuropathy, pre-diabetes, COPD, arthritis.   Clinical Impression   Pt on bipap during assessment, difficult to gather prior level of function, but pt is known to have been admitted from SNF and she reports she has not walked in 2 months. She followed simple commands. Suspect pt was assisted for bathing, dressing and toileting. Pt presents with generalized weakness and grimaced with bed mobility and supported sitting, but denied pain when in supine. Pt currently requires +2 max assist for bed mobility and requires significant support in sitting. She is dependent in all ADL. Will follow acutely. Recommending further rehab in SNF.     Recommendations for follow up therapy are one component of a multi-disciplinary discharge planning process, led by the attending physician.  Recommendations may be updated based on patient status, additional functional criteria and insurance authorization.   Follow Up Recommendations  SNF;Supervision/Assistance - 24 hour    Equipment Recommendations  Other (comment) (defer to next venue)    Recommendations for Other Services       Precautions / Restrictions Precautions Precautions: Fall Precaution Comments: bipap      Mobility Bed Mobility Overal bed mobility: Needs Assistance Bed Mobility: Supine to Sit;Sit to Supine     Supine to sit: +2 for physical assistance;Max assist Sit to supine: +2 for physical assistance;Max assist   General bed  mobility comments: pt able to assist with LEs toward EOB and to raise trunk using bed rail, guided trunk and assisted LEs back into bed, +3 total to pull up in bed    Transfers                 General transfer comment: deferred, will require lift equipment    Balance Overall balance assessment: Needs assistance Sitting-balance support: Bilateral upper extremity supported;Feet unsupported Sitting balance-Leahy Scale: Zero Sitting balance - Comments: sat in bed with LEs partially dangling x 7 minutes Postural control: Posterior lean                                 ADL either performed or assessed with clinical judgement   ADL                                         General ADL Comments: requires total assist     Vision Baseline Vision/History: 1 Wears glasses Patient Visual Report: No change from baseline       Perception     Praxis      Pertinent Vitals/Pain Pain Assessment: Faces Faces Pain Scale: Hurts a little bit Pain Location: generalized Pain Descriptors / Indicators: Grimacing Pain Intervention(s): Monitored during session;Repositioned     Hand Dominance Right   Extremity/Trunk Assessment Upper Extremity Assessment Upper Extremity Assessment: Generalized weakness (3/5 shoulders, 4/5 elbows to hand)   Lower Extremity Assessment Lower Extremity Assessment: Defer to PT evaluation   Cervical / Trunk Assessment  Cervical / Trunk Assessment: Other exceptions Cervical / Trunk Exceptions: weakness, obesity   Communication Communication Communication: Other (comment) (on bipap)   Cognition Arousal/Alertness: Awake/alert Behavior During Therapy: Flat affect Overall Cognitive Status: Difficult to assess                                 General Comments: difficult to assess fully due to bipap, pt answering questions using short phrases and following commands   General Comments       Exercises     Shoulder  Instructions      Home Living Family/patient expects to be discharged to:: Skilled nursing facility                                 Additional Comments: pt admitted from SNF      Prior Functioning/Environment Level of Independence: Needs assistance  Gait / Transfers Assistance Needed: reports she has not walked in 2 months ADL's / Homemaking Assistance Needed: assume pt assisted for bathing, dressing and toileting, likely self fed and groomed            OT Problem List: Decreased strength;Decreased activity tolerance;Impaired balance (sitting and/or standing);Decreased knowledge of use of DME or AE;Impaired UE functional use;Obesity      OT Treatment/Interventions: Self-care/ADL training;Patient/family education;Balance training;Therapeutic activities    OT Goals(Current goals can be found in the care plan section) Acute Rehab OT Goals Patient Stated Goal: agreeable to sitting at EOB, stating, "What do you want me to do?" OT Goal Formulation: Patient unable to participate in goal setting Time For Goal Achievement: 12/04/20 Potential to Achieve Goals: Fair ADL Goals Pt Will Perform Grooming: with min assist;sitting Pt Will Perform Upper Body Bathing: with mod assist;sitting Pt Will Perform Upper Body Dressing: with min assist;sitting Pt/caregiver will Perform Home Exercise Program: Increased strength;Both right and left upper extremity;With minimal assist (AROM) Additional ADL Goal #1: Pt will perform bed mobility with moderate assistance in preparation for ADL. Additional ADL Goal #2: Pt will demonstrate fair sitting balance x 10 minutes in preparation for ADL.  OT Frequency: Min 2X/week   Barriers to D/C:            Co-evaluation PT/OT/SLP Co-Evaluation/Treatment: Yes Reason for Co-Treatment: Complexity of the patient's impairments (multi-system involvement);For patient/therapist safety PT goals addressed during session: Mobility/safety with  mobility;Balance;Proper use of DME;Strengthening/ROM OT goals addressed during session: Strengthening/ROM      AM-PAC OT "6 Clicks" Daily Activity     Outcome Measure Help from another person eating meals?: Total Help from another person taking care of personal grooming?: Total Help from another person toileting, which includes using toliet, bedpan, or urinal?: Total Help from another person bathing (including washing, rinsing, drying)?: Total Help from another person to put on and taking off regular upper body clothing?: Total Help from another person to put on and taking off regular lower body clothing?: Total 6 Click Score: 6   End of Session Equipment Utilized During Treatment: Other (comment) (bipap)  Activity Tolerance: Patient tolerated treatment well Patient left: in bed;with call bell/phone within reach;with nursing/sitter in room  OT Visit Diagnosis: Muscle weakness (generalized) (M62.81)                Time: [id number] OT Time Calculation (min): 34 min Charges:  OT General Charges $OT Visit: 1 Visit OT Evaluation $OT Eval Moderate Complexity: 1  Mod  Jean Stewart, OTR/L Acute Rehabilitation Services Pager: [phone] Office: [phone]   Jean Stewart 11/20/2020, 2:52 PM

## 2020-11-20 NOTE — Procedures (Signed)
Extubation Procedure Note  Patient Details:   Name: Jean Stewart DOB: 1945-05-12 MRN: [id number]   Airway Documentation:    Vent end date: 11/20/20 Vent end time: 0955   Evaluation  O2 sats: stable throughout Complications: No apparent complications Patient did tolerate procedure well. Bilateral Breath Sounds: Diminished   Yes  Patient was extubated to BIPAP per CCM order without any complications, dyspnea or stridor noted. Positive cuff leak prior to extubation.   Claretta Fraise 11/20/2020, 09:55 AM

## 2020-11-20 NOTE — Progress Notes (Addendum)
NAME:  Jean Stewart, MRN:  [id number], DOB:  September 19, 1945, LOS: 3 ADMISSION DATE:  11/16/2020, CONSULTATION DATE: 11/17/2020 REFERRING MD: Dr. Christy Gentles, CHIEF COMPLAINT: Hypercarbia  History of Present Illness:  This is a 75 year old female, past medical history of morbid obesity, hypertension, arthritis, atrial fibrillation on Eliquis, presumed diagnosis of sleep apnea.  Patient presented as a evaluation for code stroke to the emergency department.  With her altered mental status she had an arterial blood gas that was obtained which showed a PCO2 of 99.  She was initially treated with IV Lasix and BiPAP.  However due to her mental status worsening she was intubated and placed on mechanical support.  Patient was initially admitted to the [facility] however now being intubated critical care was asked to consult for medical admission to the intensive care unit.  Patient was seen by neurology for code stroke she did have some focal findings of left gaze preference and reduced blink to threat on the right.  Therefore recommending repeat CT head imaging tomorrow.  Additionally recommending continued work-up for newfound AKI, continuation of Eliquis.  Pertinent  Medical History   Past Medical History:  Diagnosis Date   A-fib ([place])    Arthritis    COPD (chronic obstructive pulmonary disease) (HCC)    Hypertension    IBS (irritable bowel syndrome)    Liver failure (HCC)    Morbid obesity (HCC)    Neuropathy    Peripheral vascular disease (HCC)    blood clots in legs   Physical deconditioning    Pre-diabetes    Suspected sleep apnea      Significant Hospital Events: Including procedures, antibiotic start and stop dates in addition to other pertinent events   11/17/20: Intubation, medical ICU admission 9/16: failing Sbt's due to low tidal volume; on precedex; off neo  Interim History / Subjective:  Switched to precedex yesterday from fentanyl Awake and following commands Off  Neo On amio and heparin w/ HR 110s Remains on PRVC; attempted PSV 15/5 w/ tidal volume low 200s.  Objective   Blood pressure 116/68, pulse (!) 105, temperature 98.1 F (36.7 C), temperature source Oral, resp. rate (!) 6, height '5\' 5"'$  (1.651 m), weight (!) 173 kg, SpO2 99 %.    Vent Mode: PRVC FiO2 (%):  [40 %] 40 % Set Rate:  [22 bmp] 22 bmp Vt Set:  [450 mL] 450 mL PEEP:  [5 cmH20] 5 cmH20 Pressure Support:  [14 cmH20] 14 cmH20 Plateau Pressure:  [20 cmH20-22 cmH20] 22 cmH20   Intake/Output Summary (Last 24 hours) at 11/20/2020 0708 Last data filed at 11/20/2020 0700 Gross per 24 hour  Intake 3274.63 ml  Output 935 ml  Net 2339.63 ml    Filed Weights   11/17/20 0527 11/18/20 0433 11/19/20 0500  Weight: (!) 160.6 kg (!) 166.5 kg (!) 173 kg    Examination: General:  critically ill appearing on mech vent HEENT: MM pink/moist; ETT in place Neuro: AO and following commands CV: s1s2, afib rvr with rate 110s on amio drip, no m/r/g appreciated PULM:  dim clear BS bilaterally; on mech vent PRVC 40% 5 peep GI: soft, bsx4 active  Extremities: warm/dry, L fem central line in place Skin: no rashes or lesions   Labs: BUN 28 from 29 Creat 1.56 from 1.75 Lactic 2.5 (3.1)  CXR: worsening bilateral infiltrates; worse on RLL  Resolved Hospital Problem list     Assessment & Plan:   Acute on chronic hypercapnic respiratory failure requiring intubation  mechanical ventilation - Which I suspect is related to her morbid obesity, likely OSA/OHS Hx of COPD P: -will check ABG this am -continue PRVC 6-8 cc/kg -wean fio2 for sats >92% -VAP prevention in place -continue duoneb q6 -daily SAT/SBT -will start on ceftriaxone x 5 days for worsening CXR this am -send trach aspirate -will reattempt SBT later today -will likely need BiPAP/CPAP when able to extubate for likely OSA/OHS  Acute metabolic encephalopathy: Q000111Q EEG indicative of moderate encephalopathy  CO2 narcosis Possible  CVA: EEG moderate diffuse encephalopathy P: -neuro following -limit sedation for neuro checks - continue precedex for RASS 0 to -1  Shock: likely sedation related P: -off neo -follow Bcx2 and sputum culture -continue midodrine  Atrial fibrillation w/RVR Elevated L ventricular End-diastolic pressure: Echo 123456: EF 55-60% P: -continue heparin and amio -continue to hold metoprolol and lasix -continue telemetry monitoring  AKI Hyperkalemia: improved P: -slowly improving -continue to monitor BMP/UOP -avoid nephrotoxic drugs; renal dose meds  Heterogenous thyroid:  -outpt f/u but recommend thyroid u/s   Best Practice (right click and "Reselect all SmartList Selections" daily)   Diet/type: NPO DVT prophylaxis: systemic heparin GI prophylaxis: PPI Lines: Central line and Arterial Line Foley:  N/A Code Status:  full code Last date of multidisciplinary goals of care discussion [9/14: updated daughter over phone] Recent Labs  Lab 11/19/20 1131 11/19/20 1624 11/19/20 1947 11/20/20 0001 11/20/20 0416  GLUCAP 132* 143* 153* 143* 133*     Critical Care Time: 35 minutes  JD Rexene Agent [place] [facility] [facility] 11/20/2020, 7:08 AM  Please see [email or web address] for pager details.  From 7A-7P if no response, please call [phone]. After hours, please call ELink [phone].

## 2020-11-20 NOTE — Evaluation (Signed)
Physical Therapy Evaluation Patient Details Name: Jean Stewart MRN: [id number] DOB: 05/12/1945 Today's Date: 11/20/2020  History of Present Illness  Pt is a61 year old woman admitted from SNF on 11/17/20 as code stroke with AMS and L side gaze. Found to have PC02 of 99. Mental status worsened requring intubation from 11/17/20 to 11/20/20. Head CT negative for acute changes. Hospital course complicated by AKI and hyperkalemia. PMH: obesity, afib, CHF, anemia, suspected sleep apnea, IBS, liver failure, PVD, neuropathy, pre-diabetes, COPD, arthritis.   Clinical Impression  Pt in bed upon arrival of PT, agreeable to evaluation at this time. Prior to admission the pt was primarily bed bound, but reports she has been able to walk short distances with use of a RW "recently". The pt now presents with limitations in functional mobility, activity tolerance, strength, and stability due to above dx, and will continue to benefit from skilled PT to address these deficits. The pt required maxA of 2-3 to safely complete transition to sitting EOB given lines/leads and bipap at time of eval. The pt was able to assist some with initiation of movements in BLE and use of LUE to pull on bed rails, but required maxA of 1 to maintain seated position due to poor core strength and seated stability. HR to max 140s with activity, SpO2 to 88 with good pleth, RN present and managing bipap. Will continue to benefit from skilled PT to progress activity tolerance and attempt OOB transfers/mobility. Recommend return to SNF when medically stable, but will benefit from rehab improve activity tolerance and return to mobility.         Recommendations for follow up therapy are one component of a multi-disciplinary discharge planning process, led by the attending physician.  Recommendations may be updated based on patient status, additional functional criteria and insurance authorization.  Follow Up Recommendations  SNF;Supervision/Assistance - 24 hour    Equipment Recommendations   (defer to post acute)    Recommendations for Other Services       Precautions / Restrictions Precautions Precautions: Fall Precaution Comments: bipap Restrictions Weight Bearing Restrictions: No      Mobility  Bed Mobility Overal bed mobility: Needs Assistance Bed Mobility: Supine to Sit;Sit to Supine     Supine to sit: +2 for physical assistance;Max assist Sit to supine: +2 for physical assistance;Max assist   General bed mobility comments: pt able to assist with LEs toward EOB and to raise trunk using bed rail, guided trunk and assisted LEs back into bed, +3 total to pull up in bed    Transfers                 General transfer comment: deferred, will require lift equipment        Balance Overall balance assessment: Needs assistance Sitting-balance support: Bilateral upper extremity supported;Feet unsupported Sitting balance-Leahy Scale: Zero Sitting balance - Comments: sat in bed with LEs partially dangling x 7 minutes Postural control: Posterior lean                                   Pertinent Vitals/Pain Pain Assessment: Faces Faces Pain Scale: Hurts a little bit Pain Location: generalized, pt states no pain, grimacing noted during mobility Pain Descriptors / Indicators: Grimacing Pain Intervention(s): Limited activity within patient's tolerance;Monitored during session;Repositioned    Home Living Family/patient expects to be discharged to:: Skilled nursing facility  Additional Comments: pt admitted from SNF    Prior Function Level of Independence: Needs assistance   Gait / Transfers Assistance Needed: reports she has not walked in 2 months  ADL's / Homemaking Assistance Needed: assume pt assisted for bathing, dressing and toileting, likely self fed and groomed        Hand Dominance   Dominant Hand: Right    Extremity/Trunk  Assessment   Upper Extremity Assessment Upper Extremity Assessment: Defer to OT evaluation    Lower Extremity Assessment Lower Extremity Assessment: RLE deficits/detail;LLE deficits/detail RLE Deficits / Details: pt able to demo good initiation of movement at ankle, knee, and hip in gravity minimized position, unable to achieve full ROM against gravity due to lack of strength and body habitus. pt denies difference in sensation RLE Sensation: WNL LLE Deficits / Details: pt able to initiate movement at ankle, knee, and hip, tremors noted in LLE. unable to complete movement against gravity due to weakness and body habitus LLE Sensation: WNL    Cervical / Trunk Assessment Cervical / Trunk Assessment: Other exceptions Cervical / Trunk Exceptions: weakness, obesity  Communication   Communication: Other (comment) (on bipap at eval)  Cognition Arousal/Alertness: Awake/alert Behavior During Therapy: Flat affect Overall Cognitive Status: Difficult to assess                                 General Comments: difficult to assess fully due to bipap, pt answering questions using short phrases and following commands      General Comments General comments (skin integrity, edema, etc.): Pt on bipap through session, SpO2 drop to 88 with good pleth with transition to sitting HR max of 145 with seated activity.    Exercises     Assessment/Plan    PT Assessment Patient needs continued PT services  PT Problem List Decreased strength;Decreased range of motion;Decreased activity tolerance;Decreased balance;Decreased mobility;Pain       PT Treatment Interventions DME instruction;Gait training;Stair training;Functional mobility training;Therapeutic activities;Therapeutic exercise;Balance training;Neuromuscular re-education;Patient/family education    PT Goals (Current goals can be found in the Care Plan section)  Acute Rehab PT Goals Patient Stated Goal: agreeable to sitting at EOB,  stating, "What do you want me to do?" PT Goal Formulation: With patient Time For Goal Achievement: 12/04/20 Potential to Achieve Goals: Good    Frequency Min 3X/week   Barriers to discharge        Co-evaluation PT/OT/SLP Co-Evaluation/Treatment: Yes Reason for Co-Treatment: Complexity of the patient's impairments (multi-system involvement);Necessary to address cognition/behavior during functional activity;For patient/therapist safety;To address functional/ADL transfers PT goals addressed during session: Mobility/safety with mobility;Balance;Strengthening/ROM;Proper use of DME OT goals addressed during session: Strengthening/ROM       AM-PAC PT "6 Clicks" Mobility  Outcome Measure Help needed turning from your back to your side while in a flat bed without using bedrails?: Total Help needed moving from lying on your back to sitting on the side of a flat bed without using bedrails?: Total Help needed moving to and from a bed to a chair (including a wheelchair)?: Total Help needed standing up from a chair using your arms (e.g., wheelchair or bedside chair)?: Total Help needed to walk in hospital room?: Total Help needed climbing 3-5 steps with a railing? : Total 6 Click Score: 6    End of Session Equipment Utilized During Treatment: Oxygen Activity Tolerance: Patient tolerated treatment well;Patient limited by fatigue Patient left: in bed;with call bell/phone within reach;with  nursing/sitter in room Nurse Communication: Mobility status PT Visit Diagnosis: Other abnormalities of gait and mobility (R26.89);Muscle weakness (generalized) (M62.81)    Time: 1240-1316 PT Time Calculation (min) (ACUTE ONLY): 36 min   Charges:   PT Evaluation $PT Eval Moderate Complexity: 1 Mod          West Carbo, PT, DPT   Acute Rehabilitation Department Pager #: [phone]  Sandra Cockayne 11/20/2020, 4:03 PM

## 2020-11-21 ENCOUNTER — Inpatient Hospital Stay (HOSPITAL_COMMUNITY): Payer: Medicare Other

## 2020-11-21 DIAGNOSIS — J9622 Acute and chronic respiratory failure with hypercapnia: Secondary | ICD-10-CM | POA: Diagnosis not present

## 2020-11-21 DIAGNOSIS — J9621 Acute and chronic respiratory failure with hypoxia: Secondary | ICD-10-CM | POA: Diagnosis not present

## 2020-11-21 LAB — CBC
HCT: 33.5 % — ABNORMAL LOW (ref 36.0–46.0)
Hemoglobin: 10.5 g/dL — ABNORMAL LOW (ref 12.0–15.0)
MCH: 32.6 pg (ref 26.0–34.0)
MCHC: 31.3 g/dL (ref 30.0–36.0)
MCV: 104 fL — ABNORMAL HIGH (ref 80.0–100.0)
Platelets: 148 10*3/uL — ABNORMAL LOW (ref 150–400)
RBC: 3.22 MIL/uL — ABNORMAL LOW (ref 3.87–5.11)
RDW: 14.8 % (ref 11.5–15.5)
WBC: 6 10*3/uL (ref 4.0–10.5)
nRBC: 0 % (ref 0.0–0.2)

## 2020-11-21 LAB — BASIC METABOLIC PANEL
Anion gap: 9 (ref 5–15)
BUN: 29 mg/dL — ABNORMAL HIGH (ref 8–23)
CO2: 30 mmol/L (ref 22–32)
Calcium: 8.6 mg/dL — ABNORMAL LOW (ref 8.9–10.3)
Chloride: 99 mmol/L (ref 98–111)
Creatinine, Ser: 1.6 mg/dL — ABNORMAL HIGH (ref 0.44–1.00)
GFR, Estimated: 33 mL/min — ABNORMAL LOW (ref >60)
Glucose, Bld: 110 mg/dL — ABNORMAL HIGH (ref 70–99)
Potassium: 4.9 mmol/L (ref 3.5–5.1)
Sodium: 138 mmol/L (ref 135–145)

## 2020-11-21 LAB — GLUCOSE, CAPILLARY
Glucose-Capillary: 74 mg/dL (ref 70–99)
Glucose-Capillary: 84 mg/dL (ref 70–99)
Glucose-Capillary: 86 mg/dL (ref 70–99)
Glucose-Capillary: 88 mg/dL (ref 70–99)
Glucose-Capillary: 94 mg/dL (ref 70–99)
Glucose-Capillary: 95 mg/dL (ref 70–99)

## 2020-11-21 LAB — APTT
aPTT: 43 seconds — ABNORMAL HIGH (ref 24–36)
aPTT: 42 seconds — ABNORMAL HIGH (ref 24–36)

## 2020-11-21 LAB — TRIGLYCERIDES: Triglycerides: 51 mg/dL (ref <150)

## 2020-11-21 LAB — HEPARIN LEVEL (UNFRACTIONATED): Heparin Unfractionated: 0.86 IU/mL — ABNORMAL HIGH (ref 0.30–0.70)

## 2020-11-21 MED ORDER — DOCUSATE SODIUM 100 MG PO CAPS
100.0000 mg | ORAL_CAPSULE | Freq: Two times a day (BID) | ORAL | Status: DC
  Administered 2020-11-21 – 2020-11-27 (×12): 100 mg via ORAL
  Filled 2020-11-21 (×13): qty 1

## 2020-11-21 MED ORDER — POLYETHYLENE GLYCOL 3350 17 G PO PACK
17.0000 g | PACK | Freq: Every day | ORAL | Status: DC
  Administered 2020-11-21 – 2020-11-27 (×5): 17 g via ORAL
  Filled 2020-11-21 (×7): qty 1

## 2020-11-21 MED ORDER — MIDODRINE HCL 5 MG PO TABS
10.0000 mg | ORAL_TABLET | Freq: Three times a day (TID) | ORAL | Status: DC
  Administered 2020-11-21 – 2020-11-22 (×6): 10 mg via ORAL
  Filled 2020-11-21 (×6): qty 2

## 2020-11-21 MED ORDER — FUROSEMIDE 10 MG/ML IJ SOLN
60.0000 mg | Freq: Four times a day (QID) | INTRAMUSCULAR | Status: AC
Start: 2020-11-21 — End: 2020-11-21
  Administered 2020-11-21 (×3): 60 mg via INTRAVENOUS
  Filled 2020-11-21 (×3): qty 6

## 2020-11-21 MED ORDER — METOPROLOL TARTRATE 25 MG PO TABS
25.0000 mg | ORAL_TABLET | Freq: Three times a day (TID) | ORAL | Status: DC
  Administered 2020-11-21 – 2020-11-25 (×13): 25 mg via ORAL
  Filled 2020-11-21 (×13): qty 1

## 2020-11-21 MED ORDER — DEXTROSE 5 % IV SOLN: INTRAVENOUS | Status: AC

## 2020-11-21 MED ORDER — METOPROLOL TARTRATE 5 MG/5ML IV SOLN: 5.0000 mg | INTRAVENOUS | Status: DC | PRN

## 2020-11-21 MED ORDER — IPRATROPIUM-ALBUTEROL 0.5-2.5 (3) MG/3ML IN SOLN
3.0000 mL | Freq: Three times a day (TID) | RESPIRATORY_TRACT | Status: DC
  Administered 2020-11-21 (×2): 3 mL via RESPIRATORY_TRACT
  Filled 2020-11-21 (×3): qty 3

## 2020-11-21 NOTE — Evaluation (Signed)
Clinical/Bedside Swallow Evaluation Patient Details  Name: Leonie Attar MRN: [id number] Date of Birth: 03/03/1946  Today's Date: 11/21/2020 Time: SLP Start Time (ACUTE ONLY): [id number] SLP Stop Time (ACUTE ONLY): 0920 SLP Time Calculation (min) (ACUTE ONLY): 45 min  Past Medical History:  Past Medical History:  Diagnosis Date   A-fib ([place])    Arthritis    COPD (chronic obstructive pulmonary disease) (HCC)    Hypertension    IBS (irritable bowel syndrome)    Liver failure (HCC)    Morbid obesity (HCC)    Neuropathy    Peripheral vascular disease (HCC)    blood clots in legs   Physical deconditioning    Pre-diabetes    Suspected sleep apnea    Past Surgical History:  Past Surgical History:  Procedure Laterality Date   EXTERNAL FIXATION LEG Left 12/03/2018   Procedure: EXTERNAL FIXATION LEG;  Surgeon: Shona Needles, MD;  Location: [place];  Service: Orthopedics;  Laterality: Left;   EXTERNAL FIXATION REMOVAL Left 01/25/2019   Procedure: REMOVAL EXTERNAL FIXATION LEG;  Surgeon: Shona Needles, MD;  Location: [place];  Service: Orthopedics;  Laterality: Left;   I & D EXTREMITY Left 12/03/2018   Procedure: IRRIGATION AND DEBRIDEMENT EXTREMITY;  Surgeon: Shona Needles, MD;  Location: [place];  Service: Orthopedics;  Laterality: Left;   ORIF ANKLE FRACTURE Left 12/03/2018   Procedure: OPEN REDUCTION INTERNAL FIXATION (ORIF) ANKLE FRACTURE;  Surgeon: Shona Needles, MD;  Location: [place];  Service: Orthopedics;  Laterality: Left;   HPI:  75 yo female adm to [facility] with mental status changes requiring intubation from 9/13-9/16/2022.  Head CT negative for acure changes.  Hospital coarse complicated by AKI, hyperkalemia, possible small cerebellar CVA.  Pt with h/o being bedbound, COPD, prediabetes, arthritis, ? sleep apnea, obesity, afib, IBS, liver failure, PVS, neuropathy.  Swallow eval ordered.  Pt denies h/o dysphagia -states she is on a heart healthy diet PTA.    Assessment / Plan /  Recommendation  Clinical Impression  Patient presents with functional oropharyngeal swallow ability based on clinical swallow evaluation.  No focal CN deficits, strong cough and voice and pt able to feed herself with set up assist. Pt easily passed 3 ounce [place] water screen and consumed pears, applesauce and apple juice during testing.  Mild wheeze noted initially that abated with further intake.   Pt admits to premorbid issues with wheezing with effort.  No clinical indications of aspiration with all po intake.    She did benefit from verbal cues to take small bites saying "Why are telling me what to do?"  Educated her to importance of swallow and aspiration precautions given she was on a vent for a few days which resultant potential acute swallow ability changes.    Provided swallow precaution signs using teach back to pt and posted one above the bed.  SLP to sign off - Thanks for this consult. SLP Visit Diagnosis: Dysphagia, unspecified (R13.10)    Aspiration Risk  Mild aspiration risk    Diet Recommendation Regular;Thin liquid   Liquid Administration via: Cup;Straw Medication Administration: Whole meds with liquid Supervision: Patient able to self feed Compensations: Slow rate;Small sips/bites Postural Changes: Seated upright at 90 degrees;Remain upright for at least 30 minutes after po intake    Other  Recommendations Oral Care Recommendations: Oral care BID    Recommendations for follow up therapy are one component of a multi-disciplinary discharge planning process, led by the attending physician.  Recommendations may be updated  based on patient status, additional functional criteria and insurance authorization.  Follow up Recommendations None      Frequency and Duration   N/a         Prognosis N/a   Swallow Study   General Date of Onset: 11/21/20 HPI: 75 yo female adm to [facility] with mental status changes requiring intubation from 9/13-9/16/2022.  Head CT negative for acure  changes.  Hospital coarse complicated by AKI, hyperkalemia, possible small cerebellar CVA.  Pt with h/o being bedbound, COPD, prediabetes, arthritis, ? sleep apnea, obesity, afib, IBS, liver failure, PVS, neuropathy.  Swallow eval ordered.  Pt denies h/o dysphagia -states she is on a heart healthy diet PTA. Type of Study: Bedside Swallow Evaluation Diet Prior to this Study: NPO Temperature Spikes Noted: No Respiratory Status: Nasal cannula History of Recent Intubation: Yes Length of Intubations (days): 4 days Date extubated: 11/20/20 Behavior/Cognition: Alert;Cooperative Oral Cavity Assessment: Dry Oral Care Completed by SLP: Recent completion by staff (RN reports pt had just received oral care) Self-Feeding Abilities: Needs set up Patient Positioning: Upright in bed Baseline Vocal Quality: Normal Volitional Cough: Strong Volitional Swallow: Able to elicit    Oral/Motor/Sensory Function Overall Oral Motor/Sensory Function: Within functional limits   Ice Chips Ice chips: Within functional limits Presentation: Spoon   Thin Liquid Thin Liquid: Within functional limits Presentation: Cup;Straw    Nectar Thick Nectar Thick Liquid: Not tested   Honey Thick Honey Thick Liquid: Not tested   Puree Puree: Within functional limits Presentation: Self Fed;Spoon   Solid     Solid: Within functional limits Presentation: Self Fredirick Lathe 11/21/2020,9:38 AM   Kathleen Lime, MS [place] Office [phone] Pager [phone]

## 2020-11-21 NOTE — Progress Notes (Signed)
ANTICOAGULATION CONSULT NOTE  Pharmacy Consult for heparin Indication: atrial fibrillation  No Known Allergies  Patient Measurements: Height: '5\' 5"'$  ([id number] cm) Weight: (!) 164 kg (361 lb 8.9 oz) IBW/kg (Calculated) : 57 Heparin Dosing Weight: 103 kg  Vital Signs: Temp: 98.6 F (37 C) (09/17 1215) Temp Source: Oral (09/17 1215) BP: 141/73 (09/17 1300) Pulse Rate: 77 (09/17 1300)  Labs: Recent Labs    11/19/20 0400 11/19/20 1244 11/20/20 0435 11/20/20 0812 11/20/20 1207 11/21/20 0407 11/21/20 1300  HGB 10.2*  --  10.9* 10.9* 10.9* 10.5*  --   HCT 31.2*  --  33.3* 32.0* 32.0* 33.5*  --   PLT 150  --  146*  --   --  148*  --   APTT 89*   < > 74*  --   --  43* 42*  HEPARINUNFRC >1.10*  --  >1.10*  --   --  0.86*  --   CREATININE 1.75*  --  1.56*  --   --  1.60*  --    < > = values in this interval not displayed.     Estimated Creatinine Clearance: 46.4 mL/min (A) (by C-G formula based on SCr of 1.6 mg/dL (H)).   Medical History: Past Medical History:  Diagnosis Date   A-fib ([place])    Arthritis    COPD (chronic obstructive pulmonary disease) (HCC)    Hypertension    IBS (irritable bowel syndrome)    Liver failure (HCC)    Morbid obesity (HCC)    Neuropathy    Peripheral vascular disease (HCC)    blood clots in legs   Physical deconditioning    Pre-diabetes    Suspected sleep apnea    Assessment: 75 year old female with history of afib presents to [facility] from nursing facility with AMS and respiratory distress. Per recent refill history from 11/12/20 it appears patient has been receiving apixaban for her afib even though warfarin is also on her list. Heparin started while undergoing stroke workup.  Repeat aPTT remains low, no infusion issues per nursing.  Goal of Therapy:  Heparin level 0.3-0.5 units/ml aPTT 66-85 seconds Monitor platelets by anticoagulation protocol: Yes   Plan:  Increase heparin 1800 units/hr Repeat heparin level and aPTT in am  Arrie Senate, PharmD, BCPS, [facility] Clinical Pharmacist [phone] Please check AMION for all [facility] numbers 11/21/2020

## 2020-11-21 NOTE — Progress Notes (Signed)
Pt transitioned off bipap at this time and placed on 4L nasal cannula with humidity. Pt tolerated well. RT will continue to monitor and be available.

## 2020-11-21 NOTE — Progress Notes (Signed)
NAME:  Jean Stewart, MRN:  [id number], DOB:  [birth year], LOS: 4 ADMISSION DATE:  11/16/2020, CONSULTATION DATE: 11/17/2020 REFERRING MD: Dr. Christy Gentles, CHIEF COMPLAINT: Hypercarbia  History of Present Illness:  This is a 75 year old female, past medical history of morbid obesity, hypertension, arthritis, atrial fibrillation on Eliquis, presumed diagnosis of sleep apnea.  Patient presented as a evaluation for code stroke to the emergency department.  With her altered mental status she had an arterial blood gas that was obtained which showed a PCO2 of 99.  She was initially treated with IV Lasix and BiPAP.  However due to her mental status worsening she was intubated and placed on mechanical support.  Patient was initially admitted to the [facility] hospitalist service however now being intubated critical care was asked to consult for medical admission to the intensive care unit.  Patient was seen by neurology for code stroke she did have some focal findings of left gaze preference and reduced blink to threat on the right.  Therefore recommending repeat CT head imaging tomorrow.  Additionally recommending continued work-up for newfound AKI, continuation of Eliquis.  Pertinent  Medical History   Past Medical History:  Diagnosis Date   A-fib ([place])    Arthritis    COPD (chronic obstructive pulmonary disease) (HCC)    Hypertension    IBS (irritable bowel syndrome)    Liver failure (HCC)    Morbid obesity (HCC)    Neuropathy    Peripheral vascular disease (HCC)    blood clots in legs   Physical deconditioning    Pre-diabetes    Suspected sleep apnea      Significant Hospital Events: Including procedures, antibiotic start and stop dates in addition to other pertinent events   11/17/20: Intubation, medical ICU admission 9/16: failing Sbt's due to low tidal volume; on precedex; off neo 9/17: extubated yesterday, doing well today off BIPAP  Interim History / Subjective:   Extubated yesterday, on  BIPAP  Objective   Blood pressure 116/67, pulse 71, temperature 98.3 F (36.8 C), temperature source Axillary, resp. rate 14, height '5\' 5"'$  (1.651 m), weight (!) 164 kg, SpO2 100 %.    Vent Mode: BIPAP FiO2 (%):  [40 %] 40 % Set Rate:  [12 bmp-18 bmp] 14 bmp PEEP:  [5 cmH20-6 cmH20] 6 cmH20 Pressure Support:  [6 cmH20-8 cmH20] 8 cmH20   Intake/Output Summary (Last 24 hours) at 11/21/2020 0755 Last data filed at 11/21/2020 0700 Gross per 24 hour  Intake 1403.69 ml  Output 3165 ml  Net -1761.31 ml   Filed Weights   11/18/20 0433 11/19/20 0500 11/21/20 0205  Weight: (!) 166.5 kg (!) 173 kg (!) 164 kg    Examination: General:  obese FM, on BIPAP HEENT: MMM dry, bipap in place  Neuro: AAOX3 CV: S1 S2, rates controlled, on amio ggt  PULM: diminshed BL, distant breath sounds GI: soft, nt nd  Extremities: minimal edema, warm dry  Skin: no rash   Labs: Scr 1.6  CXR: bilateral infiltrates    Resolved Hospital Problem list     Assessment & Plan:   Acute on chronic hypercapnic respiratory failure requiring intubation mechanical ventilation Acute diastolic heart failure  Bilateral pulmonary edema  - Which I suspect is related to her morbid obesity, likely OSA/OHS Hx of COPD P: - extubated to bipap yesterday  - doing well this morning, slept with BIPAP last night  - was on precedex for a short period  - complete abx x5 days with stop date ceftriaxone  -  OT PT - SLP to see today  - needs outpatient sleep consult   Acute metabolic encephalopathy: [id number] EEG indicative of moderate encephalopathy  CO2 narcosis Possible CVA: EEG moderate diffuse encephalopathy P: Resolved  Continue to monitor   Shock: likely sedation related P: -off neo -follow Bcx2 and sputum culture -continue midodrine  Atrial fibrillation w/RVR Elevated L ventricular End-diastolic pressure: Echo [id number]: EF 55-60% P: -continue heparin and amio -continue to hold metoprolol and lasix -continue  telemetry monitoring  AKI Hyperkalemia: improved P: -slowly improving -continue to monitor BMP/UOP -avoid nephrotoxic drugs; renal dose meds  Heterogenous thyroid:  -outpt f/u but recommend thyroid u/s   Best Practice (right click and "Reselect all SmartList Selections" daily)   Diet/type: NPO DVT prophylaxis: systemic heparin GI prophylaxis: PPI Lines: Central line and Arterial Line Foley:  N/A Code Status:  full code Last date of multidisciplinary goals of care discussion [9/14: updated daughter over phone]  Recent Labs  Lab 11/20/20 1541 11/20/20 2008 11/20/20 2349 11/21/20 0405 11/21/20 0747  GLUCAP 118* 118* 107* 95 94    Plan for transfer from the ICU for pick by [facility] tomorrow.   Garner Nash, DO [facility] 11/21/2020 7:55 AM

## 2020-11-21 NOTE — Progress Notes (Signed)
[facility] Progress Note Patient Name: Jean Stewart DOB: 08-Oct-1945 MRN: [id number]   Date of Service  11/21/2020  HPI/Events of Note  notified that BS was 64, has order to notify if less than 90 but does not have the hypoglycemic protocol ordered.  Pt is not eating or less than 25% , and on KVO NSS  eICU Interventions  Start D5 water at 25 ml/hr. For now. Hx of CHF/afib/s/p extubation.      Intervention Category Intermediate Interventions: Other:  Elmer Sow 11/21/2020, 8:20 PM

## 2020-11-21 NOTE — Progress Notes (Signed)
ANTICOAGULATION CONSULT NOTE  Pharmacy Consult for heparin Indication: atrial fibrillation  No Known Allergies  Patient Measurements: Height: '5\' 5"'$  (165.1 cm) Weight: (!) 164 kg (361 lb 8.9 oz) IBW/kg (Calculated) : 57 Heparin Dosing Weight: 103 kg  Vital Signs: Temp: 98.3 F (36.8 C) (09/17 0400) Temp Source: Axillary (09/17 0400) BP: 100/58 (09/17 0400) Pulse Rate: 66 (09/17 0400)  Labs: Recent Labs    11/19/20 0400 11/19/20 1244 11/20/20 0435 11/20/20 0812 11/20/20 1207 11/21/20 0407  HGB 10.2*  --  10.9* 10.9* 10.9* 10.5*  HCT 31.2*  --  33.3* 32.0* 32.0* 33.5*  PLT 150  --  146*  --   --  148*  APTT 89* 63* 74*  --   --  43*  HEPARINUNFRC >1.10*  --  >1.10*  --   --  0.86*  CREATININE 1.75*  --  1.56*  --   --  1.60*     Estimated Creatinine Clearance: 46.4 mL/min (A) (by C-G formula based on SCr of 1.6 mg/dL (H)).   Medical History: Past Medical History:  Diagnosis Date   A-fib ([place])    Arthritis    COPD (chronic obstructive pulmonary disease) (HCC)    Hypertension    IBS (irritable bowel syndrome)    Liver failure (HCC)    Morbid obesity (HCC)    Neuropathy    Peripheral vascular disease (HCC)    blood clots in legs   Physical deconditioning    Pre-diabetes    Suspected sleep apnea    Assessment: 75 year old female with history of afib presents to [facility] from nursing facility with AMS and respiratory distress. Per recent refill history from 11/12/20 it appears patient has been receiving apixaban for her afib even though warfarin is also on her list. Heparin started while undergoing stroke workup.  Heparin level still altered by DOAC, aPTT now down to subtherapeutic at 43 seconds, CBC stable. No issues with line or bleeding reported per RN.  Goal of Therapy:  Heparin level 0.3-0.5 units/ml aPTT 66-85 seconds Monitor platelets by anticoagulation protocol: Yes   Plan:  Increase heparin 1650 units/hr Will f/u 8 hr aPTT  Sherlon Handing, PharmD,  BCPS Please see amion for complete clinical pharmacist phone list 11/21/2020

## 2020-11-22 ENCOUNTER — Inpatient Hospital Stay (HOSPITAL_COMMUNITY): Payer: Medicare Other

## 2020-11-22 DIAGNOSIS — R0902 Hypoxemia: Secondary | ICD-10-CM | POA: Diagnosis not present

## 2020-11-22 DIAGNOSIS — N179 Acute kidney failure, unspecified: Secondary | ICD-10-CM | POA: Diagnosis not present

## 2020-11-22 DIAGNOSIS — G9340 Encephalopathy, unspecified: Secondary | ICD-10-CM | POA: Diagnosis not present

## 2020-11-22 DIAGNOSIS — J9621 Acute and chronic respiratory failure with hypoxia: Secondary | ICD-10-CM | POA: Diagnosis not present

## 2020-11-22 LAB — BASIC METABOLIC PANEL
Anion gap: 11 (ref 5–15)
BUN: 27 mg/dL — ABNORMAL HIGH (ref 8–23)
CO2: 32 mmol/L (ref 22–32)
Calcium: 8.9 mg/dL (ref 8.9–10.3)
Chloride: 96 mmol/L — ABNORMAL LOW (ref 98–111)
Creatinine, Ser: 1.59 mg/dL — ABNORMAL HIGH (ref 0.44–1.00)
GFR, Estimated: 33 mL/min — ABNORMAL LOW (ref >60)
Glucose, Bld: 87 mg/dL (ref 70–99)
Potassium: 4.6 mmol/L (ref 3.5–5.1)
Sodium: 139 mmol/L (ref 135–145)

## 2020-11-22 LAB — CBC
HCT: 34.3 % — ABNORMAL LOW (ref 36.0–46.0)
Hemoglobin: 10.4 g/dL — ABNORMAL LOW (ref 12.0–15.0)
MCH: 31.8 pg (ref 26.0–34.0)
MCHC: 30.3 g/dL (ref 30.0–36.0)
MCV: 104.9 fL — ABNORMAL HIGH (ref 80.0–100.0)
Platelets: 168 10*3/uL (ref 150–400)
RBC: 3.27 MIL/uL — ABNORMAL LOW (ref 3.87–5.11)
RDW: 14.4 % (ref 11.5–15.5)
WBC: 5.8 10*3/uL (ref 4.0–10.5)
nRBC: 0 % (ref 0.0–0.2)

## 2020-11-22 LAB — CULTURE, BLOOD (ROUTINE X 2)
Culture: NO GROWTH
Culture: NO GROWTH

## 2020-11-22 LAB — CULTURE, RESPIRATORY W GRAM STAIN: Culture: NORMAL

## 2020-11-22 LAB — GLUCOSE, CAPILLARY
Glucose-Capillary: 87 mg/dL (ref 70–99)
Glucose-Capillary: 91 mg/dL (ref 70–99)
Glucose-Capillary: 83 mg/dL (ref 70–99)
Glucose-Capillary: 82 mg/dL (ref 70–99)
Glucose-Capillary: 91 mg/dL (ref 70–99)

## 2020-11-22 LAB — APTT
aPTT: 123 seconds — ABNORMAL HIGH (ref 24–36)
aPTT: 141 seconds — ABNORMAL HIGH (ref 24–36)

## 2020-11-22 LAB — HEPARIN LEVEL (UNFRACTIONATED): Heparin Unfractionated: 1.04 IU/mL — ABNORMAL HIGH (ref 0.30–0.70)

## 2020-11-22 MED ORDER — HEPARIN (PORCINE) 25000 UT/250ML-% IV SOLN: 1450.0000 [IU]/h | INTRAVENOUS | Status: DC

## 2020-11-22 MED ORDER — DEXTROSE 5 % IV SOLN: INTRAVENOUS | Status: DC

## 2020-11-22 MED ORDER — IPRATROPIUM-ALBUTEROL 0.5-2.5 (3) MG/3ML IN SOLN: 3.0000 mL | Freq: Four times a day (QID) | RESPIRATORY_TRACT | Status: DC | PRN

## 2020-11-22 NOTE — Progress Notes (Addendum)
PROGRESS NOTE  [name] QR:[id number] DOB: 07/09/1945 DOA: 11/16/2020 PCP: Jean Stewart, No Pcp Per (Inactive)  HPI/Recap of past 24 hours: Pccm pick up This is a 75 year old female with past medical history significant for morbid obesity, hypertension, arthritis, atrial fibrillation on Eliquis, obstructive sleep apnea, she was admitted with code stroke from the emergency department she had altered mental status with initial PCO2 of 99 by blood gas.  She was initially treated with IV fluid and BiPAP but due to worsening mentation she was intubated and critical care was consulted and was admitted to ICU and had been managed by critical care Code stroke found her to have some focal findings of left gaze preference and reduced blink to threat on the right  Subjective Jean Stewart seen and examined at bedside Nurse did not report any adverse event overnight Her daughter Danequa Amor who just arrived from [place] by both this morning is at bedside and stated that she had abdominal pain prior to her condition changing and was wondering if she had appendicitis If we have checked for appendicitis.  I advised this is unlikely.  Jean Stewart however has been receiving IV antibiotics. Have ordered x-ray of the abdomen  Assessment/Plan: Active Problems:   Hypoxia   Acute on chronic respiratory failure with hypoxia and hypercapnia (HCC)   AKI (acute kidney injury) ([place])   Hyperkalemia   Acute encephalopathy  1.  Acute on chronic hypercarbic respiratory failure requiring intubation and mechanical ventilation s/p extubation Acute diastolic heart failure Bilateral pulmonary edema which may be related to obesity and obstructive sleep apnea as well as obesity hypoventilation syndrome  2.  History of COPD Continue ceftriaxone with a stop date.  Jean Stewart was initially on Precedex at 4 L. Continue BiPAP  3.  Acute metabolic encephalopathy EEG done on November 17, 2020 indicates moderate  encephalopathy With CO2 narcosis Possible CVA Jean Stewart is still nonverbal but she was able to track with her eyes and was making grunting sounds in response to question and also nodding her head. Jean Stewart has been receiving speech therapy  4.  Shock.  Jean Stewart is still on midodrine treatment and blood pressure Follow-up blood culture and sputum culture Blood culture no growth after 5 days.  X2 Tracheal aspirate showing rare respiratory flora no Pseudomonas or staph aureus seen  5.  AKI: Current creatinine is 1.59 Continue IV hydration Monitor creatinine and renal function  6.  Atrial fibrillation with rapid ventricular rate Rate is now fairly controlled Continue metoprolol Continue heparin  Code Status: Full  Severity of Illness: The appropriate Jean Stewart status for this Jean Stewart is INPATIENT. Inpatient status is judged to be reasonable and necessary in order to provide the required intensity of service to ensure the Jean Stewart's safety. The Jean Stewart's presenting symptoms, physical exam findings, and initial radiographic and laboratory data in the context of their chronic comorbidities is felt to place them at high risk for further clinical deterioration. Furthermore, it is not anticipated that the Jean Stewart will be medically stable for discharge from the hospital within 2 midnights of admission. The following factors support the Jean Stewart status of inpatient.   " Altered mental status acute stroke   * I certify that at the point of admission it is my clinical judgment that the Jean Stewart will require inpatient hospital care spanning beyond 2 midnights from the point of admission due to high intensity of service, high risk for further deterioration and high frequency of surveillance required.*   Family Communication: Discussed with her daughter Hilary Hertz who  traveled by bus from [place] and just arrived this morning.  Daughter stated that the last time she spoke to her she had complained of  abdominal pain  Disposition Plan: To be determined Status is: Inpatient   Dispo: The Jean Stewart is from: SNF              Anticipated d/c is to:               Anticipated d/c date is:               Jean Stewart currently not medically stable for discharge  Consultants: PCCM /critical care  Procedures: Intubation  Antimicrobials: Ceftriaxone  DVT prophylaxis: Heparin   Objective: Vitals:   11/22/20 0600 11/22/20 0700 11/22/20 0750 11/22/20 0800  BP: 121/64 (!) 123/104  94/69  Pulse: 99 88  82  Resp: '10 14  15  '$ Temp:   98.7 F (37.1 C)   TempSrc:   Oral   SpO2: 97% 99%  100%  Weight:      Height:        Intake/Output Summary (Last 24 hours) at 11/22/2020 0849 Last data filed at 11/22/2020 0800 Gross per 24 hour  Intake 1010.73 ml  Output 5875 ml  Net -4864.27 ml   Filed Weights   11/19/20 0500 11/21/20 0205 11/22/20 0500  Weight: (!) 173 kg (!) 164 kg (!) 168 kg   Body mass index is 61.63 kg/m.  Exam:  General: 75 y.o. year-old female well developed well nourished in no acute distress.  Alert and oriented x3.  Morbidly obese Cardiovascular: Regular rate and rhythm with no rubs or gallops.  No thyromegaly or JVD noted.   Respiratory: Clear to auscultation with no wheezes or rales. Good inspiratory effort. Abdomen: Soft nontender nondistended with normal bowel sounds x4 quadrants. Musculoskeletal: Mild lower extremity edema. 2/4 pulses in all 4 extremities. Skin: No ulcerative lesions noted or rashes, Psychiatry: Mood is appropriate for condition and setting Neurology:    Data Reviewed: CBC: Recent Labs  Lab 11/16/20 2114 11/16/20 2118 11/18/20 0343 11/19/20 0400 11/20/20 0435 11/20/20 0812 11/20/20 1207 11/21/20 0407 11/22/20 0352  WBC 7.8   < > 10.9* 9.8 7.5  --   --  6.0 5.8  NEUTROABS 5.9  --   --   --   --   --   --   --   --   HGB 12.5   < > 11.4* 10.2* 10.9* 10.9* 10.9* 10.5* 10.4*  HCT 41.2   < > 34.5* 31.2* 33.3* 32.0* 32.0* 33.5* 34.3*  MCV  106.2*   < > 98.0 100.0 99.4  --   --  104.0* 104.9*  PLT 171   < > 191 150 146*  --   --  148* 168   < > = values in this interval not displayed.   Basic Metabolic Panel: Recent Labs  Lab 11/17/20 0731 11/17/20 1900 11/18/20 0343 11/18/20 1058 11/18/20 2207 11/19/20 0400 11/20/20 0435 11/20/20 0812 11/20/20 1207 11/21/20 0407 11/22/20 0352  NA 138 137  --  138  --  139 139 139 137 138 139  K 5.5* 4.2  --  3.6  --  3.7 4.1 4.1 4.3 4.9 4.6  CL 97* 99  --  101  --  100 99  --   --  99 96*  CO2 29 28  --  26  --  28 27  --   --  30 32  GLUCOSE 117* 110*  --  124*  --  138* 146*  --   --  110* 87  BUN 28* 28*  --  29*  --  29* 28*  --   --  29* 27*  CREATININE 2.41* 2.24*  --  2.18*  --  1.75* 1.56*  --   --  1.60* 1.59*  CALCIUM 9.0 8.5*  --  8.3*  --  8.2* 8.8*  --   --  8.6* 8.9  MG 2.4 2.1 2.1  --  2.1 2.1 2.1  --   --   --   --   PHOS 3.5 2.5 2.4*  --  3.2 3.2  --   --   --   --   --    GFR: Estimated Creatinine Clearance: 47.4 mL/min (A) (by C-G formula based on SCr of 1.59 mg/dL (H)). Liver Function Tests: Recent Labs  Lab 11/16/20 2114  AST 15  ALT 16  ALKPHOS 117  BILITOT 0.6  PROT 7.3  ALBUMIN 3.4*   No results for input(s): LIPASE, AMYLASE in the last 168 hours. Recent Labs  Lab 11/16/20 2207  AMMONIA 30   Coagulation Profile: Recent Labs  Lab 11/16/20 2114  INR 1.5*   Cardiac Enzymes: No results for input(s): CKTOTAL, CKMB, CKMBINDEX, TROPONINI in the last 168 hours. BNP (last 3 results) No results for input(s): PROBNP in the last 8760 hours. HbA1C: No results for input(s): HGBA1C in the last 72 hours. CBG: Recent Labs  Lab 11/21/20 1543 11/21/20 1941 11/21/20 2341 11/22/20 0350 11/22/20 0732  GLUCAP 84 74 88 87 91   Lipid Profile: Recent Labs    11/21/20 0409  TRIG 51   Thyroid Function Tests: No results for input(s): TSH, T4TOTAL, FREET4, T3FREE, THYROIDAB in the last 72 hours. Anemia Panel: No results for input(s): VITAMINB12,  FOLATE, FERRITIN, TIBC, IRON, RETICCTPCT in the last 72 hours. Urine analysis:    Component Value Date/Time   COLORURINE YELLOW 11/17/2020 0400   APPEARANCEUR CLEAR 11/17/2020 0400   LABSPEC 1.012 11/17/2020 0400   PHURINE 5.0 11/17/2020 0400   GLUCOSEU NEGATIVE 11/17/2020 0400   HGBUR NEGATIVE 11/17/2020 0400   BILIRUBINUR NEGATIVE 11/17/2020 0400   KETONESUR NEGATIVE 11/17/2020 0400   PROTEINUR NEGATIVE 11/17/2020 0400   NITRITE NEGATIVE 11/17/2020 0400   LEUKOCYTESUR NEGATIVE 11/17/2020 0400   Sepsis Labs: '@LABRCNTIP'$ (procalcitonin:4,lacticidven:4)  ) Recent Results (from the past 240 hour(s))  Resp Panel by RT-PCR (Flu A&B, Covid) Nasopharyngeal Swab     Status: None   Collection Time: 11/16/20 10:56 PM   Specimen: Nasopharyngeal Swab; Nasopharyngeal(NP) swabs in vial transport medium  Result Value Ref Range Status   SARS Coronavirus 2 by RT PCR NEGATIVE NEGATIVE Final    Comment: (NOTE) SARS-CoV-2 target nucleic acids are NOT DETECTED.  The SARS-CoV-2 RNA is generally detectable in upper respiratory specimens during the acute phase of infection. The lowest concentration of SARS-CoV-2 viral copies this assay can detect is 138 copies/mL. A negative result does not preclude SARS-Cov-2 infection and should not be used as the sole basis for treatment or other Jean Stewart management decisions. A negative result may occur with  improper specimen collection/handling, submission of specimen other than nasopharyngeal swab, presence of viral mutation(s) within the areas targeted by this assay, and inadequate number of viral copies(<138 copies/mL). A negative result must be combined with clinical observations, Jean Stewart history, and epidemiological information. The expected result is Negative.  Fact Sheet for Patients:  EntrepreneurPulse.com.au  Fact Sheet for Healthcare Providers:  IncredibleEmployment.be  This test is  no t yet approved or cleared  by the [place] and  has been authorized for detection and/or diagnosis of SARS-CoV-2 by FDA under an Emergency Use Authorization (EUA). This EUA will remain  in effect (meaning this test can be used) for the duration of the COVID-19 declaration under Section 564(b)(1) of the Act, 21 U.S.C.section 360bbb-3(b)(1), unless the authorization is terminated  or revoked sooner.       Influenza A by PCR NEGATIVE NEGATIVE Final   Influenza B by PCR NEGATIVE NEGATIVE Final    Comment: (NOTE) The Xpert Xpress SARS-CoV-2/FLU/RSV plus assay is intended as an aid in the diagnosis of influenza from Nasopharyngeal swab specimens and should not be used as a sole basis for treatment. Nasal washings and aspirates are unacceptable for Xpert Xpress SARS-CoV-2/FLU/RSV testing.  Fact Sheet for Patients: EntrepreneurPulse.com.au  Fact Sheet for Healthcare Providers: IncredibleEmployment.be  This test is not yet approved or cleared by the [place] FDA and has been authorized for detection and/or diagnosis of SARS-CoV-2 by FDA under an Emergency Use Authorization (EUA). This EUA will remain in effect (meaning this test can be used) for the duration of the COVID-19 declaration under Section 564(b)(1) of the Act, 21 U.S.C. section 360bbb-3(b)(1), unless the authorization is terminated or revoked.  Performed at [facility], [place] [address] [place], [place] [address]   MRSA Next Gen by PCR, Nasal     Status: None   Collection Time: 11/17/20  3:48 AM   Specimen: Nasal Mucosa; Nasal Swab  Result Value Ref Range Status   MRSA by PCR Next Gen NOT DETECTED NOT DETECTED Final    Comment: (NOTE) The GeneXpert MRSA Assay (FDA approved for NASAL specimens only), is one component of a comprehensive MRSA colonization surveillance program. It is not intended to diagnose MRSA infection nor to guide or monitor treatment for MRSA infections. Test performance  is not FDA approved in patients less than 77 years old. Performed at [facility], [place] [address], [place], [place] [address]   Urine Culture     Status: None   Collection Time: 11/17/20  4:00 AM   Specimen: In/Out Cath Urine  Result Value Ref Range Status   Specimen Description IN/OUT CATH URINE  Final   Special Requests NONE  Final   Culture   Final    NO GROWTH Performed at [facility], [place] [address], [place], [place] [address]    Report Status 11/18/2020 FINAL  Final  Culture, blood (Routine X 2) w Reflex to ID Panel     Status: None   Collection Time: 11/17/20  7:16 AM   Specimen: BLOOD LEFT HAND  Result Value Ref Range Status   Specimen Description BLOOD LEFT HAND  Final   Special Requests   Final    BOTTLES DRAWN AEROBIC ONLY Blood Culture results may not be optimal due to an inadequate volume of blood received in culture bottles   Culture   Final    NO GROWTH 5 DAYS Performed at [facility], [place] [address], [place], [place] [address]    Report Status 11/22/2020 FINAL  Final  Culture, blood (Routine X 2) w Reflex to ID Panel     Status: None   Collection Time: 11/17/20  7:34 AM   Specimen: BLOOD RIGHT HAND  Result Value Ref Range Status   Specimen Description BLOOD RIGHT HAND  Final   Special Requests   Final    BOTTLES DRAWN AEROBIC  ONLY Blood Culture results may not be optimal due to an inadequate volume of blood received in culture bottles   Culture   Final    NO GROWTH 5 DAYS Performed at [place] [address] [place], [place] [address]    Report Status 11/22/2020 FINAL  Final  Culture, Respiratory w Gram Stain     Status: None   Collection Time: 11/18/20 12:10 PM   Specimen: Tracheal Aspirate; Respiratory  Result Value Ref Range Status   Specimen Description TRACHEAL ASPIRATE  Final   Special Requests NONE  Final   Gram Stain   Final    MODERATE WBC PRESENT,BOTH PMN AND MONONUCLEAR FEW GRAM POSITIVE COCCI RARE GRAM  VARIABLE ROD    Culture   Final    FEW Normal respiratory flora-no Staph aureus or Pseudomonas seen Performed at [facility], [address] [place], [place] [address]    Report Status 11/20/2020 FINAL  Final  Culture, Respiratory w Gram Stain     Status: None (Preliminary result)   Collection Time: 11/20/20  8:48 AM   Specimen: Tracheal Aspirate; Respiratory  Result Value Ref Range Status   Specimen Description TRACHEAL ASPIRATE  Final   Special Requests NONE  Final   Gram Stain   Final    MODERATE WBC PRESENT,BOTH PMN AND MONONUCLEAR RARE GRAM POSITIVE COCCI IN PAIRS    Culture   Final    CULTURE REINCUBATED FOR BETTER GROWTH Performed at [facility], [place] [address], [place], [place] [address]    Report Status PENDING  Incomplete      Studies: No results found.  Scheduled Meds:  Chlorhexidine Gluconate Cloth  6 each Topical Daily   docusate sodium  100 mg Oral BID   feeding supplement (PROSource TF)  90 mL Per Tube TID   ipratropium-albuterol  3 mL Nebulization TID   mouth rinse  15 mL Mouth Rinse BID   metoprolol tartrate  25 mg Oral Q8H   midodrine  10 mg Oral TID   polyethylene glycol  17 g Oral Daily   sodium chloride flush  10-40 mL Intracatheter Q12H   sodium chloride flush  3 mL Intravenous Once    Continuous Infusions:  sodium chloride 10 mL/hr at 11/22/20 0800   cefTRIAXone (ROCEPHIN)  IV Stopped (11/21/20 1036)   feeding supplement (VITAL 1.5 CAL) 1,000 mL (11/19/20 0500)   heparin 1,700 Units/hr (11/22/20 0800)     LOS: 5 days     Cristal Deer, MD [facility]  To reach me or the doctor on call, go to: [email or web address] Password [facility]  11/22/2020, 8:49 AM

## 2020-11-22 NOTE — Progress Notes (Signed)
ANTICOAGULATION CONSULT NOTE  Pharmacy Consult for heparin Indication: atrial fibrillation  No Known Allergies  Patient Measurements: Height: '5\' 5"'$  (165.1 cm) Weight: (!) 168 kg (370 lb 6 oz) IBW/kg (Calculated) : 57 Heparin Dosing Weight: 103 kg  Vital Signs: Temp: 99 F (37.2 C) (09/18 1150) Temp Source: Oral (09/18 1150) BP: 116/43 (09/18 1400) Pulse Rate: 85 (09/18 1400)  Labs: Recent Labs    11/20/20 0435 11/20/20 0812 11/20/20 1207 11/21/20 0407 11/21/20 1300 11/22/20 0352 11/22/20 1238  HGB 10.9*   < > 10.9* 10.5*  --  10.4*  --   HCT 33.3*   < > 32.0* 33.5*  --  34.3*  --   PLT 146*  --   --  148*  --  168  --   APTT 74*  --   --  43* 42* 123* 141*  HEPARINUNFRC >1.10*  --   --  0.86*  --  1.04*  --   CREATININE 1.56*  --   --  1.60*  --  1.59*  --    < > = values in this interval not displayed.     Estimated Creatinine Clearance: 47.4 mL/min (A) (by C-G formula based on SCr of 1.59 mg/dL (H)).   Medical History: Past Medical History:  Diagnosis Date   A-fib ([place])    Arthritis    COPD (chronic obstructive pulmonary disease) (HCC)    Hypertension    IBS (irritable bowel syndrome)    Liver failure (HCC)    Morbid obesity (HCC)    Neuropathy    Peripheral vascular disease (HCC)    blood clots in legs   Physical deconditioning    Pre-diabetes    Suspected sleep apnea    Assessment: 75 year old female with history of afib presents to [facility] from nursing facility with AMS and respiratory distress. Per recent refill history from 11/12/20 it appears patient has been receiving apixaban for her afib even though warfarin is also on her list. Heparin started while undergoing stroke workup.  aPTT remains elevated at 141 seconds. Infusion is running peripherally and phlebotomy drew from opposite arm so should be accurate.  Goal of Therapy:  Heparin level 0.3-0.5 units/ml aPTT 66-85 seconds Monitor platelets by anticoagulation protocol: Yes   Plan:  Hold  heparin x30 minutes Reduce heparin to 1450 units/h Repeat aPTT in 8h  Arrie Senate, PharmD, [place], [facility] Clinical Pharmacist [phone] Please check AMION for all [facility] Pharmacy numbers 11/22/2020

## 2020-11-22 NOTE — Progress Notes (Signed)
ANTICOAGULATION CONSULT NOTE  Pharmacy Consult for heparin Indication: atrial fibrillation  No Known Allergies  Patient Measurements: Height: '5\' 5"'$  (165.1 cm) Weight: (!) 168 kg (370 lb 6 oz) IBW/kg (Calculated) : 57 Heparin Dosing Weight: 103 kg  Vital Signs: Temp: 98.2 F (36.8 C) (09/18 0356) Temp Source: Axillary (09/18 0356) BP: 116/55 (09/18 0500) Pulse Rate: 97 (09/18 0500)  Labs: Recent Labs    11/20/20 0435 11/20/20 0812 11/20/20 1207 11/21/20 0407 11/21/20 1300 11/22/20 0352  HGB 10.9*   < > 10.9* 10.5*  --  10.4*  HCT 33.3*   < > 32.0* 33.5*  --  34.3*  PLT 146*  --   --  148*  --  168  APTT 74*  --   --  43* 42* 123*  HEPARINUNFRC >1.10*  --   --  0.86*  --  1.04*  CREATININE 1.56*  --   --  1.60*  --  1.59*   < > = values in this interval not displayed.     Estimated Creatinine Clearance: 47.4 mL/min (A) (by C-G formula based on SCr of 1.59 mg/dL (H)).   Medical History: Past Medical History:  Diagnosis Date   A-fib ([place])    Arthritis    COPD (chronic obstructive pulmonary disease) (HCC)    Hypertension    IBS (irritable bowel syndrome)    Liver failure (HCC)    Morbid obesity (HCC)    Neuropathy    Peripheral vascular disease (HCC)    blood clots in legs   Physical deconditioning    Pre-diabetes    Suspected sleep apnea    Assessment: 75 year old female with history of afib presents to [facility] from nursing facility with AMS and respiratory distress. Per recent refill history from 11/12/20 it appears patient has been receiving apixaban for her afib even though warfarin is also on her list. Heparin started while undergoing stroke workup.  Heparin level now up to 1.04 (apixaban likely still affecting), aPTT also up to 123 sec. Heparin was previously running in PIV but pt pulled that out 9/17 p.m. so heparin now running in L femoral CVC. Labs drawn from there also this morning - RN paused 5 min, flushed, drew back waste, and then drew labs. Suspect  lab contaminated with some amount of heparin. Plan to get PIV later today and hopefully move heparin back to that line.  Goal of Therapy:  Heparin level 0.3-0.5 units/ml aPTT 66-85 seconds Monitor platelets by anticoagulation protocol: Yes   Plan:  Decrease heparin to 1700 units/hr F/u aPTT (peripheral stick if possible) in 8 hours  Sherlon Handing, PharmD, BCPS Please see amion for complete clinical pharmacist phone list 11/22/2020

## 2020-11-22 NOTE — Progress Notes (Signed)
RT note: RT brought Bipap and set it up at bedside. Pt states she does not want to wear tonight.

## 2020-11-22 NOTE — Progress Notes (Signed)
Pt transferring to 92M 09. SBAR handoff given to Nurse Jimmie Molly.

## 2020-11-23 DIAGNOSIS — J9602 Acute respiratory failure with hypercapnia: Secondary | ICD-10-CM

## 2020-11-23 LAB — CBC
HCT: 32.4 % — ABNORMAL LOW (ref 36.0–46.0)
Hemoglobin: 10.1 g/dL — ABNORMAL LOW (ref 12.0–15.0)
MCH: 32.8 pg (ref 26.0–34.0)
MCHC: 31.2 g/dL (ref 30.0–36.0)
MCV: 105.2 fL — ABNORMAL HIGH (ref 80.0–100.0)
Platelets: 166 10*3/uL (ref 150–400)
RBC: 3.08 MIL/uL — ABNORMAL LOW (ref 3.87–5.11)
RDW: 14.1 % (ref 11.5–15.5)
WBC: 5.5 10*3/uL (ref 4.0–10.5)
nRBC: 0 % (ref 0.0–0.2)

## 2020-11-23 LAB — GLUCOSE, CAPILLARY
Glucose-Capillary: 83 mg/dL (ref 70–99)
Glucose-Capillary: 89 mg/dL (ref 70–99)
Glucose-Capillary: 96 mg/dL (ref 70–99)
Glucose-Capillary: 97 mg/dL (ref 70–99)

## 2020-11-23 LAB — APTT
aPTT: 134 seconds — ABNORMAL HIGH (ref 24–36)
aPTT: 140 seconds — ABNORMAL HIGH (ref 24–36)

## 2020-11-23 LAB — COMPREHENSIVE METABOLIC PANEL
ALT: 21 U/L (ref 0–44)
AST: 18 U/L (ref 15–41)
Albumin: 3.1 g/dL — ABNORMAL LOW (ref 3.5–5.0)
Alkaline Phosphatase: 83 U/L (ref 38–126)
Anion gap: 9 (ref 5–15)
BUN: 26 mg/dL — ABNORMAL HIGH (ref 8–23)
CO2: 33 mmol/L — ABNORMAL HIGH (ref 22–32)
Calcium: 9 mg/dL (ref 8.9–10.3)
Chloride: 95 mmol/L — ABNORMAL LOW (ref 98–111)
Creatinine, Ser: 1.61 mg/dL — ABNORMAL HIGH (ref 0.44–1.00)
GFR, Estimated: 33 mL/min — ABNORMAL LOW (ref >60)
Glucose, Bld: 89 mg/dL (ref 70–99)
Potassium: 4.4 mmol/L (ref 3.5–5.1)
Sodium: 137 mmol/L (ref 135–145)
Total Bilirubin: 0.7 mg/dL (ref 0.3–1.2)
Total Protein: 6.7 g/dL (ref 6.5–8.1)

## 2020-11-23 LAB — AMMONIA: Ammonia: 35 umol/L (ref 9–35)

## 2020-11-23 LAB — HEPARIN LEVEL (UNFRACTIONATED): Heparin Unfractionated: 0.97 IU/mL — ABNORMAL HIGH (ref 0.30–0.70)

## 2020-11-23 MED ORDER — FUROSEMIDE 10 MG/ML IJ SOLN: 40.0000 mg | Freq: Two times a day (BID) | INTRAMUSCULAR | Status: DC

## 2020-11-23 MED ORDER — MIDODRINE HCL 5 MG PO TABS
5.0000 mg | ORAL_TABLET | Freq: Two times a day (BID) | ORAL | Status: DC
  Administered 2020-11-23 – 2020-11-24 (×3): 5 mg via ORAL
  Filled 2020-11-23 (×3): qty 1

## 2020-11-23 MED ORDER — ENSURE ENLIVE PO LIQD
237.0000 mL | Freq: Two times a day (BID) | ORAL | Status: DC
  Administered 2020-11-25 – 2020-11-27 (×4): 237 mL via ORAL

## 2020-11-23 MED ORDER — HEPARIN (PORCINE) 25000 UT/250ML-% IV SOLN
1100.0000 [IU]/h | INTRAVENOUS | Status: DC
  Administered 2020-11-23: 1100 [IU]/h via INTRAVENOUS
  Filled 2020-11-23: qty 250

## 2020-11-23 MED ORDER — APIXABAN 5 MG PO TABS
5.0000 mg | ORAL_TABLET | Freq: Two times a day (BID) | ORAL | Status: DC
  Administered 2020-11-23 – 2020-11-27 (×9): 5 mg via ORAL
  Filled 2020-11-23 (×9): qty 1

## 2020-11-23 MED ORDER — FUROSEMIDE 10 MG/ML IJ SOLN
40.0000 mg | Freq: Every day | INTRAMUSCULAR | Status: DC
  Administered 2020-11-23 – 2020-11-24 (×2): 40 mg via INTRAVENOUS
  Filled 2020-11-23 (×2): qty 4

## 2020-11-23 MED ORDER — ADULT MULTIVITAMIN W/MINERALS CH
1.0000 | ORAL_TABLET | Freq: Every day | ORAL | Status: DC
  Administered 2020-11-23 – 2020-11-27 (×5): 1 via ORAL
  Filled 2020-11-23 (×5): qty 1

## 2020-11-23 NOTE — Progress Notes (Signed)
Nutrition Follow-up  DOCUMENTATION CODES:   Morbid obesity  INTERVENTION:   -Ensure Enlive po BID, each supplement provides 350 kcal and 20 grams of protein  -MVI with minerals daily  NUTRITION DIAGNOSIS:   Inadequate oral intake related to inability to eat as evidenced by NPO status.  Progressing; advanced to PO diet on 11/21/20  GOAL:   Patient will meet greater than or equal to 90% of their needs  Progressing   MONITOR:   PO intake, Supplement acceptance, Labs, Weight trends, Skin, I & O's  REASON FOR ASSESSMENT:   Ventilator    ASSESSMENT:   Pt with PMH of morbid obesity, HTN, arthritis, Afib on Eliquis, COPD, IBS, PVD, physical deconditioning, liver failure, presumed sleep apnea admitted with acute metabolic encephalopathy.  9/16- extubated 9/17- s/p BSE- advanced to regular consistency diet with thin liquids  Reviewed I/O's: +90 ml x 24 hours and +308 ml x 24 hours  UOP: 1.4 L x 24 hours  Spoke with pt and daughter at bedside. Per daughter, pt is a little more alert today and is eating. Noted pt consumed about 50% of her lunch. Noted documented meal completion 0-25%.   Per daughter, pt usually has a very good appetite. She lives in a SNF PTA and usually consumes 3 meals per day. Per daughter, pt has not been eating red meat due to digestive issues, but consumes chicken, [place], and fish.   Discussed importance of good meal and supplement intake to promote healing. Pt amenable to Ensure.   Medications reviewed and include colace, lasix, and miralax.   Labs reviewed.   NUTRITION - FOCUSED PHYSICAL EXAM:  Flowsheet Row Most Recent Value  Orbital Region No depletion  Upper Arm Region No depletion  Thoracic and Lumbar Region No depletion  Buccal Region No depletion  Temple Region No depletion  Clavicle Bone Region No depletion  Clavicle and Acromion Bone Region No depletion  Scapular Bone Region No depletion  Dorsal Hand No depletion  Patellar Region  No depletion  Anterior Thigh Region No depletion  Posterior Calf Region No depletion  Edema (RD Assessment) Mild  Hair Reviewed  Eyes Reviewed  Mouth Reviewed  Skin Reviewed  Nails Reviewed       Diet Order:   Diet Order             Diet Heart Room service appropriate? Yes; Fluid consistency: Thin  Diet effective now                   EDUCATION NEEDS:   Education needs have been addressed  Skin:  Skin Assessment: Reviewed RN Assessment  Last BM:  Unknown  Height:   Ht Readings from Last 1 Encounters:  11/17/20 '5\' 5"'$  (1.651 m)    Weight:   Wt Readings from Last 1 Encounters:  11/22/20 (!) 168 kg   BMI:  Body mass index is 61.63 kg/m.  Estimated Nutritional Needs:   Kcal:  1800-2000  Protein:  115-130 grams  Fluid:  >1.8 L/day    Loistine Chance, RD, LDN, [place] Registered Dietitian II Certified Diabetes Care and Education Specialist Please refer to [facility] for RD and/or RD on-call/weekend/after hours pager

## 2020-11-23 NOTE — Progress Notes (Signed)
[facility] for heparin Indication: atrial fibrillation  No Known Allergies  Patient Measurements: Height: '5\' 5"'$  (165.1 cm) Weight: (!) 168 kg (370 lb 6 oz) IBW/kg (Calculated) : 57 Heparin Dosing Weight: 103 kg  Vital Signs: Temp: 98.6 F (37 C) (09/19 0006) Temp Source: Oral (09/19 0006) BP: 105/38 (09/19 0006) Pulse Rate: 61 (09/19 0006)  Labs: Recent Labs    11/20/20 0435 11/20/20 0812 11/21/20 0407 11/21/20 1300 11/22/20 0352 11/22/20 1238 11/23/20 0040  HGB 10.9*   < > 10.5*  --  10.4*  --  10.1*  HCT 33.3*   < > 33.5*  --  34.3*  --  32.4*  PLT 146*  --  148*  --  168  --  166  APTT 74*  --  43*   < > 123* 141* 134*  140*  HEPARINUNFRC >1.10*  --  0.86*  --  1.04*  --   --   CREATININE 1.56*  --  1.60*  --  1.59*  --   --    < > = values in this interval not displayed.     Estimated Creatinine Clearance: 47.4 mL/min (A) (by C-G formula based on SCr of 1.59 mg/dL (H)).   Medical History: Past Medical History:  Diagnosis Date   A-fib ([place])    Arthritis    COPD (chronic obstructive pulmonary disease) (HCC)    Hypertension    IBS (irritable bowel syndrome)    Liver failure (HCC)    Morbid obesity (HCC)    Neuropathy    Peripheral vascular disease (HCC)    blood clots in legs   Physical deconditioning    Pre-diabetes    Suspected sleep apnea    Assessment: 75 year old female with history of afib presents to [facility] from nursing facility with AMS and respiratory distress. Per recent refill history from 11/12/20 it appears patient has been receiving apixaban for her afib even though warfarin is also on her list. 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  aPTT remains elevated at 140 seconds. Heparin level 0.97 (hard to tell if apixaban is affecting this level). Infusion is running peripherally and  phlebotomy drew from opposite arm so should be accurate. No bleeding noted.  Goal of Therapy:  Heparin level 0.3-0.5 units/ml aPTT 66-85 seconds Monitor platelets by anticoagulation protocol: Yes   Plan:  Hold heparin x 1 hr Reduce heparin to 1100 units/h Repeat aPTT/heparin level in [place], PharmD, BCPS Please see amion for complete clinical pharmacist phone list 11/23/2020 1:58 AM

## 2020-11-23 NOTE — Plan of Care (Signed)
  Problem: Education: Goal: Knowledge of General Education information will improve Description Including pain rating scale, medication(s)/side effects and non-pharmacologic comfort measures Outcome: Progressing   Problem: Health Behavior/Discharge Planning: Goal: Ability to manage health-related needs will improve Outcome: Progressing   

## 2020-11-23 NOTE — Progress Notes (Signed)
PROGRESS NOTE  [name] [id number] DOB: May 08, 1945 DOA: 11/16/2020 PCP: Patient, No Pcp Per (Inactive)  HPI/Recap of past 24 hours: Pccm pick up 9/18 75/F chronically ill from [facility], morbidly obese with BMI of 62, debilitated, COPD, chronic systolic and diastolic CHF, chronic respiratory failure on 3 L O2, OSA/OHS, hypertension, paroxysmal A. fib on Eliquis was brought to the ED with mental status changes, initially code stroke was suspected, on further work-up noted to have severe hypercarbia with CO2 narcosis. -She was admitted to the ICU, intubated and placed on mechanical ventilatory support, also seen by neurology, acute stroke was not suspected, also had AKI, and treated with 5 days of IV antibiotics. -Extubated on 9/16 to BiPAP -Transferred to [facility] service 9/18  Subjective -Reportedly refused BiPAP overnight per RT  Assessment/Plan:  Acute on chronic hypercarbic respiratory failure History of COPD Acute diastolic CHF, pulmonary edema Morbid obesity, OSA/OHS -Extubated to BiPAP on 9/16 -Completed 5 days of IV ceftriaxone in the ICU -Will add IV Lasix today, 2D echo with preserved EF, last x-ray 9/17 with interstitial edema, monitor urine output, kidney function -Unfortunately did not use BiPAP last night, emphasized critical importance of BiPAP use nightly and daytime naps -PT OT eval -Will need NIPPV at discharge if she is not on CPAP at bedtime at SNF   Acute metabolic encephalopathy -Secondary to CO2 narcosis, ABG with severe hypercarbia on admission -EEG noted metabolic encephalopathy, CT head without acute findings  Hypotension -Started on midodrine in the ICU, suspect this could have been related to sedation -Cut down midodrine dose -Cultures are negative, -Completed 5 days of IV ceftriaxone  AKI on CKD 3a -Baseline creatinine around 1.6-2, creatinine peaked to 2.4 this admission -Now down to 1.6 which is around her baseline, monitor with  diuresis  Paroxysmal atrial fibrillation with RVR -Heart rate relatively controlled, continue metoprolol, change IV heparin back to Eliquis  Morbid obesity, debility -Doubt she is ambulatory at baseline, has very limited muscle tone in lower legs thinks she walked with a walker about a year ago when she saw her last -PT OT eval  DVT prophylaxis, IV heparin, changed back to Eliquis  Code Status: Full  Family Communication: Daughter Tonya at bedside  Disposition Plan:   status is: Inpatient   Dispo: The patient is from: SNF              Anticipated d/c is to: SNF              Anticipated d/c date is: Likely 48 hours if mental status is stable              Patient currently not medically stable for discharge  Consultants: PCCM /critical care  Procedures: Intubation  Antimicrobials: Ceftriaxone  Objective: Vitals:   11/23/20 0006 11/23/20 0309 11/23/20 0444 11/23/20 1001  BP: (!) 105/38  (!) 123/46 124/64  Pulse: 61 71 71 68  Resp: '20 20 16 18  '$ Temp: 98.6 F (37 C)  98.4 F (36.9 C) 98.6 F (37 C)  TempSrc: Oral  Oral Oral  SpO2: 97% 94% 98% 99%  Weight:      Height:        Intake/Output Summary (Last 24 hours) at 11/23/2020 1008 Last data filed at 11/23/2020 0804 Gross per 24 hour  Intake 1296.08 ml  Output 1390 ml  Net -93.92 ml   Filed Weights   11/19/20 0500 11/21/20 0205 11/22/20 0500  Weight: (!) 173 kg (!) 164 kg (!) 168 kg  Body mass index is 61.63 kg/m.  Exam:  Gen: Morbidly obese female laying in bed, appears older than stated age, awake, somnolent but arousable, oriented to self and place only HEENT: Neck obese unable to assess JVD Lungs: Poor air movement bilaterally, few basilar rales CVS: S1-S2, regular rate rhythm Abdomen: Soft, obese, nontender, bowel sounds present Extremities: No edema Skin: no new rashes on exposed skin     Data Reviewed: CBC: Recent Labs  Lab 11/16/20 2114 11/16/20 2118 11/19/20 0400 11/20/20 0435  11/20/20 0812 11/20/20 1207 11/21/20 0407 11/22/20 0352 11/23/20 0040  WBC 7.8   < > 9.8 7.5  --   --  6.0 5.8 5.5  NEUTROABS 5.9  --   --   --   --   --   --   --   --   HGB 12.5   < > 10.2* 10.9* 10.9* 10.9* 10.5* 10.4* 10.1*  HCT 41.2   < > 31.2* 33.3* 32.0* 32.0* 33.5* 34.3* 32.4*  MCV 106.2*   < > 100.0 99.4  --   --  104.0* 104.9* 105.2*  PLT 171   < > 150 146*  --   --  148* 168 166   < > = values in this interval not displayed.   Basic Metabolic Panel: Recent Labs  Lab 11/17/20 0731 11/17/20 1900 11/18/20 0343 11/18/20 1058 11/18/20 2207 11/19/20 0400 11/20/20 0435 11/20/20 0812 11/20/20 1207 11/21/20 0407 11/22/20 0352 11/23/20 0824  NA 138 137  --    < >  --  139 139 139 137 138 139 137  K 5.5* 4.2  --    < >  --  3.7 4.1 4.1 4.3 4.9 4.6 4.4  CL 97* 99  --    < >  --  100 99  --   --  99 96* 95*  CO2 29 28  --    < >  --  28 27  --   --  30 32 33*  GLUCOSE 117* 110*  --    < >  --  138* 146*  --   --  110* 87 89  BUN 28* 28*  --    < >  --  29* 28*  --   --  29* 27* 26*  CREATININE 2.41* 2.24*  --    < >  --  1.75* 1.56*  --   --  1.60* 1.59* 1.61*  CALCIUM 9.0 8.5*  --    < >  --  8.2* 8.8*  --   --  8.6* 8.9 9.0  MG 2.4 2.1 2.1  --  2.1 2.1 2.1  --   --   --   --   --   PHOS 3.5 2.5 2.4*  --  3.2 3.2  --   --   --   --   --   --    < > = values in this interval not displayed.   GFR: Estimated Creatinine Clearance: 46.8 mL/min (A) (by C-G formula based on SCr of 1.61 mg/dL (H)). Liver Function Tests: Recent Labs  Lab 11/16/20 2114 11/23/20 0824  AST 15 18  ALT 16 21  ALKPHOS 117 83  BILITOT 0.6 0.7  PROT 7.3 6.7  ALBUMIN 3.4* 3.1*   No results for input(s): LIPASE, AMYLASE in the last 168 hours. Recent Labs  Lab 11/16/20 2207 11/23/20 0824  AMMONIA 30 35   Coagulation Profile: Recent Labs  Lab 11/16/20 2114  INR 1.5*  Cardiac Enzymes: No results for input(s): CKTOTAL, CKMB, CKMBINDEX, TROPONINI in the last 168 hours. BNP (last 3  results) No results for input(s): PROBNP in the last 8760 hours. HbA1C: No results for input(s): HGBA1C in the last 72 hours. CBG: Recent Labs  Lab 11/22/20 1542 11/22/20 1940 11/23/20 0005 11/23/20 0444 11/23/20 0836  GLUCAP 82 91 89 97 83   Lipid Profile: Recent Labs    11/21/20 0409  TRIG 51   Thyroid Function Tests: No results for input(s): TSH, T4TOTAL, FREET4, T3FREE, THYROIDAB in the last 72 hours. Anemia Panel: No results for input(s): VITAMINB12, FOLATE, FERRITIN, TIBC, IRON, RETICCTPCT in the last 72 hours. Urine analysis:    Component Value Date/Time   COLORURINE YELLOW 11/17/2020 0400   APPEARANCEUR CLEAR 11/17/2020 0400   LABSPEC 1.012 11/17/2020 0400   PHURINE 5.0 11/17/2020 0400   GLUCOSEU NEGATIVE 11/17/2020 0400   HGBUR NEGATIVE 11/17/2020 0400   BILIRUBINUR NEGATIVE 11/17/2020 0400   KETONESUR NEGATIVE 11/17/2020 0400   PROTEINUR NEGATIVE 11/17/2020 0400   NITRITE NEGATIVE 11/17/2020 0400   LEUKOCYTESUR NEGATIVE 11/17/2020 0400   Sepsis Labs: '@LABRCNTIP'$ (procalcitonin:4,lacticidven:4)  ) Recent Results (from the past 240 hour(s))  Resp Panel by RT-PCR (Flu A&B, Covid) Nasopharyngeal Swab     Status: None   Collection Time: 11/16/20 10:56 PM   Specimen: Nasopharyngeal Swab; Nasopharyngeal(NP) swabs in vial transport medium  Result Value Ref Range Status   SARS Coronavirus 2 by RT PCR NEGATIVE NEGATIVE Final    Comment: (NOTE) SARS-CoV-2 target nucleic acids are NOT DETECTED.  The SARS-CoV-2 RNA is generally detectable in upper respiratory specimens during the acute phase of infection. The lowest concentration of SARS-CoV-2 viral copies this assay can detect is 138 copies/mL. A negative result does not preclude SARS-Cov-2 infection and should not be used as the sole basis for treatment or other patient management decisions. A negative result may occur with  improper specimen collection/handling, submission of specimen other than nasopharyngeal  swab, presence of viral mutation(s) within the areas targeted by this assay, and inadequate number of viral copies(<138 copies/mL). A negative result must be combined with clinical observations, patient history, and epidemiological information. The expected result is Negative.  Fact Sheet for Patients:  EntrepreneurPulse.com.au  Fact Sheet for Healthcare Providers:  IncredibleEmployment.be  This test is no t yet approved or cleared by the [place] FDA and  has been authorized for detection and/or diagnosis of SARS-CoV-2 by FDA under an Emergency Use Authorization (EUA). This EUA will remain  in effect (meaning this test can be used) for the duration of the COVID-19 declaration under Section 564(b)(1) of the Act, 21 U.S.C.section 360bbb-3(b)(1), unless the authorization is terminated  or revoked sooner.       Influenza A by PCR NEGATIVE NEGATIVE Final   Influenza B by PCR NEGATIVE NEGATIVE Final    Comment: (NOTE) The Xpert Xpress SARS-CoV-2/FLU/RSV plus assay is intended as an aid in the diagnosis of influenza from Nasopharyngeal swab specimens and should not be used as a sole basis for treatment. Nasal washings and aspirates are unacceptable for Xpert Xpress SARS-CoV-2/FLU/RSV testing.  Fact Sheet for Patients: EntrepreneurPulse.com.au  Fact Sheet for Healthcare Providers: IncredibleEmployment.be  This test is not yet approved or cleared by the [place] FDA and has been authorized for detection and/or diagnosis of SARS-CoV-2 by FDA under an Emergency Use Authorization (EUA). This EUA will remain in effect (meaning this test can be used) for the duration of the COVID-19 declaration under Section 564(b)(1) of the Act,  21 U.S.C. section 360bbb-3(b)(1), unless the authorization is terminated or revoked.  Performed at [facility], [place] [address] [place], [place] [address]   MRSA Next  Gen by PCR, Nasal     Status: None   Collection Time: 11/17/20  3:48 AM   Specimen: Nasal Mucosa; Nasal Swab  Result Value Ref Range Status   MRSA by PCR Next Gen NOT DETECTED NOT DETECTED Final    Comment: (NOTE) The GeneXpert MRSA Assay (FDA approved for NASAL specimens only), is one component of a comprehensive MRSA colonization surveillance program. It is not intended to diagnose MRSA infection nor to guide or monitor treatment for MRSA infections. Test performance is not FDA approved in patients less than 22 years old. Performed at [facility], [place] [address], [place], [place] [address]   Urine Culture     Status: None   Collection Time: 11/17/20  4:00 AM   Specimen: In/Out Cath Urine  Result Value Ref Range Status   Specimen Description IN/OUT CATH URINE  Final   Special Requests NONE  Final   Culture   Final    NO GROWTH Performed at [facility], [place] [address], [place], [place] [address]    Report Status 11/18/2020 FINAL  Final  Culture, blood (Routine X 2) w Reflex to ID Panel     Status: None   Collection Time: 11/17/20  7:16 AM   Specimen: BLOOD LEFT HAND  Result Value Ref Range Status   Specimen Description BLOOD LEFT HAND  Final   Special Requests   Final    BOTTLES DRAWN AEROBIC ONLY Blood Culture results may not be optimal due to an inadequate volume of blood received in culture bottles   Culture   Final    NO GROWTH 5 DAYS Performed at [facility], [place] [address] [place], [place] [address]    Report Status 11/22/2020 FINAL  Final  Culture, blood (Routine X 2) w Reflex to ID Panel     Status: None   Collection Time: 11/17/20  7:34 AM   Specimen: BLOOD RIGHT HAND  Result Value Ref Range Status   Specimen Description BLOOD RIGHT HAND  Final   Special Requests   Final    BOTTLES DRAWN AEROBIC ONLY Blood Culture results may not be optimal due to an inadequate volume of blood received in culture bottles   Culture   Final    NO GROWTH 5  DAYS Performed at [facility], [place] [address], [place], [place] [address]    Report Status 11/22/2020 FINAL  Final  Culture, Respiratory w Gram Stain     Status: None   Collection Time: 11/18/20 12:10 PM   Specimen: Tracheal Aspirate; Respiratory  Result Value Ref Range Status   Specimen Description TRACHEAL ASPIRATE  Final   Special Requests NONE  Final   Gram Stain   Final    MODERATE WBC PRESENT,BOTH PMN AND MONONUCLEAR FEW GRAM POSITIVE COCCI RARE GRAM VARIABLE ROD    Culture   Final    FEW Normal respiratory flora-no Staph aureus or Pseudomonas seen Performed at [facility], [address] [place], [place] [address]    Report Status 11/20/2020 FINAL  Final  Culture, Respiratory w Gram Stain     Status: None   Collection Time: 11/20/20  8:48 AM   Specimen: Tracheal Aspirate; Respiratory  Result Value Ref Range Status   Specimen Description TRACHEAL ASPIRATE  Final  Special Requests NONE  Final   Gram Stain   Final    MODERATE WBC PRESENT,BOTH PMN AND MONONUCLEAR RARE GRAM POSITIVE COCCI IN PAIRS    Culture   Final    RARE Normal respiratory flora-no Staph aureus or Pseudomonas seen Performed at [facility], [address] [place], [place] [address]    Report Status 11/22/2020 FINAL  Final      Studies: DG Abd Portable 1V  Result Date: 11/22/2020 CLINICAL DATA:  75 year old female with abdominal pain. EXAM: PORTABLE ABDOMEN - 1 VIEW COMPARISON:  None. FINDINGS: The bowel gas pattern is normal. No dilated bowel loops are noted. Two oval dense structures within the abdomen are likely within bowel. IMPRESSION: No acute abnormality. Electronically Signed   By: Margarette Canada M.D.   On: 11/22/2020 15:36    Scheduled Meds:  apixaban  5 mg Oral BID   Chlorhexidine Gluconate Cloth  6 each Topical Daily   docusate sodium  100 mg Oral BID   feeding supplement (PROSource TF)  90 mL Per Tube TID   furosemide  40 mg Intravenous BID   mouth rinse  15 mL  Mouth Rinse BID   metoprolol tartrate  25 mg Oral Q8H   midodrine  5 mg Oral BID WC   polyethylene glycol  17 g Oral Daily   sodium chloride flush  10-40 mL Intracatheter Q12H   sodium chloride flush  3 mL Intravenous Once    Continuous Infusions:  sodium chloride Stopped (11/22/20 1104)   cefTRIAXone (ROCEPHIN)  IV Stopped (11/22/20 1015)   feeding supplement (VITAL 1.5 CAL) 1,000 mL (11/19/20 0500)     LOS: 6 days     Domenic Polite, MD [facility]   11/23/2020, 10:09 AM

## 2020-11-23 NOTE — Care Management Important Message (Signed)
Important Message  Patient Details  Name: Jean Stewart MRN: [id number] Date of Birth: 1945-08-20   Medicare Important Message Given:  Yes     Jaquisha Frech Montine Circle 11/23/2020, 3:40 PM

## 2020-11-23 NOTE — Progress Notes (Addendum)
Daughter in room questioned writer why RT brought the machine in the room and not use it.  Explained to daughter that the patient refused to wear the machine. While in her room the patient took her oxygen off. Patient is alert and only oriented to self. Pleasant to talk to. Saturation stable at 94% at 3 LPM via [place]. Respiration even and unlabored. Told daughter that I will going to call. RT " I am not trying to hurry them."  Told  patient to keep her oxygen on. Patient smiled.

## 2020-11-23 NOTE — Progress Notes (Signed)
New Admission Note:  Arrival Method: Bed Mental Orientation: Alert and oriented to self only. Pleasant to speak to.  Telemetry: BOx 09 Assessment: Completed Skin: warm and dry IV: LFA NSL Pain: Denies  Tubes: Foley Cath  Safety Measures: Safety Fall Prevention Plan initiated.  Admission: Completed 5 M  Orientation: Patient has been orientated to the room, unit and the staff. Welcome booklet given.  Family: DTR at bedside   Orders have been reviewed and implemented. Will continue to monitor the patient. Call light has been placed within reach and bed alarm has been activated.   Sima Matas BSN, RN  Phone Number: [phone]

## 2020-11-23 NOTE — Plan of Care (Signed)
  Problem: Health Behavior/Discharge Planning: Goal: Ability to manage health-related needs will improve Outcome: Progressing   Problem: Activity: Goal: Risk for activity intolerance will decrease Outcome: Progressing   Problem: Safety: Goal: Ability to remain free from injury will improve Outcome: Progressing   

## 2020-11-23 NOTE — Care Management Important Message (Signed)
Important Message  Patient Details  Name: Jean Stewart MRN: [id number] Date of Birth: [birth year]   Medicare Important Message Given:  Yes     Memory Argue 11/23/2020, 12:56 PM

## 2020-11-23 NOTE — Progress Notes (Signed)
Physical Therapy Treatment Patient Details Name: Jean Stewart MRN: [id number] DOB: May 17, 1945 Today's Date: 11/23/2020   History of Present Illness Pt is a 75 year old woman admitted from SNF on 11/17/20 as code stroke with AMS and L side gaze. Found to have PC02 of 99. Mental status worsened requring intubation from 11/17/20 to 11/20/20. Head CT negative for acute changes. Hospital course complicated by AKI and hyperkalemia. PMH: obesity, afib, CHF, anemia, suspected sleep apnea, IBS, liver failure, PVD, neuropathy, pre-diabetes, COPD, arthritis.    PT Comments    Pt alert; follows one step commands inconsistently and displays delayed processing. Session focused on bed level exercises for strengthening/ROM and functional mobility. Pt requiring two person maximal assist for bed mobility. SpO2 100% on 3L O2. Pt will require maxi move lift for out of bed mobility.  Will continue to follow acutely.    Recommendations for follow up therapy are one component of a multi-disciplinary discharge planning process, led by the attending physician.  Recommendations may be updated based on patient status, additional functional criteria and insurance authorization.  Follow Up Recommendations  SNF;Supervision/Assistance - 24 hour     Equipment Recommendations   (defer to post acute)    Recommendations for Other Services       Precautions / Restrictions Precautions Precautions: Fall Restrictions Weight Bearing Restrictions: No     Mobility  Bed Mobility Overal bed mobility: Needs Assistance Bed Mobility: Supine to Sit;Sit to Supine     Supine to sit: +2 for physical assistance;Max assist Sit to supine: +2 for physical assistance;Max assist   General bed mobility comments: Pt minimally assisting with bringing LLE off edge of bed, assist for trunk and BLE's    Transfers                 General transfer comment: deferred, will require lift equipment  Ambulation/Gait                  Stairs             Wheelchair Mobility    Modified Rankin (Stroke Patients Only)       Balance Overall balance assessment: Needs assistance Sitting-balance support: Bilateral upper extremity supported;Feet unsupported Sitting balance-Leahy Scale: Zero Sitting balance - Comments: sat in bed with LEs partially dangling Postural control: Posterior lean                                  Cognition Arousal/Alertness: Awake/alert Behavior During Therapy: WFL for tasks assessed/performed Overall Cognitive Status: Impaired/Different from baseline Area of Impairment: Attention;Following commands;Problem solving                   Current Attention Level: Focused;Sustained   Following Commands: Follows one step commands inconsistently     Problem Solving: Decreased initiation;Slow processing General Comments: Pt answering questions using short phrases with significantly delayed processing, decreased initiation with following 1 step commands      Exercises General Exercises - Upper Extremity Shoulder Flexion: Both;AROM;5 reps;Supine General Exercises - Lower Extremity Ankle Circles/Pumps: PROM;Both;15 reps;Supine Hip ABduction/ADduction: AAROM;Both;5 reps;Supine Straight Leg Raises: AAROM;Both;5 reps;Supine    General Comments        Pertinent Vitals/Pain Pain Assessment: Faces Faces Pain Scale: Hurts a little bit Pain Location: generalized, pt states no pain, grimacing noted during mobility Pain Descriptors / Indicators: Grimacing Pain Intervention(s): Monitored during session;Limited activity within patient's tolerance    Home Living  Prior Function            PT Goals (current goals can now be found in the care plan section) Acute Rehab PT Goals Patient Stated Goal: pt daughter would like her cognition to improve PT Goal Formulation: With patient Time For Goal Achievement: 12/04/20 Potential to  Achieve Goals: Good Progress towards PT goals: Progressing toward goals    Frequency    Min 2X/week      PT Plan Frequency needs to be updated    Co-evaluation              AM-PAC PT "6 Clicks" Mobility   Outcome Measure  Help needed turning from your back to your side while in a flat bed without using bedrails?: Total Help needed moving from lying on your back to sitting on the side of a flat bed without using bedrails?: Total Help needed moving to and from a bed to a chair (including a wheelchair)?: Total Help needed standing up from a chair using your arms (e.g., wheelchair or bedside chair)?: Total Help needed to walk in hospital room?: Total Help needed climbing 3-5 steps with a railing? : Total 6 Click Score: 6    End of Session Equipment Utilized During Treatment: Oxygen Activity Tolerance: Patient tolerated treatment well;Patient limited by fatigue Patient left: in bed;with call bell/phone within reach;with nursing/sitter in room Nurse Communication: Mobility status PT Visit Diagnosis: Other abnormalities of gait and mobility (R26.89);Muscle weakness (generalized) (M62.81)     Time: [id number] PT Time Calculation (min) (ACUTE ONLY): 29 min  Charges:  $Therapeutic Exercise: 8-22 mins $Therapeutic Activity: 8-22 mins                     Wyona Almas, PT, DPT Acute Rehabilitation Services Pager [phone] Office [phone]    Deno Etienne 11/23/2020, 1:13 PM

## 2020-11-23 NOTE — Progress Notes (Signed)
RT placed patient on CPAP with 3L O2 bled into circuit. Patient tolerating settings well at this time. RT will monitor as needed.

## 2020-11-24 DIAGNOSIS — J9602 Acute respiratory failure with hypercapnia: Secondary | ICD-10-CM | POA: Diagnosis not present

## 2020-11-24 LAB — CBC
HCT: 32 % — ABNORMAL LOW (ref 36.0–46.0)
Hemoglobin: 9.7 g/dL — ABNORMAL LOW (ref 12.0–15.0)
MCH: 31.7 pg (ref 26.0–34.0)
MCHC: 30.3 g/dL (ref 30.0–36.0)
MCV: 104.6 fL — ABNORMAL HIGH (ref 80.0–100.0)
Platelets: 159 10*3/uL (ref 150–400)
RBC: 3.06 MIL/uL — ABNORMAL LOW (ref 3.87–5.11)
RDW: 13.4 % (ref 11.5–15.5)
WBC: 3.7 10*3/uL — ABNORMAL LOW (ref 4.0–10.5)
nRBC: 0 % (ref 0.0–0.2)

## 2020-11-24 LAB — BASIC METABOLIC PANEL
Anion gap: 8 (ref 5–15)
BUN: 24 mg/dL — ABNORMAL HIGH (ref 8–23)
CO2: 33 mmol/L — ABNORMAL HIGH (ref 22–32)
Calcium: 9.1 mg/dL (ref 8.9–10.3)
Chloride: 97 mmol/L — ABNORMAL LOW (ref 98–111)
Creatinine, Ser: 1.45 mg/dL — ABNORMAL HIGH (ref 0.44–1.00)
GFR, Estimated: 37 mL/min — ABNORMAL LOW (ref >60)
Glucose, Bld: 94 mg/dL (ref 70–99)
Potassium: 4.4 mmol/L (ref 3.5–5.1)
Sodium: 138 mmol/L (ref 135–145)

## 2020-11-24 LAB — TRIGLYCERIDES: Triglycerides: 79 mg/dL (ref <150)

## 2020-11-24 MED ORDER — FUROSEMIDE 40 MG PO TABS
40.0000 mg | ORAL_TABLET | Freq: Every day | ORAL | Status: DC
  Administered 2020-11-25 – 2020-11-27 (×3): 40 mg via ORAL
  Filled 2020-11-24 (×3): qty 1

## 2020-11-24 MED ORDER — MIDODRINE HCL 5 MG PO TABS
5.0000 mg | ORAL_TABLET | Freq: Every day | ORAL | Status: AC
  Administered 2020-11-25: 5 mg via ORAL
  Filled 2020-11-24: qty 1

## 2020-11-24 NOTE — TOC Progression Note (Deleted)
Transition of Care [facility]) - Progression Note    Patient Details  Name: Jean Stewart MRN: [id number] Date of Birth: 1945-12-21  Transition of Care [facility]) CM/SW Contact  [name], LCSW Phone Number: 11/24/2020, 2:05 PM  Clinical Narrative:  Talked with patient at bedside with her daughter Jean Stewart present. Jean Stewart was awake, alert and willing to talk with CSW. Confirmed with patient that she came to hospital from [facility]), after patient recalled the name of the facility with the help of her daughter.  When asked, Jean Stewart responded that she plans to return to [facility] once discharged.    Expected Discharge Plan: [place] [facility]) Barriers to Discharge: Continued Medical Work up  Expected Discharge Plan and Services Expected Discharge Plan: [place] [facility]) In-house Referral: Clinical Social Work     Living arrangements for the past 2 months: [place] (Long-term care residentt)                                       Social Determinants of Health (SDOH) Interventions  No SDOH interventions requested or needed at this time.  Readmission Risk Interventions No flowsheet data found.

## 2020-11-24 NOTE — Plan of Care (Signed)

## 2020-11-24 NOTE — TOC Initial Note (Signed)
Transition of Care [facility]) - Initial/Assessment Note    Patient Details  Name: Jean Stewart MRN: [id number] Date of Birth: Jul 30, 1945  Transition of Care [facility]) CM/SW Contact:    Jean Feil, LCSW Phone Number: 11/24/2020, 2:12 PM  Clinical Narrative:  Talked with patient at bedside with her daughter Jean Stewart present. Jean Stewart was awake, alert and willing to talk with CSW. Confirmed with patient that she came to hospital from [facility]), after patient recalled the name of the facility with the help of her daughter.  When asked, Jean Stewart responded that she plans to return to [facility] once discharged.                   Expected Discharge Plan: [place] [facility]) Barriers to Discharge: Continued Medical Work up   Patient Goals and CMS Choice Patient states their goals for this hospitalization and ongoing recovery are:: Patient agreeable to returing to [facility] where she is a LTC resident [email or web address] Compare Post Acute Care list provided to:: Other (Comment Required) (Not needed as patient returning to [facility]) Choice offered to / list presented to : NA  Expected Discharge Plan and Services Expected Discharge Plan: [facility]) In-house Referral: Clinical Social Work   Post Acute Care Choice: [place] Living arrangements for the past 2 months: [place]                                      Prior Living Arrangements/Services Living arrangements for the past 2 months: [place] Lives with:: Facility Resident [facility]) Patient language and need for interpreter reviewed:: Yes Do you feel safe going back to the place where you live?: Yes      Need for Family Participation in Patient Care: Yes (Comment) Care giver support system in place?: Yes (comment)   Criminal Activity/Legal Involvement Pertinent  to Current Situation/Hospitalization: No - Comment as needed  Activities of Daily Living      Permission Sought/Granted Permission sought to share information with : Family Supports Permission granted to share information with : Yes, Verbal Permission Granted  Share Information with NAME: Jean Stewart     Permission granted to share info w Relationship: Daughter  Permission granted to share info w Contact Information: [phone]  Emotional Assessment Appearance:: Appears younger than stated age Attitude/Demeanor/Rapport: Engaged Affect (typically observed): Pleasant Orientation: : Oriented to Self Alcohol / Substance Use: Tobacco Use, Alcohol Use, Illicit Drugs (Per H&P, patient does not smoke, drink or use illicit drugs) Psych Involvement: No (comment)  Admission diagnosis:  Hypoxia [R09.02] Acute respiratory failure with hypercapnia ([place]) [J96.02] Acute on chronic respiratory failure with hypoxia and hypercapnia ([place]) [id number], J96.22] Patient Active Problem List   Diagnosis Date Noted   AKI (acute kidney injury) ([place])    Hyperkalemia    Acute encephalopathy    Hypoxia 11/17/2020   Acute on chronic respiratory failure with hypoxia and hypercapnia ([place]) 11/17/2020   Acute on chronic respiratory failure with hypercapnia ([place])    Type III open trimalleolar fracture of left ankle 01/22/2019   Open fracture dislocation of ankle 12/04/2018   Type III open trimalleolar fracture of ankle 12/04/2018   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[place]) 12/04/2018   Chronic anticoagulation 12/04/2018   Anemia of chronic disease 12/04/2018   Pressure injury of skin  05/12/2018   Atrial fibrillation with RVR ([place]) 05/11/2018   Obesity, Class III, BMI 40-49.9 (morbid obesity) ([place]) 05/11/2018   Essential hypertension 05/11/2018   Renal insufficiency 05/11/2018   Permanent atrial fibrillation ([place]) 05/11/2018   Acute respiratory failure with hypoxia and hypercapnia ([place])  05/11/2018   Elevated troponin 05/11/2018   PCP:  Patient, No Pcp Per (Inactive) Pharmacy:   [facility] [place] [facility] [place] [address] [place] [address] Phone: [phone] Fax: [phone]     Social Determinants of Health ([place]) Interventions  No SDOH interventions needed or requested at this time  Readmission Risk Interventions No flowsheet data found.

## 2020-11-24 NOTE — Progress Notes (Addendum)
PROGRESS NOTE  Jean Stewart [id number] DOB: 1945/07/16 DOA: 11/16/2020 PCP: Patient, No Pcp Per (Inactive)  HPI/Recap of past 24 hours: Pccm pick up 9/18 75/F chronically ill from [facility], morbidly obese with BMI of 62, debilitated, COPD, chronic systolic and diastolic CHF, chronic respiratory failure on 3 L O2, OSA/OHS, hypertension, paroxysmal A. fib on Eliquis was brought to the ED with mental status changes, initially code stroke was suspected, on further work-up noted to have severe hypercarbia with CO2 narcosis. -She was admitted to the ICU, intubated and placed on mechanical ventilatory support, also seen by neurology, acute stroke was not suspected, also had AKI, and treated with 5 days of IV antibiotics. -Extubated on 9/16 to BiPAP -Transferred to [facility] service 9/18  Subjective -Apparently used BiPAP for 2 to 3 hours overnight  Assessment/Plan:  Acute on chronic hypercarbic respiratory failure History of COPD Acute diastolic CHF, pulmonary edema Morbid obesity, OSA/OHS -Extubated to BiPAP on 9/16 -Completed 5 days of IV ceftriaxone in the ICU -Diuresed with IV Lasix yesterday, 1.8 L urine output recorded,, 2D echo with preserved EF, last x-ray 9/17 with interstitial edema, monitor urine output, kidney function -Repeat Lasix IV today, change to p.o. from tomorrow, BMP in a.m. -Used BiPAP for 3 hours yesterday, emphasized critical importance of compliance -PT OT eval -TOC consult for trilogy, NIPPV   Acute metabolic encephalopathy -Secondary to CO2 narcosis, ABG with severe hypercarbia on admission -EEG noted metabolic encephalopathy, CT head without acute findings  Hypotension -Started on midodrine in the ICU, suspect this could have been related to sedation -Cut down midodrine dose -Cultures are negative, -Completed 5 days of IV ceftriaxone  AKI on CKD 3a -Baseline creatinine around 1.6-2, creatinine peaked to 2.4 this admission -Now down to 1.4  which is better than her baseline, monitor with diuresis  Paroxysmal atrial fibrillation with RVR -Heart rate relatively controlled, continue metoprolol, changed IV heparin back to Eliquis  Morbid obesity, debility -Mostly nonambulatory at baseline, has very limited muscle tone in lower legs, daughter thinks she walked with a walker about a year ago when she saw her last -PT OT following, not able to stand, SNF recommended  DVT prophylaxis,  Eliquis  Code Status: Full  Family Communication: Daughter Jean Stewart at bedside  Disposition Plan:   status is: Inpatient   Dispo: The patient is from: SNF              Anticipated d/c is to: SNF              Anticipated d/c date is: Likely 1 to 2 days              Patient currently not medically stable for discharge  Consultants: PCCM /critical care  Procedures: Intubation  Antimicrobials: Ceftriaxone  Objective: Vitals:   11/23/20 2011 11/23/20 2301 11/24/20 0505 11/24/20 0931  BP: (!) 121/58  128/74 (!) 109/49  Pulse: 88 86 98 86  Resp: '18 16 18 17  '$ Temp: 99 F (37.2 C)  98.5 F (36.9 C) 97.9 F (36.6 C)  TempSrc: Oral  Oral Oral  SpO2: 99% 98% 100% 100%  Weight:      Height:        Intake/Output Summary (Last 24 hours) at 11/24/2020 1212 Last data filed at 11/24/2020 0347 Gross per 24 hour  Intake 460 ml  Output 1875 ml  Net -1415 ml   Filed Weights   11/19/20 0500 11/21/20 0205 11/22/20 0500  Weight: (!) 173 kg (!) 164 kg Marland Kitchen)  168 kg   Body mass index is 61.63 kg/m.  Exam:  Gen: Morbidly obese chronically ill female lying in bed, awake, more alert today, oriented to self and place HEENT: Neck obese unable to assess JVD CVS: S1-S2, regular rate rhythm Lungs: Improving air movement, few basilar rales Abdomen: Soft, obese, nontender, bowel sounds present Extremities: No edema Skin: no new rashes on exposed skin     Data Reviewed: CBC: Recent Labs  Lab 11/20/20 0435 11/20/20 0812 11/20/20 1207  11/21/20 0407 11/22/20 0352 11/23/20 0040 11/24/20 0703  WBC 7.5  --   --  6.0 5.8 5.5 3.7*  HGB 10.9*   < > 10.9* 10.5* 10.4* 10.1* 9.7*  HCT 33.3*   < > 32.0* 33.5* 34.3* 32.4* 32.0*  MCV 99.4  --   --  104.0* 104.9* 105.2* 104.6*  PLT 146*  --   --  148* 168 166 159   < > = values in this interval not displayed.   Basic Metabolic Panel: Recent Labs  Lab 11/17/20 1900 11/18/20 0343 11/18/20 1058 11/18/20 2207 11/19/20 0400 11/20/20 0435 11/20/20 0812 11/20/20 1207 11/21/20 0407 11/22/20 0352 11/23/20 0824 11/24/20 0703  NA 137  --    < >  --  139 139   < > 137 138 139 137 138  K 4.2  --    < >  --  3.7 4.1   < > 4.3 4.9 4.6 4.4 4.4  CL 99  --    < >  --  100 99  --   --  99 96* 95* 97*  CO2 28  --    < >  --  28 27  --   --  30 32 33* 33*  GLUCOSE 110*  --    < >  --  138* 146*  --   --  110* 87 89 94  BUN 28*  --    < >  --  29* 28*  --   --  29* 27* 26* 24*  CREATININE 2.24*  --    < >  --  1.75* 1.56*  --   --  1.60* 1.59* 1.61* 1.45*  CALCIUM 8.5*  --    < >  --  8.2* 8.8*  --   --  8.6* 8.9 9.0 9.1  MG 2.1 2.1  --  2.1 2.1 2.1  --   --   --   --   --   --   PHOS 2.5 2.4*  --  3.2 3.2  --   --   --   --   --   --   --    < > = values in this interval not displayed.   GFR: Estimated Creatinine Clearance: 52 mL/min (A) (by C-G formula based on SCr of 1.45 mg/dL (H)). Liver Function Tests: Recent Labs  Lab 11/23/20 0824  AST 18  ALT 21  ALKPHOS 83  BILITOT 0.7  PROT 6.7  ALBUMIN 3.1*   No results for input(s): LIPASE, AMYLASE in the last 168 hours. Recent Labs  Lab 11/23/20 0824  AMMONIA 35   Coagulation Profile: No results for input(s): INR, PROTIME in the last 168 hours.  Cardiac Enzymes: No results for input(s): CKTOTAL, CKMB, CKMBINDEX, TROPONINI in the last 168 hours. BNP (last 3 results) No results for input(s): PROBNP in the last 8760 hours. HbA1C: No results for input(s): HGBA1C in the last 72 hours. CBG: Recent Labs  Lab 11/22/20 1940  11/23/20  0005 11/23/20 0444 11/23/20 0836 11/23/20 1147  GLUCAP 91 89 97 83 96   Lipid Profile: Recent Labs    11/24/20 0703  TRIG 79   Thyroid Function Tests: No results for input(s): TSH, T4TOTAL, FREET4, T3FREE, THYROIDAB in the last 72 hours. Anemia Panel: No results for input(s): VITAMINB12, FOLATE, FERRITIN, TIBC, IRON, RETICCTPCT in the last 72 hours. Urine analysis:    Component Value Date/Time   COLORURINE YELLOW 11/17/2020 0400   APPEARANCEUR CLEAR 11/17/2020 0400   LABSPEC 1.012 11/17/2020 0400   PHURINE 5.0 11/17/2020 0400   GLUCOSEU NEGATIVE 11/17/2020 0400   HGBUR NEGATIVE 11/17/2020 0400   BILIRUBINUR NEGATIVE 11/17/2020 0400   KETONESUR NEGATIVE 11/17/2020 0400   PROTEINUR NEGATIVE 11/17/2020 0400   NITRITE NEGATIVE 11/17/2020 0400   LEUKOCYTESUR NEGATIVE 11/17/2020 0400   Sepsis Labs: '@LABRCNTIP'$ (procalcitonin:4,lacticidven:4)  ) Recent Results (from the past 240 hour(s))  Resp Panel by RT-PCR (Flu A&B, Covid) Nasopharyngeal Swab     Status: None   Collection Time: 11/16/20 10:56 PM   Specimen: Nasopharyngeal Swab; Nasopharyngeal(NP) swabs in vial transport medium  Result Value Ref Range Status   SARS Coronavirus 2 by RT PCR NEGATIVE NEGATIVE Final    Comment: (NOTE) SARS-CoV-2 target nucleic acids are NOT DETECTED.  The SARS-CoV-2 RNA is generally detectable in upper respiratory specimens during the acute phase of infection. The lowest concentration of SARS-CoV-2 viral copies this assay can detect is 138 copies/mL. A negative result does not preclude SARS-Cov-2 infection and should not be used as the sole basis for treatment or other patient management decisions. A negative result may occur with  improper specimen collection/handling, submission of specimen other than nasopharyngeal swab, presence of viral mutation(s) within the areas targeted by this assay, and inadequate number of viral copies(<138 copies/mL). A negative result must be combined  with clinical observations, patient history, and epidemiological information. The expected result is Negative.  Fact Sheet for Patients:  EntrepreneurPulse.com.au  Fact Sheet for Healthcare Providers:  IncredibleEmployment.be  This test is no t yet approved or cleared by the [place] FDA and  has been authorized for detection and/or diagnosis of SARS-CoV-2 by FDA under an Emergency Use Authorization (EUA). This EUA will remain  in effect (meaning this test can be used) for the duration of the COVID-19 declaration under Section 564(b)(1) of the Act, 21 U.S.C.section 360bbb-3(b)(1), unless the authorization is terminated  or revoked sooner.       Influenza A by PCR NEGATIVE NEGATIVE Final   Influenza B by PCR NEGATIVE NEGATIVE Final    Comment: (NOTE) The Xpert Xpress SARS-CoV-2/FLU/RSV plus assay is intended as an aid in the diagnosis of influenza from Nasopharyngeal swab specimens and should not be used as a sole basis for treatment. Nasal washings and aspirates are unacceptable for Xpert Xpress SARS-CoV-2/FLU/RSV testing.  Fact Sheet for Patients: EntrepreneurPulse.com.au  Fact Sheet for Healthcare Providers: IncredibleEmployment.be  This test is not yet approved or cleared by the [place] FDA and has been authorized for detection and/or diagnosis of SARS-CoV-2 by FDA under an Emergency Use Authorization (EUA). This EUA will remain in effect (meaning this test can be used) for the duration of the COVID-19 declaration under Section 564(b)(1) of the Act, 21 U.S.C. section 360bbb-3(b)(1), unless the authorization is terminated or revoked.  Performed at [facility], [place] [address] [place], [place] [address]   MRSA Next Gen by PCR, Nasal     Status: None   Collection Time: 11/17/20  3:48 AM  Specimen: Nasal Mucosa; Nasal Swab  Result Value Ref Range Status   MRSA by PCR Next Gen NOT  DETECTED NOT DETECTED Final    Comment: (NOTE) The GeneXpert MRSA Assay (FDA approved for NASAL specimens only), is one component of a comprehensive MRSA colonization surveillance program. It is not intended to diagnose MRSA infection nor to guide or monitor treatment for MRSA infections. Test performance is not FDA approved in patients less than 76 years old. Performed at [facility], [place] [address] [place], [place] [address]   Urine Culture     Status: None   Collection Time: 11/17/20  4:00 AM   Specimen: In/Out Cath Urine  Result Value Ref Range Status   Specimen Description IN/OUT CATH URINE  Final   Special Requests NONE  Final   Culture   Final    NO GROWTH Performed at [facility], [place] [address] [place], [place] [address]    Report Status 11/18/2020 FINAL  Final  Culture, blood (Routine X 2) w Reflex to ID Panel     Status: None   Collection Time: 11/17/20  7:16 AM   Specimen: BLOOD LEFT HAND  Result Value Ref Range Status   Specimen Description BLOOD LEFT HAND  Final   Special Requests   Final    BOTTLES DRAWN AEROBIC ONLY Blood Culture results may not be optimal due to an inadequate volume of blood received in culture bottles   Culture   Final    NO GROWTH 5 DAYS Performed at [facility], [place] [address] [place], [place] [address]    Report Status 11/22/2020 FINAL  Final  Culture, blood (Routine X 2) w Reflex to ID Panel     Status: None   Collection Time: 11/17/20  7:34 AM   Specimen: BLOOD RIGHT HAND  Result Value Ref Range Status   Specimen Description BLOOD RIGHT HAND  Final   Special Requests   Final    BOTTLES DRAWN AEROBIC ONLY Blood Culture results may not be optimal due to an inadequate volume of blood received in culture bottles   Culture   Final    NO GROWTH 5 DAYS Performed at [facility], [place] [address] [place], [place] [address]    Report Status 11/22/2020 FINAL  Final  Culture, Respiratory w Gram Stain     Status:  None   Collection Time: 11/18/20 12:10 PM   Specimen: Tracheal Aspirate; Respiratory  Result Value Ref Range Status   Specimen Description TRACHEAL ASPIRATE  Final   Special Requests NONE  Final   Gram Stain   Final    MODERATE WBC PRESENT,BOTH PMN AND MONONUCLEAR FEW GRAM POSITIVE COCCI RARE GRAM VARIABLE ROD    Culture   Final    FEW Normal respiratory flora-no Staph aureus or Pseudomonas seen Performed at [facility], [address], [place], [place] [address]    Report Status 11/20/2020 FINAL  Final  Culture, Respiratory w Gram Stain     Status: None   Collection Time: 11/20/20  8:48 AM   Specimen: Tracheal Aspirate; Respiratory  Result Value Ref Range Status   Specimen Description TRACHEAL ASPIRATE  Final   Special Requests NONE  Final   Gram Stain   Final    MODERATE WBC PRESENT,BOTH PMN AND MONONUCLEAR RARE GRAM POSITIVE COCCI IN PAIRS    Culture   Final    RARE Normal respiratory flora-no Staph aureus or Pseudomonas seen Performed at [facility], [place] [address], [place], [place] [address]    Report Status 11/22/2020 FINAL  Final      Studies: No results found.  Scheduled Meds:  apixaban  5 mg Oral BID   Chlorhexidine Gluconate Cloth  6 each Topical Daily   docusate sodium  100 mg Oral BID   feeding supplement  237 mL Oral BID BM   furosemide  40 mg Intravenous Daily   mouth rinse  15 mL Mouth Rinse BID   metoprolol tartrate  25 mg Oral Q8H   midodrine  5 mg Oral BID WC   multivitamin with minerals  1 tablet Oral Daily   polyethylene glycol  17 g Oral Daily   sodium chloride flush  10-40 mL Intracatheter Q12H    Continuous Infusions:  sodium chloride 250 mL (11/23/20 1103)     LOS: 7 days   Time: 50mn  PDomenic Polite MD [facility]   11/24/2020, 12:12 PM

## 2020-11-25 DIAGNOSIS — J9602 Acute respiratory failure with hypercapnia: Secondary | ICD-10-CM | POA: Diagnosis not present

## 2020-11-25 LAB — BASIC METABOLIC PANEL
Anion gap: 10 (ref 5–15)
BUN: 20 mg/dL (ref 8–23)
CO2: 31 mmol/L (ref 22–32)
Calcium: 9.1 mg/dL (ref 8.9–10.3)
Chloride: 96 mmol/L — ABNORMAL LOW (ref 98–111)
Creatinine, Ser: 1.4 mg/dL — ABNORMAL HIGH (ref 0.44–1.00)
GFR, Estimated: 39 mL/min — ABNORMAL LOW (ref >60)
Glucose, Bld: 94 mg/dL (ref 70–99)
Potassium: 4.1 mmol/L (ref 3.5–5.1)
Sodium: 137 mmol/L (ref 135–145)

## 2020-11-25 LAB — CBC
HCT: 31.7 % — ABNORMAL LOW (ref 36.0–46.0)
Hemoglobin: 9.9 g/dL — ABNORMAL LOW (ref 12.0–15.0)
MCH: 31.7 pg (ref 26.0–34.0)
MCHC: 31.2 g/dL (ref 30.0–36.0)
MCV: 101.6 fL — ABNORMAL HIGH (ref 80.0–100.0)
Platelets: 165 10*3/uL (ref 150–400)
RBC: 3.12 MIL/uL — ABNORMAL LOW (ref 3.87–5.11)
RDW: 13.2 % (ref 11.5–15.5)
WBC: 4.2 10*3/uL (ref 4.0–10.5)
nRBC: 0 % (ref 0.0–0.2)

## 2020-11-25 LAB — SARS CORONAVIRUS 2 (TAT 6-24 HRS): SARS Coronavirus 2: NEGATIVE

## 2020-11-25 MED ORDER — POLYETHYLENE GLYCOL 3350 17 G PO PACK: 17.0000 g | PACK | Freq: Every day | ORAL | 0 refills | Status: DC

## 2020-11-25 MED ORDER — METOPROLOL SUCCINATE ER 100 MG PO TB24: 50.0000 mg | ORAL_TABLET | Freq: Every day | ORAL | 0 refills | Status: DC

## 2020-11-25 MED ORDER — METOPROLOL SUCCINATE ER 50 MG PO TB24
50.0000 mg | ORAL_TABLET | Freq: Every day | ORAL | Status: DC
  Administered 2020-11-25 – 2020-11-27 (×3): 50 mg via ORAL
  Filled 2020-11-25 (×3): qty 1

## 2020-11-25 NOTE — Discharge Summary (Signed)
Physician Discharge Summary  [name] [id number] DOB: 10/15/1945 DOA: 11/16/2020  PCP: Patient, No Pcp Per (Inactive)  Admit date: 11/16/2020 Discharge date: 11/25/2020  Time spent: 46mnutes  Recommendations for Outpatient Follow-up:  PCP in 1 week Ensure compliance with BiPAP nightly and long daytime naps, caution with sedating meds Recommend palliative care follow-up at SNF  Discharge Diagnoses:  Active Problems: Acute on chronic hypercarbic respiratory failure History of COPD Acute on chronic diastolic CHF Pulmonary edema Morbid obesity, BMI 62 OSA/OHS Acute metabolic encephalopathy Hypotension AKI on CKD 3a Paroxysmal atrial fibrillation   Hypoxia   Acute on chronic respiratory failure with hypoxia and hypercapnia (HCC)   AKI (acute kidney injury) ([facility]   Hyperkalemia   Acute encephalopathy   Discharge Condition: Stable  Diet recommendation: Low-sodium, heart healthy  Filed Weights   11/21/20 0205 11/22/20 0500 11/24/20 2126  Weight: (!) 164 kg (!) 168 kg (!) 168 kg    History of present illness:  75[facility], morbidly obese with BMI of 62, debilitated, COPD, chronic systolic and diastolic CHF, chronic respiratory failure on 3 L O2, OSA/OHS, hypertension, paroxysmal A. fib on Eliquis was brought to the ED with mental status changes, initially code stroke was suspected, on further work-up noted to have severe hypercarbia with CO2 narcosis.  Hospital Course:   Acute on chronic hypercarbic respiratory failure History of COPD Acute diastolic CHF, pulmonary edema Morbid obesity, OSA/OHS -admitted to the ICU with severe hypercarbic respiratory failure, CO2 99, with respiratory acidosis, intubated and placed on mechanical ventilatory support -Extubated to BiPAP on 9/16 -Completed 5 days of IV ceftriaxone in the ICU -Diuresed with IV Lasix x2days, she is -2.4 L, clinically appears somewhat euvolemic but volume status very  difficult to assess with morbid obesity  -Changed to p.o. Lasix 40 mg daily -Continue BiPAP nightly, emphasized compliance  -PT OT eval completed, SNF recommended  -Discharge to SNF for long-term care -Recommend palliative care follow-up   Acute metabolic encephalopathy -Secondary to CO2 narcosis, ABG with severe hypercarbia on admission -EEG noted metabolic encephalopathy, CT head without acute findings  Hypotension -Started on midodrine in the ICU, suspect this could have been related to sedation -Cut down and discontinued midodrine -Cultures are negative, -Completed 5 days of IV ceftriaxone   AKI on CKD 3a -Baseline creatinine around 1.6-2, creatinine peaked to 2.4 this admission -Now down to 1.4 which is better than her baseline   Paroxysmal atrial fibrillation with RVR -Heart rate relatively controlled, continue metoprolol, changed IV heparin back to Eliquis   Morbid obesity-BMI 62, debility -Mostly nonambulatory at baseline, has very limited muscle tone in lower legs, daughter thinks she walked with a walker about a year ago when she saw her last -PT OT following, not able to stand, SNF recommended   DVT prophylaxis,  Eliquis   Code Status: Full  Discharge Exam: Vitals:   11/24/20 2126 11/25/20 0553  BP: 120/61 121/69  Pulse: (!) 101 94  Resp: 16 16  Temp: 98.1 F (36.7 C) 98.7 F (37.1 C)  SpO2: 99% 99%   Gen: Morbidly obese chronically ill female laying in bed, BiPAP on, awake and more alert today, some confusion noted, oriented to self and partly to place only HEENT: Neck obese unable to assess JVD CVS: S1-S2, regular rate rhythm Lungs: Decreased breath sounds to bases Abdomen: Obese, soft, nontender, bowel sounds present  Extremities: No edema Skin: no new rashes on exposed skin     Discharge Instructions  Allergies as of 11/25/2020   No Known Allergies      Medication List     STOP taking these medications    gabapentin 100 MG  capsule Commonly known as: NEURONTIN   sulfamethoxazole-trimethoprim 800-160 MG tablet Commonly known as: BACTRIM DS       TAKE these medications    acetaminophen 325 MG tablet Commonly known as: TYLENOL Take 650 mg by mouth every 4 (four) hours as needed for fever.   apixaban 5 MG Tabs tablet Commonly known as: ELIQUIS Take 5 mg by mouth 2 (two) times daily.   Biofreeze 4 % Gel Generic drug: Menthol (Topical Analgesic) Apply 1 application topically 2 (two) times daily as needed (pain).   furosemide 40 MG tablet Commonly known as: LASIX Take 40 mg by mouth daily. What changed: Another medication with the same name was removed. Continue taking this medication, and follow the directions you see here.   metoprolol succinate 100 MG 24 hr tablet Commonly known as: TOPROL-XL Take 0.5 tablets (50 mg total) by mouth daily. Take with or immediately following a meal. What changed: how much to take   nystatin powder Commonly known as: MYCOSTATIN/NYSTOP Apply 1 application topically in the morning and at bedtime. Apply to right axilla/under breast   polyethylene glycol 17 g packet Commonly known as: MIRALAX / GLYCOLAX Take 17 g by mouth daily.   saccharomyces boulardii 250 MG capsule Commonly known as: FLORASTOR Take 250 mg by mouth 2 (two) times daily.   ZINC OXIDE EX Apply 1 application topically in the morning and at bedtime. To right buttock       No Known Allergies    The results of significant diagnostics from this hospitalization (including imaging, microbiology, ancillary and laboratory) are listed below for reference.    Significant Diagnostic Studies: CT HEAD WO CONTRAST (5MM)  Result Date: 11/17/2020 CLINICAL DATA:  Mental status change, persistent or worsening EXAM: CT HEAD WITHOUT CONTRAST TECHNIQUE: Contiguous axial images were obtained from the base of the skull through the vertex without intravenous contrast. COMPARISON:  CT head 11/16/2020. FINDINGS:  Brain: No evidence of acute or vascular territory infarction, hemorrhage, hydrocephalus, extra-axial collection or mass lesion/mass effect. Similar patchy white matter hypoattenuation, nonspecific but likely related to chronic microvascular ischemic disease. Similar mild for age atrophy. Probable tiny remote right inferior cerebellar lacunar infarct (series 4, image 6). Dural ossification. Partially empty sella. Vascular: No hyperdense vessel identified. Calcific intracranial atherosclerosis. Skull: No acute fracture. Sinuses/Orbits: Mild paranasal sinus mucosal thickening. Air-fluid level in the left sphenoid sinus, potentially in part due to intubation. Other: No sizable mastoid effusions. IMPRESSION: 1. No evidence of acute intracranial abnormality. 2. Similar chronic microvascular disease and atrophy. 3. 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 Electronically Signed   By: Margaretha Sheffield M.D.   On: 11/17/2020 14:21   DG Chest Port 1 View  Result Date: 11/21/2020 CLINICAL DATA:  Respiratory failure. EXAM: PORTABLE CHEST 1 VIEW COMPARISON:  11/20/2020 FINDINGS: 0535 hours. The cardio pericardial silhouette is enlarged. Endotracheal and NG tubes have been removed in the interval. Interval increase in prominence of the mediastinum and transverse aorta, potentially related to rotation. Vascular congestion noted with diffuse interstitial opacity suggesting edema. Bibasilar atelectasis/infiltrate is similar to prior. IMPRESSION: 1. Cardiomegaly with vascular congestion and interstitial edema. 2. Bibasilar atelectasis or infiltrate. Electronically Signed   By: Misty Stanley M.D.   On: 11/21/2020 09:02   DG Chest Port 1 View  Result Date: 11/20/2020 CLINICAL DATA:  Hypoxia  EXAM: PORTABLE CHEST 1 VIEW COMPARISON:  11/17/2020 FINDINGS: Support devices are unchanged. Cardiomegaly. Bilateral perihilar and lower lobe opacities have worsened since prior study, likely edema. Suspect small effusions.  IMPRESSION: Increasing perihilar and lower lobe opacities, favor edema/CHF. Small bilateral effusions. Electronically Signed   By: Rolm Baptise M.D.   On: 11/20/2020 08:57   DG CHEST PORT 1 VIEW  Result Date: 11/17/2020 CLINICAL DATA:  Central line placement. EXAM: PORTABLE CHEST 1 VIEW COMPARISON:  Chest x-ray 11/17/2020. FINDINGS: No central line visualized. Endotracheal tube tip approximately 4.7 cm above the carina. Enteric tube is present, but distal tip is not confirmed on this study. Side hole is unchanged in position. The heart is enlarged, unchanged. There is central pulmonary vascular congestion. There are patchy bibasilar infiltrates, increasing on the right. There are likely small bilateral pleural effusions, unchanged. There is no evidence for pneumothorax. No acute fractures are seen. IMPRESSION: 1. No central line visualized.  No evidence for pneumothorax. 2. Cardiomegaly with central pulmonary vascular congestion. 3. Increasing bibasilar infiltrates with stable pleural effusions. Electronically Signed   By: Ronney Asters M.D.   On: 11/17/2020 19:12   DG Chest Portable 1 View  Result Date: 11/17/2020 CLINICAL DATA:  Tube placement. EXAM: PORTABLE CHEST 1 VIEW COMPARISON:  November 16, 2020 FINDINGS: An endotracheal tube is seen with its distal tip approximately 3.6 cm from the carina. A nasogastric tube is noted with its distal end extending into the body of the stomach. The distal side hole is approximately 4.4 cm distal to the gastroesophageal junction. Mild atelectasis is seen within the bilateral lung bases. There is no evidence of a pleural effusion or pneumothorax. The cardiac silhouette is mildly enlarged. There is moderate severity calcification of the aortic arch. The visualized skeletal structures are unremarkable. IMPRESSION: 1. Endotracheal tube and nasogastric tube in appropriate position. 2. Mild bibasilar atelectasis. Electronically Signed   By: Virgina Norfolk M.D.   On:  11/17/2020 01:56   DG Chest Port 1 View  Result Date: 11/16/2020 CLINICAL DATA:  Code stroke, possible sepsis, hypoxia EXAM: PORTABLE CHEST 1 VIEW COMPARISON:  12/03/2018 FINDINGS: Cardiomegaly with pulmonary vascular congestion and mild interstitial edema. Suspected small bilateral pleural effusions. No pneumothorax. IMPRESSION: Cardiomegaly with mild interstitial edema and suspected small bilateral pleural effusions. Electronically Signed   By: Julian Hy M.D.   On: 11/16/2020 22:25   DG Abd Portable 1V  Result Date: 11/22/2020 CLINICAL DATA:  75 year old female with abdominal pain. EXAM: PORTABLE ABDOMEN - 1 VIEW COMPARISON:  None. FINDINGS: The bowel gas pattern is normal. No dilated bowel loops are noted. Two oval dense structures within the abdomen are likely within bowel. IMPRESSION: No acute abnormality. Electronically Signed   By: Margarette Canada M.D.   On: 11/22/2020 15:36   EEG adult  Result Date: 11/17/2020 Lora Havens, MD     11/17/2020 12:38 PM Patient Name: Jean Stewart MRN: [id number] Epilepsy Attending: Lora Havens Referring Physician/Provider: Dr Lesleigh Noe Date: 11/17/2020 Duration: 24.15 mins Patient history: 75 year old woman who presented with altered mental status.  EEG to evaluate for seizures. Level of alertness: Awake, asleep AEDs during EEG study: Propofol Technical aspects: This EEG study was done with scalp electrodes positioned according to the 10-20 International system of electrode placement. Electrical activity was acquired at a sampling rate of '500Hz'$  and reviewed with a high frequency filter of '70Hz'$  and a low frequency filter of '1Hz'$ . EEG data were recorded continuously and digitally stored. Description: During awake state, no  clear posterior dominant rhythm was seen.  Sleep was characterized by sleep spindles (12 to 14 Hz), maximal frontocentral region. EEG showed continuous generalized predominantly 5 to 7 Hz theta as well as intermittent generalized  2 to 3 Hz delta slowing hyperventilation and photic stimulation were not performed.   ABNORMALITY - Continuous slow, generalized IMPRESSION: This study is suggestive of moderate diffuse encephalopathy, nonspecific etiology. No seizures or epileptiform discharges were seen throughout the recording. Lora Havens   ECHOCARDIOGRAM COMPLETE  Result Date: 11/17/2020    ECHOCARDIOGRAM REPORT   Patient Name:   Jean Stewart Date of Exam: 11/17/2020 Medical Rec #:  [id number]         Height:       65.0 in Accession #:    [id number]        Weight:       354.0 lb Date of Birth:  1945-03-09         BSA:          2.521 m Patient Age:    28 years          BP:           92/55 mmHg Patient Gender: F                 HR:           98 bpm. Exam Location:  Inpatient Procedure: 2D Echo, Color Doppler and Cardiac Doppler Indications:    TIA  History:        Patient has prior history of Echocardiogram examinations, most                 recent 05/12/2018. COPD, Arrythmias:Atrial Fibrillation; Risk                 Factors:Hypertension.  Sonographer:    Raquel Sarna Senior RDCS Referring Phys: [id number] [place]  Sonographer Comments: Technically difficult study due to patient body habitus; scanned supine on artificial respirator. IMPRESSIONS  1. Left ventricular ejection fraction, by estimation, is 55 to 60%. The left ventricle has normal function. The left ventricle has no regional wall motion abnormalities. There is mild concentric left ventricular hypertrophy. Left ventricular diastolic parameters are indeterminate. Elevated left ventricular end-diastolic pressure.  2. Right ventricular systolic function is normal. The right ventricular size is normal. There is moderately elevated pulmonary artery systolic pressure.  3. Left atrial size was mildly dilated.  4. Right atrial size was mildly dilated.  5. The mitral valve is normal in structure. Trivial mitral valve regurgitation. No evidence of mitral stenosis.  6. Tricuspid valve  regurgitation is mild to moderate.  7. The aortic valve is tricuspid. Aortic valve regurgitation is not visualized. No aortic stenosis is present.  8. Aortic dilatation noted. There is mild dilatation of the ascending aorta, measuring 41 mm.  9. The inferior vena cava is dilated in size with <50% respiratory variability, suggesting right atrial pressure of 15 mmHg. FINDINGS  Left Ventricle: Left ventricular ejection fraction, by estimation, is 55 to 60%. The left ventricle has normal function. The left ventricle has no regional wall motion abnormalities. The left ventricular internal cavity size was normal in size. There is  mild concentric left ventricular hypertrophy. Left ventricular diastolic parameters are indeterminate. Elevated left ventricular end-diastolic pressure. Right Ventricle: The right ventricular size is normal. No increase in right ventricular wall thickness. Right ventricular systolic function is normal. There is moderately elevated pulmonary artery systolic pressure. The tricuspid regurgitant velocity is 3.11 m/s, and  with an assumed right atrial pressure of 15 mmHg, the estimated right ventricular systolic pressure is [id number] mmHg. Left Atrium: Left atrial size was mildly dilated. Right Atrium: Right atrial size was mildly dilated. Pericardium: There is no evidence of pericardial effusion. Mitral Valve: The mitral valve is normal in structure. Trivial mitral valve regurgitation. No evidence of mitral valve stenosis. Tricuspid Valve: The tricuspid valve is normal in structure. Tricuspid valve regurgitation is mild to moderate. No evidence of tricuspid stenosis. Aortic Valve: The aortic valve is tricuspid. Aortic valve regurgitation is not visualized. No aortic stenosis is present. Pulmonic Valve: The pulmonic valve was normal in structure. Pulmonic valve regurgitation is not visualized. No evidence of pulmonic stenosis. Aorta: Aortic dilatation noted. There is mild dilatation of the ascending aorta,  measuring 41 mm. Venous: The inferior vena cava is dilated in size with less than 50% respiratory variability, suggesting right atrial pressure of 15 mmHg. IAS/Shunts: No atrial level shunt detected by color flow Doppler.  LEFT VENTRICLE PLAX 2D LVIDd:         4.00 cm  Diastology LVIDs:         2.70 cm  LV e' medial:    6.09 cm/s LV PW:         1.10 cm  LV E/e' medial:  14.1 LV IVS:        1.20 cm  LV e' lateral:   10.00 cm/s LVOT diam:     2.00 cm  LV E/e' lateral: 8.6 LV SV:         48 LV SV Index:   19 LVOT Area:     3.14 cm  RIGHT VENTRICLE RV S prime:     5.55 cm/s TAPSE (M-mode): 1.1 cm LEFT ATRIUM             Index       RIGHT ATRIUM           Index LA diam:        4.00 cm 1.59 cm/m  RA Area:     27.30 cm LA Vol (A2C):   98.0 ml 38.87 ml/m RA Volume:   78.20 ml  31.01 ml/m LA Vol (A4C):   68.5 ml 27.17 ml/m LA Biplane Vol: 82.2 ml 32.60 ml/m  AORTIC VALVE LVOT Vmax:   95.82 cm/s LVOT Vmean:  67.040 cm/s LVOT VTI:    0.151 m  AORTA Ao Root diam: 3.40 cm Ao Asc diam:  4.10 cm MITRAL VALVE               TRICUSPID VALVE MV Area (PHT): 4.26 cm    TR Peak grad:   38.7 mmHg MV Decel Time: 178 msec    TR Vmax:        311.00 cm/s MV E velocity: 85.70 cm/s                            SHUNTS                            Systemic VTI:  0.15 m                            Systemic Diam: 2.00 cm Skeet Latch MD Electronically signed by Skeet Latch MD Signature Date/Time: 11/17/2020/3:26:38 PM    Final    CT HEAD CODE STROKE WO CONTRAST  Result Date: 11/16/2020 CLINICAL  DATA:  Code stroke.  Left-sided weakness EXAM: CT HEAD WITHOUT CONTRAST TECHNIQUE: Contiguous axial images were obtained from the base of the skull through the vertex without intravenous contrast. COMPARISON:  None. FINDINGS: Brain: There is no mass, hemorrhage or extra-axial collection. The size and configuration of the ventricles and extra-axial CSF spaces are normal. The brain parenchyma is normal, without evidence of acute or chronic  infarction. Partially empty sella turcica. Vascular: No abnormal hyperdensity of the major intracranial arteries or dural venous sinuses. No intracranial atherosclerosis. Skull: The visualized skull base, calvarium and extracranial soft tissues are normal. Sinuses/Orbits: No fluid levels or advanced mucosal thickening of the visualized paranasal sinuses. No mastoid or middle ear effusion. The orbits are normal. ASPECTS [facility] Stroke Program Early CT Score) - Ganglionic level infarction (caudate, lentiform nuclei, internal capsule, insula, M1-M3 cortex): 7 - Supraganglionic infarction (M4-M6 cortex): 3 Total score (0-10 with 10 being normal): 10 IMPRESSION: 1. No acute intracranial abnormality. 2. ASPECTS is 10. These results were communicated to Dr. Lesleigh Noe at 9:25 pm on 11/16/2020 by text page via the [facility] messaging system. Electronically Signed   By: Ulyses Jarred M.D.   On: 11/16/2020 21:35   CT ANGIO HEAD NECK W WO CM (CODE STROKE)  Result Date: 11/16/2020 CLINICAL DATA:  Left-sided weakness EXAM: CT ANGIOGRAPHY HEAD AND NECK TECHNIQUE: Multidetector CT imaging of the head and neck was performed using the standard protocol during bolus administration of intravenous contrast. Multiplanar CT image reconstructions and MIPs were obtained to evaluate the vascular anatomy. Carotid stenosis measurements (when applicable) are obtained utilizing NASCET criteria, using the distal internal carotid diameter as the denominator. CONTRAST:  66m OMNIPAQUE IOHEXOL 350 MG/ML SOLN COMPARISON:  None. FINDINGS: CTA NECK FINDINGS SKELETON: There is no bony spinal canal stenosis. No lytic or blastic lesion. OTHER NECK: Normal pharynx, larynx and major salivary glands. No cervical lymphadenopathy. Enlarged heterogeneous thyroid gland. UPPER CHEST: No pneumothorax or pleural effusion. No nodules or masses. AORTIC ARCH: There is calcific atherosclerosis of the aortic arch. There is no aneurysm, dissection or hemodynamically  significant stenosis of the visualized portion of the aorta. Conventional 3 vessel aortic branching pattern. The visualized proximal subclavian arteries are widely patent. RIGHT CAROTID SYSTEM: Normal without aneurysm, dissection or stenosis. LEFT CAROTID SYSTEM: Normal without aneurysm, dissection or stenosis. VERTEBRAL ARTERIES: Left dominant configuration. Both origins are clearly patent. There is no dissection, occlusion or flow-limiting stenosis to the skull base (V1-V3 segments). CTA HEAD FINDINGS POSTERIOR CIRCULATION: --Vertebral arteries: Normal V4 segments. --Inferior cerebellar arteries: Normal. --Basilar artery: Normal. --Superior cerebellar arteries: Normal. --Posterior cerebral arteries (PCA): Normal. ANTERIOR CIRCULATION: --Intracranial internal carotid arteries: Normal. --Anterior cerebral arteries (ACA): Normal. Both A1 segments are present. Patent anterior communicating artery (a-comm). --Middle cerebral arteries (MCA): Normal. VENOUS SINUSES: As permitted by contrast timing, patent. ANATOMIC VARIANTS: None Review of the MIP images confirms the above findings. IMPRESSION: 1. No emergent large vessel occlusion or high-grade stenosis of the intracranial or cervical arteries. 2. Enlarged, heterogeneous thyroid gland. Recommend thyroid ultrasound (ref: J Am Coll Radiol. 2015 Feb;12(2): 143-50). Aortic Atherosclerosis (ICD10-I70.0). These results were called by telephone at the time of interpretation on 11/16/2020 at 9:48 pm to provider [facility], who verbally acknowledged these results. Electronically Signed   By: KUlyses JarredM.D.   On: 11/16/2020 21:48    Microbiology: Recent Results (from the past 240 hour(s))  Resp Panel by RT-PCR (Flu A&B, Covid) Nasopharyngeal Swab     Status: None   Collection Time: 11/16/20  10:56 PM   Specimen: Nasopharyngeal Swab; Nasopharyngeal(NP) swabs in vial transport medium  Result Value Ref Range Status   SARS Coronavirus 2 by RT PCR NEGATIVE NEGATIVE  Final    Comment: (NOTE) SARS-CoV-2 target nucleic acids are NOT DETECTED.  The SARS-CoV-2 RNA is generally detectable in upper respiratory specimens during the acute phase of infection. The lowest concentration of SARS-CoV-2 viral copies this assay can detect is 138 copies/mL. A negative result does not preclude SARS-Cov-2 infection and should not be used as the sole basis for treatment or other patient management decisions. A negative result may occur with  improper specimen collection/handling, submission of specimen other than nasopharyngeal swab, presence of viral mutation(s) within the areas targeted by this assay, and inadequate number of viral copies(<138 copies/mL). A negative result must be combined with clinical observations, patient history, and epidemiological information. The expected result is Negative.  Fact Sheet for Patients:  EntrepreneurPulse.com.au  Fact Sheet for Healthcare Providers:  IncredibleEmployment.be  This test is no t yet approved or cleared by the [place] FDA and  has been authorized for detection and/or diagnosis of SARS-CoV-2 by FDA under an Emergency Use Authorization (EUA). This EUA will remain  in effect (meaning this test can be used) for the duration of the COVID-19 declaration under Section 564(b)(1) of the Act, 21 U.S.C.section 360bbb-3(b)(1), unless the authorization is terminated  or revoked sooner.       Influenza A by PCR NEGATIVE NEGATIVE Final   Influenza B by PCR NEGATIVE NEGATIVE Final    Comment: (NOTE) The Xpert Xpress SARS-CoV-2/FLU/RSV plus assay is intended as an aid in the diagnosis of influenza from Nasopharyngeal swab specimens and should not be used as a sole basis for treatment. Nasal washings and aspirates are unacceptable for Xpert Xpress SARS-CoV-2/FLU/RSV testing.  Fact Sheet for Patients: EntrepreneurPulse.com.au  Fact Sheet for Healthcare  Providers: IncredibleEmployment.be  This test is not yet approved or cleared by the [place] FDA and has been authorized for detection and/or diagnosis of SARS-CoV-2 by FDA under an Emergency Use Authorization (EUA). This EUA will remain in effect (meaning this test can be used) for the duration of the COVID-19 declaration under Section 564(b)(1) of the Act, 21 U.S.C. section 360bbb-3(b)(1), unless the authorization is terminated or revoked.  Performed at [facility], [place] [address], [place], [place] [address]   MRSA Next Gen by PCR, Nasal     Status: None   Collection Time: 11/17/20  3:48 AM   Specimen: Nasal Mucosa; Nasal Swab  Result Value Ref Range Status   MRSA by PCR Next Gen NOT DETECTED NOT DETECTED Final    Comment: (NOTE) The GeneXpert MRSA Assay (FDA approved for NASAL specimens only), is one component of a comprehensive MRSA colonization surveillance program. It is not intended to diagnose MRSA infection nor to guide or monitor treatment for MRSA infections. Test performance is not FDA approved in patients less than 93 years old. Performed at [facility], [place] [address] [place], [place] [address]   Urine Culture     Status: None   Collection Time: 11/17/20  4:00 AM   Specimen: In/Out Cath Urine  Result Value Ref Range Status   Specimen Description IN/OUT CATH URINE  Final   Special Requests NONE  Final   Culture   Final    NO GROWTH Performed at [facility], [place] [address] [place], [place] [address]    Report Status 11/18/2020 FINAL  Final  Culture,  blood (Routine X 2) w Reflex to ID Panel     Status: None   Collection Time: 11/17/20  7:16 AM   Specimen: BLOOD LEFT HAND  Result Value Ref Range Status   Specimen Description BLOOD LEFT HAND  Final   Special Requests   Final    BOTTLES DRAWN AEROBIC ONLY Blood Culture results may not be optimal due to an inadequate volume of blood received in culture bottles    Culture   Final    NO GROWTH 5 DAYS Performed at [facility], [place] [address], [place], [place] [address]    Report Status 11/22/2020 FINAL  Final  Culture, blood (Routine X 2) w Reflex to ID Panel     Status: None   Collection Time: 11/17/20  7:34 AM   Specimen: BLOOD RIGHT HAND  Result Value Ref Range Status   Specimen Description BLOOD RIGHT HAND  Final   Special Requests   Final    BOTTLES DRAWN AEROBIC ONLY Blood Culture results may not be optimal due to an inadequate volume of blood received in culture bottles   Culture   Final    NO GROWTH 5 DAYS Performed at [facility], [place] [address], [place], [place] [address]    Report Status 11/22/2020 FINAL  Final  Culture, Respiratory w Gram Stain     Status: None   Collection Time: 11/18/20 12:10 PM   Specimen: Tracheal Aspirate; Respiratory  Result Value Ref Range Status   Specimen Description TRACHEAL ASPIRATE  Final   Special Requests NONE  Final   Gram Stain   Final    MODERATE WBC PRESENT,BOTH PMN AND MONONUCLEAR FEW GRAM POSITIVE COCCI RARE GRAM VARIABLE ROD    Culture   Final    FEW Normal respiratory flora-no Staph aureus or Pseudomonas seen Performed at [facility], [address] [place], [place] [address]    Report Status 11/20/2020 FINAL  Final  Culture, Respiratory w Gram Stain     Status: None   Collection Time: 11/20/20  8:48 AM   Specimen: Tracheal Aspirate; Respiratory  Result Value Ref Range Status   Specimen Description TRACHEAL ASPIRATE  Final   Special Requests NONE  Final   Gram Stain   Final    MODERATE WBC PRESENT,BOTH PMN AND MONONUCLEAR RARE GRAM POSITIVE COCCI IN PAIRS    Culture   Final    RARE Normal respiratory flora-no Staph aureus or Pseudomonas seen Performed at [facility], [address] [place], [place] [address]    Report Status 11/22/2020 FINAL  Final     Labs: Basic Metabolic Panel: Recent Labs  Lab 11/18/20 2207 11/19/20 0400 11/19/20 0400  11/20/20 0435 11/20/20 [id number] 11/21/20 0407 11/22/20 0352 11/23/20 0824 11/24/20 0703 11/25/20 0311  NA  --  139   < > 139   < > 138 139 137 138 137  K  --  3.7   < > 4.1   < > 4.9 4.6 4.4 4.4 4.1  CL  --  100   < > 99  --  99 96* 95* 97* 96*  CO2  --  28   < > 27  --  30 32 33* 33* 31  GLUCOSE  --  138*   < > 146*  --  110* 87 89 94 94  BUN  --  29*   < > 28*  --  29* 27* 26* 24* 20  CREATININE  --  1.75*   < > 1.56*  --  1.60* 1.59* 1.61* 1.45* 1.40*  CALCIUM  --  8.2*   < > 8.8*  --  8.6* 8.9 9.0 9.1 9.1  MG 2.1 2.1  --  2.1  --   --   --   --   --   --   PHOS 3.2 3.2  --   --   --   --   --   --   --   --    < > = values in this interval not displayed.   Liver Function Tests: Recent Labs  Lab 11/23/20 0824  AST 18  ALT 21  ALKPHOS 83  BILITOT 0.7  PROT 6.7  ALBUMIN 3.1*   No results for input(s): LIPASE, AMYLASE in the last 168 hours. Recent Labs  Lab 11/23/20 0824  AMMONIA 35   CBC: Recent Labs  Lab 11/21/20 0407 11/22/20 0352 11/23/20 0040 11/24/20 0703 11/25/20 0311  WBC 6.0 5.8 5.5 3.7* 4.2  HGB 10.5* 10.4* 10.1* 9.7* 9.9*  HCT 33.5* 34.3* 32.4* 32.0* 31.7*  MCV 104.0* 104.9* 105.2* 104.6* 101.6*  PLT 148* 168 166 159 165   Cardiac Enzymes: No results for input(s): CKTOTAL, CKMB, CKMBINDEX, TROPONINI in the last 168 hours. BNP: BNP (last 3 results) Recent Labs    11/16/20 2312  BNP 420.2*    ProBNP (last 3 results) No results for input(s): PROBNP in the last 8760 hours.  CBG: Recent Labs  Lab 11/22/20 1940 11/23/20 0005 11/23/20 0444 11/23/20 0836 11/23/20 1147  GLUCAP 91 89 97 83 96       Signed:  Domenic Polite MD.  [facility] 11/25/2020, 11:10 AM

## 2020-11-25 NOTE — Progress Notes (Signed)
Occupational Therapy Treatment Patient Details Name: Jean Stewart MRN: [id number] DOB: 14-Apr-1945 Today's Date: 11/25/2020   History of present illness Pt is a 75 year old woman admitted from SNF on 11/17/20 as code stroke with AMS and L side gaze. Found to have PC02 of 99. Mental status worsened requring intubation from 11/17/20 to 11/20/20. Head CT negative for acute changes. Hospital course complicated by AKI and hyperkalemia. PMH: obesity, afib, CHF, anemia, suspected sleep apnea, IBS, liver failure, PVD, neuropathy, pre-diabetes, COPD, arthritis.   OT comments  Pt progressing towards acute OT goals. Focus of today's session was establishing a BUE general strengthening exercise program using level 1 theraband. Daughter present throughout session. D/c plan remains appropriate.     Recommendations for follow up therapy are one component of a multi-disciplinary discharge planning process, led by the attending physician.  Recommendations may be updated based on patient status, additional functional criteria and insurance authorization.    Follow Up Recommendations  SNF;Supervision/Assistance - 24 hour    Equipment Recommendations  Other (comment) (defer to next venue)    Recommendations for Other Services      Precautions / Restrictions Precautions Precautions: Fall Restrictions Weight Bearing Restrictions: No       Mobility Bed Mobility                    Transfers                 General transfer comment: deferred, will require lift equipment    Balance                                           ADL either performed or assessed with clinical judgement   ADL                                         General ADL Comments: requires total assist     Vision       Perception     Praxis      Cognition Arousal/Alertness: Awake/alert Behavior During Therapy: WFL for tasks assessed/performed;Flat affect Overall  Cognitive Status: Impaired/Different from baseline Area of Impairment: Attention;Following commands;Problem solving                   Current Attention Level: Focused;Sustained   Following Commands: Follows one step commands inconsistently     Problem Solving: Decreased initiation;Slow processing General Comments: Decreased inititation, inconsistent one-step command following. short answers to questions        Exercises Exercises: Other exercises Other Exercises Other Exercises: level 1 theraband tied to each head rail and  horizontal adduction/flexion exercises compelte. Pt with better performance in RUE when reps are done bilaterally   Shoulder Instructions       General Comments      Pertinent Vitals/ Pain       Pain Assessment: Faces Faces Pain Scale: Hurts a little bit Pain Location: generalized Pain Descriptors / Indicators: Discomfort Pain Intervention(s): Monitored during session  Home Living                                          Prior Functioning/Environment  Frequency  Min 2X/week        Progress Toward Goals  OT Goals(current goals can now be found in the care plan section)  Progress towards OT goals: Progressing toward goals  Acute Rehab OT Goals Patient Stated Goal: pt daughter would like her cognition to improve OT Goal Formulation: Patient unable to participate in goal setting Time For Goal Achievement: 12/04/20 Potential to Achieve Goals: Fair ADL Goals Pt Will Perform Grooming: with min assist;sitting Pt Will Perform Upper Body Bathing: with mod assist;sitting Pt Will Perform Upper Body Dressing: with min assist;sitting Pt/caregiver will Perform Home Exercise Program: Increased strength;Both right and left upper extremity;With minimal assist Additional ADL Goal #1: Pt will perform bed mobility with moderate assistance in preparation for ADL. Additional ADL Goal #2: Pt will demonstrate fair sitting  balance x 10 minutes in preparation for ADL.  Plan Discharge plan remains appropriate    Co-evaluation                 AM-PAC OT "6 Clicks" Daily Activity     Outcome Measure   Help from another person eating meals?: Total Help from another person taking care of personal grooming?: Total Help from another person toileting, which includes using toliet, bedpan, or urinal?: Total Help from another person bathing (including washing, rinsing, drying)?: Total Help from another person to put on and taking off regular upper body clothing?: Total Help from another person to put on and taking off regular lower body clothing?: Total 6 Click Score: 6    End of Session    OT Visit Diagnosis: Muscle weakness (generalized) (M62.81)   Activity Tolerance Patient tolerated treatment well   Patient Left in bed;with call bell/phone within reach;with family/visitor present (daughter)   Nurse Communication          [id number] OT Time Calculation (min): 20 min  Charges: OT General Charges $OT Visit: 1 Visit OT Treatments $Therapeutic Exercise: 8-22 mins  Tyrone Schimke, OT Acute Rehabilitation Services Pager: [phone] Office: [phone]   Hortencia Pilar 11/25/2020, 2:17 PM

## 2020-11-25 NOTE — Progress Notes (Signed)
PROGRESS NOTE  [name] [id number] DOB: 1945/06/22 DOA: 11/16/2020 PCP: Patient, No Pcp Per (Inactive)  HPI/Recap of past 24 hours: Pccm pick up 9/18 75/F chronically ill from [facility], morbidly obese with BMI of 62, debilitated, COPD, chronic systolic and diastolic CHF, chronic respiratory failure on 3 L O2, OSA/OHS, hypertension, paroxysmal A. fib on Eliquis was brought to the ED with mental status changes, initially code stroke was suspected, on further work-up noted to have severe hypercarbia with CO2 narcosis. -She was admitted to the ICU, intubated and placed on mechanical ventilatory support, also seen by neurology, acute stroke was not suspected, also had AKI, and treated with 5 days of IV antibiotics. -Extubated on 9/16 to BiPAP -Transferred to [facility] service 9/18  Subjective -Finally used BiPAP most of the night, first time using it all night, per daughter was more confused overnight and earlier this morning  Assessment/Plan:  Acute on chronic hypercarbic respiratory failure History of COPD Acute diastolic CHF, pulmonary edema Morbid obesity, OSA/OHS -Extubated to BiPAP on 9/16 -Completed 5 days of IV ceftriaxone in the ICU -Diuresed with IV Lasix x2days, she is -2.4 L, clinically appears somewhat euvolemic but volume status very difficult to assess with morbid obesity  -Changed to p.o. Lasix  -Continue BiPAP nightly, emphasized compliance  -PT OT eval completed, SNF recommended  -Discharge planning, SNF soon   Acute metabolic encephalopathy -Secondary to CO2 narcosis, ABG with severe hypercarbia on admission -EEG noted metabolic encephalopathy, CT head without acute findings -Has been improving, per daughter slightly more confused today, does not look considerably different -Continue to monitor, not on sedating meds  Hypotension -Started on midodrine in the ICU, suspect this could have been related to sedation -Cut down and discontinued  midodrine -Cultures are negative, -Completed 5 days of IV ceftriaxone  AKI on CKD 3a -Baseline creatinine around 1.6-2, creatinine peaked to 2.4 this admission -Now down to 1.4 which is better than her baseline, monitor with diuresis  Paroxysmal atrial fibrillation with RVR -Heart rate relatively controlled, continue metoprolol, changed IV heparin back to Eliquis  Morbid obesity, debility -Mostly nonambulatory at baseline, has very limited muscle tone in lower legs, daughter thinks she walked with a walker about a year ago when she saw her last -PT OT following, not able to stand, SNF recommended  DVT prophylaxis,  Eliquis  Code Status: Full  Family Communication: Daughter [name] at bedside  Disposition Plan:   status is: Inpatient   Dispo: The patient is from: SNF              Anticipated d/c is to: SNF              Anticipated d/c date is: Likely 1 to 2 days              Patient currently not medically stable for discharge  Consultants: PCCM /critical care  Procedures: Intubation  Antimicrobials: Ceftriaxone  Objective: Vitals:   11/24/20 1722 11/24/20 2122 11/24/20 2126 11/25/20 0553  BP: (!) 111/54 120/61 120/61 121/69  Pulse: (!) 102 (!) 101 (!) 101 94  Resp: '16  16 16  '$ Temp: 98.2 F (36.8 C)  98.1 F (36.7 C) 98.7 F (37.1 C)  TempSrc:   Oral Oral  SpO2: 97%  99% 99%  Weight:   (!) 168 kg   Height:        Intake/Output Summary (Last 24 hours) at 11/25/2020 1102 Last data filed at 11/25/2020 0553 Gross per 24 hour  Intake 690  ml  Output 2501 ml  Net -1811 ml   Filed Weights   11/21/20 0205 11/22/20 0500 11/24/20 2126  Weight: (!) 164 kg (!) 168 kg (!) 168 kg   Body mass index is 61.64 kg/m.  Exam:  Gen: Morbidly obese chronically ill female laying in bed, BiPAP on, awake and more alert today, some confusion noted, oriented to self and partly to place only HEENT: Neck obese unable to assess JVD CVS: S1-S2, regular rate rhythm Lungs: Decreased  breath sounds to bases Abdomen: Obese, soft, nontender, bowel sounds present  Extremities: No edema Skin: no new rashes on exposed skin     Data Reviewed: CBC: Recent Labs  Lab 11/21/20 0407 11/22/20 0352 11/23/20 0040 11/24/20 0703 11/25/20 0311  WBC 6.0 5.8 5.5 3.7* 4.2  HGB 10.5* 10.4* 10.1* 9.7* 9.9*  HCT 33.5* 34.3* 32.4* 32.0* 31.7*  MCV 104.0* 104.9* 105.2* 104.6* 101.6*  PLT 148* 168 166 159 [id number]   Basic Metabolic Panel: Recent Labs  Lab 11/18/20 2207 11/19/20 0400 11/19/20 0400 11/20/20 0435 11/20/20 0812 11/21/20 0407 11/22/20 0352 11/23/20 0824 11/24/20 0703 11/25/20 0311  NA  --  139   < > 139   < > 138 139 137 138 137  K  --  3.7   < > 4.1   < > 4.9 4.6 4.4 4.4 4.1  CL  --  100   < > 99  --  99 96* 95* 97* 96*  CO2  --  28   < > 27  --  30 32 33* 33* 31  GLUCOSE  --  138*   < > 146*  --  110* 87 89 94 94  BUN  --  29*   < > 28*  --  29* 27* 26* 24* 20  CREATININE  --  1.75*   < > 1.56*  --  1.60* 1.59* 1.61* 1.45* 1.40*  CALCIUM  --  8.2*   < > 8.8*  --  8.6* 8.9 9.0 9.1 9.1  MG 2.1 2.1  --  2.1  --   --   --   --   --   --   PHOS 3.2 3.2  --   --   --   --   --   --   --   --    < > = values in this interval not displayed.   GFR: Estimated Creatinine Clearance: 53.9 mL/min (A) (by C-G formula based on SCr of 1.4 mg/dL (H)). Liver Function Tests: Recent Labs  Lab 11/23/20 0824  AST 18  ALT 21  ALKPHOS 83  BILITOT 0.7  PROT 6.7  ALBUMIN 3.1*   No results for input(s): LIPASE, AMYLASE in the last 168 hours. Recent Labs  Lab 11/23/20 0824  AMMONIA 35   Coagulation Profile: No results for input(s): INR, PROTIME in the last 168 hours.  Cardiac Enzymes: No results for input(s): CKTOTAL, CKMB, CKMBINDEX, TROPONINI in the last 168 hours. BNP (last 3 results) No results for input(s): PROBNP in the last 8760 hours. HbA1C: No results for input(s): HGBA1C in the last 72 hours. CBG: Recent Labs  Lab 11/22/20 1940 11/23/20 0005  11/23/20 0444 11/23/20 0836 11/23/20 1147  GLUCAP 91 89 97 83 96   Lipid Profile: Recent Labs    11/24/20 0703  TRIG 79   Thyroid Function Tests: No results for input(s): TSH, T4TOTAL, FREET4, T3FREE, THYROIDAB in the last 72 hours. Anemia Panel: No results for input(s): VITAMINB12, FOLATE, FERRITIN,  TIBC, IRON, RETICCTPCT in the last 72 hours. Urine analysis:    Component Value Date/Time   COLORURINE YELLOW 11/17/2020 0400   APPEARANCEUR CLEAR 11/17/2020 0400   LABSPEC 1.012 11/17/2020 0400   PHURINE 5.0 11/17/2020 0400   GLUCOSEU NEGATIVE 11/17/2020 0400   HGBUR NEGATIVE 11/17/2020 0400   BILIRUBINUR NEGATIVE 11/17/2020 0400   KETONESUR NEGATIVE 11/17/2020 0400   PROTEINUR NEGATIVE 11/17/2020 0400   NITRITE NEGATIVE 11/17/2020 0400   LEUKOCYTESUR NEGATIVE 11/17/2020 0400   Sepsis Labs: '@LABRCNTIP'$ (procalcitonin:4,lacticidven:4)  ) Recent Results (from the past 240 hour(s))  Resp Panel by RT-PCR (Flu A&B, Covid) Nasopharyngeal Swab     Status: None   Collection Time: 11/16/20 10:56 PM   Specimen: Nasopharyngeal Swab; Nasopharyngeal(NP) swabs in vial transport medium  Result Value Ref Range Status   SARS Coronavirus 2 by RT PCR NEGATIVE NEGATIVE Final    Comment: (NOTE) SARS-CoV-2 target nucleic acids are NOT DETECTED.  The SARS-CoV-2 RNA is generally detectable in upper respiratory specimens during the acute phase of infection. The lowest concentration of SARS-CoV-2 viral copies this assay can detect is 138 copies/mL. A negative result does not preclude SARS-Cov-2 infection and should not be used as the sole basis for treatment or other patient management decisions. A negative result may occur with  improper specimen collection/handling, submission of specimen other than nasopharyngeal swab, presence of viral mutation(s) within the areas targeted by this assay, and inadequate number of viral copies(<138 copies/mL). A negative result must be combined with clinical  observations, patient history, and epidemiological information. The expected result is Negative.  Fact Sheet for Patients:  EntrepreneurPulse.com.au  Fact Sheet for Healthcare Providers:  IncredibleEmployment.be  This test is no t yet approved or cleared by the [place] FDA and  has been authorized for detection and/or diagnosis of SARS-CoV-2 by FDA under an Emergency Use Authorization (EUA). This EUA will remain  in effect (meaning this test can be used) for the duration of the COVID-19 declaration under Section 564(b)(1) of the Act, 21 U.S.C.section 360bbb-3(b)(1), unless the authorization is terminated  or revoked sooner.       Influenza A by PCR NEGATIVE NEGATIVE Final   Influenza B by PCR NEGATIVE NEGATIVE Final    Comment: (NOTE) The Xpert Xpress SARS-CoV-2/FLU/RSV plus assay is intended as an aid in the diagnosis of influenza from Nasopharyngeal swab specimens and should not be used as a sole basis for treatment. Nasal washings and aspirates are unacceptable for Xpert Xpress SARS-CoV-2/FLU/RSV testing.  Fact Sheet for Patients: EntrepreneurPulse.com.au  Fact Sheet for Healthcare Providers: IncredibleEmployment.be  This test is not yet approved or cleared by the [place] FDA and has been authorized for detection and/or diagnosis of SARS-CoV-2 by FDA under an Emergency Use Authorization (EUA). This EUA will remain in effect (meaning this test can be used) for the duration of the COVID-19 declaration under Section 564(b)(1) of the Act, 21 U.S.C. section 360bbb-3(b)(1), unless the authorization is terminated or revoked.  Performed at [facility], [place] [address], [place], [place] [address]   MRSA Next Gen by PCR, Nasal     Status: None   Collection Time: 11/17/20  3:48 AM   Specimen: Nasal Mucosa; Nasal Swab  Result Value Ref Range Status   MRSA by PCR Next Gen NOT DETECTED NOT  DETECTED Final    Comment: (NOTE) The GeneXpert MRSA Assay (FDA approved for NASAL specimens only), is one component of a comprehensive MRSA colonization surveillance program. It is not intended to diagnose MRSA  infection nor to guide or monitor treatment for MRSA infections. Test performance is not FDA approved in patients less than 51 years old. Performed at [facility], [place] [address] [place], [place] [address]   Urine Culture     Status: None   Collection Time: 11/17/20  4:00 AM   Specimen: In/Out Cath Urine  Result Value Ref Range Status   Specimen Description IN/OUT CATH URINE  Final   Special Requests NONE  Final   Culture   Final    NO GROWTH Performed at [facility], [place] [address], [place], [place] [address]    Report Status 11/18/2020 FINAL  Final  Culture, blood (Routine X 2) w Reflex to ID Panel     Status: None   Collection Time: 11/17/20  7:16 AM   Specimen: BLOOD LEFT HAND  Result Value Ref Range Status   Specimen Description BLOOD LEFT HAND  Final   Special Requests   Final    BOTTLES DRAWN AEROBIC ONLY Blood Culture results may not be optimal due to an inadequate volume of blood received in culture bottles   Culture   Final    NO GROWTH 5 DAYS Performed at [facility], [place] [address], [place], [place] [address]    Report Status 11/22/2020 FINAL  Final  Culture, blood (Routine X 2) w Reflex to ID Panel     Status: None   Collection Time: 11/17/20  7:34 AM   Specimen: BLOOD RIGHT HAND  Result Value Ref Range Status   Specimen Description BLOOD RIGHT HAND  Final   Special Requests   Final    BOTTLES DRAWN AEROBIC ONLY Blood Culture results may not be optimal due to an inadequate volume of blood received in culture bottles   Culture   Final    NO GROWTH 5 DAYS Performed at [facility], [place] [address], [place], [place] [address]    Report Status 11/22/2020 FINAL  Final  Culture, Respiratory w Gram Stain     Status: None    Collection Time: 11/18/20 12:10 PM   Specimen: Tracheal Aspirate; Respiratory  Result Value Ref Range Status   Specimen Description TRACHEAL ASPIRATE  Final   Special Requests NONE  Final   Gram Stain   Final    MODERATE WBC PRESENT,BOTH PMN AND MONONUCLEAR FEW GRAM POSITIVE COCCI RARE GRAM VARIABLE ROD    Culture   Final    FEW Normal respiratory flora-no Staph aureus or Pseudomonas seen Performed at [facility], [address], [place], [place] [address]    Report Status 11/20/2020 FINAL  Final  Culture, Respiratory w Gram Stain     Status: None   Collection Time: 11/20/20  8:48 AM   Specimen: Tracheal Aspirate; Respiratory  Result Value Ref Range Status   Specimen Description TRACHEAL ASPIRATE  Final   Special Requests NONE  Final   Gram Stain   Final    MODERATE WBC PRESENT,BOTH PMN AND MONONUCLEAR RARE GRAM POSITIVE COCCI IN PAIRS    Culture   Final    RARE Normal respiratory flora-no Staph aureus or Pseudomonas seen Performed at [facility], [address], [place], [place] [address]    Report Status 11/22/2020 FINAL  Final      Studies: No results found.  Scheduled Meds:  apixaban  5 mg Oral BID   Chlorhexidine Gluconate Cloth  6 each Topical Daily   docusate sodium  100 mg Oral  BID   feeding supplement  237 mL Oral BID BM   furosemide  40 mg Oral Daily   mouth rinse  15 mL Mouth Rinse BID   metoprolol tartrate  25 mg Oral Q8H   multivitamin with minerals  1 tablet Oral Daily   polyethylene glycol  17 g Oral Daily   sodium chloride flush  10-40 mL Intracatheter Q12H    Continuous Infusions:  sodium chloride 250 mL (11/23/20 1103)     LOS: 8 days   Time: 23mn  PDomenic Polite MD [facility]   11/25/2020, 11:02 AM

## 2020-11-25 NOTE — TOC Progression Note (Signed)
Transition of Care [facility]) - Progression Note    Patient Details  Name: Laelia Ottoson MRN: [id number] Date of Birth: 10-Jul-1945  Transition of Care [facility]) CM/SW Contact  [name], LCSW Phone Number: 11/25/2020, 6:41 PM  Clinical Narrative:  Patient Long-Term Care (LTC) at [facility] Received call from admissions liasion, Juliann Pulse indicating that they want patient to return ST rehab and then transition back to LTC. CSW talked with a therapist this morning regarding patient and was informed that OT would see patient today and PT tomorrow and MD updated. CSW visited today with patient and daughter [name] regarding [facility]'s request and needing PT/OT to see her for insurance authorization and patient in agreement. Daughters questions addressed.     Expected Discharge Plan: [place]) Barriers to Discharge: Continued Medical Work up  Expected Discharge Plan and Services Expected Discharge Plan: [place] [facility]) In-house Referral: Clinical Social Work   Post Acute Care Choice: [place] Living arrangements for the past 2 months: [place]                                       Social Determinants of Health (SDOH) Interventions  No SDOH interventions requested or needed at this time  Readmission Risk Interventions No flowsheet data found.

## 2020-11-25 NOTE — Progress Notes (Signed)
Unable to obtain daily weight. Bed scale not functioning.

## 2020-11-26 LAB — BASIC METABOLIC PANEL
Anion gap: 6 (ref 5–15)
BUN: 17 mg/dL (ref 8–23)
CO2: 36 mmol/L — ABNORMAL HIGH (ref 22–32)
Calcium: 9 mg/dL (ref 8.9–10.3)
Chloride: 96 mmol/L — ABNORMAL LOW (ref 98–111)
Creatinine, Ser: 1.25 mg/dL — ABNORMAL HIGH (ref 0.44–1.00)
GFR, Estimated: 44 mL/min — ABNORMAL LOW (ref >60)
Glucose, Bld: 105 mg/dL — ABNORMAL HIGH (ref 70–99)
Potassium: 3.8 mmol/L (ref 3.5–5.1)
Sodium: 138 mmol/L (ref 135–145)

## 2020-11-26 NOTE — Plan of Care (Signed)
  Problem: Clinical Measurements: Goal: Ability to maintain clinical measurements within normal limits will improve Outcome: Progressing   Problem: Activity: Goal: Risk for activity intolerance will decrease Outcome: Progressing   Problem: Coping: Goal: Level of anxiety will decrease Outcome: Progressing   Problem: Safety: Goal: Ability to remain free from injury will improve Outcome: Progressing   

## 2020-11-26 NOTE — Progress Notes (Signed)
Physical Therapy Treatment Patient Details Name: Jean Stewart MRN: [id number] DOB: 1945/06/24 Today's Date: 11/26/2020   History of Present Illness Pt is a 75 year old woman admitted from SNF on 11/17/20 as code stroke with AMS and L side gaze. Found to have PC02 of 99. Mental status worsened requring intubation from 11/17/20 to 11/20/20. Head CT negative for acute changes. Hospital course complicated by AKI and hyperkalemia. PMH: obesity, afib, CHF, anemia, suspected sleep apnea, IBS, liver failure, PVD, neuropathy, pre-diabetes, COPD, arthritis.    PT Comments    Pt tolerates treatment well but remains dependent on physical assistance to safely complete all bed mobility. Transfer attempts are deferred at this time due to high falls risk and LE weakness. Pt participates in LE exercise well at this time. PT encourages performance of HEP throughout the day to improve ROM and strength at this time. PT continues to recommends SNF placement due to high falls risk.  Recommendations for follow up therapy are one component of a multi-disciplinary discharge planning process, led by the attending physician.  Recommendations may be updated based on patient status, additional functional criteria and insurance authorization.  Follow Up Recommendations  SNF;Supervision/Assistance - 24 hour     Equipment Recommendations  Wheelchair (measurements PT);Wheelchair cushion (measurements PT);Hospital bed (hoyer lift)    Recommendations for Other Services       Precautions / Restrictions Precautions Precautions: Fall Restrictions Weight Bearing Restrictions: No     Mobility  Bed Mobility Overal bed mobility: Needs Assistance Bed Mobility: Rolling;Supine to Sit;Sit to Supine Rolling: Mod assist   Supine to sit: Max assist;HOB elevated Sit to supine: Max assist;HOB elevated   General bed mobility comments: use of bed rails    Transfers Overall transfer level:  (deferred 2/2 high falls risk)                   Ambulation/Gait                 Stairs             Wheelchair Mobility    Modified Rankin (Stroke Patients Only)       Balance Overall balance assessment: Needs assistance Sitting-balance support: No upper extremity supported;Feet supported Sitting balance-Leahy Scale: Fair Sitting balance - Comments: moreso reliant on UE support of bed rails due to posterior lean created by air mattress Postural control: Posterior lean                                  Cognition Arousal/Alertness: Awake/alert Behavior During Therapy: WFL for tasks assessed/performed;Flat affect Overall Cognitive Status: Within Functional Limits for tasks assessed                                        Exercises General Exercises - Lower Extremity Ankle Circles/Pumps: AROM;Both;10 reps Gluteal Sets: AROM;Both;10 reps Long Arc Quad: AROM;Both;15 reps Hip Flexion/Marching: AROM;Both;10 reps    General Comments General comments (skin integrity, edema, etc.): VSS on 3L [place]      Pertinent Vitals/Pain Pain Assessment: No/denies pain    Home Living                      Prior Function            PT Goals (current goals can now be found in the  care plan section) Acute Rehab PT Goals Patient Stated Goal: to improve strength Progress towards PT goals: Progressing toward goals    Frequency    Min 2X/week      PT Plan Current plan remains appropriate    Co-evaluation              AM-PAC PT "6 Clicks" Mobility   Outcome Measure  Help needed turning from your back to your side while in a flat bed without using bedrails?: A Lot Help needed moving from lying on your back to sitting on the side of a flat bed without using bedrails?: A Lot Help needed moving to and from a bed to a chair (including a wheelchair)?: Total Help needed standing up from a chair using your arms (e.g., wheelchair or bedside chair)?: Total Help  needed to walk in hospital room?: Total Help needed climbing 3-5 steps with a railing? : Total 6 Click Score: 8    End of Session Equipment Utilized During Treatment: Oxygen Activity Tolerance: Patient tolerated treatment well Patient left: in bed;with call bell/phone within reach;with family/visitor present Nurse Communication: Mobility status PT Visit Diagnosis: Other abnormalities of gait and mobility (R26.89);Muscle weakness (generalized) (M62.81)     Time: [id number] PT Time Calculation (min) (ACUTE ONLY): 40 min  Charges:  $Therapeutic Exercise: 8-22 mins $Therapeutic Activity: 23-37 mins                     Zenaida Niece, PT, DPT Acute Rehabilitation Pager: [phone]    Zenaida Niece 11/26/2020, 2:55 PM

## 2020-11-26 NOTE — NC FL2 (Signed)
[place] LEVEL OF CARE SCREENING TOOL     IDENTIFICATION  Patient Name: Jean Stewart Birthdate: [birth year] Sex: female Admission Date (Current Location): 11/16/2020  [facility] and [place] Number:  Herbalist and Address:  [facility] [place] [address] [place], [place] [address]      Provider Number: [id number]  Attending Physician Name and Address:  Antonieta Pert, MD  Relative Name and Phone Number:  Nylene Nan - daughter; [phone]    Current Level of Care: Hospital Recommended Level of Care: [place] (Patient from [place]. Will return for ST Rehab, then back to LTC) Prior Approval Number:    Date Approved/Denied:   PASRR Number: [id number]  Discharge Plan: SNF (ST rehab if insurance approves)    Current Diagnoses: Patient Active Problem List   Diagnosis Date Noted   AKI (acute kidney injury) ([place])    Hyperkalemia    Acute encephalopathy    Hypoxia 11/17/2020   Acute on chronic respiratory failure with hypoxia and hypercapnia ([place]) 11/17/2020   Acute on chronic respiratory failure with hypercapnia ([place])    Type III open trimalleolar fracture of left ankle 01/22/2019   Open fracture dislocation of ankle 12/04/2018   Type III open trimalleolar fracture of ankle 12/04/2018   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[place]) 12/04/2018   Chronic anticoagulation 12/04/2018   Anemia of chronic disease 12/04/2018   Pressure injury of skin 05/12/2018   Atrial fibrillation with RVR ([place]) 05/11/2018   Obesity, Class III, BMI 40-49.9 (morbid obesity) ([place]) 05/11/2018   Essential hypertension 05/11/2018   Renal insufficiency 05/11/2018   Permanent atrial fibrillation ([place]) 05/11/2018   Acute respiratory failure with hypoxia and hypercapnia ([place]) 05/11/2018   Elevated troponin 05/11/2018    Orientation RESPIRATION BLADDER Height & Weight     Self  O2, Other (Comment) (3L to 4L oxygen at times  during hospitalization. Patient on Bipap (Adult small face mask) at night. Settings: IPAP 16 cm H2O, EPAP 8cm H2O) Continent Weight: (!) 370 lb 6.3 oz (168 kg) Height:  '5\' 5"'$  (165.1 cm)  BEHAVIORAL SYMPTOMS/MOOD NEUROLOGICAL BOWEL NUTRITION STATUS      Continent Diet (Heart-healthy diet)  AMBULATORY STATUS COMMUNICATION OF NEEDS Skin   Total Care (Did not ambulate with PT) Verbally Other (Comment) (Ecchymosis right, left anterior arm and leg)                       Personal Care Assistance Level of Assistance  Bathing, Feeding, Dressing Bathing Assistance: Maximum assistance Feeding assistance: Maximum assistance Dressing Assistance: Maximum assistance     Functional Limitations Info  Sight, Hearing, Speech Sight Info: Impaired Hearing Info: Adequate Speech Info: Adequate    SPECIAL CARE FACTORS FREQUENCY  PT (By licensed PT), OT (By licensed OT), Speech therapy     PT Frequency: PT eval 9/16. PT at SNF eval and treat, a minimum of 5 days per week OT Frequency: OT eval 9/16. OT at SNF eval and treat, a miniimum of 5 days per week     Speech Therapy Frequency: Speech eval 9/17. Patient has dysphagia and diet recommendation regular with thin liquid      Contractures Contractures Info: Not present    Additional Factors Info  Code Status, Allergies Code Status Info: Full Allergies Info: No known allergies           Current Medications (11/26/2020):  This is the current hospital active medication list Current Facility-Administered Medications  Medication Dose Route Frequency Provider Last Rate Last Admin   0.9 %  sodium chloride infusion   Intravenous PRN Audria Nine, DO 10 mL/hr at 11/23/20 1103 250 mL at 11/23/20 1103   apixaban (ELIQUIS) tablet 5 mg  5 mg Oral BID Domenic Polite, MD   5 mg at 11/26/20 0957   Chlorhexidine Gluconate Cloth 2 % PADS 6 each  6 each Topical Daily Icard, Bradley L, DO   6 each at 11/26/20 0515   docusate (COLACE) 50 MG/5ML liquid  100 mg  100 mg Per Tube BID PRN Corey Harold, NP       docusate sodium (COLACE) capsule 100 mg  100 mg Oral BID Einar Grad, RPH   100 mg at 11/26/20 0957   feeding supplement (ENSURE ENLIVE / ENSURE PLUS) liquid 237 mL  237 mL Oral BID BM Domenic Polite, MD   237 mL at 11/26/20 0956   furosemide (LASIX) tablet 40 mg  40 mg Oral Daily Domenic Polite, MD   40 mg at 11/26/20 0957   ipratropium-albuterol (DUONEB) 0.5-2.5 (3) MG/3ML nebulizer solution 3 mL  3 mL Nebulization Q6H PRN Icard, Leory Plowman L, DO       MEDLINE mouth rinse  15 mL Mouth Rinse BID Audria Nine, DO   15 mL at 11/26/20 1000   metoprolol succinate (TOPROL-XL) 24 hr tablet 50 mg  50 mg Oral Daily Domenic Polite, MD   50 mg at 11/26/20 0957   multivitamin with minerals tablet 1 tablet  1 tablet Oral Daily Domenic Polite, MD   1 tablet at 11/26/20 0957   polyethylene glycol (MIRALAX / GLYCOLAX) packet 17 g  17 g Per Tube Daily PRN Corey Harold, NP       polyethylene glycol (MIRALAX / GLYCOLAX) packet 17 g  17 g Oral Daily Einar Grad, RPH   17 g at 11/26/20 0956   sodium chloride flush (NS) 0.9 % injection 10-40 mL  10-40 mL Intracatheter Q12H Audria Nine, DO   10 mL at 11/26/20 1000   sodium chloride flush (NS) 0.9 % injection 10-40 mL  10-40 mL Intracatheter PRN Audria Nine, DO         Discharge Medications: Please see discharge summary for a list of discharge medications.  Relevant Imaging Results:  Relevant Lab Results:   Additional Information ss#[id number]  Sable Feil, LCSW

## 2020-11-26 NOTE — Progress Notes (Signed)
Patient seen and examined this morning.  Family at the bedside.  She is alert awake oriented x3 able to move all her extremities on parenteral nasal cannula home setting. Waiting for PT evaluation, was seen by Ortho yesterday Rest of the medical issues and plan as per discharge summary from yesterday and patient is medically stable for discharge once SNF is approved and bed available.

## 2020-11-26 NOTE — Progress Notes (Signed)
BIPap for SNF DC: Bipap IPAP: 16 and EPAP: 8 based on current settings

## 2020-11-26 NOTE — TOC Progression Note (Signed)
Transition of Care (TOC) - Progression Note  *Patient will d/c back to [facility] on 9/23    Patient Details  Name: Jean Stewart MRN: [id number] Date of Birth: [birth year]  Transition of Care [facility]) CM/SW Contact  [name], LCSW Phone Number: 11/26/2020, 4:55 PM  Clinical Narrative:  Patient is LTC at [facility], however facility wanted patient to return short-term rehab and then transition back to LTC. CSW attempted to start auth process in [facility], however patient did not pull up as active with [facility]. Called [employer] (3:43 pm) and talked with Myra regarding patient and requested info provided. Myra could pull patient up and indicated that [employer] no longer manages patient. She completed a ticket and requested that CSW contacted [facility] Medicare to verify coverage. [place] [phone]) and talked with Lelan Pons who advised CSW that patient has a PPO plan effective 03/07/20 and is managed by [employer]. She provided CSW with reference number [id number]. [employer] contacted again and talked with Brianna (4:01 pm) regarding patient, contacts made and info provided by [facility] Medicare, and provided her with the reference number that was provided by [facility] Medicare person. [place] informed CSW that they are not showing information and patient's information has to be sent to their eligibility department and they have to wait to hear back from this department.    CSW contacted Juliann Pulse (4:10 pm), admissions liaison with [facility] and update provided regarding contact with [employer] and [facility] Medicare. She requested that patient be sent back to them LTC.   Visited patient's room and talked with daughter [name] - 4:36 pm (patient was asleep) and update provided regarding trying to send patient back to SNF rehab and the problems CSW was encountering with contacts to [place] and [facility] Medicare. Daughter expressed understanding and had no concerns with her mother returning to  [facility] on Friday. Received a call from [place] with [employer] (4:37 pm) while in patient's room and stepped out to talk with her. Dorothyann Peng was provided with patient's information and she sent it out. Dorothyann Peng also checked patient's information in [employer] (name and date of birth) and reported that patient's plan is excluded from [place] and authorization would have to go through the health plan.   Secure chat to MD regarding insurance issues and [facility] requesting that patient return LTC on Friday and MD expressed no concerns.     Expected Discharge Plan: [place] [facility]) Barriers to Discharge: Continued Medical Work up  Expected Discharge Plan and Services Expected Discharge Plan: [place] [facility]) In-house Referral: Clinical Social Work   Post Acute Care Choice: [place] Living arrangements for the past 2 months: [place] Expected Discharge Date: 11/26/20                                   Social Determinants of Health (SDOH) Interventions  No SDOH interventions requested or needed at this time.  Readmission Risk Interventions No flowsheet data found.

## 2020-11-26 NOTE — Plan of Care (Signed)
  Problem: Clinical Measurements: Goal: Ability to maintain clinical measurements within normal limits will improve Outcome: Progressing   Problem: Activity: Goal: Risk for activity intolerance will decrease Outcome: Progressing   Problem: Coping: Goal: Level of anxiety will decrease Outcome: Progressing   Problem: Elimination: Goal: Will not experience complications related to bowel motility Outcome: Progressing   Problem: Pain Managment: Goal: General experience of comfort will improve Outcome: Progressing   Problem: Safety: Goal: Ability to remain free from injury will improve 11/26/2020 0201 by Jacqualine Code, RN Outcome: Progressing 11/26/2020 0145 by Jacqualine Code, RN Outcome: Progressing

## 2020-11-27 DIAGNOSIS — J9621 Acute and chronic respiratory failure with hypoxia: Secondary | ICD-10-CM | POA: Diagnosis not present

## 2020-11-27 DIAGNOSIS — J9622 Acute and chronic respiratory failure with hypercapnia: Secondary | ICD-10-CM | POA: Diagnosis not present

## 2020-11-27 NOTE — Progress Notes (Signed)
DISCHARGE NOTE SNF [place] [facility] to be discharged Skilled nursing facility per MD order. Patient verbalized understanding.  Skin clean, dry and intact without evidence of skin break down, no evidence of skin tears noted. IV catheter discontinued intact. Site without signs and symptoms of complications. Dressing and pressure applied. Pt denies pain at the site currently. No complaints noted.  Patient free of lines, drains, and wounds.   Discharge packet assembled. An After Visit Summary (AVS) was printed and given to the EMS personnel. Patient escorted via stretcher and discharged to [facility] via ambulance. Report called to accepting facility; all questions and concerns addressed.   Dolores Hoose, RN

## 2020-11-27 NOTE — TOC Transition Note (Signed)
Transition of Care [facility]) - CM/SW Discharge Note   Patient Details  Name: Jean Stewart MRN: [id number] Date of Birth: 21-Oct-1945  Transition of Care [facility]) CM/SW Contact:  Sable Feil, LCSW Phone Number: 11/27/2020, 5:31 PM   Clinical Narrative:  Patient discharged today back to [facility], long-term care. Discharge clinicals transmitted to facility and patient transported by non-emergency ambulance transport. Daughter at bedside.     Final next level of care: [facility]) Barriers to Discharge: Barriers Resolved   Patient Goals and CMS Choice Patient states their goals for this hospitalization and ongoing recovery are:: Patient agreeable to returning to [facility] where she is a LTC resident CMS Medicare.gov Compare Post Acute Care list provided to:: Other (Comment Required) (Not needed as patient from a [facility]) Choice offered to / list presented to : NA  Discharge Placement              Patient chooses bed at: [facility] Patient to be transferred to facility by: Non-emergency ambulance transport Name of family member notified: Daughter [name] who was at bedside Patient and family notified of of transfer: 11/27/20  Discharge Plan and Services In-house Referral: Clinical Social Work   Post Acute Care Choice: [place]                               Social Determinants of Health (SDOH) Interventions  No SDOH interventions requested or needed at discharge   Readmission Risk Interventions No flowsheet data found.

## 2020-11-27 NOTE — Plan of Care (Signed)
  Problem: Education: Goal: Knowledge of General Education information will improve Description: Including pain rating scale, medication(s)/side effects and non-pharmacologic comfort measures Outcome: Adequate for Discharge   Problem: Health Behavior/Discharge Planning: Goal: Ability to manage health-related needs will improve Outcome: Adequate for Discharge   Problem: Clinical Measurements: Goal: Ability to maintain clinical measurements within normal limits will improve Outcome: Adequate for Discharge Goal: Respiratory complications will improve 11/27/2020 1344 by Dolores Hoose, RN Outcome: Adequate for Discharge 11/27/2020 0825 by Dolores Hoose, RN Outcome: Progressing Goal: Cardiovascular complication will be avoided Outcome: Adequate for Discharge   Problem: Activity: Goal: Risk for activity intolerance will decrease 11/27/2020 1344 by Dolores Hoose, RN Outcome: Adequate for Discharge 11/27/2020 0825 by Dolores Hoose, RN Outcome: Progressing   Problem: Nutrition: Goal: Adequate nutrition will be maintained 11/27/2020 1344 by Dolores Hoose, RN Outcome: Adequate for Discharge 11/27/2020 0825 by Dolores Hoose, RN Outcome: Progressing   Problem: Coping: Goal: Level of anxiety will decrease Outcome: Adequate for Discharge   Problem: Elimination: Goal: Will not experience complications related to bowel motility Outcome: Adequate for Discharge Goal: Will not experience complications related to urinary retention Outcome: Adequate for Discharge   Problem: Pain Managment: Goal: General experience of comfort will improve Outcome: Adequate for Discharge   Problem: Safety: Goal: Ability to remain free from injury will improve Outcome: Adequate for Discharge   Problem: Skin Integrity: Goal: Risk for impaired skin integrity will decrease 11/27/2020 1344 by Dolores Hoose, RN Outcome: Adequate for Discharge 11/27/2020 0825 by Dolores Hoose,  RN Outcome: Progressing   Problem: Inadequate Intake (NI-2.1) Goal: Food and/or nutrient delivery Description: Individualized approach for food/nutrient provision. Outcome: Adequate for Discharge   Problem: Acute Rehab OT Goals (only OT should resolve) Goal: Pt. Will Perform Grooming Outcome: Adequate for Discharge Goal: Pt. Will Perform Upper Body Bathing Outcome: Adequate for Discharge Goal: Pt. Will Perform Upper Body Dressing Outcome: Adequate for Discharge Goal: Pt/Caregiver Will Perform Home Exercise Program Outcome: Adequate for Discharge Goal: OT Additional ADL Goal #1 Outcome: Adequate for Discharge Goal: OT Additional ADL Goal #2 Outcome: Adequate for Discharge   Problem: Acute Rehab PT Goals(only PT should resolve) Goal: Pt will Roll Supine to Side Outcome: Adequate for Discharge Goal: Pt Will Go Supine/Side To Sit Outcome: Adequate for Discharge Goal: Patient Will Perform Sitting Balance Outcome: Adequate for Discharge Goal: Patient Will Transfer Sit To/From Stand Outcome: Adequate for Discharge Goal: Pt Will Transfer Bed To Chair/Chair To Bed Outcome: Adequate for Discharge Goal: Pt/caregiver will Perform Home Exercise Program Outcome: Adequate for Discharge

## 2020-11-27 NOTE — Plan of Care (Signed)
?  Problem: Education: ?Goal: Knowledge of General Education information will improve ?Description: Including pain rating scale, medication(s)/side effects and non-pharmacologic comfort measures ?Outcome: Progressing ?  ?Problem: Health Behavior/Discharge Planning: ?Goal: Ability to manage health-related needs will improve ?Outcome: Progressing ?  ?Problem: Coping: ?Goal: Level of anxiety will decrease ?Outcome: Progressing ?  ?

## 2020-11-27 NOTE — Plan of Care (Signed)
  Problem: Clinical Measurements: Goal: Respiratory complications will improve Outcome: Progressing   Problem: Activity: Goal: Risk for activity intolerance will decrease Outcome: Progressing   Problem: Nutrition: Goal: Adequate nutrition will be maintained Outcome: Progressing   Problem: Skin Integrity: Goal: Risk for impaired skin integrity will decrease Outcome: Progressing   

## 2020-11-27 NOTE — Progress Notes (Signed)
PROGRESS NOTE    [name]  WR:[id number] DOB: 1945/11/21 DOA: 11/16/2020 PCP: Patient, No Pcp Per (Inactive)  Brief Narrative/Hospital Course: 75/F chronically ill from [facility], morbidly obese with BMI of 62, debilitated, COPD, chronic systolic and diastolic CHF, chronic respiratory failure on 3 L O2, OSA/OHS, hypertension, paroxysmal A. fib on Eliquis was brought to the ED with mental status changes, initially code stroke was suspected, on further work-up noted to have severe hypercarbia with CO2 narcosis. At this time patient has been stabilized waiting for placement.  Subjective: Seen and examined this morning.  Was on BiPAP overnight.  Eating her breakfast.  Feels stable for discharge to refer to health for rehab today.  Family at the bedside. Assessment & Plan: Acute on chronic hypercarbic respiratory failure History of COPD Acute diastolic CHF, pulmonary edema Morbid obesity, OSA/OHS -admitted to the ICU with severe hypercarbic respiratory failure, CO2 99, with respiratory acidosis, intubated and placed on mechanical ventilatory support -Extubated to BiPAP on 9/16 -Completed 5 days of IV ceftriaxone in the ICU -Diuresed with IV Lasix x2days, she is -2.4 L, clinically appears somewhat euvolemic but volume status very difficult to assess with morbid obesity  -Changed to p.o. Lasix 40 mg daily -Continue BiPAP nightly, emphasized compliance  -PT OT eval completed, SNF recommended  Was waiting for placement and still for discharge She will continue on BiPAP [id number]   Acute metabolic encephalopathy -Secondary to CO2 narcosis, ABG with severe hypercarbia on admission -EEG noted metabolic encephalopathy, CT head without acute findings Alert awake oriented at baseline.  Hypotension -Started on midodrine in the ICU, suspect this could have been related to sedation --Cultures are negative, -Completed 5 days of IV ceftriaxone Off midodrine.  Blood pressure stable  AKI  on CKD 3a -Baseline creatinine around 1.6-2, creatinine peaked to 2.4 this admission.  Improved to 1.2. Recent Labs  Lab 11/22/20 0352 11/23/20 0824 11/24/20 0703 11/25/20 0311 11/26/20 0633  BUN 27* 26* 24* 20 17  CREATININE 1.59* 1.61* 1.45* 1.40* 1.25*     PAF with RVR -Heart rate relatively controlled, continue metoprolol, and Eliquis   Obesity Class III Patient's Body mass index is 61.64 kg/m. : Will benefit with PCP follow-up, weight loss healthy lifestyle and cont bipap qhs  Patient is medically stable for discharge- Discharge date 11/27/2020  FEN: Diet Order             Diet Heart Room service appropriate? Yes; Fluid consistency: Thin  Diet effective now                   Nutrition Problem: Inadequate oral intake Etiology: inability to eat Signs/Symptoms: NPO status Interventions: Ensure Enlive (each supplement provides 350kcal and 20 grams of protein), MVI  DVT prophylaxis: SCDs Start: 11/17/20 0240 Code Status:   Code Status: Full Code  Family Communication: plan of care discussed with patient at bedside. Status is: Inpatient  Dispo: The patient is from: SNF              Anticipated d/c is to: SNF              Patient currently is medically stable to d/c.   Difficult to place patient No       Objective: Vitals: Today's Vitals   11/26/20 2106 11/26/20 2151 11/27/20 0548 11/27/20 0730  BP: (!) 106/56  120/87   Pulse: 85  79   Resp: 16  16   Temp: 98.5 F (36.9 C)  98.4 F (36.9 C)  TempSrc:      SpO2: 98%  97%   Weight:      Height:      PainSc:  0-No pain  0-No pain   Examination: General exam: Aa0x3, morbidly obese, not in distress,older than stated age HEENT:Oral mucosa moist, Ear/Nose WNL grossly,dentition normal. Respiratory system: bilaterally diminished, no use of accessory muscle, non tender. Cardiovascular system: S1 & S2 +,No JVD. Gastrointestinal system: Abdomen soft, NT,ND, BS+. Nervous System:Alert, awake, moving  extremities Extremities: mild edema, distal peripheral pulses palpable.  Skin: No rashes,no icterus. MSK: Normal muscle bulk,tone, power   Intake/Output Summary (Last 24 hours) at 11/27/2020 0855 Last data filed at 11/27/2020 0548 Gross per 24 hour  Intake 260 ml  Output 100 ml  Net 160 ml   Filed Weights   11/22/20 0500 11/24/20 2126 11/25/20 2125  Weight: (!) 168 kg (!) 168 kg (!) 168 kg   Weight change:    Consultants:see note  Procedures:see note Antimicrobials: Anti-infectives (From admission, onward)    Start     Dose/Rate Route Frequency Ordered Stop   11/20/20 0915  cefTRIAXone (ROCEPHIN) 1 g in sodium chloride 0.9 % 100 mL IVPB        1 g 200 mL/hr over 30 Minutes Intravenous Every 24 hours 11/20/20 0819 11/24/20 1047      Culture/Microbiology    Component Value Date/Time   SDES TRACHEAL ASPIRATE 11/20/2020 0848   SPECREQUEST NONE 11/20/2020 0848   CULT  11/20/2020 0848    RARE Normal respiratory flora-no Staph aureus or Pseudomonas seen Performed at [facility], [place] [address] [place], [place] [address]    REPTSTATUS 11/22/2020 FINAL 11/20/2020 0848    Other culture-see note  Unresulted Labs (From admission, onward)    None     Medications reviewed:  Scheduled Meds:  apixaban  5 mg Oral BID   Chlorhexidine Gluconate Cloth  6 each Topical Daily   docusate sodium  100 mg Oral BID   feeding supplement  237 mL Oral BID BM   furosemide  40 mg Oral Daily   mouth rinse  15 mL Mouth Rinse BID   metoprolol succinate  50 mg Oral Daily   multivitamin with minerals  1 tablet Oral Daily   polyethylene glycol  17 g Oral Daily   sodium chloride flush  10-40 mL Intracatheter Q12H   Continuous Infusions:  sodium chloride 250 mL (11/23/20 1103)     Intake/Output from previous day: 09/22 0701 - 09/23 0700 In: 460 [P.O.:460] Out: 100 [Urine:100] Intake/Output this shift: No intake/output data recorded. Filed Weights   11/22/20 0500 11/24/20 2126  11/25/20 2125  Weight: (!) 168 kg (!) 168 kg (!) 168 kg   Data Reviewed: I have personally reviewed following labs and imaging studies CBC: Recent Labs  Lab 11/21/20 0407 11/22/20 0352 11/23/20 0040 11/24/20 0703 11/25/20 0311  WBC 6.0 5.8 5.5 3.7* 4.2  HGB 10.5* 10.4* 10.1* 9.7* 9.9*  HCT 33.5* 34.3* 32.4* 32.0* 31.7*  MCV 104.0* 104.9* 105.2* 104.6* 101.6*  PLT 148* 168 166 159 [id number]   Basic Metabolic Panel: Recent Labs  Lab 11/22/20 0352 11/23/20 0824 11/24/20 0703 11/25/20 0311 11/26/20 0633  NA 139 137 138 137 138  K 4.6 4.4 4.4 4.1 3.8  CL 96* 95* 97* 96* 96*  CO2 32 33* 33* 31 36*  GLUCOSE 87 89 94 94 105*  BUN 27* 26* 24* 20 17  CREATININE 1.59* 1.61* 1.45* 1.40* 1.25*  CALCIUM 8.9 9.0 9.1 9.1  9.0   GFR: Estimated Creatinine Clearance: 60.3 mL/min (A) (by C-G formula based on SCr of 1.25 mg/dL (H)). Liver Function Tests: Recent Labs  Lab 11/23/20 0824  AST 18  ALT 21  ALKPHOS 83  BILITOT 0.7  PROT 6.7  ALBUMIN 3.1*   No results for input(s): LIPASE, AMYLASE in the last 168 hours. Recent Labs  Lab 11/23/20 0824  AMMONIA 35   Coagulation Profile: No results for input(s): INR, PROTIME in the last 168 hours. Cardiac Enzymes: No results for input(s): CKTOTAL, CKMB, CKMBINDEX, TROPONINI in the last 168 hours. BNP (last 3 results) No results for input(s): PROBNP in the last 8760 hours. HbA1C: No results for input(s): HGBA1C in the last 72 hours. CBG: Recent Labs  Lab 11/22/20 1940 11/23/20 0005 11/23/20 0444 11/23/20 0836 11/23/20 1147  GLUCAP 91 89 97 83 96   Lipid Profile: No results for input(s): CHOL, HDL, LDLCALC, TRIG, CHOLHDL, LDLDIRECT in the last 72 hours. Thyroid Function Tests: No results for input(s): TSH, T4TOTAL, FREET4, T3FREE, THYROIDAB in the last 72 hours. Anemia Panel: No results for input(s): VITAMINB12, FOLATE, FERRITIN, TIBC, IRON, RETICCTPCT in the last 72 hours. Sepsis Labs: No results for input(s): PROCALCITON,  LATICACIDVEN in the last 168 hours.  Recent Results (from the past 240 hour(s))  Culture, Respiratory w Gram Stain     Status: None   Collection Time: 11/18/20 12:10 PM   Specimen: Tracheal Aspirate; Respiratory  Result Value Ref Range Status   Specimen Description TRACHEAL ASPIRATE  Final   Special Requests NONE  Final   Gram Stain   Final    MODERATE WBC PRESENT,BOTH PMN AND MONONUCLEAR FEW GRAM POSITIVE COCCI RARE GRAM VARIABLE ROD    Culture   Final    FEW Normal respiratory flora-no Staph aureus or Pseudomonas seen Performed at [facility], [address], [place], [place] [address]    Report Status 11/20/2020 FINAL  Final  Culture, Respiratory w Gram Stain     Status: None   Collection Time: 11/20/20  8:48 AM   Specimen: Tracheal Aspirate; Respiratory  Result Value Ref Range Status   Specimen Description TRACHEAL ASPIRATE  Final   Special Requests NONE  Final   Gram Stain   Final    MODERATE WBC PRESENT,BOTH PMN AND MONONUCLEAR RARE GRAM POSITIVE COCCI IN PAIRS    Culture   Final    RARE Normal respiratory flora-no Staph aureus or Pseudomonas seen Performed at [facility], [address], [place], [place] [address]    Report Status 11/22/2020 FINAL  Final  SARS CORONAVIRUS 2 (TAT 6-24 HRS) Nasopharyngeal Nasopharyngeal Swab     Status: None   Collection Time: 11/25/20 11:08 AM   Specimen: Nasopharyngeal Swab  Result Value Ref Range Status   SARS Coronavirus 2 NEGATIVE NEGATIVE Final    Comment: (NOTE) SARS-CoV-2 target nucleic acids are NOT DETECTED.  The SARS-CoV-2 RNA is generally detectable in upper and lower respiratory specimens during the acute phase of infection. Negative results do not preclude SARS-CoV-2 infection, do not rule out co-infections with other pathogens, and should not be used as the sole basis for treatment or other patient management decisions. Negative results must be combined with clinical observations, patient history, and  epidemiological information. The expected result is Negative.  Fact Sheet for Patients: SugarRoll.be  Fact Sheet for Healthcare Providers: https://www.woods-mathews.com/  This test is not yet approved or cleared by the [place] FDA and  has been authorized for detection and/or  diagnosis of SARS-CoV-2 by FDA under an Emergency Use Authorization (EUA). This EUA will remain  in effect (meaning this test can be used) for the duration of the COVID-19 declaration under Se ction 564(b)(1) of the Act, 21 U.S.C. section 360bbb-3(b)(1), unless the authorization is terminated or revoked sooner.  Performed at [facility], [place] [address], [place], [place] [address]      Radiology Studies: No results found.   LOS: 10 days   Antonieta Pert, MD [facility]  11/27/2020, 8:55 AM

## 2020-12-16 ENCOUNTER — Encounter (HOSPITAL_COMMUNITY): Payer: Self-pay | Admitting: Radiology

## 2021-01-12 ENCOUNTER — Encounter: Payer: Self-pay | Admitting: Pulmonary Disease

## 2021-01-12 ENCOUNTER — Other Ambulatory Visit: Payer: Self-pay

## 2021-01-12 ENCOUNTER — Ambulatory Visit (INDEPENDENT_AMBULATORY_CARE_PROVIDER_SITE_OTHER): Payer: Medicare Other | Admitting: Pulmonary Disease

## 2021-01-12 VITALS — BP 126/74 | HR 72

## 2021-01-12 DIAGNOSIS — G4733 Obstructive sleep apnea (adult) (pediatric): Secondary | ICD-10-CM

## 2021-01-12 DIAGNOSIS — J9611 Chronic respiratory failure with hypoxia: Secondary | ICD-10-CM

## 2021-01-12 DIAGNOSIS — E662 Morbid (severe) obesity with alveolar hypoventilation: Secondary | ICD-10-CM

## 2021-01-12 DIAGNOSIS — J9612 Chronic respiratory failure with hypercapnia: Secondary | ICD-10-CM

## 2021-01-12 NOTE — Progress Notes (Signed)
Synopsis: Referred in November 2022 for respiratory failure by Garwin Brothers, MD  Subjective:   PATIENT ID: Unk Jean Stewart: female DOB: Jul 22, 1945, MRN: [id number]   HPI  Chief Complaint  Patient presents with   Consult    HFU for resp failure. Uses 2.5L Has a cpap machine but does not use it every night. Uses [employer] as her DME.    Jean Stewart is a 75 year old woman, never smoker with history of obesity, hypertension, atrial fibrillation, COPD, and OSA/OHS who is referred to pulmonary clinic for hospital follow up.   Patient was admitted 9/13 to 9/23 for hypercapnic respiratory failure and altered mental status. She required intubation and mechanical ventilation, then extubated to bipap. She was discharged to a rehab facility.  She does have a BiPAP machine at the rehab facility which she uses maybe 1 night per week.  It sounds like she has difficulty with the mask that she currently has.  She does have seasonal allergies that are worse in the spring and fall seasons.  She denies any significant sinus congestion and postnasal drainage.  She does have mild intermittent wheezing due to allergens.  She is a never smoker.  Past Medical History:  Diagnosis Date   A-fib ([place])    Arthritis    COPD (chronic obstructive pulmonary disease) (HCC)    Hypertension    IBS (irritable bowel syndrome)    Liver failure (HCC)    Morbid obesity (HCC)    Neuropathy    Peripheral vascular disease (HCC)    blood clots in legs   Physical deconditioning    Pre-diabetes    Suspected sleep apnea      Family History  Problem Relation Age of Onset   CAD Mother        MI at age 33     Social History   Socioeconomic History   Marital status: Single    Spouse name: Not on file   Number of children: Not on file   Years of education: Not on file   Highest education level: Not on file  Occupational History   Not on file  Tobacco Use   Smoking status: Never   Smokeless tobacco:  Never  Substance and Sexual Activity   Alcohol use: Not Currently   Drug use: Never   Sexual activity: Not Currently  Other Topics Concern   Not on file  Social History Narrative   Not on file   Social Determinants of Health   Financial Resource Strain: Not on file  Food Insecurity: Not on file  Transportation Needs: Not on file  Physical Activity: Not on file  Stress: Not on file  Social Connections: Not on file  Intimate Partner Violence: Not on file     No Known Allergies   Outpatient Medications Prior to Visit  Medication Sig Dispense Refill   acetaminophen (TYLENOL) 325 MG tablet Take 650 mg by mouth every 4 (four) hours as needed for fever.     apixaban (ELIQUIS) 5 MG TABS tablet Take 5 mg by mouth 2 (two) times daily.     furosemide (LASIX) 40 MG tablet Take 40 mg by mouth daily.     Menthol, Topical Analgesic, (BIOFREEZE) 4 % GEL Apply 1 application topically 2 (two) times daily as needed (pain).     metoprolol succinate (TOPROL-XL) 100 MG 24 hr tablet Take 0.5 tablets (50 mg total) by mouth daily. Take with or immediately following a meal. 30 tablet 0   nystatin (  MYCOSTATIN/NYSTOP) powder Apply 1 application topically in the morning and at bedtime. Apply to right axilla/under breast     polyethylene glycol (MIRALAX / GLYCOLAX) 17 g packet Take 17 g by mouth daily. 14 each 0   saccharomyces boulardii (FLORASTOR) 250 MG capsule Take 250 mg by mouth 2 (two) times daily.     ZINC OXIDE EX Apply 1 application topically in the morning and at bedtime. To right buttock     No facility-administered medications prior to visit.    Review of Systems  Constitutional:  Negative for chills, fever, malaise/fatigue and weight loss.  HENT:  Negative for congestion, sinus pain and sore throat.   Eyes: Negative.   Respiratory:  Positive for shortness of breath. Negative for cough, hemoptysis, sputum production and wheezing.   Cardiovascular:  Positive for leg swelling. Negative for  chest pain, palpitations, orthopnea and claudication.  Gastrointestinal:  Negative for abdominal pain, heartburn, nausea and vomiting.  Genitourinary: Negative.   Musculoskeletal:  Positive for joint pain. Negative for myalgias.  Skin:  Negative for rash.  Neurological:  Negative for weakness.  Endo/Heme/Allergies: Negative.   Psychiatric/Behavioral: Negative.       Objective:   Vitals:   01/12/21 1143  BP: 126/74  Pulse: 72  SpO2: 98%     Physical Exam Constitutional:      General: She is not in acute distress.    Appearance: She is obese. She is not ill-appearing.  HENT:     Head: Normocephalic and atraumatic.  Eyes:     General: No scleral icterus.    Conjunctiva/sclera: Conjunctivae normal.     Pupils: Pupils are equal, round, and reactive to light.  Cardiovascular:     Rate and Rhythm: Normal rate and regular rhythm.     Pulses: Normal pulses.     Heart sounds: Normal heart sounds. No murmur heard. Pulmonary:     Effort: Pulmonary effort is normal.     Breath sounds: Decreased air movement present. Decreased breath sounds present. No wheezing, rhonchi or rales.  Abdominal:     General: Bowel sounds are normal.     Palpations: Abdomen is soft.  Musculoskeletal:     Right lower leg: No edema.     Left lower leg: No edema.  Lymphadenopathy:     Cervical: No cervical adenopathy.  Skin:    General: Skin is warm and dry.  Neurological:     General: No focal deficit present.     Mental Status: She is alert.  Psychiatric:        Mood and Affect: Mood normal.        Behavior: Behavior normal.        Thought Content: Thought content normal.        Judgment: Judgment normal.    CBC    Component Value Date/Time   WBC 4.2 11/25/2020 0311   RBC 3.12 (L) 11/25/2020 0311   HGB 9.9 (L) 11/25/2020 0311   HCT 31.7 (L) 11/25/2020 0311   PLT 165 11/25/2020 0311   MCV 101.6 (H) 11/25/2020 0311   MCH 31.7 11/25/2020 0311   MCHC 31.2 11/25/2020 0311   RDW 13.2  11/25/2020 0311   LYMPHSABS 1.2 11/16/2020 2114   MONOABS 0.5 11/16/2020 2114   EOSABS 0.0 11/16/2020 2114   BASOSABS 0.0 11/16/2020 2114   Chest imaging: CXR 11/21/20 1. Cardiomegaly with vascular congestion and interstitial edema. 2. Bibasilar atelectasis or infiltrate.  PFT: No flowsheet data found.  Labs:  Path:  Echo 11/17/20:  LVEF 55-60%.  Diastolic function is indeterminate.  RV systolic function is normal.  RV size is normal.  Moderately elevated pulmonary artery systolic pressure 53.6 mmHg.  Left atrium mildly dilated.  Right atrium mildly dilated.  Heart Catheterization:  Assessment & Plan:   Chronic respiratory failure with hypoxia and hypercapnia (HCC) - Plan: Bipap titration  Obesity hypoventilation syndrome ([place]) - Plan: Bipap titration  Obstructive sleep apnea - Plan: Bipap titration  Obesity, Class III, BMI 40-49.9 (morbid obesity) ([place]) - Plan: Bipap titration  Discussion: Kenlea Woodell is a 75 year old woman, never smoker with history of obesity, hypertension, atrial fibrillation, COPD, and OSA/OHS who is referred to pulmonary clinic for hospital follow up.   She has morbid obesity that is contributing to obesity hypoventilation syndrome and obstructive sleep apnea.  She is to continue supplemental oxygen for her hypoventilation syndrome.  She is to continue BiPAP therapy at night.  We will schedule her for a BiPAP titration study along with mask fitting which will hopefully provide better compliance.  She is to follow-up in 4 months.  Freda Jackson, MD [facility] Office: [phone]   Current Outpatient Medications:    acetaminophen (TYLENOL) 325 MG tablet, Take 650 mg by mouth every 4 (four) hours as needed for fever., Disp: , Rfl:    apixaban (ELIQUIS) 5 MG TABS tablet, Take 5 mg by mouth 2 (two) times daily., Disp: , Rfl:    furosemide (LASIX) 40 MG tablet, Take 40 mg by mouth daily., Disp: , Rfl:    Menthol, Topical  Analgesic, (BIOFREEZE) 4 % GEL, Apply 1 application topically 2 (two) times daily as needed (pain)., Disp: , Rfl:    metoprolol succinate (TOPROL-XL) 100 MG 24 hr tablet, Take 0.5 tablets (50 mg total) by mouth daily. Take with or immediately following a meal., Disp: 30 tablet, Rfl: 0   nystatin (MYCOSTATIN/NYSTOP) powder, Apply 1 application topically in the morning and at bedtime. Apply to right axilla/under breast, Disp: , Rfl:    polyethylene glycol (MIRALAX / GLYCOLAX) 17 g packet, Take 17 g by mouth daily., Disp: 14 each, Rfl: 0   saccharomyces boulardii (FLORASTOR) 250 MG capsule, Take 250 mg by mouth 2 (two) times daily., Disp: , Rfl:    ZINC OXIDE EX, Apply 1 application topically in the morning and at bedtime. To right buttock, Disp: , Rfl:

## 2021-01-12 NOTE — Patient Instructions (Signed)
Recommend using CPAP/BiPAP at night when sleeping and during the day if taking a nap.  We will schedule you for CPAP/BiPAP titration study.  Recommend working on weight loss.

## 2021-02-17 ENCOUNTER — Encounter (HOSPITAL_BASED_OUTPATIENT_CLINIC_OR_DEPARTMENT_OTHER): Payer: Medicare Other | Admitting: Pulmonary Disease

## 2021-03-19 ENCOUNTER — Encounter (HOSPITAL_BASED_OUTPATIENT_CLINIC_OR_DEPARTMENT_OTHER): Payer: Medicare Other | Admitting: Pulmonary Disease

## 2021-03-22 ENCOUNTER — Telehealth: Payer: Self-pay | Admitting: Pulmonary Disease

## 2021-03-23 NOTE — Telephone Encounter (Signed)
Left VM w/ sleep center number for pt to call & reschedule.

## 2023-09-15 ENCOUNTER — Inpatient Hospital Stay: Attending: Oncology | Admitting: Oncology

## 2023-09-15 ENCOUNTER — Telehealth: Payer: Self-pay | Admitting: Nurse Practitioner

## 2023-09-15 ENCOUNTER — Encounter: Payer: Self-pay | Admitting: Oncology

## 2023-09-15 ENCOUNTER — Other Ambulatory Visit: Payer: Self-pay

## 2023-09-15 ENCOUNTER — Inpatient Hospital Stay

## 2023-09-15 VITALS — BP 118/84 | HR 96 | Temp 97.9°F | Resp 18

## 2023-09-15 DIAGNOSIS — R768 Other specified abnormal immunological findings in serum: Secondary | ICD-10-CM | POA: Diagnosis present

## 2023-09-15 DIAGNOSIS — D8989 Other specified disorders involving the immune mechanism, not elsewhere classified: Secondary | ICD-10-CM | POA: Insufficient documentation

## 2023-09-15 DIAGNOSIS — D472 Monoclonal gammopathy: Secondary | ICD-10-CM | POA: Insufficient documentation

## 2023-09-15 DIAGNOSIS — N183 Chronic kidney disease, stage 3 unspecified: Secondary | ICD-10-CM | POA: Insufficient documentation

## 2023-09-15 DIAGNOSIS — Z79899 Other long term (current) drug therapy: Secondary | ICD-10-CM | POA: Insufficient documentation

## 2023-09-15 DIAGNOSIS — D638 Anemia in other chronic diseases classified elsewhere: Secondary | ICD-10-CM | POA: Diagnosis not present

## 2023-09-15 DIAGNOSIS — G629 Polyneuropathy, unspecified: Secondary | ICD-10-CM | POA: Diagnosis not present

## 2023-09-15 DIAGNOSIS — N1832 Chronic kidney disease, stage 3b: Secondary | ICD-10-CM

## 2023-09-15 LAB — CMP (CANCER CENTER ONLY)
ALT: 11 U/L (ref 0–44)
AST: 15 U/L (ref 15–41)
Albumin: 3.8 g/dL (ref 3.5–5.0)
Alkaline Phosphatase: 116 U/L (ref 38–126)
Anion gap: 7 (ref 5–15)
BUN: 45 mg/dL — ABNORMAL HIGH (ref 8–23)
CO2: 36 mmol/L — ABNORMAL HIGH (ref 22–32)
Calcium: 9.4 mg/dL (ref 8.9–10.3)
Chloride: 100 mmol/L (ref 98–111)
Creatinine: 1.8 mg/dL — ABNORMAL HIGH (ref 0.44–1.00)
GFR, Estimated: 28 mL/min — ABNORMAL LOW (ref >60)
Glucose, Bld: 96 mg/dL (ref 70–99)
Potassium: 4.3 mmol/L (ref 3.5–5.1)
Sodium: 143 mmol/L (ref 135–145)
Total Bilirubin: 0.7 mg/dL (ref 0.0–1.2)
Total Protein: 7.6 g/dL (ref 6.5–8.1)

## 2023-09-15 LAB — CBC WITH DIFFERENTIAL (CANCER CENTER ONLY)
Abs Immature Granulocytes: 0.02 K/uL (ref 0.00–0.07)
Basophils Absolute: 0 K/uL (ref 0.0–0.1)
Basophils Relative: 0 %
Eosinophils Absolute: 0.2 K/uL (ref 0.0–0.5)
Eosinophils Relative: 3 %
HCT: 34.1 % — ABNORMAL LOW (ref 36.0–46.0)
Hemoglobin: 10.8 g/dL — ABNORMAL LOW (ref 12.0–15.0)
Immature Granulocytes: 0 %
Lymphocytes Relative: 15 %
Lymphs Abs: 1 K/uL (ref 0.7–4.0)
MCH: 31.8 pg (ref 26.0–34.0)
MCHC: 31.7 g/dL (ref 30.0–36.0)
MCV: 100.3 fL — ABNORMAL HIGH (ref 80.0–100.0)
Monocytes Absolute: 0.5 K/uL (ref 0.1–1.0)
Monocytes Relative: 7 %
Neutro Abs: 5.2 K/uL (ref 1.7–7.7)
Neutrophils Relative %: 75 %
Platelet Count: 130 K/uL — ABNORMAL LOW (ref 150–400)
RBC: 3.4 MIL/uL — ABNORMAL LOW (ref 3.87–5.11)
RDW: 13.3 % (ref 11.5–15.5)
Smear Review: NORMAL
WBC Count: 6.9 K/uL (ref 4.0–10.5)
nRBC: 0 % (ref 0.0–0.2)

## 2023-09-15 LAB — URIC ACID: Uric Acid, Serum: 9.7 mg/dL — ABNORMAL HIGH (ref 2.5–7.1)

## 2023-09-15 LAB — LACTATE DEHYDROGENASE: LDH: 221 U/L — ABNORMAL HIGH (ref 98–192)

## 2023-09-15 NOTE — Progress Notes (Signed)
 [facility]  HEMATOLOGY CLINIC CONSULTATION NOTE   PATIENT NAME: Jean Stewart   MR#: [id number] DOB: Oct 25, 1945  DATE OF SERVICE: 09/15/2023   REFERRING PROVIDER  Jayson Player, MD  Patient Care Team: Patient, No Pcp Per as PCP - General (General Practice) Maranda Leim DEL, MD as PCP - Cardiology (Cardiology) Player Jayson PARAS, MD as Consulting Physician (Internal Medicine)   REASON FOR CONSULTATION/ CHIEF COMPLAINT:  Elevated kappa light chain level  ASSESSMENT & PLAN:  [name] is a 78 y.o. lady with a past medical history of CKD stage III, hypertension, COPD, A-fib, morbid obesity, peripheral vascular disease, sleep apnea, IBS was referred to our service for evaluation of elevated kappa light chain.    Elevated serum kappa light chain On 08/02/2023, SPEP showed no M spike.  Free kappa was increased to 240 mg/L, free lambda was 24.6 mg/L, ratio normal at 9.78.  Creatinine was 1.67, calcium 9.1.  Previously labs in November 2024 showed no M spike.  Free kappa was 254.5 Mg per liter, increased at that time as well with lambda of 32.6, ratio 7.81.  Hemoglobin was 11.4, creatinine 1.89, calcium 9.3 at that time.  Given persistently elevated kappa light chain, referral was sent to us  for further evaluation.  Given elevated kappa light chain and abnormal kappa/lambda light chain ratio, additional testing would be warranted to rule out multiple myeloma.  Hence today we will repeat SPEP, IFE, quantitative immunoglobulins, serum free light chains.  We will also obtain 24-hour urine analysis for UPEP, IFE.  When she comes to retain her urine specimen, we will obtain x-ray skeletal survey to look for any bone lytic lesions.  SPEP previously did not show M spike.  However we have to make sure she does not have Bence-Jones proteinuria.  Creatinine 1.8 today, stable.  Hemoglobin 10.8, MCV 100.3.  White count normal.  Platelet 130,000.  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  Depending on above workup, she may end up needing bone marrow biopsy or kidney biopsy for further evaluation.  Patient was made aware of my absence in clinic later this month.  She will be seen by one of our other providers to follow-up on above workup and we will arrange for bone marrow biopsy or additional workup as may be needed.  CKD (chronic kidney disease) stage 3, GFR 30-59 ml/min (HCC) Chronic kidney disease with creatinine levels ranging from 1.8 to 2.4 since 2020, indicating reduced kidney function. The kidney function is approximately half of the normal level. She has been seeing a nephrologist for management.  Anemia of chronic disease She seems to have anemia of chronic disease.  Hemoglobin stable overall at 10.8.  We will consider pursuing additional workup on return visit, depending on results from pending labs.    I reviewed lab results and outside records for this visit and discussed relevant results with the patient. Diagnosis, plan of care and treatment options were also discussed in detail with the patient. Opportunity provided to ask questions and answers provided to her apparent satisfaction. Provided instructions to call our clinic with any problems, questions or concerns prior to return visit. I recommended to continue follow-up with PCP and sub-specialists. She verbalized understanding and agreed with the plan. No barriers to learning was detected.  Chinita Patten, MD  09/15/2023 6:26 PM  [facility] - A DEPT OF [facility] [address] [place] [address] Dept: [phone] Dept Fax: [phone]   HISTORY OF PRESENT ILLNESS:  Discussed the use of AI scribe software for clinical note transcription with the patient, who gave verbal consent to proceed.  History of Present Illness Jean Stewart is an 78 year old female with chronic kidney  disease who presents for evaluation of abnormal protein in the blood. She was referred by Dr. Macel for evaluation of an abnormal kappa light chain found in the blood.  On 08/02/2023, SPEP showed no M spike.  Free kappa was increased to 240 mg/L, free lambda was 24.6 mg/L, ratio normal at 9.78.  Creatinine was 1.67, calcium 9.1.  Previously labs in November 2024 showed no M spike.  Free kappa was 254.5 Mg per liter, increased at that time as well with lambda of 32.6, ratio 7.81.  Hemoglobin was 11.4, creatinine 1.89, calcium 9.3 at that time.  Given persistently elevated kappa light chain, referral was sent to us  for further evaluation.  She has a history of chronic kidney disease with creatinine levels ranging from 1.8 to 2.4, indicating reduced kidney function since at least 2020.  She recently established with nephrologist  She resides in a nursing home and experiences variable appetite and weight fluctuations. Her weight was 395 pounds last month and recently increased to 404 pounds, which she attributes in part to prednisone use.  No issues with urination and she is able to produce a good amount of urine. She has been asked to collect her urine over a 24-hour period for further testing. No significant changes in urination patterns or other acute symptoms related to kidney function.   MEDICAL HISTORY Past Medical History:  Diagnosis Date   A-fib [facility])    Acute encephalopathy    Acute on chronic respiratory failure with hypercapnia (HCC)    Acute on chronic respiratory failure with hypoxia and hypercapnia (HCC) 11/17/2020   Acute respiratory failure with hypoxia and hypercapnia (HCC) 05/11/2018   Arthritis    COPD (chronic obstructive pulmonary disease) (HCC)    Elevated troponin 05/11/2018   Hypertension    Hypoxia 11/17/2020   IBS (irritable bowel syndrome)    Liver failure (HCC)    Morbid obesity (HCC)    Neuropathy    Peripheral vascular disease (HCC)    blood clots in legs    Physical deconditioning    Pre-diabetes    Suspected sleep apnea      SURGICAL HISTORY Past Surgical History:  Procedure Laterality Date   EXTERNAL FIXATION LEG Left 12/03/2018   Procedure: EXTERNAL FIXATION LEG;  Surgeon: Kendal Franky SQUIBB, MD;  Location: [facility] OR;  Service: Orthopedics;  Laterality: Left;   EXTERNAL FIXATION REMOVAL Left 01/25/2019   Procedure: REMOVAL EXTERNAL FIXATION LEG;  Surgeon: Kendal Franky SQUIBB, MD;  Location: [facility] OR;  Service: Orthopedics;  Laterality: Left;   I & D EXTREMITY Left 12/03/2018   Procedure: IRRIGATION AND DEBRIDEMENT EXTREMITY;  Surgeon: Kendal Franky SQUIBB, MD;  Location: [facility] OR;  Service: Orthopedics;  Laterality: Left;   ORIF ANKLE FRACTURE Left 12/03/2018   Procedure: OPEN REDUCTION INTERNAL FIXATION (ORIF) ANKLE FRACTURE;  Surgeon: Kendal Franky SQUIBB, MD;  Location: [facility] OR;  Service: Orthopedics;  Laterality: Left;     SOCIAL HISTORY: She reports that she has never smoked. She has never used smokeless tobacco. She reports that she does not currently use alcohol. She reports that she does not use drugs. Social History   Socioeconomic History   Marital status: Single    Spouse  name: Not on file   Number of children: Not on file   Years of education: Not on file   Highest education level: Not on file  Occupational History   Not on file  Tobacco Use   Smoking status: Never   Smokeless tobacco: Never  Substance and Sexual Activity   Alcohol use: Not Currently   Drug use: Never   Sexual activity: Not Currently  Other Topics Concern   Not on file  Social History Narrative   Not on file   Social Drivers of Health   Financial Resource Strain: Not on file  Food Insecurity: No Food Insecurity (09/15/2023)   Hunger Vital Sign    Worried About Running Out of Food in the Last Year: Never true    Ran Out of Food in the Last Year: Never true  Transportation Needs: No Transportation Needs (09/15/2023)   PRAPARE - Administrator, Civil Service  (Medical): No    Lack of Transportation (Non-Medical): No  Physical Activity: Not on file  Stress: Not on file  Social Connections: Not on file  Intimate Partner Violence: Not At Risk (09/15/2023)   Humiliation, Afraid, Rape, and Kick questionnaire    Fear of Current or Ex-Partner: No    Emotionally Abused: No    Physically Abused: No    Sexually Abused: No    FAMILY HISTORY: Her family history includes CAD in her mother.  CURRENT MEDICATIONS   Current Outpatient Medications  Medication Instructions   acetaminophen  (TYLENOL ) 650 mg, Every 4 hours PRN   apixaban  (ELIQUIS ) 5 mg, 2 times daily   D3 1,000 Units, Daily   furosemide  (LASIX ) 40 mg, Daily   gabapentin  (NEURONTIN ) 100 mg, Daily   ipratropium-albuterol  (DUONEB) 0.5-2.5 (3) MG/3ML SOLN 3 mLs, Every 4 hours PRN   latanoprost (XALATAN) 0.005 % ophthalmic solution 1 drop, Daily at bedtime   metoprolol  succinate (TOPROL -XL) 12.5 mg, Daily   midodrine  (PROAMATINE ) 2.5 mg, 3 times daily   Multiple Vitamins-Iron (MULTIVITAMINS WITH IRON) TABS tablet 1 tablet, Daily   senna (SENOKOT) 8.6 MG tablet 2 tablets, Every other day   torsemide (DEMADEX) 20 mg, 2 times daily     ALLERGIES  She has no known allergies.  REVIEW OF SYSTEMS:  Review of Systems  Cardiovascular:  Positive for leg swelling.  Musculoskeletal:  Positive for arthralgias.  Neurological:  Positive for numbness.     Rest of the pertinent review of systems is unremarkable except as mentioned above in HPI.  PHYSICAL EXAMINATION:    Onc Performance Status - 09/15/23 1034       ECOG Perf Status   ECOG Perf Status Capable of only limited selfcare, confined to bed or chair more than 50% of waking hours      KPS SCALE   KPS % SCORE Requires considerable assistance, and frequent medical care          Vitals:   09/15/23 1008  BP: 118/84  Pulse: 96  Resp: 18  Temp: 97.9 F (36.6 C)  SpO2: 94%   There were no vitals filed for this visit.  Physical  Exam Constitutional:      General: She is not in acute distress.    Appearance: Normal appearance.  HENT:     Head: Normocephalic and atraumatic.  Cardiovascular:     Rate and Rhythm: Normal rate.  Pulmonary:     Effort: Pulmonary effort is normal. No respiratory distress.     Comments: Wearing O2 via nasal cannula  Abdominal:     General: There is no distension.  Neurological:     General: No focal deficit present.     Mental Status: She is alert and oriented to person, place, and time.  Psychiatric:        Mood and Affect: Mood normal.        Behavior: Behavior normal.      LABORATORY DATA:   I have reviewed the data as listed.  Results for orders placed or performed in visit on 09/15/23  Uric acid  Result Value Ref Range   Uric Acid, Serum 9.7 (H) 2.5 - 7.1 mg/dL  Lactate dehydrogenase  Result Value Ref Range   LDH 221 (H) 98 - 192 U/L  CMP (Cancer Center only)  Result Value Ref Range   Sodium 143 135 - 145 mmol/L   Potassium 4.3 3.5 - 5.1 mmol/L   Chloride 100 98 - 111 mmol/L   CO2 36 (H) 22 - 32 mmol/L   Glucose, Bld 96 70 - 99 mg/dL   BUN 45 (H) 8 - 23 mg/dL   Creatinine 8.19 (H) 9.55 - 1.00 mg/dL   Calcium 9.4 8.9 - 89.6 mg/dL   Total Protein 7.6 6.5 - 8.1 g/dL   Albumin  3.8 3.5 - 5.0 g/dL   AST 15 15 - 41 U/L   ALT 11 0 - 44 U/L   Alkaline Phosphatase 116 38 - 126 U/L   Total Bilirubin 0.7 0.0 - 1.2 mg/dL   GFR, Estimated 28 (L) >60 mL/min   Anion gap 7 5 - 15  CBC with Differential (Cancer Center Only)  Result Value Ref Range   WBC Count 6.9 4.0 - 10.5 K/uL   RBC 3.40 (L) 3.87 - 5.11 MIL/uL   Hemoglobin 10.8 (L) 12.0 - 15.0 g/dL   HCT 65.8 (L) 63.9 - 53.9 %   MCV 100.3 (H) 80.0 - 100.0 fL   MCH 31.8 26.0 - 34.0 pg   MCHC 31.7 30.0 - 36.0 g/dL   RDW 86.6 88.4 - 84.4 %   Platelet Count 130 (L) 150 - 400 K/uL   nRBC 0.0 0.0 - 0.2 %   Neutrophils Relative % 75 %   Neutro Abs 5.2 1.7 - 7.7 K/uL   Lymphocytes Relative 15 %   Lymphs Abs 1.0 0.7 -  4.0 K/uL   Monocytes Relative 7 %   Monocytes Absolute 0.5 0.1 - 1.0 K/uL   Eosinophils Relative 3 %   Eosinophils Absolute 0.2 0.0 - 0.5 K/uL   Basophils Relative 0 %   Basophils Absolute 0.0 0.0 - 0.1 K/uL   WBC Morphology MORPHOLOGY UNREMARKABLE    RBC Morphology MORPHOLOGY UNREMARKABLE    Smear Review Normal platelet morphology    Immature Granulocytes 0 %   Abs Immature Granulocytes 0.02 0.00 - 0.07 K/uL     RADIOGRAPHIC STUDIES:  No pertinent imaging studies available to review.  Orders Placed This Encounter  Procedures   DG Bone Survey Met    Standing Status:   Future    Expected Date:   09/19/2023    Expiration Date:   09/14/2024    Reason for Exam (SYMPTOM  OR DIAGNOSIS REQUIRED):   Suspected multiple myeloma. Please look for bone lytic lesions.    Preferred imaging location?:   [facility]   CBC with Differential (Cancer Center Only)    Standing Status:   Future    Number of Occurrences:   1    Expiration Date:   09/14/2024  CMP (Cancer Center only)    Standing Status:   Future    Number of Occurrences:   1    Expiration Date:   09/14/2024   Kappa/lambda light chains    Standing Status:   Future    Number of Occurrences:   1    Expiration Date:   09/14/2024   Lactate dehydrogenase    Standing Status:   Future    Number of Occurrences:   1    Expiration Date:   09/14/2024   Multiple Myeloma Panel (SPEP&IFE w/QIG)    Standing Status:   Future    Number of Occurrences:   1    Expiration Date:   09/14/2024   Uric acid    Standing Status:   Future    Number of Occurrences:   1    Expiration Date:   09/14/2024   Beta 2 microglobulin, serum    Standing Status:   Future    Number of Occurrences:   1    Expiration Date:   09/14/2024   UPEP/UIFE/Light Chains/TP, 24-Hr Ur    Standing Status:   Future    Expiration Date:   09/14/2024    Future Appointments  Date Time Provider Department Center  10/05/2023 10:00 AM [facility] None   10/05/2023 10:30 AM Burton, Lacie K, NP [facility] None     I spent a total of 55 minutes during this encounter with the patient including review of chart and various tests results, discussions about plan of care and coordination of care plan.  This document was completed utilizing speech recognition software. Grammatical errors, random word insertions, pronoun errors, and incomplete sentences are an occasional consequence of this system due to software limitations, ambient noise, and hardware issues. Any formal questions or concerns about the content, text or information contained within the body of this dictation should be directly addressed to the provider for clarification.

## 2023-09-15 NOTE — Progress Notes (Signed)
 Pt completed her blood work but did not receive her 24 hr urine collection paraphernalia and instructions per the charge nurse of the facility that the pt reside at. This Clinical research associate did bring the pt to the lab and informed the charge nurse that this patient had this lab ordered and needed the equipment and instructions. This Clinical research associate called the facility where the patient reside and gave report to the nurse including for the 24 hr urine collection. The 24 hr urine lab order was never released per the charge nurse. It was verified that the facility could obtain the supplies from another lab as long as the patient's name, DOB, and date and time of urine collection initiation were written on the label. A copy of the 24 hr urine collection instructions was faxed to the facility. Patient should return the urine next week at her and the facilities convenience.

## 2023-09-15 NOTE — Assessment & Plan Note (Signed)
 She seems to have anemia of chronic disease.  Hemoglobin stable overall at 10.8.  We will consider pursuing additional workup on return visit, depending on results from pending labs.

## 2023-09-15 NOTE — Assessment & Plan Note (Signed)
 On 08/02/2023, SPEP showed no M spike.  Free kappa was increased to 240 mg/L, free lambda was 24.6 mg/L, ratio normal at 9.78.  Creatinine was 1.67, calcium 9.1.  Previously labs in November 2024 showed no M spike.  Free kappa was 254.5 Mg per liter, increased at that time as well with lambda of 32.6, ratio 7.81.  Hemoglobin was 11.4, creatinine 1.89, calcium 9.3 at that time.  Given persistently elevated kappa light chain, referral was sent to us  for further evaluation.  Given elevated kappa light chain and abnormal kappa/lambda light chain ratio, additional testing would be warranted to rule out multiple myeloma.  Hence today we will repeat SPEP, IFE, quantitative immunoglobulins, serum free light chains.  We will also obtain 24-hour urine analysis for UPEP, IFE.  When she comes to retain her urine specimen, we will obtain x-ray skeletal survey to look for any bone lytic lesions.  SPEP previously did not show M spike.  However we have to make sure she does not have Bence-Jones proteinuria.  Creatinine 1.8 today, stable.  Hemoglobin 10.8, MCV 100.3.  White count normal.  Platelet 130,000.  She seems to have at least monoclonal gammopathy of renal significance.  Depending on above workup, she may end up needing bone marrow biopsy or kidney biopsy for further evaluation.  Patient was made aware of my absence in clinic later this month.  She will be seen by one of our other providers to follow-up on above workup and we will arrange for bone marrow biopsy or additional workup as may be needed.

## 2023-09-15 NOTE — Assessment & Plan Note (Signed)
 Chronic kidney disease with creatinine levels ranging from 1.8 to 2.4 since 2020, indicating reduced kidney function. The kidney function is approximately half of the normal level. She has been seeing a nephrologist for management.

## 2023-09-15 NOTE — Telephone Encounter (Signed)
 Scheduled next appointments for the patient with the scheduler of [facility].

## 2023-09-17 LAB — BETA 2 MICROGLOBULIN, SERUM: Beta-2 Microglobulin: 5 mg/L — ABNORMAL HIGH (ref 0.6–2.4)

## 2023-09-18 LAB — KAPPA/LAMBDA LIGHT CHAINS
Kappa free light chain: 229.6 mg/L — ABNORMAL HIGH (ref 3.3–19.4)
Kappa, lambda light chain ratio: 8.73 — ABNORMAL HIGH (ref 0.26–1.65)
Lambda free light chains: 26.3 mg/L (ref 5.7–26.3)

## 2023-09-20 LAB — MULTIPLE MYELOMA PANEL, SERUM
Albumin SerPl Elph-Mcnc: 3.7 g/dL (ref 2.9–4.4)
Albumin/Glob SerPl: 1.1 (ref 0.7–1.7)
Alpha 1: 0.3 g/dL (ref 0.0–0.4)
Alpha2 Glob SerPl Elph-Mcnc: 0.7 g/dL (ref 0.4–1.0)
B-Globulin SerPl Elph-Mcnc: 1.1 g/dL (ref 0.7–1.3)
Gamma Glob SerPl Elph-Mcnc: 1.4 g/dL (ref 0.4–1.8)
Globulin, Total: 3.5 g/dL (ref 2.2–3.9)
IgA: 385 mg/dL (ref 64–422)
IgG (Immunoglobin G), Serum: 1442 mg/dL (ref 586–1602)
IgM (Immunoglobulin M), Srm: 148 mg/dL (ref 26–217)
Total Protein ELP: 7.2 g/dL (ref 6.0–8.5)

## 2023-09-21 ENCOUNTER — Other Ambulatory Visit: Payer: Self-pay | Admitting: *Deleted

## 2023-09-21 DIAGNOSIS — R768 Other specified abnormal immunological findings in serum: Secondary | ICD-10-CM | POA: Diagnosis not present

## 2023-09-21 DIAGNOSIS — D8989 Other specified disorders involving the immune mechanism, not elsewhere classified: Secondary | ICD-10-CM

## 2023-09-24 ENCOUNTER — Emergency Department (HOSPITAL_COMMUNITY)

## 2023-09-24 ENCOUNTER — Encounter (HOSPITAL_COMMUNITY): Payer: Self-pay | Admitting: Emergency Medicine

## 2023-09-24 ENCOUNTER — Inpatient Hospital Stay (HOSPITAL_COMMUNITY)
Admission: EM | Admit: 2023-09-24 | Discharge: 2023-09-26 | DRG: 189 | Disposition: A | Discharge status: Skilled Nursing Facility | Source: Skilled Nursing Facility | Attending: Internal Medicine | Admitting: Internal Medicine

## 2023-09-24 ENCOUNTER — Other Ambulatory Visit: Payer: Self-pay

## 2023-09-24 DIAGNOSIS — E875 Hyperkalemia: Secondary | ICD-10-CM | POA: Diagnosis present

## 2023-09-24 DIAGNOSIS — I4891 Unspecified atrial fibrillation: Secondary | ICD-10-CM | POA: Diagnosis present

## 2023-09-24 DIAGNOSIS — I130 Hypertensive heart and chronic kidney disease with heart failure and stage 1 through stage 4 chronic kidney disease, or unspecified chronic kidney disease: Secondary | ICD-10-CM | POA: Diagnosis present

## 2023-09-24 DIAGNOSIS — C9000 Multiple myeloma not having achieved remission: Secondary | ICD-10-CM | POA: Diagnosis present

## 2023-09-24 DIAGNOSIS — Z7901 Long term (current) use of anticoagulants: Secondary | ICD-10-CM

## 2023-09-24 DIAGNOSIS — Z7951 Long term (current) use of inhaled steroids: Secondary | ICD-10-CM

## 2023-09-24 DIAGNOSIS — J9621 Acute and chronic respiratory failure with hypoxia: Principal | ICD-10-CM | POA: Diagnosis present

## 2023-09-24 DIAGNOSIS — I959 Hypotension, unspecified: Secondary | ICD-10-CM | POA: Diagnosis present

## 2023-09-24 DIAGNOSIS — R0689 Other abnormalities of breathing: Secondary | ICD-10-CM

## 2023-09-24 DIAGNOSIS — J9622 Acute and chronic respiratory failure with hypercapnia: Secondary | ICD-10-CM | POA: Diagnosis present

## 2023-09-24 DIAGNOSIS — H409 Unspecified glaucoma: Secondary | ICD-10-CM | POA: Diagnosis present

## 2023-09-24 DIAGNOSIS — R0902 Hypoxemia: Secondary | ICD-10-CM

## 2023-09-24 DIAGNOSIS — N179 Acute kidney failure, unspecified: Secondary | ICD-10-CM | POA: Diagnosis present

## 2023-09-24 DIAGNOSIS — J9610 Chronic respiratory failure, unspecified whether with hypoxia or hypercapnia: Secondary | ICD-10-CM

## 2023-09-24 DIAGNOSIS — I5032 Chronic diastolic (congestive) heart failure: Secondary | ICD-10-CM | POA: Diagnosis present

## 2023-09-24 DIAGNOSIS — N1832 Chronic kidney disease, stage 3b: Secondary | ICD-10-CM | POA: Diagnosis present

## 2023-09-24 DIAGNOSIS — I739 Peripheral vascular disease, unspecified: Secondary | ICD-10-CM | POA: Diagnosis present

## 2023-09-24 DIAGNOSIS — E66813 Obesity, class 3: Secondary | ICD-10-CM

## 2023-09-24 DIAGNOSIS — Z6841 Body Mass Index (BMI) 40.0 and over, adult: Secondary | ICD-10-CM

## 2023-09-24 DIAGNOSIS — Z8249 Family history of ischemic heart disease and other diseases of the circulatory system: Secondary | ICD-10-CM

## 2023-09-24 DIAGNOSIS — E8729 Other acidosis: Secondary | ICD-10-CM | POA: Diagnosis present

## 2023-09-24 DIAGNOSIS — Z79899 Other long term (current) drug therapy: Secondary | ICD-10-CM

## 2023-09-24 DIAGNOSIS — Z66 Do not resuscitate: Secondary | ICD-10-CM | POA: Diagnosis present

## 2023-09-24 DIAGNOSIS — F419 Anxiety disorder, unspecified: Secondary | ICD-10-CM | POA: Diagnosis present

## 2023-09-24 DIAGNOSIS — E662 Morbid (severe) obesity with alveolar hypoventilation: Secondary | ICD-10-CM | POA: Diagnosis present

## 2023-09-24 DIAGNOSIS — J441 Chronic obstructive pulmonary disease with (acute) exacerbation: Principal | ICD-10-CM | POA: Diagnosis present

## 2023-09-24 LAB — CBC WITH DIFFERENTIAL/PLATELET
Abs Immature Granulocytes: 0.05 K/uL (ref 0.00–0.07)
Basophils Absolute: 0 K/uL (ref 0.0–0.1)
Basophils Relative: 0 %
Eosinophils Absolute: 0 K/uL (ref 0.0–0.5)
Eosinophils Relative: 0 %
HCT: 36.1 % (ref 36.0–46.0)
Hemoglobin: 10.9 g/dL — ABNORMAL LOW (ref 12.0–15.0)
Immature Granulocytes: 1 %
Lymphocytes Relative: 9 %
Lymphs Abs: 0.8 K/uL (ref 0.7–4.0)
MCH: 32.2 pg (ref 26.0–34.0)
MCHC: 30.2 g/dL (ref 30.0–36.0)
MCV: 106.8 fL — ABNORMAL HIGH (ref 80.0–100.0)
Monocytes Absolute: 0.4 K/uL (ref 0.1–1.0)
Monocytes Relative: 4 %
Neutro Abs: 7.6 K/uL (ref 1.7–7.7)
Neutrophils Relative %: 86 %
Platelets: 174 K/uL (ref 150–400)
RBC: 3.38 MIL/uL — ABNORMAL LOW (ref 3.87–5.11)
RDW: 13.7 % (ref 11.5–15.5)
WBC: 8.9 K/uL (ref 4.0–10.5)
nRBC: 0 % (ref 0.0–0.2)

## 2023-09-24 LAB — BASIC METABOLIC PANEL WITH GFR
Anion gap: 10 (ref 5–15)
BUN: 40 mg/dL — ABNORMAL HIGH (ref 8–23)
CO2: 32 mmol/L (ref 22–32)
Calcium: 8.5 mg/dL — ABNORMAL LOW (ref 8.9–10.3)
Chloride: 96 mmol/L — ABNORMAL LOW (ref 98–111)
Creatinine, Ser: 2.65 mg/dL — ABNORMAL HIGH (ref 0.44–1.00)
GFR, Estimated: 18 mL/min — ABNORMAL LOW (ref >60)
Glucose, Bld: 133 mg/dL — ABNORMAL HIGH (ref 70–99)
Potassium: 5.6 mmol/L — ABNORMAL HIGH (ref 3.5–5.1)
Sodium: 138 mmol/L (ref 135–145)

## 2023-09-24 LAB — I-STAT VENOUS BLOOD GAS, ED
Acid-Base Excess: 8 mmol/L — ABNORMAL HIGH (ref 0.0–2.0)
Acid-Base Excess: 8 mmol/L — ABNORMAL HIGH (ref 0.0–2.0)
Bicarbonate: 36.9 mmol/L — ABNORMAL HIGH (ref 20.0–28.0)
Bicarbonate: 37.6 mmol/L — ABNORMAL HIGH (ref 20.0–28.0)
Calcium, Ion: 1.07 mmol/L — ABNORMAL LOW (ref 1.15–1.40)
Calcium, Ion: 1.07 mmol/L — ABNORMAL LOW (ref 1.15–1.40)
HCT: 34 % — ABNORMAL LOW (ref 36.0–46.0)
HCT: 35 % — ABNORMAL LOW (ref 36.0–46.0)
Hemoglobin: 11.6 g/dL — ABNORMAL LOW (ref 12.0–15.0)
Hemoglobin: 11.9 g/dL — ABNORMAL LOW (ref 12.0–15.0)
O2 Saturation: 65 %
O2 Saturation: 80 %
Potassium: 5.5 mmol/L — ABNORMAL HIGH (ref 3.5–5.1)
Potassium: 5.4 mmol/L — ABNORMAL HIGH (ref 3.5–5.1)
Sodium: 139 mmol/L (ref 135–145)
Sodium: 139 mmol/L (ref 135–145)
TCO2: 39 mmol/L — ABNORMAL HIGH (ref 22–32)
TCO2: 40 mmol/L — ABNORMAL HIGH (ref 22–32)
pCO2, Ven: 79.5 mmHg (ref 44–60)
pCO2, Ven: 80.9 mmHg (ref 44–60)
pH, Ven: 7.274 (ref 7.25–7.43)
pH, Ven: 7.275 (ref 7.25–7.43)
pO2, Ven: 40 mmHg (ref 32–45)
pO2, Ven: 53 mmHg — ABNORMAL HIGH (ref 32–45)

## 2023-09-24 LAB — BLOOD GAS, VENOUS
Acid-Base Excess: 4.7 mmol/L — ABNORMAL HIGH (ref 0.0–2.0)
Bicarbonate: 35.2 mmol/L — ABNORMAL HIGH (ref 20.0–28.0)
O2 Saturation: 91.8 %
Patient temperature: 37
pCO2, Ven: 84 mmHg (ref 44–60)
pH, Ven: 7.23 — ABNORMAL LOW (ref 7.25–7.43)
pO2, Ven: 60 mmHg — ABNORMAL HIGH (ref 32–45)

## 2023-09-24 LAB — TROPONIN I (HIGH SENSITIVITY)
Troponin I (High Sensitivity): 45 ng/L — ABNORMAL HIGH (ref <18)
Troponin I (High Sensitivity): 44 ng/L — ABNORMAL HIGH (ref <18)

## 2023-09-24 LAB — MRSA NEXT GEN BY PCR, NASAL: MRSA by PCR Next Gen: DETECTED — AB

## 2023-09-24 LAB — BRAIN NATRIURETIC PEPTIDE: B Natriuretic Peptide: 224.4 pg/mL — ABNORMAL HIGH (ref 0.0–100.0)

## 2023-09-24 MED ORDER — SENNOSIDES-DOCUSATE SODIUM 8.6-50 MG PO TABS
1.0000 | ORAL_TABLET | Freq: Every evening | ORAL | Status: DC | PRN
  Administered 2023-09-24: 1 via ORAL
  Filled 2023-09-24: qty 1

## 2023-09-24 MED ORDER — WHITE PETROLATUM EX OINT
TOPICAL_OINTMENT | CUTANEOUS | Status: DC | PRN
  Administered 2023-09-25: 0.2 via TOPICAL
  Filled 2023-09-24: qty 28.35

## 2023-09-24 MED ORDER — FUROSEMIDE 10 MG/ML IJ SOLN
40.0000 mg | Freq: Once | INTRAMUSCULAR | Status: AC
  Administered 2023-09-24: 40 mg via INTRAVENOUS
  Filled 2023-09-24: qty 4

## 2023-09-24 MED ORDER — MIDODRINE HCL 5 MG PO TABS
2.5000 mg | ORAL_TABLET | Freq: Three times a day (TID) | ORAL | Status: DC
  Administered 2023-09-24 – 2023-09-26 (×7): 2.5 mg via ORAL
  Filled 2023-09-24 (×7): qty 1

## 2023-09-24 MED ORDER — APIXABAN 5 MG PO TABS
5.0000 mg | ORAL_TABLET | Freq: Two times a day (BID) | ORAL | Status: DC
  Filled 2023-09-24: qty 1

## 2023-09-24 MED ORDER — PREDNISONE 20 MG PO TABS
40.0000 mg | ORAL_TABLET | Freq: Every day | ORAL | Status: DC
  Administered 2023-09-25 – 2023-09-26 (×2): 40 mg via ORAL
  Filled 2023-09-24 (×2): qty 2

## 2023-09-24 MED ORDER — AZITHROMYCIN 500 MG PO TABS
500.0000 mg | ORAL_TABLET | Freq: Every day | ORAL | Status: AC
  Administered 2023-09-24 – 2023-09-26 (×3): 500 mg via ORAL
  Filled 2023-09-24 (×3): qty 1

## 2023-09-24 MED ORDER — HYDROXYZINE HCL 25 MG PO TABS
25.0000 mg | ORAL_TABLET | Freq: Three times a day (TID) | ORAL | Status: DC | PRN
  Administered 2023-09-25: 25 mg via ORAL
  Filled 2023-09-24: qty 1

## 2023-09-24 MED ORDER — LATANOPROST 0.005 % OP SOLN
1.0000 [drp] | Freq: Every day | OPHTHALMIC | Status: DC
  Administered 2023-09-24 – 2023-09-25 (×2): 1 [drp] via OPHTHALMIC
  Filled 2023-09-24: qty 2.5

## 2023-09-24 MED ORDER — MUPIROCIN 2 % EX OINT
1.0000 | TOPICAL_OINTMENT | Freq: Two times a day (BID) | CUTANEOUS | Status: DC
Start: 2023-09-24 — End: 2023-09-29
  Administered 2023-09-24 – 2023-09-26 (×4): 1 via NASAL
  Filled 2023-09-24: qty 22

## 2023-09-24 MED ORDER — APIXABAN 2.5 MG PO TABS
2.5000 mg | ORAL_TABLET | Freq: Two times a day (BID) | ORAL | Status: DC
  Administered 2023-09-24 – 2023-09-26 (×4): 2.5 mg via ORAL
  Filled 2023-09-24 (×4): qty 1

## 2023-09-24 MED ORDER — GUAIFENESIN ER 600 MG PO TB12
600.0000 mg | ORAL_TABLET | Freq: Two times a day (BID) | ORAL | Status: DC
  Administered 2023-09-24 – 2023-09-26 (×5): 600 mg via ORAL
  Filled 2023-09-24 (×5): qty 1

## 2023-09-24 MED ORDER — MAGNESIUM SULFATE 2 GM/50ML IV SOLN
2.0000 g | Freq: Once | INTRAVENOUS | Status: AC
  Administered 2023-09-24: 2 g via INTRAVENOUS
  Filled 2023-09-24: qty 50

## 2023-09-24 MED ORDER — IPRATROPIUM-ALBUTEROL 0.5-2.5 (3) MG/3ML IN SOLN
3.0000 mL | RESPIRATORY_TRACT | Status: AC
  Administered 2023-09-24 – 2023-09-25 (×6): 3 mL via RESPIRATORY_TRACT
  Filled 2023-09-24 (×6): qty 3

## 2023-09-24 MED ORDER — GABAPENTIN 100 MG PO CAPS
100.0000 mg | ORAL_CAPSULE | Freq: Every day | ORAL | Status: DC
  Administered 2023-09-24 – 2023-09-25 (×2): 100 mg via ORAL
  Filled 2023-09-24 (×2): qty 1

## 2023-09-24 MED ORDER — CHLORHEXIDINE GLUCONATE CLOTH 2 % EX PADS
6.0000 | MEDICATED_PAD | Freq: Every day | CUTANEOUS | Status: DC
  Administered 2023-09-24 – 2023-09-25 (×2): 6 via TOPICAL

## 2023-09-24 NOTE — ED Provider Notes (Signed)
 [facility] Provider Note   CSN: [id number] Arrival date & time: 09/24/23  9560     History Chief Complaint  Patient presents with   Shortness of Breath    HPI [place] is a 78 y.o. female presenting for acute shortness of breath and respiratory distress.  History of COPD, heart failure, morbid obesity. Patient states that since yesterday she has had shortness of breath. States that she is having problems catching her breath to even talk through sentences..   Patient's recorded medical, surgical, social, medication list and allergies were reviewed in the Snapshot window as part of the initial history.   Review of Systems   Review of Systems  Constitutional:  Positive for fatigue. Negative for chills and fever.  HENT:  Negative for ear pain and sore throat.   Eyes:  Negative for pain and visual disturbance.  Respiratory:  Positive for cough, shortness of breath and wheezing.   Cardiovascular:  Negative for chest pain and palpitations.  Gastrointestinal:  Negative for abdominal pain and vomiting.  Genitourinary:  Negative for dysuria and hematuria.  Musculoskeletal:  Negative for arthralgias and back pain.  Skin:  Negative for color change and rash.  Neurological:  Positive for weakness. Negative for seizures and syncope.  All other systems reviewed and are negative.   Physical Exam Updated Vital Signs BP 113/75   Pulse 98   Temp 97.8 F (36.6 C) (Oral)   Resp 19   Ht 5' 5 (1.651 m)   Wt (!) 195.7 kg   SpO2 97%   BMI 71.80 kg/m  Physical Exam Vitals and nursing note reviewed.  Constitutional:      General: She is not in acute distress.    Appearance: She is well-developed.  HENT:     Head: Normocephalic and atraumatic.  Eyes:     Conjunctiva/sclera: Conjunctivae normal.  Cardiovascular:     Rate and Rhythm: Normal rate and regular rhythm.     Heart sounds: No murmur heard. Pulmonary:     Effort:  Pulmonary effort is normal. No respiratory distress.     Breath sounds: Normal breath sounds.  Abdominal:     General: There is no distension.     Palpations: Abdomen is soft.     Tenderness: There is no abdominal tenderness. There is no right CVA tenderness or left CVA tenderness.  Musculoskeletal:        General: No swelling or tenderness. Normal range of motion.     Cervical back: Neck supple.  Skin:    General: Skin is warm and dry.  Neurological:     General: No focal deficit present.     Mental Status: She is alert and oriented to person, place, and time. Mental status is at baseline.     Cranial Nerves: No cranial nerve deficit.      ED Course/ Medical Decision Making/ A&P    Procedures .Critical Care  Performed by: Jerral Meth, MD Authorized by: Jerral Meth, MD   Critical care provider statement:    Critical care time (minutes):  30   Critical care was necessary to treat or prevent imminent or life-threatening deterioration of the following conditions:  Respiratory failure   Critical care was time spent personally by me on the following activities:  Development of treatment plan with patient or surrogate, discussions with consultants, evaluation of patient's response to treatment, examination of patient, ordering and review of laboratory studies, ordering and review of radiographic studies, ordering  and performing treatments and interventions, pulse oximetry, re-evaluation of patient's condition and review of old charts    Medications Ordered in ED Medications  furosemide  (LASIX ) injection 40 mg (has no administration in time range)  ipratropium-albuterol  (DUONEB) 0.5-2.5 (3) MG/3ML nebulizer solution 3 mL (has no administration in time range)  magnesium  sulfate IVPB 2 g 50 mL (has no administration in time range)   Medical Decision Making:   Kierston Plasencia is a 78 y.o. female with a history of COPD, who presented to the ED today with acute on chronic  SOB. They are endorsing worsening of their baseline dyspnea over the past 24 hours. Their baseline is a 0L O2 requirement.  On my initial exam, the pt was SOB and tachypneic. Audible wheezing and grossly decreased breath sounds appreciated.  They are endorsing increased sputum production.    Reviewed and confirmed nursing documentation for past medical history, family history, social history.    Initial Assessment:   With the patient's presentation of SOB in the above setting, most likely diagnosis is COPD Exacerbation. Other diagnoses were considered including (but not limited to) CAP, PE, ACS, viral infection, PTX. These are considered less likely due to history of present illness and physical exam findings.   This is most consistent with an acute life/limb threatening illness complicated by underlying chronic conditions.  Initial Plan:  Empiric treatment of patient's symptoms with immediate initiation of inhaled bronchodilators and IV steroids. Given advanced nature of patient's presentation, will proceed with IV magnesium  as a rescue therapy.   Given extremis nature of the presentation, patient required non invasive ventilation initiation.  Evaluation for ACS with EKG and delta troponin  Evaluation for infectious versus intrathoracic abnormality with chest x-ray  Evaluation for volume overload with BNP  Screening labs including CBC and Metabolic panel to evaluate for infectious or metabolic etiology of disease.  Patient's Wells score is low and patient does not warrant further objective evaluation for PE based on consistency of presentation of alternative diagnosis.  Objective evaluation as below reviewed   Initial Study Results:   Laboratory  All laboratory results reviewed without evidence of clinically relevant pathology.    EKG EKG was reviewed independently. Rate, rhythm, axis, intervals all examined and without medically relevant abnormality. ST segments without concerns for  elevations.    Radiology:  All images reviewed independently. Agree with radiology report at this time.   DG Chest Portable 1 View Result Date: 09/24/2023 CLINICAL DATA:  Shortness of breath. EXAM: PORTABLE CHEST 1 VIEW COMPARISON:  11/21/2020 FINDINGS: Stable cardiac enlargement. Aortic atherosclerosis. Mild thickening of the minor fissure. Increased interstitial markings. No airspace opacities. Visualized osseous structures appear intact. IMPRESSION: Cardiac enlargement and increased interstitial markings compatible with mild congestive heart failure. Electronically Signed   By: Waddell Calk M.D.   On: 09/24/2023 05:37    Final Assessment and Plan:   Patient currently being titrated on the BiPAP at this time.  Will repeat her breathing treatments and repeat her VBG to ensure she is stable for the floor rather than the ICU.  Care patient signed out to oncoming team pending this reassessment.  Clinical Impression:  1. COPD exacerbation (HCC)   2. Hypoxia   3. Hypercapnia      Data Unavailable   Final Clinical Impression(s) / ED Diagnoses Final diagnoses:  COPD exacerbation (HCC)  Hypoxia  Hypercapnia    Rx / DC Orders ED Discharge Orders     None  Jerral Meth, MD 09/24/23 [phone]

## 2023-09-24 NOTE — H&P (Cosign Needed)
 Date: 09/24/2023               Patient Name:  Jean Stewart MRN: [id number]  DOB: 02/01/1946 Age / Sex: 78 y.o., female   PCP: Patient, No Pcp Per         Medical Service: Internal Medicine Teaching Service         Attending Physician: Dr. Mliss Pouch      First Contact: Remonia Romano, DO}    Second Contact: Dr. Ozell Nearing, DO          Pager Information: First Contact Pager: [phone]   Second Contact Pager: [phone]   SUBJECTIVE   Chief Complaint: shortness of breath   History of Present Illness: Jean Stewart is a 78 y.o. female with PMH of HFpEF 55 to 60%, Atrial fibrillation, COPD, sleep apnea, hypertension presented with complaints of shortness of breath . History was limited as patient had BiPAP mask on and patient's respiratory rate was having increased work of breathing while talking. Patient was alert. Patient does endorse a cough with productive mucus and subjective fever. Patient does denies chills.Patient also does endorse nausea with 2 episodes of emesis. Patient also endorses that she has increased swelling of her legs recently.    ED Course: Vitals: T 97.8 RR 19 HR 97 BP 113/75 SpO2 97% on [place] 4 L  Labs significant for K 5.6, BUN 40, Cr 2.65 Bnp 224.4  Troponin 45>44  Initial  VBG showed pCO2 79.5, 80.9 to 84  Imaging CXR shows cardiac enlargement and increased interstitial markings Received lasix  40 mg , Duoneb, Mg, 1G rocephin    Past Medical History COPD  HFpEF 55 to 60%   Atrial fibrillation Hypertension IBS  Morbid Obesity  Sleep apnea Peripheral vascular disease  Glaucoma  Past Surgical History Past Surgical History:  Procedure Laterality Date   EXTERNAL FIXATION LEG Left 12/03/2018   Procedure: EXTERNAL FIXATION LEG;  Surgeon: Kendal Franky SQUIBB, MD;  Location: [facility] OR;  Service: Orthopedics;  Laterality: Left;   EXTERNAL FIXATION REMOVAL Left 01/25/2019   Procedure: REMOVAL EXTERNAL FIXATION LEG;  Surgeon: Kendal Franky SQUIBB, MD;  Location:  [facility] OR;  Service: Orthopedics;  Laterality: Left;   I & D EXTREMITY Left 12/03/2018   Procedure: IRRIGATION AND DEBRIDEMENT EXTREMITY;  Surgeon: Kendal Franky SQUIBB, MD;  Location: [facility] OR;  Service: Orthopedics;  Laterality: Left;   ORIF ANKLE FRACTURE Left 12/03/2018   Procedure: OPEN REDUCTION INTERNAL FIXATION (ORIF) ANKLE FRACTURE;  Surgeon: Kendal Franky SQUIBB, MD;  Location: [facility] OR;  Service: Orthopedics;  Laterality: Left;    Meds:  -Eliquis  5 BID -gabapentin  100 mg daily  -latanoprost   -metoprolol  12.5 mg -midodrine  -torsemide 20 BID (takes only once) Current Meds  Medication Sig   apixaban  (ELIQUIS ) 5 MG TABS tablet Take 5 mg by mouth 2 (two) times daily.   cefTRIAXone  (ROCEPHIN ) 1 g injection Inject 1 g into the muscle at bedtime. For 7 days   Cholecalciferol  (D3) 25 MCG (1000 UT) capsule Take 1,000 Units by mouth daily.   gabapentin  (NEURONTIN ) 100 MG capsule Take 100 mg by mouth daily.   ipratropium-albuterol  (DUONEB) 0.5-2.5 (3) MG/3ML SOLN Take 3 mLs by nebulization in the morning, at noon, and at bedtime.   latanoprost  (XALATAN ) 0.005 % ophthalmic solution Place 1 drop into both eyes at bedtime.   metoprolol  succinate (TOPROL -XL) 25 MG 24 hr tablet Take 12.5 mg by mouth daily.   midodrine  (PROAMATINE ) 2.5 MG tablet Take 2.5 mg by mouth 3 (three) times daily.  Multiple Vitamins-Iron (MULTIVITAMINS WITH IRON) TABS tablet Take 1 tablet by mouth daily.   sennosides-docusate sodium  (SENOKOT-S) 8.6-50 MG tablet Take 1 tablet by mouth every 12 (twelve) hours.   simethicone (MYLICON) 80 MG chewable tablet Chew 80 mg by mouth daily. For 3 days   torsemide (DEMADEX) 20 MG tablet Take 20 mg by mouth 2 (two) times daily.    Social:  Lives With: [facility]  Support: daughter  Level of Function:  PCP: per facility coverage Substances: -Tobacco: denies -Alcohol: denies -Recreational Drug: denies  Family History:  Family History  Problem Relation Age of Onset   CAD Mother         MI at age 28     Allergies: Allergies as of 09/24/2023   (No Known Allergies)    Review of Systems: A complete ROS was negative except as per HPI.   OBJECTIVE:   Physical Exam: Blood pressure (!) 143/81, pulse 94, temperature 97.8 F (36.6 C), temperature source Oral, resp. rate (!) 23, height 5' 5 (1.651 m), weight (!) 195.7 kg, SpO2 100%.  Constitutional:Patient was sitting in ER bed on BiPAP  HENT: normocephalic atraumatic  Cardiovascular: regular rate and irregular rhythm Pulmonary/Chest: normal work of breathing on Bipap , lungs sounds diminished at bases occasional wheeze posterior bilaterally in lower lung bases Abdominal: soft, non-tender, non-distended MSK: Trace pitting edema in bilateral lower extremities  Neurological: alert & oriented x 3,Skin: warm and dry   Labs: CBC    Component Value Date/Time   WBC 8.9 09/24/2023 0446   RBC 3.38 (L) 09/24/2023 0446   HGB 11.9 (L) 09/24/2023 0743   HGB 10.8 (L) 09/15/2023 1200   HCT 35.0 (L) 09/24/2023 0743   PLT 174 09/24/2023 0446   PLT 130 (L) 09/15/2023 1200   MCV 106.8 (H) 09/24/2023 0446   MCH 32.2 09/24/2023 0446   MCHC 30.2 09/24/2023 0446   RDW 13.7 09/24/2023 0446   LYMPHSABS 0.8 09/24/2023 0446   MONOABS 0.4 09/24/2023 0446   EOSABS 0.0 09/24/2023 0446   BASOSABS 0.0 09/24/2023 0446     CMP     Component Value Date/Time   NA 139 09/24/2023 0743   K 5.4 (H) 09/24/2023 0743   CL 96 (L) 09/24/2023 0446   CO2 32 09/24/2023 0446   GLUCOSE 133 (H) 09/24/2023 0446   BUN 40 (H) 09/24/2023 0446   CREATININE 2.65 (H) 09/24/2023 0446   CREATININE 1.80 (H) 09/15/2023 1200   CALCIUM 8.5 (L) 09/24/2023 0446   PROT 7.6 09/15/2023 1200   ALBUMIN  3.8 09/15/2023 1200   AST 15 09/15/2023 1200   ALT 11 09/15/2023 1200   ALKPHOS 116 09/15/2023 1200   BILITOT 0.7 09/15/2023 1200   GFRNONAA 18 (L) 09/24/2023 0446   GFRNONAA 28 (L) 09/15/2023 1200   GFRAA 56 (L) 06/05/2019 1125    Imaging:  DG Chest Portable  1 View Result Date: 09/24/2023 CLINICAL DATA:  Shortness of breath. EXAM: PORTABLE CHEST 1 VIEW COMPARISON:  11/21/2020 FINDINGS: Stable cardiac enlargement. Aortic atherosclerosis. Mild thickening of the minor fissure. Increased interstitial markings. No airspace opacities. Visualized osseous structures appear intact. IMPRESSION: Cardiac enlargement and increased interstitial markings compatible with mild congestive heart failure. Electronically Signed   By: Waddell Calk M.D.   On: 09/24/2023 05:37       ASSESSMENT & PLAN:   Assessment & Plan by Problem: Principal Problem:   Acute on chronic hypoxic respiratory failure (HCC)   [place] is a 78 y.o. female with PMH  of HFpEF 55 to 60%, Atrial fibrillation, hypertension presented with complaints of shortness of breath and admitted for acute on chronic hypoxic respiratory failure with hypercarbia.   # Acute on Chronic Hypoxic and hypercarbic Respiratory Failure #COPD exacerbation - Likely secondary to obesity hypoventilation syndrome.  Unknown if patient uses CPAP or BiPAP at assisted living facility.  Patient chronically on 2L [place].   Patient was on BiPAP when visited in the ED.  VBG showed PCO2 of 79.5 and is now at 84 as of 10:30 this morning.  History was limited as patient had BiPAP mask on.  Patient did endorse cough with sputum production and subjective fever.  Patient has remained afebrile while in the ED and does not show leukocytosis.  Patient did not show signs of major volume overload on exam, low suspicion for heart failure exacerbation at this time. Due to history of COPD will start antibiotics at this time for COPD exacerbation. -Azithromycin  500 mg for 3 days -DuoNebs every 4 hours for 6 doses - Prednisone  40 mg for 4 days, patient got steroids via EMS -Mucinex  2 times daily -incentive spirometer - Wean off of Bipap to home O2 , oxygen  saturation goal of 88-92%  -PT/OT consulted   #HFpEF 55 to 60% Patient did not  sound volume overloaded on exam.  Although she did endorse lower extremity swelling, patient did have just trace edema in lower extremities.  Patient received Lasix  40 mg in the ED.  She takes torsemide at home, although she says that she takes it only once a day when it is prescribed for twice a day. - Low suspicion for heart failure exacerbation at this time.  Will reassess volume status tomorrow and can start home diuretic once evaluated - Will monitor Is&Os and daily weights   #AKI on CKD 3B Patient follows with nephrology outpatient, does not appear to be at baseline.  In May patient's creatinine was 1.67 with a GFR of 31.  This is consistent with CKD stage IIIb.  - Received Lasix  in the ED -Trend renal function with BMP -Strict I's and O's  #A-fib on Eliquis  -Continue home Eliquis  -Patient is in A-fib per telemetry -Is currently rate controlled.  Patient takes metoprolol  at home but will hold in the setting of softer blood pressures  #Hypotension Patient is on home midodrine  2.5 mg 3 times daily.  Continue this in the hospital setting Has had softer blood pressures during the course of today.  Will continue to monitor   # Glaucoma Will continue home lantoprost eyedrops during admission  Best practice: Diet: Normal VTE: DOAC IVF:  Code: DNR  Disposition planning: Prior to Admission Living Arrangement: Assisted living facility Anticipated Discharge Location: Assisted living facility Barriers to Discharge: Respiratory improvement  Dispo: Admit patient to Observation with expected length of stay less than 2 midnights.  Signed: D'Mello, Stefania Goulart, DO Internal Medicine Resident  09/24/2023, 6:48 PM  Please contact IM Residency On-Call Pager at: [phone] or [phone].

## 2023-09-24 NOTE — ED Notes (Signed)
 Nt called CCMD@9 :14am

## 2023-09-24 NOTE — Plan of Care (Incomplete)
   Problem: Education: Goal: Knowledge of General Education information will improve Description Including pain rating scale, medication(s)/side effects and non-pharmacologic comfort measures Outcome: Progressing

## 2023-09-24 NOTE — ED Provider Notes (Signed)
 Signout from Dr. Jerral.  78 year old female here with shortness of breath.  History of COPD CHF.  Has received steroids and magnesium , diuretics.  VBG with CO2 retention.  Currently on BiPAP.  Plan is to reassess and recheck VBG after 1 hour on BiPAP.  Will admission to the hospital regardless. Physical Exam  BP (!) 143/81   Pulse 94   Temp 97.8 F (36.6 C) (Oral)   Resp (!) 23   Ht 5' 5 (1.651 m)   Wt (!) 195.7 kg   SpO2 98%   BMI 71.80 kg/m   Physical Exam  Procedures  Procedures  ED Course / MDM    Medical Decision Making Amount and/or Complexity of Data Reviewed Labs: ordered. Radiology: ordered.  Risk Prescription drug management. Decision regarding hospitalization.   8 AM.  VBG without significant change.  She however remains awake alert conversant.  Do not feel she needs intubation at this time.  8:30 AM.  Discussed with IM teaching service team.  They will come down to evaluate patient to see if they are comfortable with admitting her to their service.  9 AM.  Patient was seen by internal medicine team and they are accepting.     Jean Ozell BROCKS, MD 09/24/23 [phone]

## 2023-09-24 NOTE — Progress Notes (Signed)
 Pt transported on BiPAP from ED room 28 to ED room 12 without complications.

## 2023-09-24 NOTE — Hospital Course (Addendum)
#   Acute on Chronic Hypoxic and hypercarbic Respiratory Failure #COPD exacerbation Unsure if patient has had formal diagnosis of COPD. Obesity hypoventilation syndrome likely is playing a part in this too.  Patient reports that she does not use CPAP/ BiPAP machine at facility because she needs help putting it on and does not like the facemask.  Patient chronically on 2L [place]. VBG was initially normal pH with pCO2 of 79.5. Patient was on 4L of O2 initially when she came into the ED. Patient remained afebrile while in the ED and does not show leukocytosis.Patient did endorse a cough with some sputum production prior to coming to the hospital.   Patient did not show signs of major volume overload on exam, low suspicion for heart failure exacerbation.  Due to history of COPD gave antibiotics for COPD exacerbation. We started patient on azithromycin  500 mg for 3 days, prednisone  40 mg for 4 days, DuoNebs, mucinex . Physical therapy and occupation therapy was consulted. We weaned oxygen  down with a goal of 88-92%. Patient was able to go back to her 2L of oxygen  at the time of discharge. Patient was also started on LAMA/LABA at time of discharge for COPD exacerbation.   #HFpEF 55 to 60%  Patient did not sound volume overloaded on exam. Although she did endorse lower extremity swelling, patient did have just trace edema in lower extremities. Patient received Lasix  40 mg in the ED. She takes torsemide at home, although she says that she takes it only once a day when it is prescribed for twice a day. There was a low suspicion for heart failure exacerbation as patient did not sound volume overloaded on lung exam and lower extremities had trace edema that did improve. Patient was only given dose of IV lasix  40 mg in the ED but no other diuresis was given during admission and patient's breathing got better. We also monitored intake, output and daily weights during hospitalizations.  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  In May patient's creatinine was 1.67 with a GFR of 31.  This is consistent with CKD stage IIIb.  Patient has been getting worked up for kappa light chains found in urine outpatient. She had abnormal kappa/lambda light chain ratio. She was being worked up outpatient to rule out multiple myeloma.  Dating back to 2020, patient has had creatinine in the 1.5-2s range on and off. Creatinine at time of discharge was 2.58. Patient will need to follow up outpatient with nephrology for continued management.   #A-fib on Eliquis   Continued patient on home eliquis , but held metoprolol  due to hypotension. Patient's rates were relatively rate controlled during admission, although she continued to stay in Afib throughout admission. Can assess for need of this medication after hospitalization.   #Hypotension Patient is on home midodrine  2.5 mg 3 times daily.  Continued this in the hospital setting in light of patient having softer blood pressures.

## 2023-09-24 NOTE — ED Triage Notes (Signed)
 Pt presents to the ED via [facility] from [facility] with complaints of hypoxia  x 1 day. Pt receive solu-medrol, duo neb, lasix , and was placed on NRB at the facility. Pt was titrated to 4L [place] by EMS and pt has tolerated it well. A&Ox4 at this time. Denies CP or SOB.

## 2023-09-25 DIAGNOSIS — Z66 Do not resuscitate: Secondary | ICD-10-CM | POA: Diagnosis present

## 2023-09-25 DIAGNOSIS — J9621 Acute and chronic respiratory failure with hypoxia: Secondary | ICD-10-CM | POA: Diagnosis present

## 2023-09-25 DIAGNOSIS — I130 Hypertensive heart and chronic kidney disease with heart failure and stage 1 through stage 4 chronic kidney disease, or unspecified chronic kidney disease: Secondary | ICD-10-CM | POA: Diagnosis present

## 2023-09-25 DIAGNOSIS — Z79899 Other long term (current) drug therapy: Secondary | ICD-10-CM | POA: Diagnosis not present

## 2023-09-25 DIAGNOSIS — R0689 Other abnormalities of breathing: Secondary | ICD-10-CM | POA: Diagnosis present

## 2023-09-25 DIAGNOSIS — I959 Hypotension, unspecified: Secondary | ICD-10-CM | POA: Diagnosis present

## 2023-09-25 DIAGNOSIS — J441 Chronic obstructive pulmonary disease with (acute) exacerbation: Secondary | ICD-10-CM | POA: Diagnosis present

## 2023-09-25 DIAGNOSIS — I5032 Chronic diastolic (congestive) heart failure: Secondary | ICD-10-CM | POA: Diagnosis present

## 2023-09-25 DIAGNOSIS — Z7901 Long term (current) use of anticoagulants: Secondary | ICD-10-CM | POA: Diagnosis not present

## 2023-09-25 DIAGNOSIS — N1832 Chronic kidney disease, stage 3b: Secondary | ICD-10-CM | POA: Diagnosis present

## 2023-09-25 DIAGNOSIS — H409 Unspecified glaucoma: Secondary | ICD-10-CM | POA: Diagnosis present

## 2023-09-25 DIAGNOSIS — Z6841 Body Mass Index (BMI) 40.0 and over, adult: Secondary | ICD-10-CM | POA: Diagnosis not present

## 2023-09-25 DIAGNOSIS — C9000 Multiple myeloma not having achieved remission: Secondary | ICD-10-CM | POA: Diagnosis present

## 2023-09-25 DIAGNOSIS — I4891 Unspecified atrial fibrillation: Secondary | ICD-10-CM | POA: Diagnosis present

## 2023-09-25 DIAGNOSIS — E875 Hyperkalemia: Secondary | ICD-10-CM | POA: Diagnosis present

## 2023-09-25 DIAGNOSIS — Z7951 Long term (current) use of inhaled steroids: Secondary | ICD-10-CM | POA: Diagnosis not present

## 2023-09-25 DIAGNOSIS — E66813 Obesity, class 3: Secondary | ICD-10-CM | POA: Diagnosis not present

## 2023-09-25 DIAGNOSIS — N179 Acute kidney failure, unspecified: Secondary | ICD-10-CM | POA: Diagnosis present

## 2023-09-25 DIAGNOSIS — E8729 Other acidosis: Secondary | ICD-10-CM | POA: Diagnosis present

## 2023-09-25 DIAGNOSIS — Z8249 Family history of ischemic heart disease and other diseases of the circulatory system: Secondary | ICD-10-CM | POA: Diagnosis not present

## 2023-09-25 DIAGNOSIS — I739 Peripheral vascular disease, unspecified: Secondary | ICD-10-CM | POA: Diagnosis present

## 2023-09-25 DIAGNOSIS — E662 Morbid (severe) obesity with alveolar hypoventilation: Secondary | ICD-10-CM | POA: Diagnosis present

## 2023-09-25 DIAGNOSIS — F419 Anxiety disorder, unspecified: Secondary | ICD-10-CM | POA: Diagnosis present

## 2023-09-25 DIAGNOSIS — J9622 Acute and chronic respiratory failure with hypercapnia: Secondary | ICD-10-CM | POA: Diagnosis present

## 2023-09-25 LAB — BASIC METABOLIC PANEL WITH GFR
Anion gap: 13 (ref 5–15)
BUN: 49 mg/dL — ABNORMAL HIGH (ref 8–23)
CO2: 29 mmol/L (ref 22–32)
Calcium: 8.4 mg/dL — ABNORMAL LOW (ref 8.9–10.3)
Chloride: 96 mmol/L — ABNORMAL LOW (ref 98–111)
Creatinine, Ser: 2.71 mg/dL — ABNORMAL HIGH (ref 0.44–1.00)
GFR, Estimated: 17 mL/min — ABNORMAL LOW (ref >60)
Glucose, Bld: 157 mg/dL — ABNORMAL HIGH (ref 70–99)
Potassium: 5.1 mmol/L (ref 3.5–5.1)
Sodium: 138 mmol/L (ref 135–145)

## 2023-09-25 LAB — UPEP/UIFE/LIGHT CHAINS/TP, 24-HR UR
% BETA, Urine: 14.1 %
ALPHA 1 URINE: 3.5 %
Albumin, U: 31.8 %
Alpha 2, Urine: 4.8 %
Free Kappa Lt Chains,Ur: 68.65 mg/L (ref 1.17–86.46)
Free Kappa/Lambda Ratio: 32.85 — ABNORMAL HIGH (ref 1.83–14.26)
Free Lambda Lt Chains,Ur: 2.09 mg/L (ref 0.27–15.21)
GAMMA GLOBULIN URINE: 45.8 %
M-SPIKE %, Urine: 26.8 % — ABNORMAL HIGH
M-Spike, Mg/24 Hr: 19 mg/(24.h) — ABNORMAL HIGH
Total Protein, Urine-Ur/day: 70 mg/(24.h) (ref 30–150)
Total Protein, Urine: 5.8 mg/dL
Total Volume: 1200

## 2023-09-25 LAB — CBC
HCT: 33.9 % — ABNORMAL LOW (ref 36.0–46.0)
Hemoglobin: 10.4 g/dL — ABNORMAL LOW (ref 12.0–15.0)
MCH: 31.9 pg (ref 26.0–34.0)
MCHC: 30.7 g/dL (ref 30.0–36.0)
MCV: 104 fL — ABNORMAL HIGH (ref 80.0–100.0)
Platelets: 160 K/uL (ref 150–400)
RBC: 3.26 MIL/uL — ABNORMAL LOW (ref 3.87–5.11)
RDW: 13.2 % (ref 11.5–15.5)
WBC: 13.3 K/uL — ABNORMAL HIGH (ref 4.0–10.5)
nRBC: 0 % (ref 0.0–0.2)

## 2023-09-25 LAB — PROTEIN / CREATININE RATIO, URINE
Creatinine, Urine: 166 mg/dL
Protein Creatinine Ratio: 0.11 mg/mg{creat} (ref 0.00–0.15)
Total Protein, Urine: 19 mg/dL

## 2023-09-25 LAB — BLOOD GAS, VENOUS
Acid-Base Excess: 5.8 mmol/L — ABNORMAL HIGH (ref 0.0–2.0)
Bicarbonate: 34.3 mmol/L — ABNORMAL HIGH (ref 20.0–28.0)
Drawn by: 8231
O2 Saturation: 79.6 %
Patient temperature: 36.4
pCO2, Ven: 71 mmHg (ref 44–60)
pH, Ven: 7.29 (ref 7.25–7.43)
pO2, Ven: 47 mmHg — ABNORMAL HIGH (ref 32–45)

## 2023-09-25 MED ORDER — UMECLIDINIUM-VILANTEROL 62.5-25 MCG/ACT IN AEPB
1.0000 | INHALATION_SPRAY | Freq: Every day | RESPIRATORY_TRACT | Status: DC
  Filled 2023-09-25: qty 14

## 2023-09-25 MED ORDER — UMECLIDINIUM-VILANTEROL 62.5-25 MCG/ACT IN AEPB
1.0000 | INHALATION_SPRAY | Freq: Every day | RESPIRATORY_TRACT | Status: DC
  Administered 2023-09-26: 1 via RESPIRATORY_TRACT
  Filled 2023-09-25: qty 14

## 2023-09-25 MED ORDER — IPRATROPIUM-ALBUTEROL 0.5-2.5 (3) MG/3ML IN SOLN: 3.0000 mL | RESPIRATORY_TRACT | Status: DC | PRN

## 2023-09-25 NOTE — Progress Notes (Signed)
 HD#0 SUBJECTIVE:  Patient Summary: Jean Stewart is a 78 y.o. female with PMH of HFpEF 55 to 60%, Atrial fibrillation, hypertension presented with complaints of shortness of breath and admitted for acute on chronic hypoxic respiratory failure with hypercarbia.   Overnight Events: Patient received hydroxyzine  for anxiety   Interim History: Patient was saying that she feels like her breathing is better than when she came in the hospital. She was on Luther earlier this morning, and said that she was feeling good. Patient reports that she is supposed to be wearing bipap/ cpap in the night at the facility, but has not been able to. Patient reports that nausea and vomiting episodes were a couple of days ago and not related to her current presentation. Patient also did endorse cough with sputum production a few days prior to admission, but she denied any fevers. No other complaints at this time.   OBJECTIVE:  Vital Signs: Vitals:   09/25/23 1112 09/25/23 1200 09/25/23 1300 09/25/23 1506  BP: 103/69 (!) 115/55 104/60 115/69  Pulse: 84 100 (!) 111 97  Resp: (!) 23 (!) 21 (!) 21 (!) 23  Temp: 97.9 F (36.6 C)   97.6 F (36.4 C)  TempSrc: Axillary   Oral  SpO2: 95% 97% 96% 99%  Weight:      Height:       Supplemental O2: Nasal Cannula SpO2: 99 % O2 Flow Rate (L/min): 2 L/min FiO2 (%): 40 %  Filed Weights   09/24/23 0444 09/25/23 0500  Weight: (!) 195.7 kg (!) 181.4 kg     Intake/Output Summary (Last 24 hours) at 09/25/2023 1517 Last data filed at 09/25/2023 1237 Gross per 24 hour  Intake 365 ml  Output 550 ml  Net -185 ml   Net IO Since Admission: -185 mL [09/25/23 1517]  Physical Exam: Physical Exam Cardiovascular:     Rate and Rhythm: Normal rate. Rhythm irregular.  Pulmonary:     Effort: Pulmonary effort is normal.     Breath sounds: Normal breath sounds.     Comments: Patient was on bipap  Musculoskeletal:     Right lower leg: No edema.     Left lower leg: No edema.   Neurological:     Mental Status: She is alert.     Patient Lines/Drains/Airways Status     Active Line/Drains/Airways     Name Placement date Placement time Site Days   Peripheral IV 09/24/23 20 G Left Antecubital 09/24/23  0446  Antecubital  1   Incision (Closed) 12/03/18 Ankle Left 12/03/18  1656  -- 1757   Incision (Closed) 01/25/19 Leg Left 01/25/19  0752  -- 1704            Pertinent labs and imaging:      Latest Ref Rng & Units 09/25/2023    2:20 AM 09/24/2023    7:43 AM 09/24/2023    5:51 AM  CBC  WBC 4.0 - 10.5 K/uL 13.3     Hemoglobin 12.0 - 15.0 g/dL 89.5  88.0  88.3   Hematocrit 36.0 - 46.0 % 33.9  35.0  34.0   Platelets 150 - 400 K/uL 160          Latest Ref Rng & Units 09/25/2023    2:20 AM 09/24/2023    7:43 AM 09/24/2023    5:51 AM  CMP  Glucose 70 - 99 mg/dL 842     BUN 8 - 23 mg/dL 49     Creatinine 9.55 - 1.00  mg/dL 7.28     Sodium 864 - 854 mmol/L 138  139  139   Potassium 3.5 - 5.1 mmol/L 5.1  5.4  5.5   Chloride 98 - 111 mmol/L 96     CO2 22 - 32 mmol/L 29     Calcium 8.9 - 10.3 mg/dL 8.4       No results found.  ASSESSMENT/PLAN:  Assessment: Principal Problem:   Acute on chronic hypoxic respiratory failure (HCC)   Plan: Jean Stewart is a 78 y.o. female with PMH of HFpEF 55 to 60%, Atrial fibrillation, hypertension presented with complaints of shortness of breath and admitted for acute on chronic hypoxic respiratory failure with hypercarbia.    # Acute on Chronic Hypoxic and hypercarbic Respiratory Failure #COPD exacerbation - Likely secondary to obesity hypoventilation syndrome.  Patient reports that she does not use CPAP/ BiPAP machine at facility  .  Patient chronically on 2L Spray. Patient reports that her breathing has been doing better since coming to the hospital. She was on bipap at bedside this morning but was on oxygen  earlier this morning and doing well.  -Azithromycin  500 mg for 3 days( day 2) -DuoNebs every 4 hours for 6  doses - Prednisone  40 mg for 4 days,(day 2)  -Mucinex  2 times daily -incentive spirometer - Wean off of Bipap to home O2 , oxygen  saturation goal of 88-92%  -PT/OT consulted. Patient at baseline usually only ambulates to the bathroom and back. Hope is to get her up to this baseline.    #HFpEF 55 to 60% Patient received Lasix  40 mg in the ED.  She takes torsemide at home, although she says that she takes it only once a day when it is prescribed for twice a day. - Low suspicion for heart failure exacerbation at this time. After volume assessment today, do not believe that further diuretics are warranted at this time.  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  In May patient's creatinine was 1.67 with a GFR of 31.  This is consistent with CKD stage IIIb.  -Patient has been getting worked up for kappa light chains found in urine outpatient. She had abnormal kappa/lambda light chain ratio. She was being worked up outpatient to rule out multiple myeloma. Patient was supposed to have skeletal survey scheduled for tomorrow. Will try to see if we can get this while inpatient, but if not will inform responsible party that she is in the hospital so this will be rescheduled.  This could be the cause of her worsening kidney function.  Dating back to 2020, patient has had creatinine in the 1.5-2s range on and off.  - Received Lasix  in the ED which could have caused further bump in creatinine. Will hold off on any more diuresis at this time  -Trend renal function with BMP -Strict I's and O's   #A-fib on Eliquis  -Continue home Eliquis  -Patient is in A-fib per telemetry -Has had rates below in the early 100s but has been relatively rate controlled.  Can restart metoprolol  if patient has rates above 120 sustained. Patient's blood pressures have been on the  softer side and therefore we have held this medication.    #Hypotension Patient is on home midodrine  2.5 mg 3 times daily.  Continue this in the hospital setting Has had softer blood pressures during the course  of today.  Will continue to monitor     # Glaucoma Will continue home lantoprost eyedrops during admission Best Practice: Diet: low sodium  IVF: none VTE: apixaban  (ELIQUIS ) tablet 2.5 mg Start: 09/24/23 2200 Code: Full  Disposition planning: Therapy Recs:  Family Contact: attempted to notify  DISPO: Anticipated discharge pending improvement in respiratory status   Signature:  Aniesa Boback D'Mello [facility]  3:17 PM, 09/25/2023  On Call pager [phone]

## 2023-09-25 NOTE — TOC Initial Note (Signed)
 Transition of Care [facility]) - Initial/Assessment Note    Patient Details  Name: Jean Stewart MRN: [id number] Date of Birth: 04/15/1945  [facility]) CM/SW Contact:    Lauraine FORBES Saa, LCSW Phone Number: 09/25/2023, 9:46 AM  Clinical Narrative:                  9:46 AM Per chart review, patient is from [facility]. SNF confirmed patient is LTC at [facility] and is able to return upon discharge.  Expected Discharge Plan: Long Term Nursing Home Barriers to Discharge: Continued Medical Work up   Patient Goals and CMS Choice            Expected Discharge Plan and Services In-house Referral: Clinical Social Work   Post Acute Care Choice: Skilled Nursing Facility Living arrangements for the past 2 months: Skilled Nursing Facility                                      Prior Living Arrangements/Services Living arrangements for the past 2 months: Skilled Nursing Facility Lives with:: Facility Resident Patient language and need for interpreter reviewed:: Yes          Care giver support system in place?: Yes (comment)   Criminal Activity/Legal Involvement Pertinent to Current Situation/Hospitalization: No - Comment as needed  Activities of Daily Living   ADL Screening (condition at time of admission) Independently performs ADLs?: No Does the patient have a NEW difficulty with bathing/dressing/toileting/self-feeding that is expected to last >3 days?: Yes (Initiates electronic notice to provider for possible OT consult) Does the patient have a NEW difficulty with getting in/out of bed, walking, or climbing stairs that is expected to last >3 days?: Yes (Initiates electronic notice to provider for possible PT consult) Does the patient have a NEW difficulty with communication that is expected to last >3 days?: Yes (Initiates electronic notice to provider for possible SLP consult) Is the patient deaf or have difficulty hearing?: No Does the patient have  difficulty seeing, even when wearing glasses/contacts?: No Does the patient have difficulty concentrating, remembering, or making decisions?: No  Permission Sought/Granted Permission sought to share information with : Family Supports, Oceanographer granted to share information with : No (Contact information on chart)  Share Information with NAME: Deloris Noto  Permission granted to share info w AGENCY: SNF  Permission granted to share info w Relationship: Daughter  Permission granted to share info w Contact Information: [phone]  Emotional Assessment       Orientation: : Oriented to Self, Oriented to  Time, Oriented to Situation, Oriented to Place Alcohol / Substance Use: Not Applicable Psych Involvement: No (comment)  Admission diagnosis:  Hypercapnia [R06.89] Hypoxia [R09.02] COPD exacerbation (HCC) [J44.1] Acute on chronic hypoxic respiratory failure (HCC) [J96.21] Patient Active Problem List   Diagnosis Date Noted   Acute on chronic hypoxic respiratory failure (HCC) 09/24/2023   Elevated serum kappa light chain 09/15/2023   Hyperkalemia    Type III open trimalleolar fracture of left ankle 01/22/2019   Open fracture dislocation of ankle 12/04/2018   Type III open trimalleolar fracture of ankle 12/04/2018   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HCC) 12/04/2018   Chronic anticoagulation 12/04/2018   Anemia of chronic disease 12/04/2018   Pressure injury of skin 05/12/2018   Atrial fibrillation with RVR (HCC) 05/11/2018   Obesity, Class III, BMI 40-49.9 (morbid obesity) 05/11/2018  Essential hypertension 05/11/2018   CKD (chronic kidney disease) stage 3, GFR 30-59 ml/min (HCC) 05/11/2018   Permanent atrial fibrillation (HCC) 05/11/2018   PCP:  Patient, No Pcp Per Pharmacy:   [facility] - [place], [place] - [address] [place] [address] Phone: [phone] Fax:  [phone]     Social Drivers of Health (SDOH) Social History: SDOH Screenings   Food Insecurity: No Food Insecurity (09/24/2023)  Housing: Low Risk  (09/24/2023)  Transportation Needs: No Transportation Needs (09/24/2023)  Utilities: Not At Risk (09/24/2023)  Depression (PHQ2-9): Low Risk  (09/15/2023)  Social Connections: Unknown (09/24/2023)  Tobacco Use: Low Risk  (09/24/2023)   SDOH Interventions:     Readmission Risk Interventions     No data to display

## 2023-09-25 NOTE — Plan of Care (Signed)
  Problem: Education: Goal: Knowledge of General Education information will improve Description: Including pain rating scale, medication(s)/side effects and non-pharmacologic comfort measures 09/25/2023 2017 by Valorie Inocente NOVAK, RN Outcome: Progressing 09/25/2023 2016 by Valorie Inocente NOVAK, RN Outcome: Progressing   Problem: Health Behavior/Discharge Planning: Goal: Ability to manage health-related needs will improve Outcome: Progressing   Problem: Clinical Measurements: Goal: Ability to maintain clinical measurements within normal limits will improve Outcome: Progressing Goal: Will remain free from infection Outcome: Progressing Goal: Diagnostic test results will improve Outcome: Progressing Goal: Respiratory complications will improve Outcome: Progressing Goal: Cardiovascular complication will be avoided Outcome: Progressing   Problem: Activity: Goal: Risk for activity intolerance will decrease Outcome: Progressing   Problem: Nutrition: Goal: Adequate nutrition will be maintained Outcome: Progressing   Problem: Coping: Goal: Level of anxiety will decrease Outcome: Progressing   Problem: Elimination: Goal: Will not experience complications related to bowel motility Outcome: Progressing Goal: Will not experience complications related to urinary retention Outcome: Progressing   Problem: Pain Managment: Goal: General experience of comfort will improve and/or be controlled Outcome: Progressing   Problem: Safety: Goal: Ability to remain free from injury will improve Outcome: Progressing   Problem: Skin Integrity: Goal: Risk for impaired skin integrity will decrease Outcome: Progressing

## 2023-09-25 NOTE — Plan of Care (Signed)
  Problem: Clinical Measurements: Goal: Respiratory complications will improve Outcome: Progressing Goal: Cardiovascular complication will be avoided Outcome: Progressing   Problem: Activity: Goal: Risk for activity intolerance will decrease Outcome: Progressing   Problem: Nutrition: Goal: Adequate nutrition will be maintained Outcome: Progressing   Problem: Coping: Goal: Level of anxiety will decrease Outcome: Progressing   Problem: Elimination: Goal: Will not experience complications related to bowel motility Outcome: Progressing Goal: Will not experience complications related to urinary retention Outcome: Progressing   Problem: Pain Managment: Goal: General experience of comfort will improve and/or be controlled Outcome: Progressing   Problem: Safety: Goal: Ability to remain free from injury will improve Outcome: Progressing   Problem: Skin Integrity: Goal: Risk for impaired skin integrity will decrease Outcome: Progressing

## 2023-09-25 NOTE — Progress Notes (Signed)
 Pt HR throughout the night has fluctuated from 90's-130 bpm in AFIB.

## 2023-09-25 NOTE — Evaluation (Signed)
 Physical Therapy Evaluation Patient Details Name: Jean Stewart MRN: [id number] DOB: March 23, 1945 Today's Date: 09/25/2023  History of Present Illness  Jean Stewart is a 78 y.o. female with PMH of HFpEF 55 to 60%, Atrial fibrillation, COPD, sleep apnea, hypertension presented with complaints of shortness of breath  Clinical Impression  Pt admitted with above diagnosis. Pt was able to mobilize to EOB without assist.  Needed assist to stand and limited by incr HR to 152 bpm.  Will attempt to progress pt when HR is under better control. Pt was still mobile at the SNF per pt. Will follow acutely.  Pt currently with functional limitations due to the deficits listed below (see PT Problem List). Pt will benefit from acute skilled PT to increase their independence and safety with mobility to allow discharge.           If plan is discharge home, recommend the following: A little help with walking and/or transfers;A little help with bathing/dressing/bathroom;Assistance with cooking/housework;Assist for transportation   Can travel by private vehicle   No    Equipment Recommendations None recommended by PT  Recommendations for Other Services       Functional Status Assessment Patient has had a recent decline in their functional status and demonstrates the ability to make significant improvements in function in a reasonable and predictable amount of time.     Precautions / Restrictions Precautions Precautions: Fall;Other (comment) Precaution/Restrictions Comments: watch HR Restrictions Weight Bearing Restrictions Per Provider Order: No      Mobility  Bed Mobility Overal bed mobility: Modified Independent             General bed mobility comments: extra time to sit EOB, used rails no physical assist required    Transfers Overall transfer level: Needs assistance Equipment used: Rolling walker (2 wheels) Transfers: Sit to/from Stand Sit to Stand: Mod assist            General transfer comment: sit - stand from EOB, HR increased from 96-152 bpm, fluctating at rest from 96-125. O2 SATs 92-95% on 2L O2.  Pt took side steps to [facility].    Ambulation/Gait                  Stairs            Wheelchair Mobility     Tilt Bed    Modified Rankin (Stroke Patients Only)       Balance Overall balance assessment: Needs assistance Sitting-balance support: Feet supported Sitting balance-Leahy Scale: Good     Standing balance support: Bilateral upper extremity supported Standing balance-Leahy Scale: Poor Standing balance comment: Relies on RW for support                             Pertinent Vitals/Pain Pain Assessment Pain Assessment: No/denies pain    Home Living Family/patient expects to be discharged to:: Skilled nursing facility                   Additional Comments: pt is a LTC resident of [facility] SNF, wears O2 at facility 1-2 Lat all times    Prior Function Prior Level of Function : Needs assist             Mobility Comments: pt reports that she was abot to SPT and was walking short distances to bathroom using RW with staff present ADLs Comments: Pt reports total A with LB ADLs and posteriro hygiene, set up with  UB ADLS, grooming     Extremity/Trunk Assessment   Upper Extremity Assessment Upper Extremity Assessment: Defer to OT evaluation    Lower Extremity Assessment Lower Extremity Assessment: Generalized weakness    Cervical / Trunk Assessment Cervical / Trunk Assessment: Kyphotic  Communication   Communication Communication: No apparent difficulties    Cognition Arousal: Alert Behavior During Therapy: WFL for tasks assessed/performed                             Following commands: Intact       Cueing Cueing Techniques: Verbal cues     General Comments General comments (skin integrity, edema, etc.): 95 bpm, 100% 2LO2, 112/95; 134 bpm sitting EOB, 93% 2LO2, 131/115; standing  152 bpm, 80% 2LO2, REcovered to 96% 2LO@ within minutes.  End of session -  129/59.    Exercises     Assessment/Plan    PT Assessment Patient needs continued PT services  PT Problem List Decreased balance;Decreased activity tolerance;Decreased knowledge of use of DME;Decreased safety awareness;Decreased knowledge of precautions;Decreased mobility;Cardiopulmonary status limiting activity       PT Treatment Interventions DME instruction;Gait training;Functional mobility training;Therapeutic activities;Therapeutic exercise;Balance training;Patient/family education    PT Goals (Current goals can be found in the Care Plan section)  Acute Rehab PT Goals Patient Stated Goal: to go back to facility PT Goal Formulation: With patient Time For Goal Achievement: 10/09/23 Potential to Achieve Goals: Fair    Frequency Min 2X/week     Co-evaluation               AM-PAC PT 6 Clicks Mobility  Outcome Measure Help needed turning from your back to your side while in a flat bed without using bedrails?: None Help needed moving from lying on your back to sitting on the side of a flat bed without using bedrails?: None Help needed moving to and from a bed to a chair (including a wheelchair)?: A Little Help needed standing up from a chair using your arms (e.g., wheelchair or bedside chair)?: A Lot Help needed to walk in hospital room?: Total Help needed climbing 3-5 steps with a railing? : Total 6 Click Score: 15    End of Session Equipment Utilized During Treatment: Gait belt;Oxygen  Activity Tolerance: Patient limited by fatigue Patient left: in bed;with call bell/phone within reach;with bed alarm set Nurse Communication: Mobility status PT Visit Diagnosis: Unsteadiness on feet (R26.81);Muscle weakness (generalized) (M62.81);Other abnormalities of gait and mobility (R26.89)    Time: 8556-8495 PT Time Calculation (min) (ACUTE ONLY): 21 min   Charges:   PT Evaluation $PT Eval Moderate  Complexity: 1 Mod   PT General Charges $$ ACUTE PT VISIT: 1 Visit         Letetia Romanello M,PT Acute Rehab Services [phone]   Jean Stewart Bevel 09/25/2023, 4:04 PM

## 2023-09-25 NOTE — Evaluation (Signed)
 Occupational Therapy Evaluation Patient Details Name: Jean Stewart MRN: [id number] DOB: 1945-05-03 Today's Date: 09/25/2023   History of Present Illness   Jean Stewart is a 78 y.o. female with PMH of HFpEF 55 to 60%, Atrial fibrillation, COPD, sleep apnea, hypertension presented with complaints of shortness of breath     Clinical Impressions Pt presents with decline in function and safety with ADLs and ADL mobility with impaired strength, balance and endurance. Pt is a LTC resident of a SNF and reports that at baseline she requires total A with LB ADLs and toileting hygiene, Set up with UB ADLs and grooming/hygiene. Pt currently requires CGA with UB ADLs and grooming tasks seated EOB. At rest before activity pt's HR 101, fluctuating 101-109, O2 SATs 92-95% on 4L O2. Pt sat EOB with no physical assist requiring use of rails and extra time. Pt stood at EOB min A and HR increased to 147. Pt returned to supine with Sup and was able to scoot up in bed with use of rails and bed controls. Pt would benefit from acute OT services to maximize level of function and safety before return to SNF     If plan is discharge home, recommend the following:   A lot of help with bathing/dressing/bathroom;A little help with walking and/or transfers;Assist for transportation;Direct supervision/assist for medications management;Help with stairs or ramp for entrance     Functional Status Assessment   Patient has had a recent decline in their functional status and demonstrates the ability to make significant improvements in function in a reasonable and predictable amount of time.     Equipment Recommendations   None recommended by OT     Recommendations for Other Services         Precautions/Restrictions   Precautions Precautions: Fall;Other (comment) Precaution/Restrictions Comments: watch HR, on Bipap Restrictions Weight Bearing Restrictions Per Provider Order: No     Mobility Bed  Mobility Overal bed mobility: Modified Independent             General bed mobility comments: extra time to sit EOB, used rails no physical assist required    Transfers Overall transfer level: Needs assistance Equipment used: Rolling walker (2 wheels) Transfers: Sit to/from Stand             General transfer comment: sit - stand from EOB, HR increased from 101-147, fluctating at rest from 101-125. O2 SATs 92-95% on 4L O2      Balance Overall balance assessment: Needs assistance Sitting-balance support: Feet supported Sitting balance-Leahy Scale: Good     Standing balance support: Bilateral upper extremity supported Standing balance-Leahy Scale: Poor                             ADL either performed or assessed with clinical judgement   ADL Overall ADL's : Needs assistance/impaired Eating/Feeding: Set up;Sitting   Grooming: Wash/dry hands;Wash/dry face;Contact guard assist;Sitting   Upper Body Bathing: Contact guard assist;Sitting   Lower Body Bathing: Total assistance   Upper Body Dressing : Contact guard assist;Sitting   Lower Body Dressing: Total assistance       Toileting- Clothing Manipulation and Hygiene: Maximal assistance;Sitting/lateral lean         General ADL Comments: total A with LB ADLs at baseline     Vision Baseline Vision/History: 1 Wears glasses Ability to See in Adequate Light: 0 Adequate Patient Visual Report: No change from baseline       Perception  Praxis         Pertinent Vitals/Pain Pain Assessment Pain Assessment: No/denies pain     Extremity/Trunk Assessment Upper Extremity Assessment Upper Extremity Assessment: Generalized weakness   Lower Extremity Assessment Lower Extremity Assessment: Defer to PT evaluation       Communication Communication Communication: No apparent difficulties;Other (comment) (on Bipap at eval)   Cognition Arousal: Alert Behavior During Therapy: WFL for tasks  assessed/performed Cognition: No apparent impairments                               Following commands: Intact       Cueing  General Comments   Cueing Techniques: Verbal cues      Exercises     Shoulder Instructions      Home Living Family/patient expects to be discharged to:: Skilled nursing facility                                 Additional Comments: pt is a LTC resident of [facility] SNF      Prior Functioning/Environment Prior Level of Function : Needs assist             Mobility Comments: pt reports that she was abot to SPT and was walking short distances to bathroom using RW with staff present ADLs Comments: Pt reports total A with LB ADLs and posteriro hygiene, set up with UB ADLS, grooming    OT Problem List: Cardiopulmonary status limiting activity;Decreased activity tolerance;Impaired balance (sitting and/or standing)   OT Treatment/Interventions: Self-care/ADL training;Modalities;Patient/family education;Therapeutic activities;DME and/or AE instruction;Energy conservation      OT Goals(Current goals can be found in the care plan section)   Acute Rehab OT Goals Patient Stated Goal: feel better OT Goal Formulation: With patient Time For Goal Achievement: 10/09/23 Potential to Achieve Goals: Good ADL Goals Pt Will Perform Grooming: with supervision;with set-up;sitting Pt Will Perform Upper Body Bathing: with supervision;with set-up;sitting Pt Will Perform Upper Body Dressing: with supervision;with set-up;sitting Pt Will Transfer to Toilet: with min assist;with contact guard assist;stand pivot transfer;bedside commode Pt Will Perform Toileting - Clothing Manipulation and hygiene: with max assist;with mod assist;sitting/lateral leans   OT Frequency:  Min 2X/week    Co-evaluation              AM-PAC OT 6 Clicks Daily Activity     Outcome Measure Help from another person eating meals?: None Help from another person  taking care of personal grooming?: A Little Help from another person toileting, which includes using toliet, bedpan, or urinal?: Total Help from another person bathing (including washing, rinsing, drying)?: A Lot Help from another person to put on and taking off regular upper body clothing?: A Little Help from another person to put on and taking off regular lower body clothing?: Total 6 Click Score: 14   End of Session Equipment Utilized During Treatment: Gait belt Nurse Communication: Mobility status;Other (comment) (increased HR)  Activity Tolerance: Patient limited by fatigue;Other (comment) (limited by signifcantly increased HR) Patient left: in bed;with call bell/phone within reach  OT Visit Diagnosis: Unsteadiness on feet (R26.81);Other abnormalities of gait and mobility (R26.89)                Time: 8875-8854 OT Time Calculation (min): 21 min Charges:  OT General Charges $OT Visit: 1 Visit OT Evaluation $OT Eval Moderate Complexity: 1 Mod    Jacques Aquas  Jeanette 09/25/2023, 1:55 PM

## 2023-09-25 NOTE — Plan of Care (Signed)

## 2023-09-26 ENCOUNTER — Inpatient Hospital Stay (HOSPITAL_COMMUNITY)

## 2023-09-26 ENCOUNTER — Inpatient Hospital Stay (HOSPITAL_COMMUNITY): Admission: RE | Admit: 2023-09-26 | Source: Ambulatory Visit

## 2023-09-26 DIAGNOSIS — N179 Acute kidney failure, unspecified: Secondary | ICD-10-CM

## 2023-09-26 DIAGNOSIS — Z6841 Body Mass Index (BMI) 40.0 and over, adult: Secondary | ICD-10-CM

## 2023-09-26 DIAGNOSIS — J9621 Acute and chronic respiratory failure with hypoxia: Principal | ICD-10-CM

## 2023-09-26 DIAGNOSIS — E66813 Obesity, class 3: Secondary | ICD-10-CM

## 2023-09-26 DIAGNOSIS — N1832 Chronic kidney disease, stage 3b: Secondary | ICD-10-CM

## 2023-09-26 LAB — BASIC METABOLIC PANEL WITH GFR
Anion gap: 9 (ref 5–15)
BUN: 54 mg/dL — ABNORMAL HIGH (ref 8–23)
CO2: 29 mmol/L (ref 22–32)
Calcium: 8.5 mg/dL — ABNORMAL LOW (ref 8.9–10.3)
Chloride: 96 mmol/L — ABNORMAL LOW (ref 98–111)
Creatinine, Ser: 2.58 mg/dL — ABNORMAL HIGH (ref 0.44–1.00)
GFR, Estimated: 18 mL/min — ABNORMAL LOW (ref >60)
Glucose, Bld: 109 mg/dL — ABNORMAL HIGH (ref 70–99)
Potassium: 5.4 mmol/L — ABNORMAL HIGH (ref 3.5–5.1)
Sodium: 134 mmol/L — ABNORMAL LOW (ref 135–145)

## 2023-09-26 LAB — CBC
HCT: 31.7 % — ABNORMAL LOW (ref 36.0–46.0)
Hemoglobin: 9.9 g/dL — ABNORMAL LOW (ref 12.0–15.0)
MCH: 31.9 pg (ref 26.0–34.0)
MCHC: 31.2 g/dL (ref 30.0–36.0)
MCV: 102.3 fL — ABNORMAL HIGH (ref 80.0–100.0)
Platelets: 162 K/uL (ref 150–400)
RBC: 3.1 MIL/uL — ABNORMAL LOW (ref 3.87–5.11)
RDW: 13.1 % (ref 11.5–15.5)
WBC: 11.6 K/uL — ABNORMAL HIGH (ref 4.0–10.5)
nRBC: 0 % (ref 0.0–0.2)

## 2023-09-26 LAB — POTASSIUM: Potassium: 5.4 mmol/L — ABNORMAL HIGH (ref 3.5–5.1)

## 2023-09-26 MED ORDER — GUAIFENESIN ER 600 MG PO TB12: 600.0000 mg | ORAL_TABLET | Freq: Two times a day (BID) | ORAL | Status: AC

## 2023-09-26 MED ORDER — APIXABAN 2.5 MG PO TABS: 5.0000 mg | ORAL_TABLET | Freq: Two times a day (BID) | ORAL | Status: AC

## 2023-09-26 MED ORDER — PREDNISONE 20 MG PO TABS: 40.0000 mg | ORAL_TABLET | Freq: Every day | ORAL | Status: AC

## 2023-09-26 MED ORDER — SODIUM ZIRCONIUM CYCLOSILICATE 10 G PO PACK
10.0000 g | PACK | Freq: Once | ORAL | Status: AC
  Administered 2023-09-26: 10 g via ORAL
  Filled 2023-09-26: qty 1

## 2023-09-26 MED ORDER — UMECLIDINIUM-VILANTEROL 62.5-25 MCG/ACT IN AEPB: 1.0000 | INHALATION_SPRAY | Freq: Every day | RESPIRATORY_TRACT | Status: AC

## 2023-09-26 NOTE — Plan of Care (Signed)

## 2023-09-26 NOTE — Discharge Instructions (Addendum)
 You were hospitalized because you were having trouble breathing. We were able to give you some breathing treatments, started you on a medication to help reduce inflammation in your lungs, and gave you some antibiotics while you were in the hospital. When you came into the hospital we had to put you on the BiPAP machine to help your lungs breathe. We then were able to slowly take you off of that machine and were able to get you down to the amount of oxygen  you use at home. We were also able to start you on inhaler that you will use everyday when you leave the hospital to help your breathing. You should also use your BiPAP/CPAP machine when you are at home every night.Thank you for allowing us  to be part of your care.   Please follow up with your nephrologist( kidney doctor) after you leave the hospital.    Please note these changes made to your medications:   *Please START taking:  Guaifenesin ( mucinex ) 600 mg two times a day for 7 days Prednisone  20 mg take 40 mg (2 x 20 mg tablets) for breakfast for 2 days  Anoro Ellipta  inhale 1 puff into lungs daily   *Please change the way you take: Apixaban  2.5 mg two times a day   *Please STOP taking until you talk to your doctor:  Metoprolol  succinate 25 mg  Torsemide 20 mg tablet

## 2023-09-26 NOTE — Discharge Summary (Cosign Needed)
 Name: Jean Stewart MRN: [id number] DOB: 1945/07/18 78 y.o. PCP: Patient, No Pcp Per  Date of Admission: 09/24/2023  4:39 AM Date of Discharge: 09/26/2023 Attending Physician: Dr. Mliss Pouch  Discharge Diagnosis: 1. Principal Problem:   Acute on chronic hypoxic respiratory failure (HCC) Active Problems:   Class 3 obesity with alveolar hypoventilation, serious comorbidity, and body mass index (BMI) of 60.0 to 69.9 in adult [facility])   Acute kidney injury superimposed on stage 3b chronic kidney disease (HCC)   Chronic respiratory failure requiring treatment with nocturnal BPAP by mask [facility])    Discharge Medications: Allergies as of 09/26/2023   No Known Allergies      Medication List     PAUSE taking these medications    metoprolol  succinate 25 MG 24 hr tablet Wait to take this until your doctor or other care provider tells you to start again. Commonly known as: TOPROL -XL Take 12.5 mg by mouth daily.   torsemide 20 MG tablet Wait to take this until your doctor or other care provider tells you to start again. Commonly known as: DEMADEX Take 20 mg by mouth 2 (two) times daily.       STOP taking these medications    cefTRIAXone  1 g injection Commonly known as: ROCEPHIN        TAKE these medications    acetaminophen  325 MG tablet Commonly known as: TYLENOL  Take 650 mg by mouth every 4 (four) hours as needed for fever.   apixaban  2.5 MG Tabs tablet Commonly known as: ELIQUIS  Take 2 tablets (5 mg total) by mouth 2 (two) times daily. What changed: medication strength   D3 25 MCG (1000 UT) capsule Generic drug: Cholecalciferol  Take 1,000 Units by mouth daily.   gabapentin  100 MG capsule Commonly known as: NEURONTIN  Take 100 mg by mouth daily.   guaiFENesin  600 MG 12 hr tablet Commonly known as: MUCINEX  Take 1 tablet (600 mg total) by mouth 2 (two) times daily for 7 days.   ipratropium-albuterol  0.5-2.5 (3) MG/3ML Soln Commonly known as:  DUONEB Take 3 mLs by nebulization in the morning, at noon, and at bedtime.   latanoprost  0.005 % ophthalmic solution Commonly known as: XALATAN  Place 1 drop into both eyes at bedtime.   midodrine  2.5 MG tablet Commonly known as: PROAMATINE  Take 2.5 mg by mouth 3 (three) times daily.   multivitamins with iron Tabs tablet Take 1 tablet by mouth daily.   predniSONE  20 MG tablet Commonly known as: DELTASONE  Take 2 tablets (40 mg total) by mouth daily with breakfast for 2 days.   sennosides-docusate sodium  8.6-50 MG tablet Commonly known as: SENOKOT-S Take 1 tablet by mouth every 12 (twelve) hours.   simethicone 80 MG chewable tablet Commonly known as: MYLICON Chew 80 mg by mouth daily. For 3 days   umeclidinium-vilanterol 62.5-25 MCG/ACT Aepb Commonly known as: ANORO ELLIPTA  Inhale 1 puff into the lungs daily.        Disposition and follow-up:   Ms.Jean Stewart was discharged from [facility] in Stable condition.  At the hospital follow up visit please address:  1.  Skeletal survey: Patient was supposed to be getting this done outpatient as part of multiple myeloma work up. We were able to order this while she was in the hospital but imaging was limited due to body habitus  BiPAP/CPAP mask- Patient reports that she has not been using this machine at home and needs help putting it on from nursing staff. Patient reports some discomfort due  to history of broken nose with current mask and would like to be able to use another one. It is important that she has compliance with this machine at night, as not using it is what is leading to her hypoxic and hypercarbic respiratory failure.   Chronic hypoxic respiratory failure with hypercarbia- LABA/LAMA added to regimen. Please ensure that patient is using   Concern for multiple myeloma- Patient has been getting worked up outpatient for this. She was found to have kappa light chains in urine and has had multiple  tests ordered to rule out multiple myeloma. M spike noted on past labs. Patient has also been having worsening kidney function. Skeletal survey done at the hospital but imaging was limited. Please follow up this imaging and continued work up.   AKI on CKD IIIb - Patient should follow up with nephrology for worsening kidney function. We held off on her diuretics due to this worsening kidney function. Could be do monoclonal gammopathy of renal significance.   2.  Labs / imaging needed at time of follow-up: BMP for potassium follow up and kidney function   3.  Pending labs/ test needing follow-up: Skeletal survey   Follow-up Appointments:  Follow-up Information     Care, [facility] Follow up.   Specialty: Skilled Nursing Facility Why: Make sure to review hospital stay and medications with your primary care doctor. Contact information: [address] [place] [address] [phone]         Burton, Lacie K, NP. Go to.   Specialty: Nurse Practitioner Why: Appointment on 10/05/2023 10:30 AM Contact information: [address] [place] [address] [phone]                  Hospital Course by problem list: Jean Stewart is a 78 y.o. female with PMH of HFpEF 55 to 60%, Atrial fibrillation, hypertension presented with complaints of shortness of breath and admitted for acute on chronic hypoxic respiratory failure with hypercarbia.    # Acute on Chronic Hypoxic and hypercarbic Respiratory Failure #COPD exacerbation Unsure if patient has had formal diagnosis of COPD. Obesity hypoventilation syndrome likely is playing a part in this too.  Patient reports that she does not use CPAP/ BiPAP machine at facility because she needs help putting it on and does not like the facemask.  Patient chronically on 2L [place]. VBG was initially normal pH with pCO2 of 79.5. Patient was on 4L of O2 initially when she came into the ED. Patient remained afebrile while in the ED and does not  show leukocytosis.Patient did endorse a cough with some sputum production prior to coming to the hospital.   Patient did not show signs of major volume overload on exam, low suspicion for heart failure exacerbation.  Due to history of COPD gave antibiotics for COPD exacerbation. We started patient on azithromycin  500 mg for 3 days, prednisone  40 mg for 4 days, DuoNebs, mucinex . Physical therapy and occupation therapy was consulted. We weaned oxygen  down with a goal of 88-92%. Patient was able to go back to her 2L of oxygen  at the time of discharge. Patient was also started on LAMA/LABA at time of discharge for COPD exacerbation.   #HFpEF 55 to 60%  Patient did not sound volume overloaded on exam. Although she did endorse lower extremity swelling, patient did have just trace edema in lower extremities. Patient received Lasix  40 mg in the ED. She takes torsemide at home, although she says that she takes it only once a  day when it is prescribed for twice a day. There was a low suspicion for heart failure exacerbation as patient did not sound volume overloaded on lung exam and lower extremities had trace edema that did improve. Patient was only given dose of IV lasix  40 mg in the ED but no other diuresis was given during admission and patient's breathing got better. We also monitored intake, output and daily weights during hospitalizations.  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  In May patient's creatinine was 1.67 with a GFR of 31.  This is consistent with CKD stage IIIb.  Patient has been getting worked up for kappa light chains found in urine outpatient. She had abnormal kappa/lambda light chain ratio. She was being worked up outpatient to rule out multiple myeloma.  Dating back to 2020, patient has had creatinine in the 1.5-2s range on and off. Creatinine at time of discharge was 2.58. Patient  will need to follow up outpatient with nephrology for continued management.   #A-fib on Eliquis   Continued patient on home eliquis , but held metoprolol  due to hypotension. Patient's rates were relatively rate controlled during admission, although she continued to stay in Afib throughout admission. Can assess for need of this medication after hospitalization.   #Hypotension Patient is on home midodrine  2.5 mg 3 times daily.  Continued this in the hospital setting in light of patient having softer blood pressures.     #mild hyperkalemia - Potassium was mildly elevated at 5.4 at time of discharge. Patient was given 10 g lokelma  and will need BMP outpatient to monitor potassium.   Stable chronic medical conditions: # Glaucoma Will continue home lantoprost eyedrops during admission  Discharge subjective:  Patient reported that she feels that her breathing is a lot better since when she came into the hospital. We did talk to her about getting her outpatient skeletal survey done inside the hospital since she is already here. She also did talk about wanting to use a different mask for CPAP machine at facility due to history of broken nose. Also informed patient that she should advocate for herself, and ask nurses to help her put this machine on at night. No new complaints.   Discharge Exam:   BP 128/76 (BP Location: Right Wrist)   Pulse 90   Temp 98.3 F (36.8 C) (Oral)   Resp 19   Ht 5' 5 (1.651 m)   Wt (!) 183.1 kg   SpO2 100%   BMI 67.18 kg/m  Discharge exam:  Cardiovascular:     Rate and Rhythm: Normal rate. Rhythm irregular.  Pulmonary:     Effort: Pulmonary effort is normal.     Breath sounds: Some diminished breath sounds at the bases of lungs posterior. No wheezes heard on auscultation which is improvement from admission.     Comments: Patient was weaned off of nasal cannula during visit and was saturating at 98% at rest.  Musculoskeletal:     Right lower leg: No edema.      Left lower leg: No edema.  Neurological:     Mental Status: She is alert.   Pertinent Labs, Studies, and Procedures:     Latest Ref Rng & Units 09/26/2023    1:45 AM 09/25/2023    2:20 AM 09/24/2023    7:43 AM  CBC  WBC 4.0 - 10.5 K/uL 11.6  13.3    Hemoglobin 12.0 -  15.0 g/dL 9.9  89.5  88.0   Hematocrit 36.0 - 46.0 % 31.7  33.9  35.0   Platelets 150 - 400 K/uL 162  160         Latest Ref Rng & Units 09/26/2023    1:42 PM 09/26/2023    1:45 AM 09/25/2023    2:20 AM  CMP  Glucose 70 - 99 mg/dL  890  842   BUN 8 - 23 mg/dL  54  49   Creatinine 9.55 - 1.00 mg/dL  7.41  7.28   Sodium 864 - 145 mmol/L  134  138   Potassium 3.5 - 5.1 mmol/L 5.4  5.4  5.1   Chloride 98 - 111 mmol/L  96  96   CO2 22 - 32 mmol/L  29  29   Calcium 8.9 - 10.3 mg/dL  8.5  8.4     DG Chest Portable 1 View Result Date: 09/24/2023 CLINICAL DATA:  Shortness of breath. EXAM: PORTABLE CHEST 1 VIEW COMPARISON:  11/21/2020 FINDINGS: Stable cardiac enlargement. Aortic atherosclerosis. Mild thickening of the minor fissure. Increased interstitial markings. No airspace opacities. Visualized osseous structures appear intact. IMPRESSION: Cardiac enlargement and increased interstitial markings compatible with mild congestive heart failure. Electronically Signed   By: Waddell Calk M.D.   On: 09/24/2023 05:37     Discharge Instructions: Discharge Instructions     Diet - low sodium heart healthy   Complete by: As directed    Increase activity slowly   Complete by: As directed        Signed: Trudy Mliss Dragon, MD 09/28/2023, 12:13 PM

## 2023-09-26 NOTE — TOC Transition Note (Signed)
 Transition of Care [facility]) - Discharge Note   Patient Details  Name: Jean Stewart MRN: [id number] Date of Birth: 1945-10-29  [facility]) CM/SW Contact:  Lauraine FORBES Saa, LCSW Phone Number: 09/26/2023, 3:36 PM   Clinical Narrative:     Patient will DC to: [facility] SNF LTC Anticipated DC date: 07/22/205 Family notified: N/A Transport by: [place]   Per MD patient ready for DC to [facility] SNF LTC. RN to call report prior to discharge [phone]). RN, patient, and facility notified of DC. Patient declined CSW offer of informing patient's family of patient discharge. Discharge Summary and FL2 sent to facility. DC packet on chart. Ambulance transport requested for patient at 15:34.   CSW will sign off for now as social work intervention is no longer needed. Please consult us  again if new needs arise.    Final next level of care: Skilled Nursing Facility Barriers to Discharge: Barriers Resolved   Patient Goals and CMS Choice            Discharge Placement              Patient chooses bed at: [facility] Patient to be transferred to facility by: [facility]   Patient and family notified of of transfer: 09/26/23  Discharge Plan and Services Additional resources added to the After Visit Summary for   In-house Referral: Clinical Social Work   Post Acute Care Choice: Skilled Nursing Facility                               Social Drivers of Health (SDOH) Interventions SDOH Screenings   Food Insecurity: No Food Insecurity (09/24/2023)  Housing: Low Risk  (09/24/2023)  Transportation Needs: No Transportation Needs (09/24/2023)  Utilities: Not At Risk (09/24/2023)  Depression (PHQ2-9): Low Risk  (09/15/2023)  Social Connections: Unknown (09/24/2023)  Tobacco Use: Low Risk  (09/24/2023)     Readmission Risk Interventions     No data to display

## 2023-09-26 NOTE — TOC Progression Note (Signed)
 Transition of Care [facility]) - Progression Note    Patient Details  Name: Jean Stewart MRN: [id number] Date of Birth: 1945-09-08  Transition of Care [facility]) CM/SW Contact  Lauraine FORBES Saa, LCSW Phone Number: 09/26/2023, 10:15 AM  Clinical Narrative:     10:15 AM CSW sent FL2 to [facility] SNF LTC.  Expected Discharge Plan: Long Term Nursing Home Barriers to Discharge: Continued Medical Work up  Expected Discharge Plan and Services In-house Referral: Clinical Social Work   Post Acute Care Choice: Skilled Nursing Facility Living arrangements for the past 2 months: Skilled Nursing Facility                                       Social Determinants of Health (SDOH) Interventions SDOH Screenings   Food Insecurity: No Food Insecurity (09/24/2023)  Housing: Low Risk  (09/24/2023)  Transportation Needs: No Transportation Needs (09/24/2023)  Utilities: Not At Risk (09/24/2023)  Depression (PHQ2-9): Low Risk  (09/15/2023)  Social Connections: Unknown (09/24/2023)  Tobacco Use: Low Risk  (09/24/2023)    Readmission Risk Interventions     No data to display

## 2023-09-26 NOTE — [place] FL2 (Signed)
 [place]  MEDICAID FL2 LEVEL OF CARE FORM     IDENTIFICATION  Patient Name: Jean Stewart Birthdate: 04-Mar-1946 Sex: female Admission Date (Current Location): 09/24/2023  [facility] and [place] Number:  Producer, television/film/video and Address:  [facility] [place], [address], [place], [place] [address]      Provider Number: [id number]  Attending Physician Name and Address:  Trudy Mliss Dragon, MD  Relative Name and Phone Number:  Nayzeth Altman; Daughter; [phone]    Current Level of Care: Hospital Recommended Level of Care: Skilled Nursing Facility Prior Approval Number:    Date Approved/Denied:   PASRR Number: [id number]  Discharge Plan: SNF    Current Diagnoses: Patient Active Problem List   Diagnosis Date Noted   Acute on chronic hypoxic respiratory failure (HCC) 09/24/2023   Elevated serum kappa light chain 09/15/2023   Hyperkalemia    Type III open trimalleolar fracture of left ankle 01/22/2019   Open fracture dislocation of ankle 12/04/2018   Type III open trimalleolar fracture of ankle 12/04/2018   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HCC) 12/04/2018   Chronic anticoagulation 12/04/2018   Anemia of chronic disease 12/04/2018   Pressure injury of skin 05/12/2018   Atrial fibrillation with RVR (HCC) 05/11/2018   Obesity, Class III, BMI 40-49.9 (morbid obesity) 05/11/2018   Essential hypertension 05/11/2018   CKD (chronic kidney disease) stage 3, GFR 30-59 ml/min (HCC) 05/11/2018   Permanent atrial fibrillation (HCC) 05/11/2018    Orientation RESPIRATION BLADDER Height & Weight     Time, Situation, Place, Self  O2 (2L nasal cannula) Incontinent Weight: (!) 403 lb 11.2 oz (183.1 kg) Height:  5' 5 (165.1 cm)  BEHAVIORAL SYMPTOMS/MOOD NEUROLOGICAL BOWEL NUTRITION STATUS      Continent Diet (Please see discharge summary)  AMBULATORY STATUS COMMUNICATION OF NEEDS Skin   Limited Assist Verbally Normal                        Personal Care Assistance Level of Assistance  Bathing, Feeding, Dressing Bathing Assistance: Limited assistance Feeding assistance: Limited assistance Dressing Assistance: Limited assistance     Functional Limitations Info             SPECIAL CARE FACTORS FREQUENCY  PT (By licensed PT), OT (By licensed OT)     PT Frequency: 5x OT Frequency: 5x            Contractures Contractures Info: Not present    Additional Factors Info  Code Status, Allergies Code Status Info: DNR-LIMITED -Do Not Intubate/DNI Allergies Info: NKA           Current Medications (09/26/2023):  This is the current hospital active medication list Current Facility-Administered Medications  Medication Dose Route Frequency Provider Last Rate Last Admin   apixaban  (ELIQUIS ) tablet 2.5 mg  2.5 mg Oral BID Zheng, Michael, DO   2.5 mg at 09/26/23 0933   gabapentin  (NEURONTIN ) capsule 100 mg  100 mg Oral QHS Zheng, Michael, DO   100 mg at 09/25/23 2140   guaiFENesin  (MUCINEX ) 12 hr tablet 600 mg  600 mg Oral BID Zheng, Michael, DO   600 mg at 09/26/23 0933   hydrOXYzine  (ATARAX ) tablet 25 mg  25 mg Oral TID PRN Syeda, Raeeha, DO   25 mg at 09/25/23 0009   ipratropium-albuterol  (DUONEB) 0.5-2.5 (3) MG/3ML nebulizer solution 3 mL  3 mL Nebulization Q4H PRN Elicia Sharper, DO       latanoprost  (XALATAN ) 0.005 % ophthalmic  solution 1 drop  1 drop Both Eyes QHS Zheng, Michael, DO   1 drop at 09/25/23 2142   midodrine  (PROAMATINE ) tablet 2.5 mg  2.5 mg Oral TID WC Zheng, Michael, DO   2.5 mg at 09/26/23 0933   mupirocin  ointment (BACTROBAN ) 2 % 1 Application  1 Application Nasal BID Machen, Julie, MD   1 Application at 09/26/23 0933   predniSONE  (DELTASONE ) tablet 40 mg  40 mg Oral Q breakfast Zheng, Michael, DO   40 mg at 09/26/23 9066   senna-docusate (Senokot-S) tablet 1 tablet  1 tablet Oral QHS PRN Zheng, Michael, DO   1 tablet at 09/24/23 2152   umeclidinium-vilanterol (ANORO ELLIPTA ) 62.5-25 MCG/ACT 1  puff  1 puff Inhalation Daily Trudy Mliss Dragon, MD   1 puff at 09/26/23 9265   white petrolatum  (VASELINE) gel   Topical PRN Machen, Julie, MD   0.2 Application at 09/25/23 0010     Discharge Medications: Please see discharge summary for a list of discharge medications.  Relevant Imaging Results:  Relevant Lab Results:   Additional Information ss#[id number]  Lauraine FORBES Saa, LCSW

## 2023-09-26 NOTE — Progress Notes (Signed)
 Report called to receiving facility. No further questions. Awaiting transport.

## 2023-09-26 NOTE — Progress Notes (Signed)
 HD#1 SUBJECTIVE:  Patient Summary: Jean Stewart is a 78 y.o. female with PMH of HFpEF 55 to 60%, Atrial fibrillation, hypertension presented with complaints of shortness of breath and admitted for acute on chronic hypoxic respiratory failure with hypercarbia.   Overnight Events: No overnight events   Interim History: She notes that she is feeling much, much, much better than when she was initially admitted. She is less short of breath as well. No other complaints  OBJECTIVE:  Vital Signs: Vitals:   09/26/23 0000 09/26/23 0200 09/26/23 0359 09/26/23 0400  BP:   (!) 133/95 (!) 133/95  Pulse: 86 83 79 81  Resp: 16 20 18 20   Temp:   97.6 F (36.4 C)   TempSrc:   Axillary   SpO2: 100% 100% 100% 100%  Weight:   (!) 183.1 kg   Height:       Supplemental O2: Nasal Cannula SpO2: 100 % O2 Flow Rate (L/min): 2 L/min FiO2 (%): 40 %  Filed Weights   09/24/23 0444 09/25/23 0500 09/26/23 0359  Weight: (!) 195.7 kg (!) 181.4 kg (!) 183.1 kg     Intake/Output Summary (Last 24 hours) at 09/26/2023 0611 Last data filed at 09/26/2023 0400 Gross per 24 hour  Intake 1022 ml  Output 1700 ml  Net -678 ml   Net IO Since Admission: -678 mL [09/26/23 0611]  Physical Exam: Cardiovascular:     Rate and Rhythm: Normal rate. Rhythm irregular.  Pulmonary:     Effort: Pulmonary effort is normal.     Breath sounds: Some diminished breath sounds at the bases of lungs posterior. No wheezes heard on auscultation which is improvement from admission.     Comments: Patient was weaned off of nasal cannula during visit and was saturating at 98% at rest.  Musculoskeletal:     Right lower leg: No edema.     Left lower leg: No edema.  Neurological:     Mental Status: She is alert.   Patient Lines/Drains/Airways Status     Active Line/Drains/Airways     Name Placement date Placement time Site Days   Peripheral IV 09/24/23 20 G Left Antecubital 09/24/23  0446  Antecubital  1   Incision (Closed)  12/03/18 Ankle Left 12/03/18  1656  -- 1757   Incision (Closed) 01/25/19 Leg Left 01/25/19  0752  -- 1704            Pertinent labs and imaging:      Latest Ref Rng & Units 09/26/2023    1:45 AM 09/25/2023    2:20 AM 09/24/2023    7:43 AM  CBC  WBC 4.0 - 10.5 K/uL 11.6  13.3    Hemoglobin 12.0 - 15.0 g/dL 9.9  89.5  88.0   Hematocrit 36.0 - 46.0 % 31.7  33.9  35.0   Platelets 150 - 400 K/uL 162  160         Latest Ref Rng & Units 09/26/2023    1:45 AM 09/25/2023    2:20 AM 09/24/2023    7:43 AM  CMP  Glucose 70 - 99 mg/dL 890  842    BUN 8 - 23 mg/dL 54  49    Creatinine 9.55 - 1.00 mg/dL 7.41  7.28    Sodium 864 - 145 mmol/L 134  138  139   Potassium 3.5 - 5.1 mmol/L 5.4  5.1  5.4   Chloride 98 - 111 mmol/L 96  96    CO2 22 - 32 mmol/L  29  29    Calcium 8.9 - 10.3 mg/dL 8.5  8.4      No results found.  ASSESSMENT/PLAN:  Assessment: Principal Problem:   Acute on chronic hypoxic respiratory failure (HCC)   Plan: Jean Stewart is a 78 y.o. female with PMH of HFpEF 55 to 60%, Atrial fibrillation, hypertension presented with complaints of shortness of breath and admitted for acute on chronic hypoxic respiratory failure with hypercarbia.    # Acute on Chronic Hypoxic and hypercarbic Respiratory Failure #COPD exacerbation - Likely secondary to obesity hypoventilation syndrome.  Patient reports that she does not use CPAP/ BiPAP machine at facility  .  Patient chronically on 2L [place]. Patient reports that her breathing has been doing better since coming to the hospital. She was on bipap at bedside this morning but was on oxygen  earlier this morning and doing well.  -Azithromycin  500 mg for 3 days( day 3) -DuoNebs every 4 hours for 6 doses - Prednisone  40 mg for 4 days,(day 3)  -Mucinex  2 times daily -started LABA/LAMA for COPD  -incentive spirometer - Wean off of Bipap to home O2 , oxygen  saturation goal of 88-92%  -PT/OT consulted. Patient at baseline usually only  ambulates to the bathroom and back. Hope is to get her up to this baseline. PT recommends SNF which patient is already at    #HFpEF 55 to 60% Patient received Lasix  40 mg in the ED.  She takes torsemide at home, although she says that she takes it only once a day when it is prescribed for twice a day. - Low suspicion for heart failure exacerbation at this time. After volume assessment today, do not believe that further diuretics are warranted at this time. 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  In May patient's creatinine was 1.67 with a GFR of 31.  This is consistent with CKD stage IIIb.  -Patient has been getting worked up for kappa light chains found in urine outpatient. She had abnormal kappa/lambda light chain ratio. She was being worked up outpatient to rule out multiple myeloma. Was able to get skeletal survey in the hospital but limited due to body habitus.   Dating back to 2020, patient has had creatinine in the 1.5-2s range on and off.  - No diuresis today and creatinine improved   -Trend renal function with BMP. Will repeat potassium in the afternoon due to mild hyperkalemia  -Strict I's and O's   #A-fib on Eliquis  -Continue home Eliquis  -Patient is in A-fib per telemetry -Rates seem to be better controlled today. Patient's blood pressures have been on the softer side and therefore we have held this medication.    #Hypotension Patient is on home midodrine  2.5 mg 3 times daily.  Continue this in the hospital setting Has had softer blood pressures during the course of today.  Will continue to monitor     # Glaucoma Will continue home lantoprost eyedrops during admission Best Practice: Diet: low sodium  IVF: none VTE: apixaban  (ELIQUIS ) tablet 2.5 mg Start: 09/24/23 2200 Code:  Full  Disposition planning: Therapy Recs:  Family Contact: attempted to notify  DISPO: Anticipated discharge pending improvement in respiratory status   Signature:  Trek Kimball D'Mello [facility]  6:11 AM, 09/26/2023  On Call pager [phone]

## 2023-09-28 DIAGNOSIS — J9610 Chronic respiratory failure, unspecified whether with hypoxia or hypercapnia: Secondary | ICD-10-CM

## 2023-10-04 NOTE — Progress Notes (Unsigned)
 [facility]   Telephone:(336) [phone] Fax:(336) [phone]    Patient Care Team: Patient, No Pcp Per as PCP - General (General Practice) Maranda Leim DEL, MD as PCP - Cardiology (Cardiology) Macel Jayson PARAS, MD as Consulting Physician (Internal Medicine)   CHIEF COMPLAINT:   Oncology History   No history exists.     CURRENT THERAPY:   INTERVAL HISTORY   ROS   Past Medical History:  Diagnosis Date   A-fib (HCC)    Acute encephalopathy    Acute on chronic respiratory failure with hypercapnia (HCC)    Acute on chronic respiratory failure with hypoxia and hypercapnia (HCC) 11/17/2020   Acute respiratory failure with hypoxia and hypercapnia (HCC) 05/11/2018   Arthritis    COPD (chronic obstructive pulmonary disease) (HCC)    Elevated troponin 05/11/2018   Hypertension    Hypoxia 11/17/2020   IBS (irritable bowel syndrome)    Liver failure (HCC)    Morbid obesity (HCC)    Neuropathy    Peripheral vascular disease (HCC)    blood clots in legs   Physical deconditioning    Pre-diabetes    Suspected sleep apnea      Past Surgical History:  Procedure Laterality Date   EXTERNAL FIXATION LEG Left 12/03/2018   Procedure: EXTERNAL FIXATION LEG;  Surgeon: Kendal Franky SQUIBB, MD;  Location: [facility] OR;  Service: Orthopedics;  Laterality: Left;   EXTERNAL FIXATION REMOVAL Left 01/25/2019   Procedure: REMOVAL EXTERNAL FIXATION LEG;  Surgeon: Kendal Franky SQUIBB, MD;  Location: [facility] OR;  Service: Orthopedics;  Laterality: Left;   I & D EXTREMITY Left 12/03/2018   Procedure: IRRIGATION AND DEBRIDEMENT EXTREMITY;  Surgeon: Kendal Franky SQUIBB, MD;  Location: [facility] OR;  Service: Orthopedics;  Laterality: Left;   ORIF ANKLE FRACTURE Left 12/03/2018   Procedure: OPEN REDUCTION INTERNAL FIXATION (ORIF) ANKLE FRACTURE;  Surgeon: Kendal Franky SQUIBB, MD;  Location: [facility] OR;  Service: Orthopedics;  Laterality: Left;     Outpatient Encounter Medications as of 10/05/2023  Medication Sig Note    acetaminophen  (TYLENOL ) 325 MG tablet Take 650 mg by mouth every 4 (four) hours as needed for fever. (Patient not taking: Reported on 09/24/2023) 09/24/2023: Not listed on the [facility]   apixaban  (ELIQUIS ) 2.5 MG TABS tablet Take 2 tablets (5 mg total) by mouth 2 (two) times daily.    Cholecalciferol  (D3) 25 MCG (1000 UT) capsule Take 1,000 Units by mouth daily.    gabapentin  (NEURONTIN ) 100 MG capsule Take 100 mg by mouth daily.    ipratropium-albuterol  (DUONEB) 0.5-2.5 (3) MG/3ML SOLN Take 3 mLs by nebulization in the morning, at noon, and at bedtime.    latanoprost  (XALATAN ) 0.005 % ophthalmic solution Place 1 drop into both eyes at bedtime.    [Paused] metoprolol  succinate (TOPROL -XL) 25 MG 24 hr tablet Take 12.5 mg by mouth daily.    midodrine  (PROAMATINE ) 2.5 MG tablet Take 2.5 mg by mouth 3 (three) times daily.    Multiple Vitamins-Iron (MULTIVITAMINS WITH IRON) TABS tablet Take 1 tablet by mouth daily.    sennosides-docusate sodium  (SENOKOT-S) 8.6-50 MG tablet Take 1 tablet by mouth every 12 (twelve) hours.    simethicone (MYLICON) 80 MG chewable tablet Chew 80 mg by mouth daily. For 3 days    [Paused] torsemide (DEMADEX) 20 MG tablet Take 20 mg by mouth 2 (two) times daily.    umeclidinium-vilanterol (ANORO ELLIPTA ) 62.5-25 MCG/ACT AEPB Inhale 1 puff into the lungs daily.    No facility-administered  encounter medications on file as of 10/05/2023.     There were no vitals filed for this visit. There is no height or weight on file to calculate BMI.   ECOG PERFORMANCE STATUS: {CHL ONC ECOG PS:[id number]}  PHYSICAL EXAM GENERAL:alert, no distress and comfortable SKIN: no rash  EYES: sclera clear NECK: without mass LYMPH:  no palpable cervical or supraclavicular lymphadenopathy  LUNGS: clear with normal breathing effort HEART: regular rate & rhythm, no lower extremity edema ABDOMEN: abdomen soft, non-tender and normal bowel sounds NEURO: alert & oriented x 3 with fluent speech, no focal  motor/sensory deficits Breast exam:  PAC without erythema    CBC    Latest Ref Rng & Units 09/26/2023    1:45 AM 09/25/2023    2:20 AM 09/24/2023    7:43 AM  CBC  WBC 4.0 - 10.5 K/uL 11.6  13.3    Hemoglobin 12.0 - 15.0 g/dL 9.9  89.5  88.0   Hematocrit 36.0 - 46.0 % 31.7  33.9  35.0   Platelets 150 - 400 K/uL 162  160        CMP     Latest Ref Rng & Units 09/26/2023    1:42 PM 09/26/2023    1:45 AM 09/25/2023    2:20 AM  CMP  Glucose 70 - 99 mg/dL  890  842   BUN 8 - 23 mg/dL  54  49   Creatinine 9.55 - 1.00 mg/dL  7.41  7.28   Sodium 864 - 145 mmol/L  134  138   Potassium 3.5 - 5.1 mmol/L 5.4  5.4  5.1   Chloride 98 - 111 mmol/L  96  96   CO2 22 - 32 mmol/L  29  29   Calcium 8.9 - 10.3 mg/dL  8.5  8.4       ASSESSMENT & PLAN:  PLAN:  No orders of the defined types were placed in this encounter.     All questions were answered. The patient knows to call the clinic with any problems, questions or concerns. No barriers to learning were detected. I spent *** counseling the patient face to face. The total time spent in the appointment was *** and more than 50% was on counseling, review of test results, and coordination of care.   Montrae Braithwaite K Jovita Persing, NP 10/04/2023 10:51 PM

## 2023-10-05 ENCOUNTER — Other Ambulatory Visit: Payer: Self-pay

## 2023-10-05 ENCOUNTER — Inpatient Hospital Stay (HOSPITAL_BASED_OUTPATIENT_CLINIC_OR_DEPARTMENT_OTHER): Admitting: Hematology

## 2023-10-05 ENCOUNTER — Inpatient Hospital Stay

## 2023-10-05 VITALS — BP 136/78 | HR 97 | Temp 97.8°F | Resp 17 | Wt >= 6400 oz

## 2023-10-05 DIAGNOSIS — D638 Anemia in other chronic diseases classified elsewhere: Secondary | ICD-10-CM

## 2023-10-05 DIAGNOSIS — G629 Polyneuropathy, unspecified: Secondary | ICD-10-CM | POA: Diagnosis not present

## 2023-10-05 DIAGNOSIS — R768 Other specified abnormal immunological findings in serum: Secondary | ICD-10-CM | POA: Diagnosis present

## 2023-10-05 DIAGNOSIS — N183 Chronic kidney disease, stage 3 unspecified: Secondary | ICD-10-CM | POA: Diagnosis not present

## 2023-10-05 DIAGNOSIS — D472 Monoclonal gammopathy: Secondary | ICD-10-CM | POA: Diagnosis not present

## 2023-10-05 DIAGNOSIS — D8989 Other specified disorders involving the immune mechanism, not elsewhere classified: Secondary | ICD-10-CM

## 2023-10-05 DIAGNOSIS — Z79899 Other long term (current) drug therapy: Secondary | ICD-10-CM | POA: Diagnosis not present

## 2023-10-05 LAB — CMP (CANCER CENTER ONLY)
ALT: 14 U/L (ref 0–44)
AST: 16 U/L (ref 15–41)
Albumin: 3.6 g/dL (ref 3.5–5.0)
Alkaline Phosphatase: 107 U/L (ref 38–126)
Anion gap: 7 (ref 5–15)
BUN: 40 mg/dL — ABNORMAL HIGH (ref 8–23)
CO2: 34 mmol/L — ABNORMAL HIGH (ref 22–32)
Calcium: 8.8 mg/dL — ABNORMAL LOW (ref 8.9–10.3)
Chloride: 100 mmol/L (ref 98–111)
Creatinine: 1.9 mg/dL — ABNORMAL HIGH (ref 0.44–1.00)
GFR, Estimated: 26 mL/min — ABNORMAL LOW (ref >60)
Glucose, Bld: 98 mg/dL (ref 70–99)
Potassium: 4.4 mmol/L (ref 3.5–5.1)
Sodium: 141 mmol/L (ref 135–145)
Total Bilirubin: 0.8 mg/dL (ref 0.0–1.2)
Total Protein: 7.1 g/dL (ref 6.5–8.1)

## 2023-10-05 LAB — CBC WITH DIFFERENTIAL (CANCER CENTER ONLY)
Abs Immature Granulocytes: 0.05 K/uL (ref 0.00–0.07)
Basophils Absolute: 0 K/uL (ref 0.0–0.1)
Basophils Relative: 0 %
Eosinophils Absolute: 0.2 K/uL (ref 0.0–0.5)
Eosinophils Relative: 3 %
HCT: 33.7 % — ABNORMAL LOW (ref 36.0–46.0)
Hemoglobin: 10.8 g/dL — ABNORMAL LOW (ref 12.0–15.0)
Immature Granulocytes: 1 %
Lymphocytes Relative: 16 %
Lymphs Abs: 1.2 K/uL (ref 0.7–4.0)
MCH: 31.7 pg (ref 26.0–34.0)
MCHC: 32 g/dL (ref 30.0–36.0)
MCV: 98.8 fL (ref 80.0–100.0)
Monocytes Absolute: 0.6 K/uL (ref 0.1–1.0)
Monocytes Relative: 8 %
Neutro Abs: 5.4 K/uL (ref 1.7–7.7)
Neutrophils Relative %: 72 %
Platelet Count: 168 K/uL (ref 150–400)
RBC: 3.41 MIL/uL — ABNORMAL LOW (ref 3.87–5.11)
RDW: 13.8 % (ref 11.5–15.5)
WBC Count: 7.5 K/uL (ref 4.0–10.5)
nRBC: 0 % (ref 0.0–0.2)

## 2023-10-05 LAB — LACTATE DEHYDROGENASE: LDH: 250 U/L — ABNORMAL HIGH (ref 98–192)

## 2023-10-05 LAB — URIC ACID: Uric Acid, Serum: 9.6 mg/dL — ABNORMAL HIGH (ref 2.5–7.1)

## 2023-10-05 NOTE — Progress Notes (Signed)
 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 differential diagnosis includes CKD related light chain elevation, multiple myeloma or amyloidosis. Chronic kidney disease complicates interpretation. She does have CKD and anemia, but no high clinical suspicion for multiple myeloma  - Recommend bone marrow biopsy to evaluate for abnormal plasma cells and assess for amyloidosis or smoldering multiple myeloma. Procedure is outpatient, requires a companion, and is safer in a hospital setting due to the need for a longer needle and CT guidance. Not urgent, can be scheduled within the next month or longer. - Repeat light chain level test to monitor for changes. - Contact her after August 11 to discuss the decision regarding the biopsy.  Chronic kidney disease Chronic kidney disease of unknown duration, potentially contributing to elevated light chain levels due to impaired secretion.  Peripheral neuropathy, unspecified Intermittent tingling and numbness in the legs, especially when elevated. She is on gabapentin  for symptom management. Possible association with light chain disease or amyloidosis.  Plan - I reviewed her lab work from July 17, which showed significantly elevated kappa light chain in the serum and 24-hour urine, no M protein -I recommend a bone marrow biopsy to rule out multiple myeloma or amyloidosis.  Patient wants to think about it. - Will call patient in 1-2 weeks to see if she is agreeable to proceed bone  marrow biopsy.   Discussed the use of AI scribe software for clinical note transcription with the patient, who gave verbal consent to proceed.  History of Present Illness Jean Stewart is an 78 year old female with chronic kidney disease who presents for follow-up of abnormal light chain levels. She was referred by Dr. Janine for evaluation of abnormal lab results.  Recent lab work shows elevated light chain levels in blood and 24-hour urine tests. She has chronic kidney disease and has consulted a nephrologist twice.  Her medical history includes hypertension managed with Midodrine  and COPD requiring oxygen . She takes Metoprolol , an inhaler, Gabapentin , and a stool softener. She is on a blood thinner for an unspecified reason.  She experiences numbness and tingling in her legs when elevated for extended periods, managed with Gabapentin . No current tingling in hands or feet. She has arthritis but denies gout. No pain in the back, hip, or pelvic areas, though occasional right hip discomfort occurs due to her sitting position in bed.     All other systems were reviewed with the patient and are negative.  MEDICAL HISTORY:  Past Medical History:  Diagnosis Date   A-fib [facility])    Acute encephalopathy    Acute on chronic respiratory failure with hypercapnia (HCC)    Acute on chronic respiratory failure with hypoxia and hypercapnia (HCC) 11/17/2020   Acute respiratory failure with hypoxia and hypercapnia (HCC) 05/11/2018   Arthritis    COPD (chronic obstructive pulmonary disease) (HCC)    Elevated troponin 05/11/2018   Hypertension    Hypoxia 11/17/2020   IBS (irritable bowel syndrome)    Liver failure (HCC)    Morbid obesity (HCC)  Neuropathy    Peripheral vascular disease (HCC)    blood clots in legs   Physical deconditioning    Pre-diabetes    Suspected sleep apnea     SURGICAL HISTORY: Past Surgical History:  Procedure Laterality Date   EXTERNAL FIXATION LEG Left  12/03/2018   Procedure: EXTERNAL FIXATION LEG;  Surgeon: Kendal Franky SQUIBB, MD;  Location: [facility];  Service: Orthopedics;  Laterality: Left;   EXTERNAL FIXATION REMOVAL Left 01/25/2019   Procedure: REMOVAL EXTERNAL FIXATION LEG;  Surgeon: Kendal Franky SQUIBB, MD;  Location: [facility] OR;  Service: Orthopedics;  Laterality: Left;   I & D EXTREMITY Left 12/03/2018   Procedure: IRRIGATION AND DEBRIDEMENT EXTREMITY;  Surgeon: Kendal Franky SQUIBB, MD;  Location: [facility] OR;  Service: Orthopedics;  Laterality: Left;   ORIF ANKLE FRACTURE Left 12/03/2018   Procedure: OPEN REDUCTION INTERNAL FIXATION (ORIF) ANKLE FRACTURE;  Surgeon: Kendal Franky SQUIBB, MD;  Location: [facility] OR;  Service: Orthopedics;  Laterality: Left;    I have reviewed the social history and family history with the patient and they are unchanged from previous note.  ALLERGIES:  has no known allergies.  MEDICATIONS:  Current Outpatient Medications  Medication Sig Dispense Refill   acetaminophen  (TYLENOL ) 325 MG tablet Take 650 mg by mouth every 4 (four) hours as needed for fever. (Patient not taking: Reported on 09/24/2023)     apixaban  (ELIQUIS ) 2.5 MG TABS tablet Take 2 tablets (5 mg total) by mouth 2 (two) times daily.     Cholecalciferol  (D3) 25 MCG (1000 UT) capsule Take 1,000 Units by mouth daily.     gabapentin  (NEURONTIN ) 100 MG capsule Take 100 mg by mouth daily.     ipratropium-albuterol  (DUONEB) 0.5-2.5 (3) MG/3ML SOLN Take 3 mLs by nebulization in the morning, at noon, and at bedtime.     latanoprost  (XALATAN ) 0.005 % ophthalmic solution Place 1 drop into both eyes at bedtime.     [Paused] metoprolol  succinate (TOPROL -XL) 25 MG 24 hr tablet Take 12.5 mg by mouth daily.     midodrine  (PROAMATINE ) 2.5 MG tablet Take 2.5 mg by mouth 3 (three) times daily.     Multiple Vitamins-Iron (MULTIVITAMINS WITH IRON) TABS tablet Take 1 tablet by mouth daily.     sennosides-docusate sodium  (SENOKOT-S) 8.6-50 MG tablet Take 1 tablet by mouth every 12 (twelve) hours.      simethicone (MYLICON) 80 MG chewable tablet Chew 80 mg by mouth daily. For 3 days     [Paused] torsemide (DEMADEX) 20 MG tablet Take 20 mg by mouth 2 (two) times daily.     umeclidinium-vilanterol (ANORO ELLIPTA ) 62.5-25 MCG/ACT AEPB Inhale 1 puff into the lungs daily.     No current facility-administered medications for this visit.    PHYSICAL EXAMINATION: ECOG PERFORMANCE STATUS: 3 - Symptomatic, >50% confined to bed  Vitals:   10/05/23 1057  BP: 136/78  Pulse: 97  Resp: 17  Temp: 97.8 F (36.6 C)  SpO2: 99%   Wt Readings from Last 3 Encounters:  10/05/23 (!) 403 lb (182.8 kg)  09/26/23 (!) 403 lb 11.2 oz (183.1 kg)  11/25/20 (!) 370 lb 6.3 oz (168 kg)     GENERAL:alert, no distress and comfortable, morbidly obese obese female SKIN: skin color, texture, turgor are normal, no rashes or significant lesions Musculoskeletal:no cyanosis of digits and no clubbing  NEURO: alert & oriented x 3 with fluent speech, no focal motor/sensory deficits  Physical Exam    LABORATORY DATA:  I have reviewed the data as  listed    Latest Ref Rng & Units 10/05/2023   10:16 AM 09/26/2023    1:45 AM 09/25/2023    2:20 AM  CBC  WBC 4.0 - 10.5 K/uL 7.5  11.6  13.3   Hemoglobin 12.0 - 15.0 g/dL 89.1  9.9  89.5   Hematocrit 36.0 - 46.0 % 33.7  31.7  33.9   Platelets 150 - 400 K/uL 168  162  160         Latest Ref Rng & Units 10/05/2023   10:16 AM 09/26/2023    1:42 PM 09/26/2023    1:45 AM  CMP  Glucose 70 - 99 mg/dL 98   890   BUN 8 - 23 mg/dL 40   54   Creatinine 9.55 - 1.00 mg/dL 8.09   7.41   Sodium 864 - 145 mmol/L 141   134   Potassium 3.5 - 5.1 mmol/L 4.4  5.4  5.4   Chloride 98 - 111 mmol/L 100   96   CO2 22 - 32 mmol/L 34   29   Calcium 8.9 - 10.3 mg/dL 8.8   8.5   Total Protein 6.5 - 8.1 g/dL 7.1     Total Bilirubin 0.0 - 1.2 mg/dL 0.8     Alkaline Phos 38 - 126 U/L 107     AST 15 - 41 U/L 16     ALT 0 - 44 U/L 14         RADIOGRAPHIC STUDIES: I have personally  reviewed the radiological images as listed and agreed with the findings in the report. No results found.    No orders of the defined types were placed in this encounter.  All questions were answered. The patient knows to call the clinic with any problems, questions or concerns. No barriers to learning was detected. The total time spent in the appointment was 30 minutes, including review of chart and various tests results, discussions about plan of care and coordination of care plan     Onita Mattock, MD 10/05/2023

## 2023-10-06 LAB — KAPPA/LAMBDA LIGHT CHAINS
Kappa free light chain: 194.3 mg/L — ABNORMAL HIGH (ref 3.3–19.4)
Kappa, lambda light chain ratio: 8.45 — ABNORMAL HIGH (ref 0.26–1.65)
Lambda free light chains: 23 mg/L (ref 5.7–26.3)

## 2023-10-06 LAB — BETA 2 MICROGLOBULIN, SERUM: Beta-2 Microglobulin: 5.1 mg/L — ABNORMAL HIGH (ref 0.6–2.4)

## 2023-10-08 LAB — MULTIPLE MYELOMA PANEL, SERUM
Albumin SerPl Elph-Mcnc: 3.3 g/dL (ref 2.9–4.4)
Albumin/Glob SerPl: 1.1 (ref 0.7–1.7)
Alpha 1: 0.3 g/dL (ref 0.0–0.4)
Alpha2 Glob SerPl Elph-Mcnc: 0.7 g/dL (ref 0.4–1.0)
B-Globulin SerPl Elph-Mcnc: 1 g/dL (ref 0.7–1.3)
Gamma Glob SerPl Elph-Mcnc: 1.1 g/dL (ref 0.4–1.8)
Globulin, Total: 3.1 g/dL (ref 2.2–3.9)
IgA: 323 mg/dL (ref 64–422)
IgG (Immunoglobin G), Serum: 1189 mg/dL (ref 586–1602)
IgM (Immunoglobulin M), Srm: 126 mg/dL (ref 26–217)
Total Protein ELP: 6.4 g/dL (ref 6.0–8.5)

## 2023-10-19 ENCOUNTER — Telehealth: Payer: Self-pay

## 2023-10-19 NOTE — Telephone Encounter (Addendum)
 Have tried multiple time to contact patient to see if she has made a decision about having her BM Biopsy per Dr. Donnie request. I spoke to the daughter yesterday she stated to call her mom she was not aware of her decision that her mom is in a rehab facility at this time. Made Dr. Autumn aware he stated to wait until she is discharged from the facility before we move forward with the scheduling of the BM biopsy.    ----- Message from Avinash Pasam sent at 10/16/2023  4:40 PM EDT ----- Thanks for seeing her and for the update Dr. Lanny.  I really appreciate all your help. ----- Message ----- From: Lanny Callander, MD Sent: 10/05/2023   6:32 PM EDT To: Norleen KANDICE Spain, CMA; Chinita Autumn, MD  Chinita,  I saw her today and recommend BM biopsy, she wants to think about it.SABRA Norleen, please call her the week of 8/11 to see if she is agreeable to proceed. If yes, please order IR bone marrow biopsy and f/u with Dr. Autumn 1 week after  Jodie Callander

## 2023-11-28 ENCOUNTER — Ambulatory Visit

## 2023-12-06 ENCOUNTER — Ambulatory Visit

## 2023-12-06 DIAGNOSIS — J9610 Chronic respiratory failure, unspecified whether with hypoxia or hypercapnia: Secondary | ICD-10-CM

## 2024-01-05 ENCOUNTER — Encounter: Payer: Self-pay | Admitting: Internal Medicine

## 2024-01-05 ENCOUNTER — Ambulatory Visit: Attending: Internal Medicine | Admitting: Internal Medicine

## 2024-01-05 VITALS — BP 144/84 | HR 148

## 2024-01-05 DIAGNOSIS — N184 Chronic kidney disease, stage 4 (severe): Secondary | ICD-10-CM

## 2024-01-05 DIAGNOSIS — I10 Essential (primary) hypertension: Secondary | ICD-10-CM | POA: Diagnosis not present

## 2024-01-05 DIAGNOSIS — E662 Morbid (severe) obesity with alveolar hypoventilation: Secondary | ICD-10-CM

## 2024-01-05 DIAGNOSIS — Z6841 Body Mass Index (BMI) 40.0 and over, adult: Secondary | ICD-10-CM

## 2024-01-05 DIAGNOSIS — I4821 Permanent atrial fibrillation: Secondary | ICD-10-CM | POA: Diagnosis not present

## 2024-01-05 DIAGNOSIS — I5042 Chronic combined systolic (congestive) and diastolic (congestive) heart failure: Secondary | ICD-10-CM

## 2024-01-05 DIAGNOSIS — E66813 Obesity, class 3: Secondary | ICD-10-CM | POA: Diagnosis not present

## 2024-01-05 DIAGNOSIS — Z9989 Dependence on other enabling machines and devices: Secondary | ICD-10-CM

## 2024-01-05 DIAGNOSIS — J9610 Chronic respiratory failure, unspecified whether with hypoxia or hypercapnia: Secondary | ICD-10-CM

## 2024-01-05 DIAGNOSIS — I4892 Unspecified atrial flutter: Secondary | ICD-10-CM | POA: Diagnosis not present

## 2024-01-05 MED ORDER — METOPROLOL SUCCINATE ER 25 MG PO TB24: 25.0000 mg | ORAL_TABLET | Freq: Every day | ORAL | Status: AC

## 2024-01-05 NOTE — Progress Notes (Signed)
 Cardiology Office Note   Date:  01/05/2024  ID:  Jean Stewart, Jean Stewart [birth year], MRN [id number] PCP: Patient, No Pcp Per  [facility] Providers Cardiologist:  Emeline FORBES Calender, MD     History of Present Illness Jean Stewart is a 78 y.o. 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  According to prior notes, patient was admitted to the hospital with acute on chronic hypoxic respiratory failure from July 20 through September 26, 2023 and discharged on 2 L of oxygen .  She was started on LAMA/LABA and treated for COPD exacerbation.  She had trace edema in the lower extremities and given one-time dose of Lasix  40 mg IV in the ED but no further diuresis was needed.  Her metoprolol  and torsemide were held due to hypotension and had rate controlled A-fib during the hospitalization and was discharged back to [facility] center for continued skilled therapy.  Currently, she denies any complaints of chest pain, palpitations or worsening shortness of breath from baseline.  No swelling of the legs.  States that her blood pressure is occasionally taken with an electronic wrist cuff and occasionally manually.  She is not sure what medications she is taking.  ROS:  Review of Systems  All other systems reviewed and are negative.   Physical Exam  Physical Exam Vitals and nursing note reviewed.  Constitutional:      Appearance: Normal appearance. She is obese.  HENT:     Head: Normocephalic and atraumatic.  Eyes:     Conjunctiva/sclera: Conjunctivae normal.  Neck:     Vascular: No carotid bruit.  Cardiovascular:      Rate and Rhythm: Tachycardia present. Rhythm irregular.     Comments: Distant heart sounds Pulmonary:     Effort: Pulmonary effort is normal.     Breath sounds: No rales.     Comments: Distant breath sounds Musculoskeletal:        General: No swelling or tenderness.  Skin:    Coloration: Skin is not jaundiced or pale.  Neurological:     Mental Status: She is alert.     VS:  BP (!) 144/84 (BP Location: Left Arm, Patient Position: Sitting, Cuff Size: Large)   Pulse (!) 148   SpO2 97%         Wt Readings from Last 3 Encounters:  10/05/23 (!) 403 lb (182.8 kg)  09/26/23 (!) 403 lb 11.2 oz (183.1 kg)  11/25/20 (!) 370 lb 6.3 oz (168 kg)     EKG Interpretation Date/Time:  Friday January 05 2024 11:14:21 EDT Ventricular Rate:  118 PR Interval:    QRS Duration:  78 QT Interval:  292 QTC Calculation: 409 R Axis:   22  Text Interpretation: Atrial flutter with variable A-V block Low voltage QRS Cannot rule out Anterior infarct (cited on or before 05-Jan-2024) When compared with ECG of 05-Jan-2024 11:13, No significant change since Confirmed by Calender Emeline [phone]) on 01/05/2024 11:25:56 AM    Studies Reviewed   Echocardiogram 11/17/2020:   1. Left ventricular ejection fraction, by estimation, is 55 to 60%. The  left ventricle has normal function. The left ventricle has no regional  wall motion abnormalities. There is mild concentric  left ventricular  hypertrophy. Left ventricular diastolic  parameters are indeterminate. Elevated left ventricular end-diastolic  pressure.   2. Right ventricular systolic function is normal. The right ventricular  size is normal. There is moderately elevated pulmonary artery systolic  pressure.   3. Left atrial size was mildly dilated.   4. Right atrial size was mildly dilated.   5. The mitral valve is normal in structure. Trivial mitral valve  regurgitation. No evidence of mitral stenosis.   6. Tricuspid valve regurgitation is mild to  moderate.   7. The aortic valve is tricuspid. Aortic valve regurgitation is not  visualized. No aortic stenosis is present.   8. Aortic dilatation noted. There is mild dilatation of the ascending  aorta, measuring 41 mm.   9. The inferior vena cava is dilated in size with <50% respiratory  variability, suggesting right atrial pressure of 15 mmHg.      Risk Assessment/Calculations   CHA2DS2-VASc Score = 5   This indicates a 7.2% annual risk of stroke. 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  On metoprolol  succinate 12.5 mg daily for rate control Secondary hypercoagulable state on Eliquis  Reports of hypotension, currently hypertensive on chronic midodrine  2.5 mg.  Hypotensive readings may be due to inaccurate electronic wrist cuff given patient's body habitus versus true hypotension.  She states she feels fine during hypotensive episodes with systolics in the 90s at her facility CKD 4 Super morbid obesity Chronic hypoxic respiratory failure and obesity hypoventilation syndrome on chronic O2 Chronic combined systolic and diastolic heart failure, not in acute exacerbation  Plan  Increase metoprolol  to 25 mg daily for better rate control Echocardiogram Will hold off on cardioverting as patient is asymptomatic, is at high risk of respiratory compromise with anesthesia given her comorbidities and has permanent A-fib Continue Eliquis   Follow up: 3 months          Signed, Emeline FORBES Calender, MD

## 2024-01-05 NOTE — Patient Instructions (Signed)
 Medication Instructions:  Increase metoprolol  succinate to 25 mg by mouth daily  *If you need a refill on your cardiac medications before your next appointment, please call your pharmacy*  Lab Work: none If you have labs (blood work) drawn today and your tests are completely normal, you will receive your results only by: MyChart Message (if you have MyChart) OR A paper copy in the mail If you have any lab test that is abnormal or we need to change your treatment, we will call you to review the results.  Testing/Procedures: Your physician has requested that you have an echocardiogram. Echocardiography is a painless test that uses sound waves to create images of your heart. It provides your doctor with information about the size and shape of your heart and how well your heart's chambers and valves are working. This procedure takes approximately one hour. There are no restrictions for this procedure. Please do NOT wear cologne, perfume, aftershave, or lotions (deodorant is allowed). Please arrive 15 minutes prior to your appointment time.  Please note: We ask at that you not bring children with you during ultrasound (echo/ vascular) testing. Due to room size and safety concerns, children are not allowed in the ultrasound rooms during exams. Our front office staff cannot provide observation of children in our lobby area while testing is being conducted. An adult accompanying a patient to their appointment will only be allowed in the ultrasound room at the discretion of the ultrasound technician under special circumstances. We apologize for any inconvenience.   Follow-Up: At [facility], you and your health needs are our priority.  As part of our continuing mission to provide you with exceptional heart care, our providers are all part of one team.  This team includes your primary Cardiologist (physician) and Advanced Practice Providers or APPs (Physician Assistants and Nurse Practitioners)  who all work together to provide you with the care you need, when you need it.  Your next appointment:   3 month(s)  Provider:   Emeline FORBES Calender, MD    We recommend signing up for the patient portal called MyChart.  Sign up information is provided on this After Visit Summary.  MyChart is used to connect with patients for Virtual Visits (Telemedicine).  Patients are able to view lab/test results, encounter notes, upcoming appointments, etc.  Non-urgent messages can be sent to your provider as well.   To learn more about what you can do with MyChart, go to [email or web address].   Other Instructions

## 2024-01-29 ENCOUNTER — Ambulatory Visit (HOSPITAL_COMMUNITY)
Admission: RE | Admit: 2024-01-29 | Discharge: 2024-01-29 | Disposition: A | Discharge status: Home / Self Care | Source: Ambulatory Visit | Attending: Cardiovascular Disease | Admitting: Cardiovascular Disease

## 2024-01-29 DIAGNOSIS — I10 Essential (primary) hypertension: Secondary | ICD-10-CM | POA: Insufficient documentation

## 2024-01-29 DIAGNOSIS — I4892 Unspecified atrial flutter: Secondary | ICD-10-CM | POA: Diagnosis present

## 2024-01-29 DIAGNOSIS — I4821 Permanent atrial fibrillation: Secondary | ICD-10-CM | POA: Diagnosis not present

## 2024-01-29 LAB — ECHOCARDIOGRAM COMPLETE: S' Lateral: 3.3 cm

## 2024-01-29 MED ORDER — PERFLUTREN LIPID MICROSPHERE
1.0000 mL | INTRAVENOUS | Status: AC | PRN
  Administered 2024-01-29: 3 mL via INTRAVENOUS

## 2024-01-30 ENCOUNTER — Ambulatory Visit: Payer: Self-pay | Admitting: Internal Medicine

## 2024-01-30 DIAGNOSIS — I4891 Unspecified atrial fibrillation: Secondary | ICD-10-CM

## 2024-01-30 DIAGNOSIS — I4821 Permanent atrial fibrillation: Secondary | ICD-10-CM

## 2024-01-30 NOTE — Progress Notes (Signed)
 Echocardiogram shows ejection fraction 55 to 60% with mild LV dilation and septal hypokinesis.  Also with moderately elevated PASP.  Given the wall motion abnormality it is reasonable to pursue an ischemic workup.  -Please schedule the patient for pharmacologic PET/CT  Thank you, Emeline Calender, DO

## 2024-01-31 ENCOUNTER — Other Ambulatory Visit: Payer: Self-pay

## 2024-01-31 NOTE — Telephone Encounter (Signed)
 I called the patient and was told it would be best to contact one of her daughters for results and further testing. I called her daughter and did not receive an answer. I will call again next week to make sure instructions are given before the patient's PET scan.

## 2024-02-26 ENCOUNTER — Telehealth (HOSPITAL_COMMUNITY): Payer: Self-pay | Admitting: Emergency Medicine

## 2024-02-26 ENCOUNTER — Other Ambulatory Visit: Payer: Self-pay | Admitting: Cardiovascular Disease

## 2024-02-26 DIAGNOSIS — R079 Chest pain, unspecified: Secondary | ICD-10-CM

## 2024-02-26 NOTE — Progress Notes (Signed)
 PET consent placed.  Signed, Darryle DASEN. Barbaraann, MD, [facility]  [facility]  [address] [place], [place] [address] [phone]  1:47 PM

## 2024-02-26 NOTE — Telephone Encounter (Signed)
 Unable to leave vm [place] RN Navigator Cardiac Imaging [facility] Services [phone] Office  [phone] Cell

## 2024-02-27 ENCOUNTER — Inpatient Hospital Stay (HOSPITAL_COMMUNITY): Admission: RE | Admit: 2024-02-27

## 2024-04-04 ENCOUNTER — Ambulatory Visit: Admitting: Internal Medicine

## 2024-04-04 ENCOUNTER — Encounter: Payer: Self-pay | Admitting: Internal Medicine

## 2024-04-04 VITALS — BP 121/80 | HR 82 | Ht 65.0 in | Wt >= 6400 oz

## 2024-04-04 DIAGNOSIS — I4821 Permanent atrial fibrillation: Secondary | ICD-10-CM | POA: Diagnosis not present

## 2024-04-04 DIAGNOSIS — Z9989 Dependence on other enabling machines and devices: Secondary | ICD-10-CM

## 2024-04-04 DIAGNOSIS — J9610 Chronic respiratory failure, unspecified whether with hypoxia or hypercapnia: Secondary | ICD-10-CM | POA: Diagnosis not present

## 2024-04-04 DIAGNOSIS — I5042 Chronic combined systolic (congestive) and diastolic (congestive) heart failure: Secondary | ICD-10-CM | POA: Diagnosis not present

## 2024-04-04 DIAGNOSIS — I4892 Unspecified atrial flutter: Secondary | ICD-10-CM

## 2024-04-04 DIAGNOSIS — I10 Essential (primary) hypertension: Secondary | ICD-10-CM | POA: Diagnosis not present

## 2024-04-04 NOTE — Patient Instructions (Signed)
 Medication Instructions:  NO CHANGES  *If you need a refill on your cardiac medications before your next appointment, please call your pharmacy*  Testing/Procedures: Cardiac PET stress test at [facility]  Follow-Up: At [facility], you and your health needs are our priority.  As part of our continuing mission to provide you with exceptional heart care, our providers are all part of one team.  This team includes your primary Cardiologist (physician) and Advanced Practice Providers or APPs (Physician Assistants and Nurse Practitioners) who all work together to provide you with the care you need, when you need it.  Your next appointment:    6 months with Dr. Kriste    We recommend signing up for the patient portal called MyChart.  Sign up information is provided on this After Visit Summary.  MyChart is used to connect with patients for Virtual Visits (Telemedicine).  Patients are able to view lab/test results, encounter notes, upcoming appointments, etc.  Non-urgent messages can be sent to your provider as well.   To learn more about what you can do with MyChart, go to [email or web address].   Other Instructions     Please report to Radiology at the [facility] Main Entrance 30 minutes early for your test.  [address] [place], [place] [address]                         OR   Please report to Radiology at [facility] Main Entrance, medical mall, 30 mins prior to your test.  [address]  [place], [place]  How to Prepare for Your Cardiac PET/CT Stress Test:  Nothing to eat or drink, except water, 3 hours prior to arrival time.  NO caffeine/decaffeinated products, or chocolate 12 hours prior to arrival. (Please note decaffeinated beverages (teas/coffees) still contain caffeine).  If you have caffeine within 12 hours prior, the test will need to be rescheduled.  Medication instructions: Do not take erectile dysfunction  medications for 72 hours prior to test (sildenafil, tadalafil) Do not take nitrates (isosorbide mononitrate, Ranexa) the day before or day of test Do not take tamsulosin the day before or morning of test Hold theophylline containing medications for 12 hours. Hold Dipyridamole 48 hours prior to the test.  Diabetic Preparation: If able to eat breakfast prior to 3 hour fasting, you may take all medications, including your insulin. Do not worry if you miss your breakfast dose of insulin - start at your next meal. If you do not eat prior to 3 hour fast-Hold all diabetes (oral and insulin) medications. Patients who wear a continuous glucose monitor MUST remove the device prior to scanning.  You may take your remaining medications with water.  NO perfume, cologne or lotion on chest or abdomen area. FEMALES - Please avoid wearing dresses to this appointment.  Total time is 1 to 2 hours; you may want to bring reading material for the waiting time.  IF YOU THINK YOU MAY BE PREGNANT, OR ARE NURSING PLEASE INFORM THE TECHNOLOGIST.  In preparation for your appointment, medication and supplies will be purchased.  Appointment availability is limited, so if you need to cancel or reschedule, please call the Radiology Department Scheduler at [phone] 24 hours in advance to avoid a cancellation fee of $100.00  What to Expect When you Arrive:  Once you arrive and check in for your appointment, you will be taken to a preparation room within the Radiology Department.  A technologist or Nurse will obtain your medical history, verify that you are correctly prepped for the exam, and explain the procedure.  Afterwards, an IV will be started in your arm and electrodes will be placed on your skin for EKG monitoring during the stress portion of the exam. Then you will be escorted to the PET/CT scanner.  There, staff will get you positioned on the scanner and obtain a blood pressure and EKG.  During the exam, you  will continue to be connected to the EKG and blood pressure machines.  A small, safe amount of a radioactive tracer will be injected in your IV to obtain a series of pictures of your heart along with an injection of a stress agent.    After your Exam:  It is recommended that you eat a meal and drink a caffeinated beverage to counter act any effects of the stress agent.  Drink plenty of fluids for the remainder of the day and urinate frequently for the first couple of hours after the exam.  Your doctor will inform you of your test results within 7-10 business days.  For more information and frequently asked questions, please visit our website: [email or web address]  For questions about your test or how to prepare for your test, please call: Cardiac Imaging Nurse Navigators Office: [phone]  For billing questions, please call [phone].

## 2024-04-04 NOTE — Progress Notes (Signed)
 " Cardiology Office Note:  .   Date:  04/04/2024  ID:  Jean Stewart, DOB [birth year], MRN [id number] PCP: Patient, No Pcp Per  [facility] Providers Cardiologist:  Emeline FORBES Calender, DO    History of Present Illness: .     Discussed the use of AI scribe software for clinical note transcription with the patient, who gave verbal consent to proceed.  History of Present Illness Jean Stewart is an 79 year old female with heart failure and atrial fibrillation who presents for follow-up of her cardiovascular conditions. She was initially referred by her PCP for hypotension.  Heart failure and hypotension - Systolic and diastolic heart failure with chronic management. - Hospitalized in July for acute on chronic hypoxic respiratory failure; metoprolol  and torsemide were held due to hypotension. - Currently taking metoprolol  succinate 25 mg and torsemide. - On chronic midodrine  therapy for hypotension. - No changes in symptoms since last visit. - No chest pain or palpitations.  Atrial fibrillation and atrial flutter - History of atrial fibrillation and atrial flutter with rapid ventricular response and variable block. - Currently taking Eliquis  5 mg twice daily for anticoagulation. - No symptoms of heart racing or palpitations.  Chronic hypoxic respiratory failure and obesity hypoventilation syndrome - Chronic hypoxic respiratory failure and obesity hypoventilation syndrome. - On chronic oxygen  therapy. - Experiences wheezing, attributed to not using oxygen  in the morning. - Uses DuoNebs via nebulizer at night and sleeps with a machine. - Uses Anoro Ellipta  inhaler every morning before eating.  Electrolyte abnormalities - Recent laboratory work showed elevated potassium levels last week; results not available during visit.  Recent diagnostic testing - Recent echocardiogram performed.          ROS: Remaining review of systems negative  Studies Reviewed: .         Results Diagnostic Echocardiogram: Normal ejection fraction, mild left ventricular dilatation, septal hypokinesis, moderately elevated pulmonary artery systolic pressure Risk Assessment/Calculations:    CHA2DS2-VASc Score = 5   This indicates a 7.2% annual risk of stroke. The patient's score is based upon: CHF History: 1 HTN History: 1 Diabetes History: 0 Stroke History: 0 Vascular Disease History: 0 Age Score: 2 Gender Score: 1            Physical Exam:   VS:  BP 121/80   Pulse 82   Ht 5' 5 (1.651 m)   Wt (!) 411 lb (186.4 kg)   SpO2 98% Comment: 2L O2  BMI 68.39 kg/m    Wt Readings from Last 3 Encounters:  04/04/24 (!) 411 lb (186.4 kg)  10/05/23 (!) 403 lb (182.8 kg)  09/26/23 (!) 403 lb 11.2 oz (183.1 kg)    GEN: Well nourished, well developed in no acute distress.  Obesity NECK: No JVD; No carotid bruits CARDIAC: Irregularly irregular, no murmurs, no rubs, no gallops RESPIRATORY:  Clear to auscultation.  Mild bibasilar rales wheeze  ABDOMEN: Soft, non-tender, non-distended EXTREMITIES:  No edema; No deformity   ASSESSMENT AND PLAN: .    Assessment and Plan Assessment & Plan Permanent atrial fibrillation with rate control and recent atrial flutter Asymptomatic with controlled heart rate. Rate control strategy preferred due to high risk of respiratory compromise and anesthesia considerations if patient required cardioversion.  Echocardiogram showed preserved ejection fraction with septal hypokinesis - Continue Eliquis  5 mg twice daily for now until we obtain her renal function.  If creatinine is greater than 1.5 we will decrease Eliquis  to 2.5 mg twice daily as she  is 79 years old. - Continue metoprolol  succinate 25 mg daily. - Will schedule PET CT for ischemic assessment which was previously ordered but not scheduled.  Heart failure with preserved ejection fraction and mild LV dilatation Echocardiogram showed normal ejection fraction with mild LV dilatation  and septal hypokinesis. Moderately elevated pulmonary artery systolic pressure noted. - Continue metoprolol  succinate and torsemide 20 mg twice daily - Further GDMT advancement limited due to hypotension  Chronic hypoxic respiratory failure due to super morbid obesity and hypoventilation syndrome Chronic hypoxic respiratory failure secondary to super morbid obesity and hypoventilation syndrome. Recent hospitalization for acute on chronic hypoxic respiratory failure. - Continue DuoNebs nebulizer at night. - Continue Anoro Ellipta  inhaler every morning.  Hypertension with history of hypotension Currently on midodrine   Chronic kidney disease Recent lab work indicated elevated potassium levels, but results are not available for review. - Will obtain recent lab results to assess kidney function and adjust Eliquis  dosage if necessary.  Monoclonal gammopathy of renal significance  Peripheral neuropathy  Anemia of chronic disease       Informed Consent   Shared Decision Making/Informed Consent The risks [chest pain, shortness of breath, cardiac arrhythmias, dizziness, blood pressure fluctuations, myocardial infarction, stroke/transient ischemic attack, nausea, vomiting, allergic reaction, radiation exposure, metallic taste sensation and life-threatening complications (estimated to be 1 in 10,000)], benefits (risk stratification, diagnosing coronary artery disease, treatment guidance) and alternatives of a cardiac PET stress test were discussed in detail with Jean Stewart and she agrees to proceed.       Follow up: 6 mo  Signed, Emeline FORBES Calender, DO  04/04/2024 1:31 PM    [facility] "
